# Patient Record
Sex: Female | Born: 1975 | Race: White | Hispanic: No | Marital: Single | Smoking: Never smoker
Health system: Southern US, Community
[De-identification: ages and names within clinical notes are randomized; demographics above are authoritative.]

## PROBLEM LIST (undated history)

## (undated) DIAGNOSIS — G43019 Migraine without aura, intractable, without status migrainosus: Secondary | ICD-10-CM

## (undated) DIAGNOSIS — F329 Major depressive disorder, single episode, unspecified: Secondary | ICD-10-CM

## (undated) DIAGNOSIS — G43909 Migraine, unspecified, not intractable, without status migrainosus: Secondary | ICD-10-CM

## (undated) DIAGNOSIS — E109 Type 1 diabetes mellitus without complications: Secondary | ICD-10-CM

## (undated) DIAGNOSIS — F32A Depression, unspecified: Secondary | ICD-10-CM

## (undated) DIAGNOSIS — N83209 Unspecified ovarian cyst, unspecified side: Secondary | ICD-10-CM

## (undated) DIAGNOSIS — A64 Unspecified sexually transmitted disease: Secondary | ICD-10-CM

## (undated) DIAGNOSIS — F419 Anxiety disorder, unspecified: Secondary | ICD-10-CM

## (undated) DIAGNOSIS — Z789 Other specified health status: Secondary | ICD-10-CM

## (undated) DIAGNOSIS — A419 Sepsis, unspecified organism: Secondary | ICD-10-CM

## (undated) DIAGNOSIS — G44001 Cluster headache syndrome, unspecified, intractable: Secondary | ICD-10-CM

## (undated) DIAGNOSIS — Z0282 Encounter for adoption services: Secondary | ICD-10-CM

## (undated) HISTORY — DX: Unspecified sexually transmitted disease: A64

## (undated) HISTORY — PX: OVARIAN CYST REMOVAL: SHX89

## (undated) HISTORY — DX: Migraine without aura, intractable, without status migrainosus: G43.019

## (undated) HISTORY — PX: BUNIONECTOMY: SHX129

## (undated) HISTORY — PX: EYE SURGERY: SHX253

## (undated) HISTORY — PX: CHOLECYSTECTOMY: SHX55

## (undated) HISTORY — PX: APPENDECTOMY: SHX54

---

## 1999-06-23 ENCOUNTER — Encounter: Admission: RE | Admit: 1999-06-23 | Discharge: 1999-09-21 | Payer: Self-pay | Admitting: Endocrinology

## 2001-08-26 ENCOUNTER — Emergency Department (HOSPITAL_COMMUNITY): Admission: EM | Admit: 2001-08-26 | Discharge: 2001-08-26 | Payer: Self-pay | Admitting: *Deleted

## 2005-09-22 ENCOUNTER — Emergency Department (HOSPITAL_COMMUNITY): Admission: EM | Admit: 2005-09-22 | Discharge: 2005-09-23 | Payer: Self-pay | Admitting: Emergency Medicine

## 2006-10-24 ENCOUNTER — Emergency Department (HOSPITAL_COMMUNITY): Admission: EM | Admit: 2006-10-24 | Discharge: 2006-10-24 | Payer: Self-pay | Admitting: Emergency Medicine

## 2006-10-28 ENCOUNTER — Inpatient Hospital Stay (HOSPITAL_COMMUNITY): Admission: AD | Admit: 2006-10-28 | Discharge: 2006-10-30 | Payer: Self-pay | Admitting: Gastroenterology

## 2011-11-30 DIAGNOSIS — R1031 Right lower quadrant pain: Secondary | ICD-10-CM | POA: Insufficient documentation

## 2011-12-17 ENCOUNTER — Emergency Department (HOSPITAL_COMMUNITY)
Admission: EM | Admit: 2011-12-17 | Discharge: 2011-12-18 | Disposition: A | Discharge status: Home / Self Care | Payer: Self-pay | Attending: Emergency Medicine | Admitting: Emergency Medicine

## 2011-12-17 ENCOUNTER — Emergency Department (HOSPITAL_COMMUNITY): Payer: Self-pay

## 2011-12-17 ENCOUNTER — Encounter (HOSPITAL_COMMUNITY): Payer: Self-pay | Admitting: *Deleted

## 2011-12-17 DIAGNOSIS — R10819 Abdominal tenderness, unspecified site: Secondary | ICD-10-CM | POA: Insufficient documentation

## 2011-12-17 DIAGNOSIS — Z794 Long term (current) use of insulin: Secondary | ICD-10-CM | POA: Insufficient documentation

## 2011-12-17 DIAGNOSIS — E119 Type 2 diabetes mellitus without complications: Secondary | ICD-10-CM | POA: Insufficient documentation

## 2011-12-17 DIAGNOSIS — R109 Unspecified abdominal pain: Secondary | ICD-10-CM | POA: Insufficient documentation

## 2011-12-17 DIAGNOSIS — N949 Unspecified condition associated with female genital organs and menstrual cycle: Secondary | ICD-10-CM | POA: Insufficient documentation

## 2011-12-17 HISTORY — DX: Major depressive disorder, single episode, unspecified: F32.9

## 2011-12-17 HISTORY — DX: Depression, unspecified: F32.A

## 2011-12-17 HISTORY — DX: Unspecified ovarian cyst, unspecified side: N83.209

## 2011-12-17 LAB — URINALYSIS, ROUTINE W REFLEX MICROSCOPIC
Nitrite: NEGATIVE
Specific Gravity, Urine: >1.046 — ABNORMAL HIGH (ref 1.005–1.030)
Urobilinogen, UA: 0.2 mg/dL (ref 0.0–1.0)

## 2011-12-17 LAB — CBC
MCH: 29.2 pg (ref 26.0–34.0)
Platelets: 387 10*3/uL (ref 150–400)
RBC: 4.9 MIL/uL (ref 3.87–5.11)
RDW: 13.1 % (ref 11.5–15.5)
WBC: 6.2 10*3/uL (ref 4.0–10.5)

## 2011-12-17 LAB — COMPREHENSIVE METABOLIC PANEL
ALT: 9 U/L (ref 0–35)
AST: 9 U/L (ref 0–37)
Albumin: 3.8 g/dL (ref 3.5–5.2)
CO2: 21 mEq/L (ref 19–32)
Calcium: 9.6 mg/dL (ref 8.4–10.5)
Chloride: 100 mEq/L (ref 96–112)
GFR calc non Af Amer: >90 mL/min (ref >90)
Sodium: 133 mEq/L — ABNORMAL LOW (ref 135–145)

## 2011-12-17 LAB — URINE MICROSCOPIC-ADD ON

## 2011-12-17 LAB — GLUCOSE, CAPILLARY: Glucose-Capillary: 359 mg/dL — ABNORMAL HIGH (ref 70–99)

## 2011-12-17 MED ORDER — MORPHINE SULFATE 4 MG/ML IJ SOLN
4.0000 mg | Freq: Once | INTRAMUSCULAR | Status: AC
  Administered 2011-12-17: 4 mg via INTRAVENOUS
  Filled 2011-12-17: qty 1

## 2011-12-17 MED ORDER — MORPHINE SULFATE 4 MG/ML IJ SOLN
4.0000 mg | Freq: Once | INTRAMUSCULAR | Status: AC
  Administered 2011-12-18: 4 mg via INTRAVENOUS
  Filled 2011-12-17: qty 1

## 2011-12-17 MED ORDER — SODIUM CHLORIDE 0.9 % IV BOLUS (SEPSIS)
500.0000 mL | Freq: Once | INTRAVENOUS | Status: AC
  Administered 2011-12-17: 500 mL via INTRAVENOUS

## 2011-12-17 MED ORDER — ONDANSETRON HCL 4 MG/2ML IJ SOLN
4.0000 mg | Freq: Once | INTRAMUSCULAR | Status: AC
  Administered 2011-12-17: 4 mg via INTRAVENOUS
  Filled 2011-12-17: qty 2

## 2011-12-17 NOTE — ED Notes (Signed)
Pt reports lower abd pain which started in May.  Reports being seen in [place] for same a week ago and was told that she had R ovarian cyst.  Pt reports pain started to move to the L last night.  Pt also reports nausea and very small amount of pinkish red vaginal bleeding.

## 2011-12-17 NOTE — ED Notes (Signed)
Pt stated she is a diabetic. Checked glucose, 359

## 2011-12-17 NOTE — ED Provider Notes (Signed)
History     CSN: [id number]  Arrival date & time 12/17/11  1621   First MD Initiated Contact with Patient 12/17/11 2049      Chief Complaint  Patient presents with  . Abdominal Pain    (Consider location/radiation/quality/duration/timing/severity/associated sxs/prior treatment) HPI  Patient presents to the ED with complaints of lower abdominal pain for which she has been seen multiple times for at new [facility] in [place]. Her pain has been persistent since May and she has been seen numerous time for this. I received her medical chart from the hospital which shows me she has had Ultrasounds and CT of abd/pelvis. Showing a possible ovarian cyst but no other acute abnormality that would cause patients pain. The chart also shows patient may have possible endometriosis. She denies her pain being different than her norm. She denies having diarrhea, nausea, vomiting. She does not have an appendix. She is afebriles and hemodynamically stable. The patient informs me that she is out of her pain medication therefore, her pain has been worse.  Past Medical History  Diagnosis Date  . Ovarian cyst   . Diabetes mellitus   . Depression     Past Surgical History  Procedure Date  . Appendectomy   . Cholecystectomy     No family history on file.  History  Substance Use Topics  . Smoking status: Never Smoker   . Smokeless tobacco: Not on file  . Alcohol Use: No    OB History    Grav Para Term Preterm Abortions TAB SAB Ect Mult Living                  Review of Systems   HEENT: denies blurry vision or change in hearing PULMONARY: Denies difficulty breathing and SOB CARDIAC: denies chest pain or heart palpitations MUSCULOSKELETAL:  denies being unable to ambulate ABDOMEN AL: denies N,V,D GU: denies loss of bowel or urinary control NEURO: denies numbness and tingling in extremities SKIN: no new rashes PSYCH: patient behavior is normal NECK: Not  complaining of neck pain     Allergies  Sulfa antibiotics; Wellbutrin; Amoxicillin; and Codeine  Home Medications   Current Outpatient Rx  Name Route Sig Dispense Refill  . INSULIN GLARGINE 100 UNIT/ML [place] SOLN Subcutaneous Inject 25 Units into the skin every morning.    . INSULIN LISPRO (HUMAN) 100 UNIT/ML [place] SOLN Subcutaneous Inject into the skin 3 (three) times daily before meals. Sliding scale.    Marland Kitchen SERTRALINE HCL 100 MG PO TABS Oral Take 100 mg by mouth daily.    Marland Kitchen ZOLPIDEM TARTRATE 10 MG PO TABS Oral Take 10 mg by mouth at bedtime as needed.    Marland Kitchen HYDROCODONE-ACETAMINOPHEN 5-500 MG PO TABS Oral Take 1 tablet by mouth every 6 (six) hours as needed for pain. 10 tablet 0  . ONDANSETRON HCL 4 MG PO TABS Oral Take 1 tablet (4 mg total) by mouth every 6 (six) hours. 12 tablet 0    BP 126/84  Pulse 108  Temp 98.7 F (37.1 C) (Oral)  Resp 20  SpO2 99%  LMP 12/03/2011  Physical Exam  Nursing note and vitals reviewed. Constitutional: She appears well-developed and well-nourished. No distress.  HENT:  Head: Normocephalic and atraumatic.  Eyes: Pupils are equal, round, and reactive to light.  Neck: Normal range of motion. Neck supple.  Cardiovascular: Normal rate and regular rhythm.   Pulmonary/Chest: Effort normal. No respiratory distress. She has no wheezes.  Abdominal: Soft. She exhibits  no distension. There is tenderness (bilateral lower quadrants). There is no rebound and no guarding.  Neurological: She is alert.  Skin: Skin is warm and dry.    ED Course  Procedures (including critical care time)  Labs Reviewed  URINALYSIS, ROUTINE W REFLEX MICROSCOPIC - Abnormal; Notable for the following:    APPearance CLOUDY (*)     Specific Gravity, Urine >1.046 (*)     Glucose, UA >1000 (*)     Ketones, ur TRACE (*)     All other components within normal limits  COMPREHENSIVE METABOLIC PANEL - Abnormal; Notable for the following:    Sodium 133 (*)     Glucose, Bld 304 (*)      All other components within normal limits  URINE MICROSCOPIC-ADD ON - Abnormal; Notable for the following:    Squamous Epithelial / LPF MANY (*)     Bacteria, UA FEW (*)     All other components within normal limits  GLUCOSE, CAPILLARY - Abnormal; Notable for the following:    Glucose-Capillary 359 (*)     All other components within normal limits  CBC  POCT PREGNANCY, URINE   US Transvaginal Non-ob  12/17/2011  *RADIOLOGY REPORT*  Clinical Data:  Severe pelvic pain.  Clinical suspicion for ovarian torsion.  LMP 12/05/2011.  TRANSABDOMINAL AND TRANSVAGINAL ULTRASOUND OF PELVIS DOPPLER ULTRASOUND OF OVARIES  Technique:  Both transabdominal and transvaginal ultrasound examinations of the pelvis were performed. Transabdominal technique was performed for global imaging of the pelvis including uterus, ovaries, adnexal regions, and pelvic cul-de-sac.  It was necessary to proceed with endovaginal exam following the transabdominal exam to visualize the endometrium and ovaries.  Color and duplex Doppler ultrasound was utilized to evaluate blood flow to the ovaries.  Comparison:  None.  Findings:  Uterus:  7.6 x 2.9 x 4.3 cm.  No fibroids or other uterine mass identified.  Endometrium:  Double layer endometrial thickness measures 8 mm transvaginally.  No focal lesion visualized.  Right ovary: 2.9 x 1.9 x 2.1 cm.  Normal appearance.  Left ovary: 4.1 x 1.6 x 2.5 cm.  Normal appearance.  Pulsed Doppler evaluation demonstrates normal low-resistance arterial and venous waveforms in both ovaries.  IMPRESSION: Normal exam.  No evidence of pelvic mass or other significant abnormality.  No sonographic evidence for ovarian torsion.  Original Report Authenticated By: Danae Orleans, M.D.   US Pelvis Complete  12/17/2011  *RADIOLOGY REPORT*  Clinical Data:  Severe pelvic pain.  Clinical suspicion for ovarian torsion.  LMP 12/05/2011.  TRANSABDOMINAL AND TRANSVAGINAL ULTRASOUND OF PELVIS DOPPLER ULTRASOUND OF OVARIES   Technique:  Both transabdominal and transvaginal ultrasound examinations of the pelvis were performed. Transabdominal technique was performed for global imaging of the pelvis including uterus, ovaries, adnexal regions, and pelvic cul-de-sac.  It was necessary to proceed with endovaginal exam following the transabdominal exam to visualize the endometrium and ovaries.  Color and duplex Doppler ultrasound was utilized to evaluate blood flow to the ovaries.  Comparison:  None.  Findings:  Uterus:  7.6 x 2.9 x 4.3 cm.  No fibroids or other uterine mass identified.  Endometrium:  Double layer endometrial thickness measures 8 mm transvaginally.  No focal lesion visualized.  Right ovary: 2.9 x 1.9 x 2.1 cm.  Normal appearance.  Left ovary: 4.1 x 1.6 x 2.5 cm.  Normal appearance.  Pulsed Doppler evaluation demonstrates normal low-resistance arterial and venous waveforms in both ovaries.  IMPRESSION: Normal exam.  No evidence of pelvic mass or other  significant abnormality.  No sonographic evidence for ovarian torsion.  Original Report Authenticated By: Danae Orleans, M.D.   [place] Art/ven Flow Abd Pelv Doppler  12/17/2011  *RADIOLOGY REPORT*  Clinical Data:  Severe pelvic pain.  Clinical suspicion for ovarian torsion.  LMP 12/05/2011.  TRANSABDOMINAL AND TRANSVAGINAL ULTRASOUND OF PELVIS DOPPLER ULTRASOUND OF OVARIES  Technique:  Both transabdominal and transvaginal ultrasound examinations of the pelvis were performed. Transabdominal technique was performed for global imaging of the pelvis including uterus, ovaries, adnexal regions, and pelvic cul-de-sac.  It was necessary to proceed with endovaginal exam following the transabdominal exam to visualize the endometrium and ovaries.  Color and duplex Doppler ultrasound was utilized to evaluate blood flow to the ovaries.  Comparison:  None.  Findings:  Uterus:  7.6 x 2.9 x 4.3 cm.  No fibroids or other uterine mass identified.  Endometrium:  Double layer endometrial thickness  measures 8 mm transvaginally.  No focal lesion visualized.  Right ovary: 2.9 x 1.9 x 2.1 cm.  Normal appearance.  Left ovary: 4.1 x 1.6 x 2.5 cm.  Normal appearance.  Pulsed Doppler evaluation demonstrates normal low-resistance arterial and venous waveforms in both ovaries.  IMPRESSION: Normal exam.  No evidence of pelvic mass or other significant abnormality.  No sonographic evidence for ovarian torsion.  Original Report Authenticated By: Danae Orleans, M.D.     1. Abdominal pain       MDM  Patients pain easily managed with 4mg  IV morphine IV. Patients ultasound today shows no cysts, no ovarian torsion. The patients pregnancy test is negative. GC chlamydia and wet prep not done because the hospital records sent to me show that her cultures are negative.  Pt has not had any nausea or vomiting prior to discharge. At re-evaluation of her abdomen she continues to be pain free and only mildly tender. Pt will be given Pain medications and antinausea medications as well as a referral to a gynecologist for her possible endometriosis.  I briefly discussed patient with Dr. Fonnie Jarvis who agrees with my plain.  Pt has been advised of the symptoms that warrant their return to the ED. Patient has voiced understanding and has agreed to follow-up with the PCP or specialist.          Dorthula Matas, PA 12/18/11 [phone]

## 2011-12-18 MED ORDER — ONDANSETRON HCL 4 MG PO TABS: 4.0000 mg | ORAL_TABLET | Freq: Four times a day (QID) | ORAL | Status: DC

## 2011-12-18 MED ORDER — ONDANSETRON HCL 4 MG PO TABS: 4.0000 mg | ORAL_TABLET | Freq: Four times a day (QID) | ORAL | Status: AC

## 2011-12-18 MED ORDER — HYDROCODONE-ACETAMINOPHEN 5-500 MG PO TABS: 1.0000 | ORAL_TABLET | Freq: Four times a day (QID) | ORAL | Status: DC | PRN

## 2011-12-18 NOTE — ED Provider Notes (Signed)
Medical screening examination/treatment/procedure(s) were performed by non-physician practitioner and as supervising physician I was immediately available for consultation/collaboration.   Hurman Horn, MD 12/18/11 [phone]

## 2011-12-29 ENCOUNTER — Emergency Department (HOSPITAL_COMMUNITY): Payer: Self-pay

## 2011-12-29 ENCOUNTER — Encounter (HOSPITAL_COMMUNITY): Payer: Self-pay | Admitting: Emergency Medicine

## 2011-12-29 ENCOUNTER — Emergency Department (HOSPITAL_COMMUNITY)
Admission: EM | Admit: 2011-12-29 | Discharge: 2011-12-29 | Disposition: A | Discharge status: Home / Self Care | Payer: Self-pay | Attending: Emergency Medicine | Admitting: Emergency Medicine

## 2011-12-29 DIAGNOSIS — R110 Nausea: Secondary | ICD-10-CM | POA: Insufficient documentation

## 2011-12-29 DIAGNOSIS — Z794 Long term (current) use of insulin: Secondary | ICD-10-CM | POA: Insufficient documentation

## 2011-12-29 DIAGNOSIS — F3289 Other specified depressive episodes: Secondary | ICD-10-CM | POA: Insufficient documentation

## 2011-12-29 DIAGNOSIS — R739 Hyperglycemia, unspecified: Secondary | ICD-10-CM

## 2011-12-29 DIAGNOSIS — E1169 Type 2 diabetes mellitus with other specified complication: Secondary | ICD-10-CM | POA: Insufficient documentation

## 2011-12-29 DIAGNOSIS — F329 Major depressive disorder, single episode, unspecified: Secondary | ICD-10-CM | POA: Insufficient documentation

## 2011-12-29 DIAGNOSIS — R0602 Shortness of breath: Secondary | ICD-10-CM | POA: Insufficient documentation

## 2011-12-29 LAB — CBC WITH DIFFERENTIAL/PLATELET
Basophils Absolute: 0 10*3/uL (ref 0.0–0.1)
Basophils Relative: 0 % (ref 0–1)
Eosinophils Absolute: 0.1 10*3/uL (ref 0.0–0.7)
MCH: 28.9 pg (ref 26.0–34.0)
MCHC: 35 g/dL (ref 30.0–36.0)
Neutro Abs: 3.5 10*3/uL (ref 1.7–7.7)
Neutrophils Relative %: 52 % (ref 43–77)
RDW: 13.1 % (ref 11.5–15.5)

## 2011-12-29 LAB — GLUCOSE, CAPILLARY: Glucose-Capillary: 221 mg/dL — ABNORMAL HIGH (ref 70–99)

## 2011-12-29 LAB — COMPREHENSIVE METABOLIC PANEL
ALT: 10 U/L (ref 0–35)
AST: 10 U/L (ref 0–37)
Calcium: 9.5 mg/dL (ref 8.4–10.5)
Creatinine, Ser: 0.47 mg/dL — ABNORMAL LOW (ref 0.50–1.10)
GFR calc Af Amer: >90 mL/min (ref >90)
GFR calc non Af Amer: >90 mL/min (ref >90)
Glucose, Bld: 118 mg/dL — ABNORMAL HIGH (ref 70–99)
Sodium: 133 mEq/L — ABNORMAL LOW (ref 135–145)
Total Protein: 7.5 g/dL (ref 6.0–8.3)

## 2011-12-29 LAB — URINALYSIS, ROUTINE W REFLEX MICROSCOPIC
Bilirubin Urine: NEGATIVE
Ketones, ur: 15 mg/dL — AB
Leukocytes, UA: NEGATIVE
Nitrite: NEGATIVE
Protein, ur: NEGATIVE mg/dL
Urobilinogen, UA: 0.2 mg/dL (ref 0.0–1.0)

## 2011-12-29 LAB — BLOOD GAS, ARTERIAL
Acid-base deficit: 5.2 mmol/L — ABNORMAL HIGH (ref 0.0–2.0)
FIO2: 0.28 %
Patient temperature: 98.6
TCO2: 16.7 mmol/L (ref 0–100)
pCO2 arterial: 33 mmHg — ABNORMAL LOW (ref 35.0–45.0)

## 2011-12-29 LAB — URINE MICROSCOPIC-ADD ON

## 2011-12-29 LAB — PREGNANCY, URINE: Preg Test, Ur: NEGATIVE

## 2011-12-29 MED ORDER — IOHEXOL 350 MG/ML SOLN
80.0000 mL | Freq: Once | INTRAVENOUS | Status: AC | PRN
Start: 2011-12-29 — End: 2011-12-29
  Administered 2011-12-29: 80 mL via INTRAVENOUS

## 2011-12-29 MED ORDER — IOHEXOL 300 MG/ML  SOLN: 80.0000 mL | Freq: Once | INTRAMUSCULAR | Status: DC | PRN

## 2011-12-29 MED ORDER — SODIUM CHLORIDE 0.9 % IV BOLUS (SEPSIS): 1000.0000 mL | Freq: Once | INTRAVENOUS | Status: DC

## 2011-12-29 MED ORDER — ONDANSETRON HCL 4 MG/2ML IJ SOLN
4.0000 mg | Freq: Once | INTRAMUSCULAR | Status: AC
  Administered 2011-12-29: 4 mg via INTRAVENOUS
  Filled 2011-12-29: qty 2

## 2011-12-29 MED ORDER — LORAZEPAM 2 MG/ML IJ SOLN
1.0000 mg | Freq: Once | INTRAMUSCULAR | Status: AC
  Administered 2011-12-29: 1 mg via INTRAVENOUS
  Filled 2011-12-29: qty 1

## 2011-12-29 MED ORDER — SODIUM CHLORIDE 0.9 % IV SOLN: Freq: Once | INTRAVENOUS | Status: DC

## 2011-12-29 MED ORDER — SODIUM CHLORIDE 0.9 % IV BOLUS (SEPSIS)
1000.0000 mL | Freq: Once | INTRAVENOUS | Status: AC
  Administered 2011-12-29: 1000 mL via INTRAVENOUS

## 2011-12-29 NOTE — ED Notes (Signed)
O2 removed

## 2011-12-29 NOTE — ED Provider Notes (Cosign Needed Addendum)
History     CSN: [id number]  Arrival date & time 12/29/11  6387   First MD Initiated Contact with Patient 12/29/11 [phone]      Chief Complaint  Patient presents with  . Shortness of Breath  . Nausea  . Hyperglycemia    (Consider location/radiation/quality/duration/timing/severity/associated sxs/prior treatment) Patient is a 36 y.o. female presenting with shortness of breath. The history is provided by the patient.  Shortness of Breath  Associated symptoms include shortness of breath.   patient here with nausea and vomiting and high blood sugar similar to what she's had DKA before in the past. Denies any fever, cough, shortness of breath. Notes compliance with her diabetic medications however she has not been checking her blood sugar because she doesn't have a machine. Denies any diarrhea or abdominal pain or chest pain but does note palpitations. No pleuritic chest pain noted. Nothing makes her symptoms better worse  Past Medical History  Diagnosis Date  . Ovarian cyst   . Diabetes mellitus   . Depression     Past Surgical History  Procedure Date  . Appendectomy   . Cholecystectomy     History reviewed. No pertinent family history.  History  Substance Use Topics  . Smoking status: Never Smoker   . Smokeless tobacco: Not on file  . Alcohol Use: No    OB History    Grav Para Term Preterm Abortions TAB SAB Ect Mult Living                  Review of Systems  Respiratory: Positive for shortness of breath.   All other systems reviewed and are negative.    Allergies  Sulfa antibiotics; Wellbutrin; Amoxicillin; and Codeine  Home Medications   Current Outpatient Rx  Name Route Sig Dispense Refill  . INSULIN GLARGINE 100 UNIT/ML [place] SOLN Subcutaneous Inject 25 Units into the skin every morning.    . INSULIN LISPRO (HUMAN) 100 UNIT/ML [place] SOLN Subcutaneous Inject into the skin 3 (three) times daily before meals. Sliding scale.    Marland Kitchen SERTRALINE HCL 100 MG PO TABS Oral  Take 100 mg by mouth daily.    Marland Kitchen ZOLPIDEM TARTRATE 10 MG PO TABS Oral Take 10 mg by mouth at bedtime as needed.      BP 112/70  Pulse 129  Temp 99 F (37.2 C)  Resp 18  SpO2 100%  LMP 12/03/2011  Physical Exam  Nursing note and vitals reviewed. Constitutional: She is oriented to person, place, and time. She appears well-developed and well-nourished.  Non-toxic appearance. No distress.  HENT:  Head: Normocephalic and atraumatic.  Eyes: Conjunctivae, EOM and lids are normal. Pupils are equal, round, and reactive to light.  Neck: Normal range of motion. Neck supple. No tracheal deviation present. No mass present.  Cardiovascular: Regular rhythm and normal heart sounds.  Tachycardia present.  Exam reveals no gallop.   No murmur heard. Pulmonary/Chest: Effort normal and breath sounds normal. No stridor. No respiratory distress. She has no decreased breath sounds. She has no wheezes. She has no rhonchi. She has no rales.  Abdominal: Soft. Normal appearance and bowel sounds are normal. She exhibits no distension. There is no tenderness. There is no rebound and no CVA tenderness.  Musculoskeletal: Normal range of motion. She exhibits no edema and no tenderness.  Neurological: She is alert and oriented to person, place, and time. She has normal strength. No cranial nerve deficit or sensory deficit. GCS eye subscore is 4. GCS verbal subscore is  5. GCS motor subscore is 6.  Skin: Skin is warm and dry. No abrasion and no rash noted.  Psychiatric: She has a normal mood and affect. Her speech is normal and behavior is normal.    ED Course  Procedures (including critical care time)  Labs Reviewed  GLUCOSE, CAPILLARY - Abnormal; Notable for the following:    Glucose-Capillary 221 (*)     All other components within normal limits  PREGNANCY, URINE  URINALYSIS, ROUTINE W REFLEX MICROSCOPIC  CBC WITH DIFFERENTIAL  LIPASE, BLOOD  COMPREHENSIVE METABOLIC PANEL  BLOOD GAS, VENOUS   No results  found.   No diagnosis found.    MDM  Patient given IV fluids and blood sugar has improved. Patient does appear to be very anxious and was given Ativan. She remained tachycardic or short of breath and had a CT of her chest which was negative for PE. Reexam patient does note that she has a lot of stress going on. She is stable for discharge at this time.     Date: 01/21/2012  Rate: 130  Rhythm: sinus tachycardia  QRS Axis: normal  Intervals: normal  ST/T Wave abnormalities: nonspecific ST changes  Conduction Disutrbances:none  Narrative Interpretation:   Old EKG Reviewed: none available      Toy Baker, MD 12/29/11 1312  Toy Baker, MD 01/21/12 [phone]

## 2011-12-29 NOTE — ED Notes (Signed)
Pt c/o SOB, nausea, and hyperglycemia. Pt states she woke up at 5am with nausea and SOB. Pt states she is coughing because of the nausea. "I feel like I need to vomit but nothing comes up". Pt also states "I feel like my heart is racing". Pt's CBG is 221 on assessment.

## 2011-12-29 NOTE — Discharge Instructions (Signed)
Hyperglycemia Hyperglycemia occurs when the glucose (sugar) in your blood is too high. Hyperglycemia can happen for many reasons, but it most often happens to people who do not know they have diabetes or are not managing their diabetes properly.  CAUSES  Whether you have diabetes or not, there are other causes of hyperglycemia. Hyperglycemia can occur when you have diabetes, but it can also occur in other situations that you might not be as aware of, such as: Diabetes  If you have diabetes and are having problems controlling your blood glucose, hyperglycemia could occur because of some of the following reasons:   Not following your meal plan.   Not taking your diabetes medications or not taking it properly.   Exercising less or doing less activity than you normally do.   Being sick.  Pre-diabetes  This cannot be ignored. Before people develop Type 2 diabetes, they almost always have "pre-diabetes." This is when your blood glucose levels are higher than normal, but not yet high enough to be diagnosed as diabetes. Research has shown that some long-term damage to the body, especially the heart and circulatory system, may already be occurring during pre-diabetes. If you take action to manage your blood glucose when you have pre-diabetes, you may delay or prevent Type 2 diabetes from developing.  Stress  If you have diabetes, you may be "diet" controlled or on oral medications or insulin to control your diabetes. However, you may find that your blood glucose is higher than usual in the hospital whether you have diabetes or not. This is often referred to as "stress hyperglycemia." Stress can elevate your blood glucose. This happens because of hormones put out by the body during times of stress. If stress has been the cause of your high blood glucose, it can be followed regularly by your caregiver. That way he/she can make sure your hyperglycemia does not continue to get worse or progress to diabetes.   Steroids  Steroids are medications that act on the infection fighting system (immune system) to block inflammation or infection. One side effect can be a rise in blood glucose. Most people can produce enough extra insulin to allow for this rise, but for those who cannot, steroids make blood glucose levels go even higher. It is not unusual for steroid treatments to "uncover" diabetes that is developing. It is not always possible to determine if the hyperglycemia will go away after the steroids are stopped. A special blood test called an A1c is sometimes done to determine if your blood glucose was elevated before the steroids were started.  SYMPTOMS  Thirsty.   Frequent urination.   Dry mouth.   Blurred vision.   Tired or fatigue.   Weakness.   Sleepy.   Tingling in feet or leg.  DIAGNOSIS  Diagnosis is made by monitoring blood glucose in one or all of the following ways:  A1c test. This is a chemical found in your blood.   Fingerstick blood glucose monitoring.   Laboratory results.  TREATMENT  First, knowing the cause of the hyperglycemia is important before the hyperglycemia can be treated. Treatment may include, but is not be limited to:  Education.   Change or adjustment in medications.   Change or adjustment in meal plan.   Treatment for an illness, infection, etc.   More frequent blood glucose monitoring.   Change in exercise plan.   Decreasing or stopping steroids.   Lifestyle changes.  HOME CARE INSTRUCTIONS   Test your blood glucose as  directed.   Exercise regularly. Your caregiver will give you instructions about exercise. Pre-diabetes or diabetes which comes on with stress is helped by exercising.   Eat wholesome, balanced meals. Eat often and at regular, fixed times. Your caregiver or nutritionist will give you a meal plan to guide your sugar intake.   Being at an ideal weight is important. If needed, losing as little as 10 to 15 pounds may help  improve blood glucose levels.  SEEK MEDICAL CARE IF:   You have questions about medicine, activity, or diet.   You continue to have symptoms (problems such as increased thirst, urination, or weight gain).  SEEK IMMEDIATE MEDICAL CARE IF:   You are vomiting or have diarrhea.   Your breath smells fruity.   You are breathing faster or slower.   You are very sleepy or incoherent.   You have numbness, tingling, or pain in your feet or hands.   You have chest pain.   Your symptoms get worse even though you have been following your caregiver's orders.   If you have any other questions or concerns.  Document Released: 12/12/2000 Document Revised: 06/07/2011 Document Reviewed: 02/07/2009 [facility] Patient Information 2012 [place], [place].RESOURCE GUIDE  Chronic Pain Problems: Contact [facility]  [phone] Patients need to be referred by their primary care doctor.  Insufficient Money for Medicine: Contact United Way:  call "211" or [facility] (430)312-8244.  No Primary Care Doctor: - Call Health Connect  [phone] - can help you locate a primary care doctor that  accepts your insurance, provides certain services, etc. - Physician Referral [phone]  Agencies that provide inexpensive medical care: - [facility]  [phone] - [facility]  [phone] - [facility]  [phone] [facility]  [phone] - Planned Parenthood  [phone] [facility]  [phone]  Medicaid-accepting [facility] Providers: - [facility]- [address]  [phone], Mon-Fri 9am-7pm, Sat 9am-1pm - [facility]- [address] [place], [address] [place]  [phone] - [facility]- [address], [address] [place]  [phone] [facility]- [address]  [phone] - Renaye Rakers- [address] [place], [address], [phone]  Only accepts  [place] patients after they have their name  applied to their card  Self Pay (no insurance) in [place]: - Sickle Cell Patients: Dr Willey Blade, [facility]  [address] [place], [phone] - [facility]- [address] [place]  [phone]       [facility] [employer]- [address]       -     [facility]- see information above (Speak to [employer] if you do not have insurance)       -  [facility]- [address] [place], [phone]       -  [facility]- [address],  [phone]       -  [facility]- [address] [phone]       -  Dr Julio Sicks-  [address], [place], [phone]       -  [facility]- [address] [phone]       -  [facility]- [address], [phone], also [address]  [address] [phone]       -    [facility]- [address] [place], [phone], 1st & 3rd Saturday   every month, 10am-1pm  1) Find a Doctor and Pay Out of Pocket Although you won't have to find out who is covered by your insurance plan, it is a good idea to ask around and get recommendations. You will then need to call the office and see if the doctor you have chosen will accept you as a new patient and what types of options they offer for patients who are self-pay. Some doctors offer discounts or will set up payment plans for their patients who do not have insurance, but you will need to ask so you aren't surprised when you get to your appointment.  2) Contact Your Local Health Department Not all health departments have doctors that can see patients for sick visits, but many do, so it is worth a call to see if yours does. If you don't know where your local health department is, you can check in your phone book. The CDC also has a tool to help you locate your state's health department, and many state websites also have listings of all of their  local health departments.  3) Find a Walk-in Clinic If your illness is not likely to be very severe or complicated, you may want to try a walk in clinic. These are popping up all over the country in pharmacies, drugstores, and shopping centers. They're usually staffed by nurse practitioners or physician assistants that have been trained to treat common illnesses and complaints. They're usually fairly quick and inexpensive. However, if you have serious medical issues or chronic medical problems, these are probably not your best option  STD Testing - [facility] Department of [facility] [place], STD Clinic, [address] [place], phone [phone] or [phone].  Monday - Friday, call for an appointment. [facility] Department of [employer], STD Clinic, [address], [place], phone [phone] or [phone].  Monday - Friday, call for an appointment.  Abuse/Neglect: [facility] Child Abuse Hotline 703-585-7486 [facility] Child Abuse Hotline (504)841-1068 (After Hours)  Emergency Shelter:  [facility] 857-834-1959  Maternity Homes: - [facility] [facility] (318) 272-8768 - [facility] [phone]  MRSA Hotline #:   (423)582-5908  [facility] Resources  [facility] [place]  United [employer] [facility] Dept. [address]                 [address]         [address] [place] [address]  [place]                                               Cristobal Goldmann Phone:  [phone]                                  Phone:  [phone]                   Phone:  [phone]  [facility], [phone] - [facility] - [phone]       -     [facility] in [place], [address]                                  [phone], [facility] Child Abuse  Hotline (651)373-0191 or 509-267-6232 (After Hours)   Behavioral Health Services  Substance Abuse Resources: - [facility]  (808)730-2414 - [facility] [phone] - [facility] [place] (905)794-7865 Floydene Flock (949)490-9171 - Residential & Outpatient Substance Abuse Program  [phone]  Psychological Services: [facility]  ([phone] [facility]  [phone] - [facility], [phone] [place]. [address], [place], ACCESS LINE: (657)270-2913 or 934-006-9044, [employer]  Dental Assistance  If unable to pay or uninsured, contact:  [facility] or [facility]. to become qualified for the adult dental clinic.  Patients with Medicaid: [facility] [phone] [address], [phone] [address] [phone]  If unable to pay, or uninsured, contact [facility] (581)872-2402) or [facility] Department [phone] in [place], [phone] in [facility]) to become qualified for the adult dental clinic  Other Low-Cost Community Dental Services: - [facility]- [address] [place], [place], [place], [address], [phone], Ext. [phone], 2nd and 4th Thursday of the month at 6:30am.  10 clients each day by appointment, can sometimes see walk-in patients if someone does not show for an appointment. [facility]- [address] Ether Griffins [place], [place], [address], [phone] - [facility]- [address] [place] [address] [place], [place], [address], [phone] - [facility]- [phone] [facility] Health Department- [phone] [facility] Department- [phone]

## 2012-01-25 ENCOUNTER — Encounter (HOSPITAL_COMMUNITY): Payer: Self-pay | Admitting: *Deleted

## 2012-01-25 ENCOUNTER — Emergency Department (HOSPITAL_COMMUNITY)
Admission: EM | Admit: 2012-01-25 | Discharge: 2012-01-25 | Discharge status: Against Medical Advice | Payer: Self-pay | Attending: Emergency Medicine | Admitting: Emergency Medicine

## 2012-01-25 DIAGNOSIS — R1110 Vomiting, unspecified: Secondary | ICD-10-CM | POA: Insufficient documentation

## 2012-01-25 LAB — CBC WITH DIFFERENTIAL/PLATELET
Basophils Absolute: 0.1 10*3/uL (ref 0.0–0.1)
Basophils Relative: 2 % — ABNORMAL HIGH (ref 0–1)
Eosinophils Absolute: 0.2 10*3/uL (ref 0.0–0.7)
HCT: 43.3 % (ref 36.0–46.0)
Hemoglobin: 15 g/dL (ref 12.0–15.0)
Lymphs Abs: 2.6 10*3/uL (ref 0.7–4.0)
MCH: 29.9 pg (ref 26.0–34.0)
MCHC: 34.6 g/dL (ref 30.0–36.0)
MCV: 86.3 fL (ref 78.0–100.0)
Monocytes Absolute: 0.6 10*3/uL (ref 0.1–1.0)
Neutro Abs: 3.8 10*3/uL (ref 1.7–7.7)
RDW: 13.4 % (ref 11.5–15.5)

## 2012-01-25 LAB — COMPREHENSIVE METABOLIC PANEL
Albumin: 3.5 g/dL (ref 3.5–5.2)
Alkaline Phosphatase: 88 U/L (ref 39–117)
BUN: 21 mg/dL (ref 6–23)
Creatinine, Ser: 0.52 mg/dL (ref 0.50–1.10)
GFR calc Af Amer: >90 mL/min (ref >90)
Glucose, Bld: 274 mg/dL — ABNORMAL HIGH (ref 70–99)
Potassium: 3.6 mEq/L (ref 3.5–5.1)
Total Protein: 7 g/dL (ref 6.0–8.3)

## 2012-01-25 LAB — GLUCOSE, CAPILLARY: Glucose-Capillary: 265 mg/dL — ABNORMAL HIGH (ref 70–99)

## 2012-01-25 NOTE — ED Notes (Signed)
Pt c/o vomiting x 1 hour; minimal abd pain

## 2012-02-03 ENCOUNTER — Emergency Department (HOSPITAL_COMMUNITY): Payer: Self-pay

## 2012-02-03 ENCOUNTER — Emergency Department (HOSPITAL_COMMUNITY)
Admission: EM | Admit: 2012-02-03 | Discharge: 2012-02-04 | Disposition: A | Discharge status: Home / Self Care | Payer: Self-pay | Attending: Emergency Medicine | Admitting: Emergency Medicine

## 2012-02-03 ENCOUNTER — Encounter (HOSPITAL_COMMUNITY): Payer: Self-pay | Admitting: Emergency Medicine

## 2012-02-03 DIAGNOSIS — F3289 Other specified depressive episodes: Secondary | ICD-10-CM | POA: Insufficient documentation

## 2012-02-03 DIAGNOSIS — K5289 Other specified noninfective gastroenteritis and colitis: Secondary | ICD-10-CM | POA: Insufficient documentation

## 2012-02-03 DIAGNOSIS — K529 Noninfective gastroenteritis and colitis, unspecified: Secondary | ICD-10-CM

## 2012-02-03 DIAGNOSIS — Z794 Long term (current) use of insulin: Secondary | ICD-10-CM | POA: Insufficient documentation

## 2012-02-03 DIAGNOSIS — F329 Major depressive disorder, single episode, unspecified: Secondary | ICD-10-CM | POA: Insufficient documentation

## 2012-02-03 DIAGNOSIS — E119 Type 2 diabetes mellitus without complications: Secondary | ICD-10-CM | POA: Insufficient documentation

## 2012-02-03 DIAGNOSIS — Z9089 Acquired absence of other organs: Secondary | ICD-10-CM | POA: Insufficient documentation

## 2012-02-03 LAB — GLUCOSE, CAPILLARY: Glucose-Capillary: 228 mg/dL — ABNORMAL HIGH (ref 70–99)

## 2012-02-03 LAB — CBC
HCT: 40.1 % (ref 36.0–46.0)
Hemoglobin: 13.6 g/dL (ref 12.0–15.0)
MCH: 28.7 pg (ref 26.0–34.0)
MCHC: 33.9 g/dL (ref 30.0–36.0)

## 2012-02-03 LAB — POCT I-STAT, CHEM 8
BUN: 15 mg/dL (ref 6–23)
Creatinine, Ser: 0.6 mg/dL (ref 0.50–1.10)
Sodium: 134 mEq/L — ABNORMAL LOW (ref 135–145)
TCO2: 20 mmol/L (ref 0–100)

## 2012-02-03 LAB — BLOOD GAS, VENOUS
Acid-base deficit: 0.8 mmol/L (ref 0.0–2.0)
Patient temperature: 98.6
TCO2: 19.8 mmol/L (ref 0–100)
pCO2, Ven: 35.1 mmHg — ABNORMAL LOW (ref 45.0–50.0)

## 2012-02-03 MED ORDER — ONDANSETRON 8 MG PO TBDP
ORAL_TABLET | ORAL | Status: AC
  Administered 2012-02-03: 22:00:00
  Filled 2012-02-03: qty 1

## 2012-02-03 MED ORDER — ONDANSETRON HCL 4 MG/2ML IJ SOLN
4.0000 mg | Freq: Once | INTRAMUSCULAR | Status: AC
  Administered 2012-02-03: 4 mg via INTRAVENOUS
  Filled 2012-02-03: qty 2

## 2012-02-03 MED ORDER — ONDANSETRON 8 MG PO TBDP
8.0000 mg | ORAL_TABLET | Freq: Once | ORAL | Status: AC
  Administered 2012-02-03: 8 mg via ORAL

## 2012-02-03 MED ORDER — SODIUM CHLORIDE 0.9 % IV BOLUS (SEPSIS)
2000.0000 mL | Freq: Once | INTRAVENOUS | Status: AC
  Administered 2012-02-03: 2000 mL via INTRAVENOUS

## 2012-02-03 NOTE — ED Notes (Signed)
Pt c/o vomiting and diarrhea, CBG at home over 500 x 24 hours

## 2012-02-03 NOTE — ED Notes (Signed)
MD at bedside. 

## 2012-02-03 NOTE — ED Notes (Signed)
Pt c/o N/V x7 since 1100 this morning. Denies new meds and food. Pt reports having menstruation since Aug. 2, 2013. Pt reports right ovarian cyst. Hx of DM. Emesis is liquid since she has only been able to take water and tea. Pt reports diarrhea since 1000 today x 5. Dark brown loose stools.  Denies blood in emesis and stools.

## 2012-02-03 NOTE — ED Notes (Signed)
Patient transported to X-ray 

## 2012-02-03 NOTE — ED Provider Notes (Signed)
History     CSN: [id number]  Arrival date & time 02/03/12  1955   First MD Initiated Contact with Patient 02/03/12 2014      Chief Complaint  Patient presents with  . Emesis    (Consider location/radiation/quality/duration/timing/severity/associated sxs/prior treatment) HPI Complains of vomiting and diarrhea onset yesterday. 5 or 6 episodes of vomiting and 5 or 6 episodes of diarrhea no fever complains of mild right-sided abdominal pain since May 2013 which she reports is constant resulting from a node bearing cyst. No fever no blood parenchyma no hematemesis no treatment prior to coming here nothing makes symptoms better or worse no other associated symptoms Past Medical History  Diagnosis Date  . Ovarian cyst   . Diabetes mellitus   . Depression     Past Surgical History  Procedure Date  . Appendectomy   . Cholecystectomy   . Ovarian cyst removal     No family history on file.  History  Substance Use Topics  . Smoking status: Never Smoker   . Smokeless tobacco: Not on file  . Alcohol Use: No    OB History    Grav Para Term Preterm Abortions TAB SAB Ect Mult Living                  Review of Systems  Constitutional: Negative.   HENT: Negative.   Respiratory: Negative.   Cardiovascular: Negative.   Gastrointestinal: Positive for nausea, vomiting, abdominal pain and diarrhea.  Musculoskeletal: Negative.   Skin: Negative.   Neurological: Negative.   Hematological: Negative.   Psychiatric/Behavioral: Negative.     Allergies  Sulfa antibiotics; Wellbutrin; Amoxicillin; and Codeine  Home Medications   Current Outpatient Rx  Name Route Sig Dispense Refill  . INSULIN GLARGINE 100 UNIT/ML [place] SOLN Subcutaneous Inject 30 Units into the skin daily.     . INSULIN LISPRO (HUMAN) 100 UNIT/ML [place] SOLN Subcutaneous Inject 1-8 Units into the skin 3 (three) times daily before meals. Sliding scale.    Marland Kitchen ZOLPIDEM TARTRATE 5 MG PO TABS Oral Take 5 mg by mouth at bedtime  as needed. For insomnia      BP 141/85  Pulse 138  Temp 99 F (37.2 C) (Oral)  Resp 20  Ht 5\' 9"  (1.753 m)  Wt 160 lb (72.576 kg)  BMI 23.63 kg/m2  SpO2 99%  LMP 01/28/2012  Physical Exam  Nursing note and vitals reviewed. Constitutional: She appears well-developed and well-nourished.  HENT:  Head: Normocephalic and atraumatic.       Mucous membranes dry  Eyes: Conjunctivae are normal. Pupils are equal, round, and reactive to light.  Neck: Neck supple. No tracheal deviation present. No thyromegaly present.  Cardiovascular: Regular rhythm.   No murmur heard.      Tachycardic  Pulmonary/Chest: Effort normal and breath sounds normal.  Abdominal: Soft. Bowel sounds are normal. She exhibits no distension. There is no tenderness.  Musculoskeletal: Normal range of motion. She exhibits no edema and no tenderness.  Neurological: She is alert. Coordination normal.  Skin: Skin is warm and dry. No rash noted.  Psychiatric: She has a normal mood and affect.    ED Course  Procedures (including critical care time)  Labs Reviewed  GLUCOSE, CAPILLARY - Abnormal; Notable for the following:    Glucose-Capillary 296 (*)     All other components within normal limits  CBC  BLOOD GAS, VENOUS  PREGNANCY, URINE   No results found.   No diagnosis found. X-rays reviewed by me  11:40 PM feels much improved after treatment with intravenous fluids and Zofran. She is able to drink water without vomiting. MDM  No evidence of DKA. Plan prescription Zofran, Imodium as needed for diarrhea Encourage oral hydration. Referral resource guide so patient can get new primary care physician Diagnosis #1 gastroenteritis  #2 hyperglycemia #3 dehydration   Is a  Doug Sou, MD 02/03/12 2355

## 2012-02-04 MED ORDER — ONDANSETRON HCL 4 MG PO TABS: 8.0000 mg | ORAL_TABLET | Freq: Four times a day (QID) | ORAL | Status: DC

## 2012-02-11 ENCOUNTER — Encounter (HOSPITAL_COMMUNITY): Payer: Self-pay | Admitting: Emergency Medicine

## 2012-02-11 ENCOUNTER — Emergency Department (HOSPITAL_COMMUNITY)
Admission: EM | Admit: 2012-02-11 | Discharge: 2012-02-11 | Disposition: A | Discharge status: Home / Self Care | Payer: Self-pay | Attending: Emergency Medicine | Admitting: Emergency Medicine

## 2012-02-11 DIAGNOSIS — E871 Hypo-osmolality and hyponatremia: Secondary | ICD-10-CM | POA: Insufficient documentation

## 2012-02-11 DIAGNOSIS — R739 Hyperglycemia, unspecified: Secondary | ICD-10-CM

## 2012-02-11 DIAGNOSIS — R112 Nausea with vomiting, unspecified: Secondary | ICD-10-CM | POA: Insufficient documentation

## 2012-02-11 DIAGNOSIS — E109 Type 1 diabetes mellitus without complications: Secondary | ICD-10-CM | POA: Insufficient documentation

## 2012-02-11 DIAGNOSIS — R197 Diarrhea, unspecified: Secondary | ICD-10-CM

## 2012-02-11 DIAGNOSIS — Z794 Long term (current) use of insulin: Secondary | ICD-10-CM | POA: Insufficient documentation

## 2012-02-11 DIAGNOSIS — E860 Dehydration: Secondary | ICD-10-CM

## 2012-02-11 DIAGNOSIS — E878 Other disorders of electrolyte and fluid balance, not elsewhere classified: Secondary | ICD-10-CM

## 2012-02-11 LAB — COMPREHENSIVE METABOLIC PANEL
ALT: 9 U/L (ref 0–35)
AST: 11 U/L (ref 0–37)
Albumin: 3.4 g/dL — ABNORMAL LOW (ref 3.5–5.2)
Alkaline Phosphatase: 81 U/L (ref 39–117)
Chloride: 91 mEq/L — ABNORMAL LOW (ref 96–112)
Potassium: 4.1 mEq/L (ref 3.5–5.1)
Sodium: 128 mEq/L — ABNORMAL LOW (ref 135–145)
Total Bilirubin: 0.2 mg/dL — ABNORMAL LOW (ref 0.3–1.2)

## 2012-02-11 LAB — URINE MICROSCOPIC-ADD ON

## 2012-02-11 LAB — URINALYSIS, ROUTINE W REFLEX MICROSCOPIC
Glucose, UA: >1000 mg/dL — AB
Hgb urine dipstick: NEGATIVE
Ketones, ur: 40 mg/dL — AB
Protein, ur: NEGATIVE mg/dL
pH: 6 (ref 5.0–8.0)

## 2012-02-11 LAB — CBC
HCT: 39.8 % (ref 36.0–46.0)
Platelets: 347 10*3/uL (ref 150–400)
RDW: 12.9 % (ref 11.5–15.5)
WBC: 9.8 10*3/uL (ref 4.0–10.5)

## 2012-02-11 LAB — GLUCOSE, CAPILLARY
Glucose-Capillary: 521 mg/dL — ABNORMAL HIGH (ref 70–99)
Glucose-Capillary: 280 mg/dL — ABNORMAL HIGH (ref 70–99)

## 2012-02-11 MED ORDER — ONDANSETRON HCL 4 MG/2ML IJ SOLN
4.0000 mg | INTRAMUSCULAR | Status: DC | PRN
  Administered 2012-02-11: 4 mg via INTRAVENOUS
  Filled 2012-02-11: qty 2

## 2012-02-11 MED ORDER — INSULIN REGULAR HUMAN 100 UNIT/ML IJ SOLN: 3.0000 [IU] | Freq: Once | INTRAMUSCULAR | Status: DC

## 2012-02-11 MED ORDER — SODIUM CHLORIDE 0.9 % IV SOLN
1000.0000 mL | Freq: Once | INTRAVENOUS | Status: AC
  Administered 2012-02-11: 1000 mL via INTRAVENOUS

## 2012-02-11 MED ORDER — SODIUM CHLORIDE 0.9 % IV BOLUS (SEPSIS)
1000.0000 mL | Freq: Once | INTRAVENOUS | Status: AC
  Administered 2012-02-11: 1000 mL via INTRAVENOUS

## 2012-02-11 MED ORDER — SODIUM CHLORIDE 0.9 % IV SOLN
1000.0000 mL | INTRAVENOUS | Status: DC
  Administered 2012-02-11: 1000 mL via INTRAVENOUS

## 2012-02-11 MED ORDER — INSULIN ASPART 100 UNIT/ML ~~LOC~~ SOLN
4.0000 [IU] | Freq: Once | SUBCUTANEOUS | Status: AC
  Administered 2012-02-11: 4 [IU] via SUBCUTANEOUS
  Filled 2012-02-11: qty 1

## 2012-02-11 MED ORDER — PROMETHAZINE HCL 25 MG RE SUPP: RECTAL | Status: DC

## 2012-02-11 MED ORDER — INSULIN ASPART 100 UNIT/ML ~~LOC~~ SOLN
3.0000 [IU] | Freq: Once | SUBCUTANEOUS | Status: AC
  Administered 2012-02-11: 3 [IU] via SUBCUTANEOUS
  Filled 2012-02-11: qty 1

## 2012-02-11 NOTE — ED Notes (Signed)
Pt states that for 2 days now she has had critical high cbg today it was 486. N/v not feeling well. Denies any pain, alert x4 small amount of emesis today. Hx diabetic, took lantus pta

## 2012-02-11 NOTE — ED Provider Notes (Signed)
History     CSN: [id number]  Arrival date & time 02/11/12  1350   First MD Initiated Contact with Patient 02/11/12 1459      Chief Complaint  Patient presents with  . Hyperglycemia  . 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 She relates over the past 2 days her blood glucose has been getting high higher although it has been getting progressively higher over the last 2 weeks. She states she is following her diet and taking her medications. She has had nausea and vomiting 2 times a day the past 2 days and she had 2-3 episodes of watery diarrhea yesterday. She states she had abdominal pain yesterday that she relates it to an ovarian cyst problem. She denies cough, rhinorrhea, sneezing, sore throat, chest pain, shortness of breath cough dysuria. She does state she has frequency and thirst.  PCP no local  Past Medical History  Diagnosis Date  . Ovarian cyst   . Diabetes mellitus   . Depression     Past Surgical History  Procedure Date  . Appendectomy   . Cholecystectomy   . Ovarian cyst removal     No family history on file.  History  Substance Use Topics  . Smoking status: Never Smoker   . Smokeless tobacco: Not on file  . Alcohol Use: No  moved to [place]  3 weeks ago from [facility] [employer]  OB History    Grav Para Term Preterm Abortions TAB SAB Ect Mult Living                  Review of Systems  All other systems reviewed and are negative.    Allergies  Sulfa antibiotics; Wellbutrin; Amoxicillin; and Codeine  Home Medications   Current Outpatient Rx  Name Route Sig Dispense Refill  . INSULIN GLARGINE 100 UNIT/ML [place] SOLN Subcutaneous Inject 30 Units into the skin daily.     . INSULIN LISPRO (HUMAN) 100 UNIT/ML [place] SOLN Subcutaneous Inject 0-8 Units into the skin 3 (three) times daily before meals. Sliding  scale.    Marland Kitchen ONDANSETRON HCL 4 MG PO TABS Oral Take 8 mg by mouth every 8 (eight) hours as needed. For nausea.    Marland Kitchen ZOLPIDEM TARTRATE 5 MG PO TABS Oral Take 5 mg by mouth at bedtime as needed. For insomnia      BP 127/81  Pulse 100  Temp 99 F (37.2 C) (Oral)  Resp 24  SpO2 100%  LMP 01/28/2012  Vital signs normal    Physical Exam  Nursing note and vitals reviewed. Constitutional: She is oriented to person, place, and time. She appears well-developed and well-nourished.  Non-toxic appearance. She does not appear ill. No distress.  HENT:  Head: Normocephalic and atraumatic.  Right Ear: External ear normal.  Left Ear: External ear normal.  Nose: Nose normal. No mucosal edema or rhinorrhea.  Mouth/Throat: Mucous membranes are normal. No dental abscesses or uvula swelling.       Dry mucous membranes  Eyes: Conjunctivae and EOM are normal. Pupils are equal, round, and reactive to light.  Neck: Normal range of motion and full passive range of motion without pain. Neck supple.  Cardiovascular: Normal rate, regular rhythm and normal heart sounds.  Exam reveals no gallop and no friction rub.   No murmur heard. Pulmonary/Chest: Effort normal and breath sounds normal. No respiratory distress. She has no wheezes. She  has no rhonchi. She has no rales. She exhibits no tenderness and no crepitus.  Abdominal: Soft. Normal appearance and bowel sounds are normal. She exhibits no distension. There is no tenderness. There is no rebound and no guarding.  Musculoskeletal: Normal range of motion. She exhibits no edema and no tenderness.       Moves all extremities well.   Neurological: She is alert and oriented to person, place, and time. She has normal strength. No cranial nerve deficit.  Skin: Skin is warm, dry and intact. No rash noted. No erythema. No pallor.  Psychiatric: Her speech is normal and behavior is normal. Her mood appears not anxious.       Flat affect    ED Course  Procedures  (including critical care time)   Medications  ondansetron (ZOFRAN) 4 MG tablet (not administered)  0.9 %  sodium chloride infusion (1000 mL Intravenous New Bag/Given 02/11/12 1500)    Followed by  0.9 %  sodium chloride infusion (1000 mL Intravenous New Bag/Given 02/11/12 1549)  ondansetron (ZOFRAN) injection 4 mg (4 mg Intravenous Given 02/11/12 1526)  promethazine (PHENERGAN) 25 MG suppository (not administered)  insulin aspart (novoLOG) injection 4 Units (4 Units Subcutaneous Given 02/11/12 1552)  sodium chloride 0.9 % bolus 1,000 mL (1000 mL Intravenous Given 02/11/12 1605)  insulin aspart (novoLOG) injection 3 Units (3 Units Subcutaneous Given 02/11/12 1744)   Patient given IV fluids and IV insulin. She was given Zofran for her nausea.  Recheck at 1645 patient states she's feeling better. Her CBG improved in 397. She was given a second bolus of regular insulin IV and more fluids.  Recheck at 18 and patient feeling better. Her CBG is now 280. She feels she can go home now and manage her diabetes on her own. Patient requesting work note for the next couple days to get her diabetes under control.  Results for orders placed during the hospital encounter of 02/11/12  GLUCOSE, CAPILLARY      Component Value Range   Glucose-Capillary 521 (*) 70 - 99 mg/dL  CBC      Component Value Range   WBC 9.8  4.0 - 10.5 K/uL   RBC 4.60  3.87 - 5.11 MIL/uL   Hemoglobin 13.3  12.0 - 15.0 g/dL   HCT 16.1  09.6 - 04.5 %   MCV 86.5  78.0 - 100.0 fL   MCH 28.9  26.0 - 34.0 pg   MCHC 33.4  30.0 - 36.0 g/dL   RDW 40.9  81.1 - 91.4 %   Platelets 347  150 - 400 K/uL  COMPREHENSIVE METABOLIC PANEL      Component Value Range   Sodium 128 (*) 135 - 145 mEq/L   Potassium 4.1  3.5 - 5.1 mEq/L   Chloride 91 (*) 96 - 112 mEq/L   CO2 23  19 - 32 mEq/L   Glucose, Bld 521 (*) 70 - 99 mg/dL   BUN 12  6 - 23 mg/dL   Creatinine, Ser 7.82  0.50 - 1.10 mg/dL   Calcium 9.3  8.4 - 95.6 mg/dL   Total Protein 6.8  6.0 -  8.3 g/dL   Albumin 3.4 (*) 3.5 - 5.2 g/dL   AST 11  0 - 37 U/L   ALT 9  0 - 35 U/L   Alkaline Phosphatase 81  39 - 117 U/L   Total Bilirubin 0.2 (*) 0.3 - 1.2 mg/dL   GFR calc non Af Amer >90  >90 mL/min  GFR calc Af Amer >90  >90 mL/min  URINALYSIS, ROUTINE W REFLEX MICROSCOPIC      Component Value Range   Color, Urine YELLOW  YELLOW   APPearance CLEAR  CLEAR   Specific Gravity, Urine 1.034 (*) 1.005 - 1.030   pH 6.0  5.0 - 8.0   Glucose, UA >1000 (*) NEGATIVE mg/dL   Hgb urine dipstick NEGATIVE  NEGATIVE   Bilirubin Urine NEGATIVE  NEGATIVE   Ketones, ur 40 (*) NEGATIVE mg/dL   Protein, ur NEGATIVE  NEGATIVE mg/dL   Urobilinogen, UA 0.2  0.0 - 1.0 mg/dL   Nitrite NEGATIVE  NEGATIVE   Leukocytes, UA NEGATIVE  NEGATIVE  GLUCOSE, CAPILLARY      Component Value Range   Glucose-Capillary 397 (*) 70 - 99 mg/dL  PREGNANCY, URINE      Component Value Range   Preg Test, Ur NEGATIVE  NEGATIVE  URINE MICROSCOPIC-ADD ON      Component Value Range   Squamous Epithelial / LPF FEW (*) RARE   WBC, UA 3-6  <3 WBC/hpf   Bacteria, UA FEW (*) RARE  GLUCOSE, CAPILLARY      Component Value Range   Glucose-Capillary 280 (*) 70 - 99 mg/dL   Laboratory interpretation all normal except initial hyperglycemia, hyponatremia and low chloride with elevated hemoglobin consistent with dehydration   Dg Abd Acute W/chest  02/03/2012  *RADIOLOGY REPORT*  Clinical Data: Abdominal pain, nausea, vomiting  ACUTE ABDOMEN SERIES (ABDOMEN 2 VIEW & CHEST 1 VIEW)  Comparison: 10/24/2006  Findings: Normal heart size and vascularity.  Negative for pneumonia, collapse, consolidation, effusion or pneumothorax. Trachea midline.  No free air evident.  Scattered air and stool throughout the bowel.  No significant dilatation or obstruction.  Postop changes right lower quadrant, suspect prior bowel surgery or appendectomy.  Pelvic calcifications consistent with venous phleboliths.  No acute osseous finding.  IMPRESSION: No  acute findings in the chest or abdomen by plain radiography  Original Report Authenticated By: Judie Petit. Ruel Favors, M.D.     1. Hyperglycemia   2. Hyponatremia   3. Chloride, decreased level   4. Dehydration   5. Vomiting and diarrhea     New Prescriptions   PROMETHAZINE (PHENERGAN) 25 MG SUPPOSITORY    Unwrap and insert 1 PR PRN nausea, vomiting or headache QID prn   Plan discharge   Devoria Albe, MD, FACEP   MDM          Ward Givens, MD 02/11/12 Rickey Primus

## 2012-02-12 LAB — GLUCOSE, CAPILLARY: Glucose-Capillary: 203 mg/dL — ABNORMAL HIGH (ref 70–99)

## 2012-02-13 ENCOUNTER — Emergency Department (HOSPITAL_COMMUNITY)
Admission: EM | Admit: 2012-02-13 | Discharge: 2012-02-13 | Disposition: A | Discharge status: Home / Self Care | Payer: Self-pay | Attending: Emergency Medicine | Admitting: Emergency Medicine

## 2012-02-13 ENCOUNTER — Encounter (HOSPITAL_COMMUNITY): Payer: Self-pay | Admitting: Emergency Medicine

## 2012-02-13 DIAGNOSIS — Z794 Long term (current) use of insulin: Secondary | ICD-10-CM | POA: Insufficient documentation

## 2012-02-13 DIAGNOSIS — N390 Urinary tract infection, site not specified: Secondary | ICD-10-CM | POA: Insufficient documentation

## 2012-02-13 DIAGNOSIS — Z79899 Other long term (current) drug therapy: Secondary | ICD-10-CM | POA: Insufficient documentation

## 2012-02-13 DIAGNOSIS — E119 Type 2 diabetes mellitus without complications: Secondary | ICD-10-CM | POA: Insufficient documentation

## 2012-02-13 LAB — POCT I-STAT, CHEM 8
BUN: 12 mg/dL (ref 6–23)
Calcium, Ion: 1.14 mmol/L (ref 1.12–1.23)
Chloride: 105 mEq/L (ref 96–112)
Creatinine, Ser: 0.8 mg/dL (ref 0.50–1.10)
Glucose, Bld: 355 mg/dL — ABNORMAL HIGH (ref 70–99)
HCT: 40 % (ref 36.0–46.0)
Hemoglobin: 13.6 g/dL (ref 12.0–15.0)
Potassium: 4 mEq/L (ref 3.5–5.1)
Sodium: 136 mEq/L (ref 135–145)
TCO2: 20 mmol/L (ref 0–100)

## 2012-02-13 LAB — URINALYSIS, ROUTINE W REFLEX MICROSCOPIC
Glucose, UA: >1000 mg/dL — AB
Hgb urine dipstick: NEGATIVE
Ketones, ur: >80 mg/dL — AB
Nitrite: POSITIVE — AB
Protein, ur: 30 mg/dL — AB
Specific Gravity, Urine: 1.039 — ABNORMAL HIGH (ref 1.005–1.030)
Urobilinogen, UA: 4 mg/dL — ABNORMAL HIGH (ref 0.0–1.0)
pH: 5 (ref 5.0–8.0)

## 2012-02-13 LAB — URINE MICROSCOPIC-ADD ON

## 2012-02-13 LAB — POCT PREGNANCY, URINE: Preg Test, Ur: NEGATIVE

## 2012-02-13 MED ORDER — ONDANSETRON HCL 4 MG/2ML IJ SOLN
4.0000 mg | Freq: Once | INTRAMUSCULAR | Status: AC
  Administered 2012-02-13: 4 mg via INTRAVENOUS
  Filled 2012-02-13: qty 2

## 2012-02-13 MED ORDER — SODIUM CHLORIDE 0.9 % IV BOLUS (SEPSIS)
1000.0000 mL | Freq: Once | INTRAVENOUS | Status: AC
  Administered 2012-02-13: 1000 mL via INTRAVENOUS

## 2012-02-13 MED ORDER — MORPHINE SULFATE 4 MG/ML IJ SOLN
4.0000 mg | Freq: Once | INTRAMUSCULAR | Status: AC
  Administered 2012-02-13: 4 mg via INTRAVENOUS
  Filled 2012-02-13: qty 1

## 2012-02-13 MED ORDER — CIPROFLOXACIN IN D5W 400 MG/200ML IV SOLN
400.0000 mg | Freq: Once | INTRAVENOUS | Status: AC
  Administered 2012-02-13: 400 mg via INTRAVENOUS
  Filled 2012-02-13: qty 200

## 2012-02-13 MED ORDER — CIPROFLOXACIN HCL 500 MG PO TABS: 500.0000 mg | ORAL_TABLET | Freq: Two times a day (BID) | ORAL | Status: DC

## 2012-02-13 NOTE — ED Notes (Signed)
Patient states history of UTI's/kidney stones-states urinary urgency/frequency with discomfort and decrease flow

## 2012-02-13 NOTE — ED Notes (Addendum)
Pt presenting to ed with c/o dysuria pt states she is having low back with positive nausea no vomiting pt states onset this am. Burning with urination

## 2012-02-14 LAB — URINE CULTURE
Colony Count: NO GROWTH
Culture: NO GROWTH

## 2012-02-15 ENCOUNTER — Emergency Department (HOSPITAL_COMMUNITY): Payer: Self-pay

## 2012-02-15 ENCOUNTER — Encounter (HOSPITAL_COMMUNITY): Payer: Self-pay | Admitting: Emergency Medicine

## 2012-02-15 ENCOUNTER — Emergency Department (HOSPITAL_COMMUNITY)
Admission: EM | Admit: 2012-02-15 | Discharge: 2012-02-15 | Disposition: A | Discharge status: Home / Self Care | Payer: Self-pay | Attending: Emergency Medicine | Admitting: Emergency Medicine

## 2012-02-15 DIAGNOSIS — R110 Nausea: Secondary | ICD-10-CM | POA: Insufficient documentation

## 2012-02-15 DIAGNOSIS — Z79899 Other long term (current) drug therapy: Secondary | ICD-10-CM | POA: Insufficient documentation

## 2012-02-15 DIAGNOSIS — Z794 Long term (current) use of insulin: Secondary | ICD-10-CM | POA: Insufficient documentation

## 2012-02-15 DIAGNOSIS — M549 Dorsalgia, unspecified: Secondary | ICD-10-CM

## 2012-02-15 DIAGNOSIS — M546 Pain in thoracic spine: Secondary | ICD-10-CM | POA: Insufficient documentation

## 2012-02-15 DIAGNOSIS — F3289 Other specified depressive episodes: Secondary | ICD-10-CM | POA: Insufficient documentation

## 2012-02-15 DIAGNOSIS — N390 Urinary tract infection, site not specified: Secondary | ICD-10-CM | POA: Insufficient documentation

## 2012-02-15 DIAGNOSIS — F329 Major depressive disorder, single episode, unspecified: Secondary | ICD-10-CM | POA: Insufficient documentation

## 2012-02-15 DIAGNOSIS — R109 Unspecified abdominal pain: Secondary | ICD-10-CM | POA: Insufficient documentation

## 2012-02-15 DIAGNOSIS — E109 Type 1 diabetes mellitus without complications: Secondary | ICD-10-CM | POA: Insufficient documentation

## 2012-02-15 DIAGNOSIS — R739 Hyperglycemia, unspecified: Secondary | ICD-10-CM

## 2012-02-15 LAB — URINALYSIS, ROUTINE W REFLEX MICROSCOPIC
Glucose, UA: >1000 mg/dL — AB
Ketones, ur: >80 mg/dL — AB
Leukocytes, UA: NEGATIVE
Specific Gravity, Urine: 1.044 — ABNORMAL HIGH (ref 1.005–1.030)
pH: 5.5 (ref 5.0–8.0)

## 2012-02-15 LAB — COMPREHENSIVE METABOLIC PANEL
ALT: 9 U/L (ref 0–35)
Albumin: 3.6 g/dL (ref 3.5–5.2)
Alkaline Phosphatase: 85 U/L (ref 39–117)
BUN: 11 mg/dL (ref 6–23)
Calcium: 9 mg/dL (ref 8.4–10.5)
Potassium: 4.1 mEq/L (ref 3.5–5.1)
Sodium: 133 mEq/L — ABNORMAL LOW (ref 135–145)
Total Protein: 7.1 g/dL (ref 6.0–8.3)

## 2012-02-15 LAB — URINE MICROSCOPIC-ADD ON

## 2012-02-15 LAB — CBC WITH DIFFERENTIAL/PLATELET
Basophils Relative: 1 % (ref 0–1)
Eosinophils Absolute: 0.2 10*3/uL (ref 0.0–0.7)
Eosinophils Relative: 3 % (ref 0–5)
MCH: 28.8 pg (ref 26.0–34.0)
MCHC: 33.5 g/dL (ref 30.0–36.0)
Neutrophils Relative %: 62 % (ref 43–77)
Platelets: 359 10*3/uL (ref 150–400)

## 2012-02-15 LAB — GLUCOSE, CAPILLARY
Glucose-Capillary: 327 mg/dL — ABNORMAL HIGH (ref 70–99)
Glucose-Capillary: 346 mg/dL — ABNORMAL HIGH (ref 70–99)

## 2012-02-15 LAB — WET PREP, GENITAL: Clue Cells Wet Prep HPF POC: NONE SEEN

## 2012-02-15 LAB — POCT PREGNANCY, URINE: Preg Test, Ur: NEGATIVE

## 2012-02-15 MED ORDER — SODIUM CHLORIDE 0.9 % IV BOLUS (SEPSIS)
1000.0000 mL | Freq: Once | INTRAVENOUS | Status: AC
  Administered 2012-02-15: 1000 mL via INTRAVENOUS

## 2012-02-15 MED ORDER — INSULIN REGULAR HUMAN 100 UNIT/ML IJ SOLN: 6.0000 [IU] | Freq: Once | INTRAMUSCULAR | Status: DC

## 2012-02-15 MED ORDER — HYDROCODONE-ACETAMINOPHEN 5-325 MG PO TABS: ORAL_TABLET | ORAL | Status: DC

## 2012-02-15 MED ORDER — HYDROMORPHONE HCL PF 1 MG/ML IJ SOLN
1.0000 mg | Freq: Once | INTRAMUSCULAR | Status: AC
  Administered 2012-02-15: 1 mg via INTRAVENOUS
  Filled 2012-02-15: qty 1

## 2012-02-15 MED ORDER — MORPHINE SULFATE 4 MG/ML IJ SOLN
4.0000 mg | Freq: Once | INTRAMUSCULAR | Status: AC
  Administered 2012-02-15: 4 mg via INTRAVENOUS
  Filled 2012-02-15: qty 1

## 2012-02-15 MED ORDER — ONDANSETRON HCL 4 MG/2ML IJ SOLN
4.0000 mg | Freq: Once | INTRAMUSCULAR | Status: AC
  Administered 2012-02-15: 4 mg via INTRAVENOUS
  Filled 2012-02-15: qty 2

## 2012-02-15 MED ORDER — DIPHENHYDRAMINE HCL 50 MG/ML IJ SOLN
12.5000 mg | Freq: Once | INTRAMUSCULAR | Status: AC
  Administered 2012-02-15: 14:00:00 via INTRAVENOUS
  Filled 2012-02-15: qty 1

## 2012-02-15 MED ORDER — INSULIN ASPART 100 UNIT/ML ~~LOC~~ SOLN
8.0000 [IU] | Freq: Once | SUBCUTANEOUS | Status: AC
  Administered 2012-02-15: 8 [IU] via SUBCUTANEOUS
  Filled 2012-02-15: qty 8

## 2012-02-15 MED ORDER — KETOROLAC TROMETHAMINE 30 MG/ML IJ SOLN
30.0000 mg | Freq: Once | INTRAMUSCULAR | Status: AC
  Administered 2012-02-15: 30 mg via INTRAVENOUS
  Filled 2012-02-15: qty 1

## 2012-02-15 MED ORDER — DIAZEPAM 5 MG PO TABS
5.0000 mg | ORAL_TABLET | Freq: Once | ORAL | Status: DC
  Filled 2012-02-15: qty 1

## 2012-02-15 NOTE — ED Provider Notes (Signed)
History     CSN: [id number]  Arrival date & time 02/15/12  0848   First MD Initiated Contact with Patient 02/15/12 [phone]      No chief complaint on file.   (Consider location/radiation/quality/duration/timing/severity/associated sxs/prior treatment) HPI Patient is a 36 year old female who presents today complaining of bilateral midthoracic back pain. She has history of kidney stones and no prior surgical intervention. Patient was discharged 2 days ago to the emergency department after presenting with similar symptoms and evidence of urinary tract infection. She has been treated with Cipro. Urine culture from that time showed no growth. Patient also had hyperglycemia and previous visit. She notes her glucose was 562 last night and in the 300s upon waking. Patient reports using her insulin. She is a type I diabetic but does not currently have a primary care providers she just moved here. She endorses nausea but denies vomiting, abdominal pain, increased or urinary symptoms, vaginal discharge,chest pain, shortness of breath, cough, or fever.  Patient had nausea medication at home which helped her but she did not have any pain medication. Nothing has made her pain better or worse. There are no other associated or modifying factors. Past Medical History  Diagnosis Date  . Ovarian cyst   . Diabetes mellitus   . Depression     Past Surgical History  Procedure Date  . Appendectomy   . Cholecystectomy   . Ovarian cyst removal     No family history on file.  History  Substance Use Topics  . Smoking status: Never Smoker   . Smokeless tobacco: Not on file  . Alcohol Use: No    OB History    Grav Para Term Preterm Abortions TAB SAB Ect Mult Living                  Review of Systems  Constitutional: Negative.   HENT: Negative.   Eyes: Negative.   Respiratory: Negative.   Cardiovascular: Negative.   Gastrointestinal: Positive for nausea.  Genitourinary: Negative.     Musculoskeletal: Positive for back pain.  Skin: Negative.   Neurological: Negative.   Hematological: Negative.   Psychiatric/Behavioral: Negative.   All other systems reviewed and are negative.    Allergies  Sulfa antibiotics; Wellbutrin; Amoxicillin; and Codeine  Home Medications   Current Outpatient Rx  Name Route Sig Dispense Refill  . CIPROFLOXACIN HCL 500 MG PO TABS Oral Take 500 mg by mouth every 12 (twelve) hours.    . IBUPROFEN 200 MG PO TABS Oral Take 400 mg by mouth every 6 (six) hours as needed. For pain    . INSULIN GLARGINE 100 UNIT/ML [place] SOLN Subcutaneous Inject 30 Units into the skin every morning.     . INSULIN LISPRO (HUMAN) 100 UNIT/ML [place] SOLN Subcutaneous Inject 0-8 Units into the skin 3 (three) times daily before meals. Sliding scale.    . ADULT MULTIVITAMIN W/MINERALS CH Oral Take 1 tablet by mouth daily.    Marland Kitchen ONDANSETRON HCL 4 MG PO TABS Oral Take 8 mg by mouth every 8 (eight) hours as needed. For nausea.    Marland Kitchen ZOLPIDEM TARTRATE 5 MG PO TABS Oral Take 5 mg by mouth at bedtime as needed. For insomnia      BP 127/72  Pulse 105  Temp 98.9 F (37.2 C)  SpO2 100%  LMP 01/28/2012  Physical Exam  Nursing note and vitals reviewed. GEN: Well-developed, well-nourished female in uncomfortable appearing but in no distress HEENT: Atraumatic, normocephalic. Oropharynx clear without erythema EYES:  PERRLA BL, no scleral icterus. NECK: Trachea midline, no meningismus CV: regular rate and rhythm. No murmurs, rubs, or gallops PULM: No respiratory distress.  No crackles, wheezes, or rales. GI: soft, non-tender. No guarding, rebound, or tenderness. + bowel sounds  GU: speculum exam with minimal whitish d/c, no CMT or adnexal tenderness on bimanual exam Neuro: cranial nerves grossly 2-12 intact, no abnormalities of strength or sensation, A and O x 3 MSK: Patient moves all 4 extremities symmetrically, no deformity, edema, or injury noted Skin: No rashes petechiae, purpura,  or jaundice Psych: no abnormality of mood   ED Course  Procedures (including critical care time)  Labs Reviewed  COMPREHENSIVE METABOLIC PANEL - Abnormal; Notable for the following:    Sodium 133 (*)     Glucose, Bld 394 (*)     All other components within normal limits  URINALYSIS, ROUTINE W REFLEX MICROSCOPIC - Abnormal; Notable for the following:    Specific Gravity, Urine 1.044 (*)     Glucose, UA >1000 (*)     Ketones, ur >80 (*)     All other components within normal limits  GLUCOSE, CAPILLARY - Abnormal; Notable for the following:    Glucose-Capillary 327 (*)     All other components within normal limits  URINE MICROSCOPIC-ADD ON - Abnormal; Notable for the following:    Squamous Epithelial / LPF MANY (*)     Bacteria, UA FEW (*)     All other components within normal limits  WET PREP, GENITAL - Abnormal; Notable for the following:    WBC, Wet Prep HPF POC MODERATE (*)     All other components within normal limits  GLUCOSE, CAPILLARY - Abnormal; Notable for the following:    Glucose-Capillary 346 (*)     All other components within normal limits  GLUCOSE, CAPILLARY - Abnormal; Notable for the following:    Glucose-Capillary 164 (*)     All other components within normal limits  CBC WITH DIFFERENTIAL  POCT PREGNANCY, URINE  GC/CHLAMYDIA PROBE AMP, GENITAL   Ct Abdomen Pelvis Wo Contrast  02/15/2012  *RADIOLOGY REPORT*  Clinical Data: Bilateral flank pain, nausea and vomiting, history kidney stones, negative pregnancy test  CT ABDOMEN AND PELVIS WITHOUT CONTRAST  Technique:  Multidetector CT imaging of the abdomen and pelvis was performed following the standard protocol without intravenous contrast.  Comparison: CT abdomen pelvis of 10/28/2006  Findings: The small portion of the lung bases included appears clear.  The liver is unremarkable in the unenhanced state.  The gallbladder appears to have been resected.  The pancreas is normal in size and the pancreatic duct is not  dilated.  The adrenal glands and spleen are unremarkable.  The stomach is moderately distended with food debris with no abnormality noted.  Only a single 3 mm nonobstructing left lower pole renal calculus is seen.  No other renal calculi are noted.  There is no evidence of hydronephrosis.  The ureters are normal in caliber.  Abdominal aorta appears normal.  No adenopathy is seen.  The distal ureters are normal in caliber and no distal ureteral calculus is seen.  Probable ovarian follicles are noted right more numerous than left and no significant free fluid is seen within the pelvis.  The urinary bladder is unremarkable.  No abnormality of the colon is seen.  There are surgical clips apparently due to prior appendectomy within the right pelvis.  The terminal ileum is unremarkable.  The uterus is normal in size.  IMPRESSION:  1.  Single nonobstructing left renal calculus of 3 mm. 2.  No ureteral calculi are seen.  No hydronephrosis is noted.  Original Report Authenticated By: Juline Patch, M.D.     1. Back pain   2. UTI (urinary tract infection)   3. Hyperglycemia       MDM  Patient was evaluated by myself. Urine culture was reviewed from previous visit and showed no growth. Patient continued on Cipro for evidence of UTI on prior urinalysis. Here patient was mildly tachycardic but otherwise hemodynamically stable. Laboratory workup to evaluate for possible infection or renal compromise was performed. Urine pregnancy was negative. Patient has history of kidney stones but no recent scans for same. CT of abdomen and pelvis noncontrast performed.  This showed only a single intrarenal stone.  Patient had pelvic based on yeast in urinalysis.  Patient had ketones in her urine but no anion gap.  She was treated for her hyperglycemia here with improvement to 164.  Patient did have improvement in all of her lads compared to previous visit.  As she is still on Cipro I do feel that her antibiotic is continuing to  work.  The patient's hyperglycemia is explained by her failure to take her insulin this morning.  Urin culture was sent again.  Patient had gone home with no pain meds at previous visit and Rx was given today.  She was discharged in good condition.        Cyndra Numbers, MD 02/16/12 1250

## 2012-02-15 NOTE — ED Notes (Signed)
Pt set up for pelvic exam.

## 2012-02-15 NOTE — ED Notes (Signed)
RN to obtain labs with start of IV 

## 2012-02-15 NOTE — ED Notes (Signed)
Pt here via pov for c/o lower back pain seen on 8/14 for uti stated  Her back pain has increased and that why she had to be seen today

## 2012-02-16 LAB — GC/CHLAMYDIA PROBE AMP, GENITAL
Chlamydia, DNA Probe: NEGATIVE
GC Probe Amp, Genital: NEGATIVE

## 2012-02-20 NOTE — ED Provider Notes (Signed)
History    36yF with back pain. Mid back. Gradual onset about 2d ago. Denies trauma. Pain more or less constant. No fever or chills. Nausea but no vomiting. Dysuria. No hematuria. No unusual vaginal bleeding or discharge. NO numbness, tingling or loss of strength. Denies hx of back surgery. Denies IV drug use. Denies use of blood thinners.  CSN: [id number]  Arrival date & time 02/13/12  1043   First MD Initiated Contact with Patient 02/13/12 1058      Chief Complaint  Patient presents with  . Dysuria    (Consider location/radiation/quality/duration/timing/severity/associated sxs/prior treatment) HPI  Past Medical History  Diagnosis Date  . Ovarian cyst   . Diabetes mellitus   . Depression     Past Surgical History  Procedure Date  . Appendectomy   . Cholecystectomy   . Ovarian cyst removal     No family history on file.  History  Substance Use Topics  . Smoking status: Never Smoker   . Smokeless tobacco: Not on file  . Alcohol Use: No    OB History    Grav Para Term Preterm Abortions TAB SAB Ect Mult Living                  Review of Systems   Review of symptoms negative unless otherwise noted in HPI.   Allergies  Sulfa antibiotics; Wellbutrin; Amoxicillin; and Codeine  Home Medications   Current Outpatient Rx  Name Route Sig Dispense Refill  . INSULIN GLARGINE 100 UNIT/ML [place] SOLN Subcutaneous Inject 30 Units into the skin every morning.     . INSULIN LISPRO (HUMAN) 100 UNIT/ML [place] SOLN Subcutaneous Inject 0-8 Units into the skin 3 (three) times daily before meals. Sliding scale.    Marland Kitchen ONDANSETRON HCL 4 MG PO TABS Oral Take 8 mg by mouth every 8 (eight) hours as needed. For nausea.    Marland Kitchen ZOLPIDEM TARTRATE 5 MG PO TABS Oral Take 5 mg by mouth at bedtime as needed. For insomnia    . CIPROFLOXACIN HCL 500 MG PO TABS Oral Take 500 mg by mouth every 12 (twelve) hours.    Marland Kitchen HYDROCODONE-ACETAMINOPHEN 5-325 MG PO TABS  Take 1-2 tablets by mouth every 6 hours when  necessary pain. 15 tablet 0  . IBUPROFEN 200 MG PO TABS Oral Take 400 mg by mouth every 6 (six) hours as needed. For pain    . ADULT MULTIVITAMIN W/MINERALS CH Oral Take 1 tablet by mouth daily.      BP 121/68  Pulse 101  Temp 98.4 F (36.9 C) (Oral)  Resp 20  SpO2 97%  LMP 01/28/2012  Physical Exam  Nursing note and vitals reviewed. Constitutional: She is oriented to person, place, and time. She appears well-developed and well-nourished. No distress.  HENT:  Head: Normocephalic and atraumatic.  Eyes: Conjunctivae are normal. Pupils are equal, round, and reactive to light. Right eye exhibits no discharge. Left eye exhibits no discharge.  Neck: Normal range of motion. Neck supple.  Cardiovascular: Normal rate, regular rhythm and normal heart sounds.  Exam reveals no gallop and no friction rub.   No murmur heard. Pulmonary/Chest: Effort normal and breath sounds normal. No respiratory distress.  Abdominal: Soft. She exhibits no distension. There is no tenderness.  Musculoskeletal: She exhibits no edema and no tenderness.       Pt points to lower thoracic region as location of pain but not reproducible on exam. NO overlying skin changes.  Neurological: She is alert and oriented to  person, place, and time. No cranial nerve deficit. She exhibits normal muscle tone. Coordination normal.       Gait steady. Sensation intact to light touch.  Skin: Skin is warm and dry.  Psychiatric: She has a normal mood and affect. Her behavior is normal. Thought content normal.    ED Course  Procedures (including critical care time)  Labs Reviewed  URINALYSIS, ROUTINE W REFLEX MICROSCOPIC - Abnormal; Notable for the following:    Color, Urine RED (*)  BIOCHEMICALS MAY BE AFFECTED BY COLOR   APPearance CLOUDY (*)     Specific Gravity, Urine 1.039 (*)     Glucose, UA >1000 (*)     Bilirubin Urine MODERATE (*)     Ketones, ur >80 (*)     Protein, ur 30 (*)     Urobilinogen, UA 4.0 (*)     Nitrite  POSITIVE (*)     Leukocytes, UA MODERATE (*)     All other components within normal limits  POCT I-STAT, CHEM 8 - Abnormal; Notable for the following:    Glucose, Bld 355 (*)     All other components within normal limits  URINE MICROSCOPIC-ADD ON - Abnormal; Notable for the following:    Bacteria, UA MANY (*)     All other components within normal limits  POCT PREGNANCY, URINE  URINE CULTURE  LAB REPORT - SCANNED   No results found.   1. UTI (lower urinary tract infection)       MDM  36yF with back pain and dysuria. UA consistent with UTI. No blood to suggest stone and pain more constant and not so much colicy. Hyperglycemic and ketones noted on UA but not acidotic/no gap. Pt is afebrile and no vomiting in ED. Dose IV abx given. Feel appropriate for DC with continued abx as outpt. Return precautions discussed. Outtp fu otherwise.         Raeford Razor, MD 02/20/12 [phone]

## 2012-05-10 ENCOUNTER — Emergency Department (HOSPITAL_BASED_OUTPATIENT_CLINIC_OR_DEPARTMENT_OTHER)
Admission: EM | Admit: 2012-05-10 | Discharge: 2012-05-10 | Disposition: A | Discharge status: Home / Self Care | Payer: Self-pay | Attending: Emergency Medicine | Admitting: Emergency Medicine

## 2012-05-10 ENCOUNTER — Encounter (HOSPITAL_BASED_OUTPATIENT_CLINIC_OR_DEPARTMENT_OTHER): Payer: Self-pay | Admitting: Emergency Medicine

## 2012-05-10 DIAGNOSIS — Z8742 Personal history of other diseases of the female genital tract: Secondary | ICD-10-CM | POA: Insufficient documentation

## 2012-05-10 DIAGNOSIS — R110 Nausea: Secondary | ICD-10-CM | POA: Insufficient documentation

## 2012-05-10 DIAGNOSIS — Z8659 Personal history of other mental and behavioral disorders: Secondary | ICD-10-CM | POA: Insufficient documentation

## 2012-05-10 DIAGNOSIS — R51 Headache: Secondary | ICD-10-CM

## 2012-05-10 DIAGNOSIS — R6883 Chills (without fever): Secondary | ICD-10-CM | POA: Insufficient documentation

## 2012-05-10 DIAGNOSIS — E119 Type 2 diabetes mellitus without complications: Secondary | ICD-10-CM | POA: Insufficient documentation

## 2012-05-10 DIAGNOSIS — Z794 Long term (current) use of insulin: Secondary | ICD-10-CM | POA: Insufficient documentation

## 2012-05-10 MED ORDER — METOCLOPRAMIDE HCL 5 MG/ML IJ SOLN
10.0000 mg | Freq: Once | INTRAMUSCULAR | Status: AC
  Administered 2012-05-10: 10 mg via INTRAMUSCULAR
  Filled 2012-05-10: qty 2

## 2012-05-10 MED ORDER — BUTALBITAL-APAP-CAFFEINE 50-325-40 MG PO TABS: 1.0000 | ORAL_TABLET | Freq: Four times a day (QID) | ORAL | Status: DC | PRN

## 2012-05-10 MED ORDER — KETOROLAC TROMETHAMINE 60 MG/2ML IM SOLN
60.0000 mg | Freq: Once | INTRAMUSCULAR | Status: AC
  Administered 2012-05-10: 60 mg via INTRAMUSCULAR
  Filled 2012-05-10: qty 2

## 2012-05-10 MED ORDER — SUMATRIPTAN SUCCINATE 6 MG/0.5ML ~~LOC~~ SOLN
6.0000 mg | Freq: Once | SUBCUTANEOUS | Status: AC
  Administered 2012-05-10: 6 mg via SUBCUTANEOUS
  Filled 2012-05-10: qty 0.5

## 2012-05-10 MED ORDER — LIDOCAINE HCL (PF) 1 % IJ SOLN
5.0000 mL | Freq: Once | INTRAMUSCULAR | Status: AC
  Administered 2012-05-10: 5 mL
  Filled 2012-05-10: qty 5

## 2012-05-10 MED ORDER — DIPHENHYDRAMINE HCL 50 MG/ML IJ SOLN
25.0000 mg | Freq: Once | INTRAMUSCULAR | Status: DC
  Filled 2012-05-10: qty 1

## 2012-05-10 NOTE — ED Notes (Signed)
Pt refused any meds that may sedate-states her ride can now not come drive her-EDPA notified-benadryl not given and order d/c'ed

## 2012-05-10 NOTE — ED Notes (Signed)
Pt c/o headache x 3 weeks.  Pt states pain is worsening.

## 2012-05-10 NOTE — ED Provider Notes (Signed)
History     CSN: [id number]  Arrival date & time 05/10/12  1329   First MD Initiated Contact with Patient 05/10/12 1352      Chief Complaint  Patient presents with  . Headache    (Consider location/radiation/quality/duration/timing/severity/associated sxs/prior treatment) The history is provided by the patient and medical records.    [name] is a 36 y.o. female presents to the emergency department c/o headache. The headache began 1 month of clusters.  She has been taking worse at but she is out of that.  Hx of cluster headaches.  She states the pain began gradually, have been intermittent and gradually worsened.  She describes them as sharp pains, located on the R side.  She denies changes in vision.  She has associated nausea, photophobia, phonophobia.  She denies trauma.  She denies neck pain, chest pain, shortness of breath, abdominal pain, vomiting, diarrhea, weakness, numbness or tingling.    Past Medical History  Diagnosis Date  . Ovarian cyst   . Diabetes mellitus   . Depression     Past Surgical History  Procedure Date  . Appendectomy   . Cholecystectomy   . Ovarian cyst removal     No family history on file.  History  Substance Use Topics  . Smoking status: Never Smoker   . Smokeless tobacco: Not on file  . Alcohol Use: No    OB History    Grav Para Term Preterm Abortions TAB SAB Ect Mult Living                  Review of Systems  Constitutional: Positive for chills. Negative for fever, diaphoresis, appetite change, fatigue and unexpected weight change.  HENT: Negative for nosebleeds, congestion, sneezing, mouth sores, neck pain, neck stiffness, postnasal drip and sinus pressure.   Eyes: Negative for visual disturbance.  Respiratory: Negative for cough, chest tightness, shortness of breath and wheezing.   Cardiovascular: Negative for chest pain.  Gastrointestinal: Positive for nausea. Negative for vomiting, abdominal pain, diarrhea and  constipation.  Genitourinary: Negative for dysuria, urgency, frequency and hematuria.  Musculoskeletal: Negative for back pain.  Skin: Negative for rash.  Neurological: Positive for headaches. Negative for syncope and light-headedness.  Psychiatric/Behavioral: Negative for sleep disturbance. The patient is not nervous/anxious.   All other systems reviewed and are negative.    Allergies  Sulfa antibiotics; Wellbutrin; Amoxicillin; and Codeine  Home Medications   Current Outpatient Rx  Name  Route  Sig  Dispense  Refill  . BUTALBITAL-APAP-CAFFEINE 50-325-40 MG PO TABS   Oral   Take 1-2 tablets by mouth every 6 (six) hours as needed for headache.   20 tablet   0   . HYDROCODONE-ACETAMINOPHEN 5-325 MG PO TABS      Take 1-2 tablets by mouth every 6 hours when necessary pain.   15 tablet   0   . IBUPROFEN 200 MG PO TABS   Oral   Take 400 mg by mouth every 6 (six) hours as needed. For pain         . INSULIN GLARGINE 100 UNIT/ML [place] SOLN   Subcutaneous   Inject 30 Units into the skin every morning.          . INSULIN LISPRO (HUMAN) 100 UNIT/ML [place] SOLN   Subcutaneous   Inject 0-8 Units into the skin 3 (three) times daily before meals. Sliding scale.         . ADULT MULTIVITAMIN W/MINERALS CH   Oral  Take 1 tablet by mouth daily.           BP 112/71  Pulse 86  Temp 98.9 F (37.2 C) (Oral)  Resp 16  SpO2 100%  LMP 05/05/2012  Physical Exam  Nursing note and vitals reviewed. Constitutional: She is oriented to person, place, and time. She appears well-developed and well-nourished. No distress.  HENT:  Head: Normocephalic and atraumatic.  Right Ear: Tympanic membrane, external ear and ear canal normal.  Left Ear: Tympanic membrane, external ear and ear canal normal.  Nose: Nose normal. Right sinus exhibits no maxillary sinus tenderness and no frontal sinus tenderness. Left sinus exhibits no maxillary sinus tenderness and no frontal sinus tenderness.    Mouth/Throat: Uvula is midline, oropharynx is clear and moist and mucous membranes are normal. No oropharyngeal exudate.  Eyes: Conjunctivae normal and EOM are normal. Pupils are equal, round, and reactive to light. No scleral icterus.  Neck: Normal range of motion. Neck supple.  Cardiovascular: Normal rate, regular rhythm, normal heart sounds and intact distal pulses.  Exam reveals no gallop and no friction rub.   No murmur heard. Pulmonary/Chest: Effort normal and breath sounds normal. No respiratory distress. She has no wheezes.  Abdominal: Soft. Bowel sounds are normal. She exhibits no mass. There is no tenderness. There is no rebound and no guarding.  Musculoskeletal: Normal range of motion. She exhibits no edema.  Neurological: She is alert and oriented to person, place, and time.       Speech is clear and goal oriented, follows commands Major Cranial nerves without deficit,  Normal strength in upper and lower extremities bilaterally including dorsiflexion and plantar flexion, strong and equal grip strength Sensation normal to light and sharp touch Moves extremities without ataxia, coordination intact Normal finger to nose and rapid alternating movements Normal gait and balance  Skin: Skin is warm and dry. She is not diaphoretic.  Psychiatric: She has a normal mood and affect.    ED Course  Procedures (including critical care time)  Labs Reviewed - No data to display No results found.   1. Headache       MDM  [name] presentswith headache.  His a history of cluster headache.  Treated here in the department with oxygen, sumatriptan, intranasal lidocaine.  Patient with some decrease in her pain level.  Patient states it works best is Geophysicist/field seismologist which we do not have here at [facility].  Pt HA treated and improved while in ED.  Presentation is like pts typical HA and non concerning for [facility], ICH, Meningitis, or temporal arteritis. Pt is afebrile with no focal neuro deficits,  nuchal rigidity, or change in vision. Pt is to follow up with PCP to discuss prophylactic medication.  I have discharged home with prescription for Fioricet and recommended that the patient take 1 tonight. Pt verbalizes understanding and is agreeable with plan to dc.   1. Medications: fioricet  2. Treatment: rest, take medication as prescribed  3. Follow Up: with PCP          Dierdre Forth, PA-C 05/10/12 1800

## 2012-05-11 NOTE — ED Provider Notes (Signed)
Medical screening examination/treatment/procedure(s) were performed by non-physician practitioner and as supervising physician I was immediately available for consultation/collaboration.  Kember Boch, MD 05/11/12 1003 

## 2012-05-17 ENCOUNTER — Encounter (HOSPITAL_COMMUNITY): Payer: Self-pay | Admitting: *Deleted

## 2012-05-17 ENCOUNTER — Emergency Department (HOSPITAL_COMMUNITY)
Admission: EM | Admit: 2012-05-17 | Discharge: 2012-05-18 | Disposition: A | Discharge status: Home / Self Care | Payer: Self-pay | Attending: Emergency Medicine | Admitting: Emergency Medicine

## 2012-05-17 DIAGNOSIS — R110 Nausea: Secondary | ICD-10-CM | POA: Insufficient documentation

## 2012-05-17 DIAGNOSIS — G43909 Migraine, unspecified, not intractable, without status migrainosus: Secondary | ICD-10-CM | POA: Insufficient documentation

## 2012-05-17 DIAGNOSIS — E119 Type 2 diabetes mellitus without complications: Secondary | ICD-10-CM | POA: Insufficient documentation

## 2012-05-17 DIAGNOSIS — R6883 Chills (without fever): Secondary | ICD-10-CM | POA: Insufficient documentation

## 2012-05-17 DIAGNOSIS — Z794 Long term (current) use of insulin: Secondary | ICD-10-CM | POA: Insufficient documentation

## 2012-05-17 DIAGNOSIS — R51 Headache: Secondary | ICD-10-CM | POA: Insufficient documentation

## 2012-05-17 DIAGNOSIS — Z8742 Personal history of other diseases of the female genital tract: Secondary | ICD-10-CM | POA: Insufficient documentation

## 2012-05-17 LAB — BLOOD GAS, VENOUS
Acid-base deficit: 3.9 mmol/L — ABNORMAL HIGH (ref 0.0–2.0)
Drawn by: 311831
FIO2: 0.21 %
O2 Saturation: 52 %
Patient temperature: 98.6
TCO2: 19.5 mmol/L (ref 0–100)
pCO2, Ven: 42.1 mmHg — ABNORMAL LOW (ref 45.0–50.0)

## 2012-05-17 LAB — COMPREHENSIVE METABOLIC PANEL
ALT: 10 U/L (ref 0–35)
AST: 14 U/L (ref 0–37)
Albumin: 3.8 g/dL (ref 3.5–5.2)
Alkaline Phosphatase: 74 U/L (ref 39–117)
Calcium: 9.1 mg/dL (ref 8.4–10.5)
GFR calc Af Amer: >90 mL/min (ref >90)
Potassium: 3.8 mEq/L (ref 3.5–5.1)
Sodium: 138 mEq/L (ref 135–145)
Total Protein: 7.4 g/dL (ref 6.0–8.3)

## 2012-05-17 LAB — URINALYSIS, ROUTINE W REFLEX MICROSCOPIC
Glucose, UA: >1000 mg/dL — AB
Ketones, ur: NEGATIVE mg/dL
Leukocytes, UA: NEGATIVE
Nitrite: NEGATIVE
Protein, ur: NEGATIVE mg/dL
Urobilinogen, UA: 0.2 mg/dL (ref 0.0–1.0)

## 2012-05-17 LAB — CBC WITH DIFFERENTIAL/PLATELET
Basophils Absolute: 0 10*3/uL (ref 0.0–0.1)
Basophils Relative: 0 % (ref 0–1)
Eosinophils Absolute: 0.1 10*3/uL (ref 0.0–0.7)
Eosinophils Relative: 2 % (ref 0–5)
Lymphs Abs: 3.6 10*3/uL (ref 0.7–4.0)
MCH: 29.1 pg (ref 26.0–34.0)
MCHC: 34.2 g/dL (ref 30.0–36.0)
MCV: 85.2 fL (ref 78.0–100.0)
Neutrophils Relative %: 43 % (ref 43–77)
Platelets: 319 10*3/uL (ref 150–400)
RBC: 5.08 MIL/uL (ref 3.87–5.11)
RDW: 13.3 % (ref 11.5–15.5)

## 2012-05-17 LAB — GLUCOSE, CAPILLARY

## 2012-05-17 LAB — URINE MICROSCOPIC-ADD ON

## 2012-05-17 MED ORDER — HYDROMORPHONE HCL PF 1 MG/ML IJ SOLN
1.0000 mg | Freq: Once | INTRAMUSCULAR | Status: AC
  Administered 2012-05-17: 1 mg via INTRAVENOUS
  Filled 2012-05-17: qty 1

## 2012-05-17 MED ORDER — BUTALBITAL-APAP-CAFFEINE 50-325-40 MG PO TABS: 1.0000 | ORAL_TABLET | Freq: Four times a day (QID) | ORAL | Status: DC | PRN

## 2012-05-17 MED ORDER — DIPHENHYDRAMINE HCL 50 MG/ML IJ SOLN
25.0000 mg | Freq: Once | INTRAMUSCULAR | Status: AC
  Administered 2012-05-17: 25 mg via INTRAVENOUS
  Filled 2012-05-17: qty 1

## 2012-05-17 MED ORDER — KETOROLAC TROMETHAMINE 30 MG/ML IJ SOLN
30.0000 mg | Freq: Once | INTRAMUSCULAR | Status: AC
  Administered 2012-05-17: 30 mg via INTRAVENOUS
  Filled 2012-05-17: qty 1

## 2012-05-17 MED ORDER — SODIUM CHLORIDE 0.9 % IV BOLUS (SEPSIS)
1000.0000 mL | Freq: Once | INTRAVENOUS | Status: AC
  Administered 2012-05-17: 1000 mL via INTRAVENOUS

## 2012-05-17 MED ORDER — ONDANSETRON 4 MG PO TBDP: 4.0000 mg | ORAL_TABLET | Freq: Three times a day (TID) | ORAL | Status: DC | PRN

## 2012-05-17 MED ORDER — DEXTROSE 5 % IV SOLN
20.0000 mg | Freq: Once | INTRAVENOUS | Status: AC
  Administered 2012-05-17: 20 mg via INTRAVENOUS
  Filled 2012-05-17: qty 4

## 2012-05-17 MED ORDER — DEXAMETHASONE SODIUM PHOSPHATE 10 MG/ML IJ SOLN
10.0000 mg | Freq: Once | INTRAMUSCULAR | Status: AC
  Administered 2012-05-17: 10 mg via INTRAVENOUS
  Filled 2012-05-17: qty 1

## 2012-05-17 NOTE — ED Provider Notes (Signed)
History     CSN: [id number]  Arrival date & time 05/17/12  1642   First MD Initiated Contact with Patient 05/17/12 2028      Chief Complaint  Patient presents with  . Migraine    HPI  This insulin-dependent diabetic female with history of migraines now presents with ongoing headache.  This headache began yesterday, since onset has become severe.  Headache is diffuse, throbbing, with associated photophobia and there is no associated confusion, disorientation, ataxia, discoordination, visual changes beyond photophobia.  He does complain of nausea with vomiting.  This is characteristically "the same" as innumerable prior headaches, including one several weeks ago that required evaluation at our affiliated facility.  Since attention and sutured in generally well, tolerating Fioricet for pain control.  This headache is not improved with Fioricet.  There are no clear other exacerbating factors.   The patient has been evaluated several times for migraines, including by neurology, with CT scans.   Past Medical History  Diagnosis Date  . Ovarian cyst   . Diabetes mellitus   . Depression     Past Surgical History  Procedure Date  . Appendectomy   . Cholecystectomy   . Ovarian cyst removal     No family history on file.  History  Substance Use Topics  . Smoking status: Never Smoker   . Smokeless tobacco: Not on file  . Alcohol Use: No    OB History    Grav Para Term Preterm Abortions TAB SAB Ect Mult Living                  Review of Systems  Constitutional: Positive for chills. Negative for fever, diaphoresis, appetite change, fatigue and unexpected weight change.  HENT: Negative for nosebleeds, congestion, sneezing, mouth sores, neck pain, neck stiffness, postnasal drip and sinus pressure.   Eyes: Negative for visual disturbance.  Respiratory: Negative for cough, chest tightness, shortness of breath and wheezing.   Cardiovascular: Negative for chest pain.    Gastrointestinal: Positive for nausea. Negative for vomiting, abdominal pain, diarrhea and constipation.  Genitourinary: Negative for dysuria, urgency, frequency and hematuria.  Musculoskeletal: Negative for back pain.  Skin: Negative for rash.  Neurological: Positive for headaches. Negative for syncope and light-headedness.  Psychiatric/Behavioral: Negative for sleep disturbance. The patient is not nervous/anxious.   All other systems reviewed and are negative.    Allergies  Sulfa antibiotics; Wellbutrin; Amoxicillin; and Codeine  Home Medications   Current Outpatient Rx  Name  Route  Sig  Dispense  Refill  . BUTALBITAL-APAP-CAFFEINE 50-325-40 MG PO TABS   Oral   Take 1-2 tablets by mouth every 6 (six) hours as needed for headache.   20 tablet   0   . IBUPROFEN 200 MG PO TABS   Oral   Take 400 mg by mouth every 6 (six) hours as needed. For pain         . INSULIN ASPART 100 UNIT/ML [place] SOLN   Subcutaneous   Inject 10-20 Units into the skin 3 (three) times daily. PT USES SLIDING SCALE.         Marland Kitchen INSULIN GLARGINE 100 UNIT/ML [place] SOLN   Subcutaneous   Inject 30 Units into the skin every morning.          . ADULT MULTIVITAMIN W/MINERALS CH   Oral   Take 1 tablet by mouth daily.         Marland Kitchen PROMETHAZINE HCL 25 MG PO TABS   Oral   Take  25 mg by mouth every 6 (six) hours as needed. NAUSEA         . HYDROCODONE-ACETAMINOPHEN 5-325 MG PO TABS      Take 1-2 tablets by mouth every 6 hours when necessary pain.   15 tablet   0     BP 111/65  Pulse 101  Temp 98.5 F (36.9 C) (Oral)  Resp 18  SpO2 100%  LMP 05/05/2012  Physical Exam  Nursing note and vitals reviewed. Constitutional: She is oriented to person, place, and time. She appears well-developed and well-nourished. No distress.  HENT:  Head: Normocephalic and atraumatic.  Right Ear: Tympanic membrane, external ear and ear canal normal.  Left Ear: Tympanic membrane, external ear and ear canal normal.   Nose: Nose normal. Right sinus exhibits no maxillary sinus tenderness and no frontal sinus tenderness. Left sinus exhibits no maxillary sinus tenderness and no frontal sinus tenderness.  Mouth/Throat: Uvula is midline, oropharynx is clear and moist and mucous membranes are normal. No oropharyngeal exudate.  Eyes: Conjunctivae normal and EOM are normal. Pupils are equal, round, and reactive to light. No scleral icterus.  Neck: Normal range of motion. Neck supple.  Cardiovascular: Normal rate, regular rhythm, normal heart sounds and intact distal pulses.  Exam reveals no gallop and no friction rub.   No murmur heard. Pulmonary/Chest: Effort normal and breath sounds normal. No respiratory distress. She has no wheezes.  Abdominal: Soft. Bowel sounds are normal. She exhibits no mass. There is no tenderness. There is no rebound and no guarding.  Musculoskeletal: Normal range of motion. She exhibits no edema.  Neurological: She is alert and oriented to person, place, and time.       Speech is clear and goal oriented, follows commands Major Cranial nerves without deficit,  Normal strength in upper and lower extremities bilaterally including dorsiflexion and plantar flexion, strong and equal grip strength Sensation normal to light and sharp touch Moves extremities without ataxia, coordination intact Normal finger to nose and rapid alternating movements Normal gait and balance  Skin: Skin is warm and dry. She is not diaphoretic.  Psychiatric: She has a normal mood and affect.    ED Course  Procedures (including critical care time)  Labs Reviewed  CBC WITH DIFFERENTIAL - Abnormal; Notable for the following:    Lymphocytes Relative 49 (*)     All other components within normal limits  COMPREHENSIVE METABOLIC PANEL - Abnormal; Notable for the following:    Glucose, Bld 160 (*)     Total Bilirubin 0.2 (*)     All other components within normal limits  URINALYSIS, ROUTINE W REFLEX MICROSCOPIC -  Abnormal; Notable for the following:    Glucose, UA >1000 (*)     All other components within normal limits  BLOOD GAS, VENOUS - Abnormal; Notable for the following:    pH, Ven 7.327 (*)     pCO2, Ven 42.1 (*)     Acid-base deficit 3.9 (*)     All other components within normal limits  URINE MICROSCOPIC-ADD ON - Abnormal; Notable for the following:    Squamous Epithelial / LPF FEW (*)     Bacteria, UA FEW (*)     All other components within normal limits  GLUCOSE, CAPILLARY - Abnormal; Notable for the following:    Glucose-Capillary 156 (*)     All other components within normal limits  POCT PREGNANCY, URINE  URINE CULTURE   No results found.   No diagnosis found.  Update: HA  improving  Update: HA improving    MDM  This female with history of migraine headaches presents with characteristically similar headaches.  On exam she has no neurologic deficits, is afebrile.  However, she is uncomfortable.  After several rounds of medication, she improved.  Absent fever, distress, leukocytosis, rash, there is low suspicion for common current infection or hemorrhage.  The patient will be discharged in stable condition to follow up with her primary care physician for additional consideration of new medication.     Gerhard Munch, MD 05/17/12 [phone]

## 2012-05-17 NOTE — ED Notes (Signed)
Oxygen therapy at 100% initiated as requested. Pt states that has not worked in the past nor has the medications.

## 2012-05-17 NOTE — ED Notes (Signed)
Has history of Cluster Migraines, this one started 3 days ago, nausea and vomiting with headache

## 2012-05-19 LAB — URINE CULTURE: Colony Count: 25000

## 2012-05-23 ENCOUNTER — Emergency Department (HOSPITAL_BASED_OUTPATIENT_CLINIC_OR_DEPARTMENT_OTHER)
Admission: EM | Admit: 2012-05-23 | Discharge: 2012-05-23 | Disposition: A | Discharge status: Home / Self Care | Payer: Self-pay | Attending: Emergency Medicine | Admitting: Emergency Medicine

## 2012-05-23 ENCOUNTER — Encounter (HOSPITAL_BASED_OUTPATIENT_CLINIC_OR_DEPARTMENT_OTHER): Payer: Self-pay | Admitting: *Deleted

## 2012-05-23 DIAGNOSIS — Z8742 Personal history of other diseases of the female genital tract: Secondary | ICD-10-CM | POA: Insufficient documentation

## 2012-05-23 DIAGNOSIS — Z8659 Personal history of other mental and behavioral disorders: Secondary | ICD-10-CM | POA: Insufficient documentation

## 2012-05-23 DIAGNOSIS — G43909 Migraine, unspecified, not intractable, without status migrainosus: Secondary | ICD-10-CM | POA: Insufficient documentation

## 2012-05-23 DIAGNOSIS — E119 Type 2 diabetes mellitus without complications: Secondary | ICD-10-CM | POA: Insufficient documentation

## 2012-05-23 DIAGNOSIS — R110 Nausea: Secondary | ICD-10-CM | POA: Insufficient documentation

## 2012-05-23 DIAGNOSIS — Z79899 Other long term (current) drug therapy: Secondary | ICD-10-CM | POA: Insufficient documentation

## 2012-05-23 DIAGNOSIS — Z794 Long term (current) use of insulin: Secondary | ICD-10-CM | POA: Insufficient documentation

## 2012-05-23 HISTORY — DX: Cluster headache syndrome, unspecified, intractable: G44.001

## 2012-05-23 MED ORDER — HYDROMORPHONE HCL PF 1 MG/ML IJ SOLN
1.0000 mg | Freq: Once | INTRAMUSCULAR | Status: AC
  Administered 2012-05-23: 1 mg via INTRAVENOUS

## 2012-05-23 MED ORDER — PROMETHAZINE HCL 25 MG/ML IJ SOLN
25.0000 mg | Freq: Once | INTRAMUSCULAR | Status: AC
  Administered 2012-05-23: 25 mg via INTRAVENOUS
  Filled 2012-05-23: qty 1

## 2012-05-23 MED ORDER — DIPHENHYDRAMINE HCL 25 MG PO CAPS
50.0000 mg | ORAL_CAPSULE | Freq: Once | ORAL | Status: AC
  Administered 2012-05-23: 50 mg via ORAL
  Filled 2012-05-23: qty 2

## 2012-05-23 MED ORDER — HYDROMORPHONE HCL PF 1 MG/ML IJ SOLN: 1.0000 mg | Freq: Once | INTRAMUSCULAR | Status: DC

## 2012-05-23 MED ORDER — BUTALBITAL-APAP-CAFFEINE 50-325-40 MG PO TABS: 1.0000 | ORAL_TABLET | Freq: Four times a day (QID) | ORAL | Status: DC | PRN

## 2012-05-23 MED ORDER — HYDROMORPHONE HCL PF 2 MG/ML IJ SOLN
2.0000 mg | Freq: Once | INTRAMUSCULAR | Status: AC
  Administered 2012-05-23: 2 mg via INTRAMUSCULAR
  Filled 2012-05-23: qty 1

## 2012-05-23 MED ORDER — HYDROMORPHONE HCL PF 1 MG/ML IJ SOLN
INTRAMUSCULAR | Status: AC
  Administered 2012-05-23: 1 mg via INTRAVENOUS
  Filled 2012-05-23: qty 1

## 2012-05-23 NOTE — ED Provider Notes (Signed)
History     CSN: [id number]  Arrival date & time 05/23/12  1818   First MD Initiated Contact with Patient 05/23/12 1906      Chief Complaint  Patient presents with  . Migraine    (Consider location/radiation/quality/duration/timing/severity/associated sxs/prior treatment) Patient is a 36 y.o. female presenting with migraines. The history is provided by the patient. No language interpreter was used.  Migraine This is a new problem. The current episode started today. The problem occurs constantly. The problem has been gradually worsening. Associated symptoms include nausea. Pertinent negatives include no neck pain, numbness, rash, sore throat, vertigo, visual change, vomiting or weakness. Treatments tried: fiorcet. The treatment provided no relief.  Pt   Past Medical History  Diagnosis Date  . Ovarian cyst   . Diabetes mellitus   . Depression   . Cluster headache syndrome, intractable     Past Surgical History  Procedure Date  . Appendectomy   . Cholecystectomy   . Ovarian cyst removal     No family history on file.  History  Substance Use Topics  . Smoking status: Never Smoker   . Smokeless tobacco: Not on file  . Alcohol Use: No    OB History    Grav Para Term Preterm Abortions TAB SAB Ect Mult Living                  Review of Systems  HENT: Negative for sore throat and neck pain.   Gastrointestinal: Positive for nausea. Negative for vomiting.  Skin: Negative for rash.  Neurological: Negative for vertigo, weakness and numbness.  All other systems reviewed and are negative.    Allergies  Sulfa antibiotics; Wellbutrin; Amoxicillin; and Codeine  Home Medications   Current Outpatient Rx  Name  Route  Sig  Dispense  Refill  . BUTALBITAL-APAP-CAFFEINE 50-325-40 MG PO TABS   Oral   Take 1-2 tablets by mouth every 6 (six) hours as needed for headache.   20 tablet   0   . HYDROCODONE-ACETAMINOPHEN 5-325 MG PO TABS      Take 1-2 tablets by mouth every  6 hours when necessary pain.   15 tablet   0   . IBUPROFEN 200 MG PO TABS   Oral   Take 400 mg by mouth every 6 (six) hours as needed. For pain         . INSULIN ASPART 100 UNIT/ML [place] SOLN   Subcutaneous   Inject 10-20 Units into the skin 3 (three) times daily. PT USES SLIDING SCALE.         Marland Kitchen INSULIN GLARGINE 100 UNIT/ML [place] SOLN   Subcutaneous   Inject 30 Units into the skin every morning.          . ADULT MULTIVITAMIN W/MINERALS CH   Oral   Take 1 tablet by mouth daily.         Marland Kitchen ONDANSETRON 4 MG PO TBDP   Oral   Take 1 tablet (4 mg total) by mouth every 8 (eight) hours as needed for nausea.   20 tablet   0   . PROMETHAZINE HCL 25 MG PO TABS   Oral   Take 25 mg by mouth every 6 (six) hours as needed. NAUSEA           BP 138/97  Pulse 111  Temp 98.4 F (36.9 C) (Oral)  Resp 24  Ht 5\' 1"  (1.549 m)  Wt 160 lb (72.576 kg)  BMI 30.23 kg/m2  SpO2 100%  LMP 05/05/2012  Physical Exam  Constitutional: She is oriented to person, place, and time. She appears well-developed and well-nourished.  HENT:  Head: Normocephalic and atraumatic.  Right Ear: External ear normal.  Left Ear: External ear normal.  Nose: Nose normal.  Mouth/Throat: Oropharynx is clear and moist.  Eyes: Conjunctivae normal and EOM are normal. Pupils are equal, round, and reactive to light.  Neck: Normal range of motion. Neck supple.  Cardiovascular: Normal rate, regular rhythm and normal heart sounds.   Pulmonary/Chest: Effort normal and breath sounds normal.  Abdominal: Soft.  Musculoskeletal: Normal range of motion.  Neurological: She is alert and oriented to person, place, and time. She has normal reflexes.  Skin: Skin is warm.  Psychiatric: She has a normal mood and affect.    ED Course  Procedures (including critical care time)  Labs Reviewed - No data to display No results found.   No diagnosis found.    MDM    Pt reports the last time she was seen here phenergan and  dilaudid were the only medication that worked for her.   Pt given Im injection.   Pt complained of itching.  Pt advised to follow up with her doctor for recheck next week.      Lonia Skinner [place], [place] 05/23/12 2054

## 2012-05-23 NOTE — ED Notes (Signed)
Patient states she has a history of cluster headaches and will have a continuous headache that can last for up to 6 months at a time.  Uses Fioricet at home.  Was seen by neuro md in  and given many tests with no luck in treating the migraines.  States this headache has lasted for one and one half months.

## 2012-05-23 NOTE — ED Notes (Signed)
Pt present with c/o ? Allergic reaction to antibiotics (amoxicillin)  Pt was seen by PCP for right swollen area to neck and was sent home with prescription antibiotics.  Pt reports chills and fever.  Pt aaox3.  Pt reports pain to right side of neck.  Pt reports "difficulty breathing"  NADN.

## 2012-05-23 NOTE — ED Notes (Signed)
Pt c/o itching, PA made aware.

## 2012-05-26 NOTE — ED Provider Notes (Signed)
Medical screening examination/treatment/procedure(s) were performed by non-physician practitioner and as supervising physician I was immediately available for consultation/collaboration.  Raeford Razor, MD 05/26/12 [phone]

## 2012-06-26 ENCOUNTER — Emergency Department (HOSPITAL_COMMUNITY)
Admission: EM | Admit: 2012-06-26 | Discharge: 2012-06-26 | Disposition: A | Discharge status: Home / Self Care | Payer: Self-pay | Attending: Emergency Medicine | Admitting: Emergency Medicine

## 2012-06-26 ENCOUNTER — Encounter (HOSPITAL_COMMUNITY): Payer: Self-pay | Admitting: *Deleted

## 2012-06-26 ENCOUNTER — Emergency Department (HOSPITAL_COMMUNITY): Payer: Self-pay

## 2012-06-26 DIAGNOSIS — Z8742 Personal history of other diseases of the female genital tract: Secondary | ICD-10-CM | POA: Insufficient documentation

## 2012-06-26 DIAGNOSIS — Z79899 Other long term (current) drug therapy: Secondary | ICD-10-CM | POA: Insufficient documentation

## 2012-06-26 DIAGNOSIS — Z8659 Personal history of other mental and behavioral disorders: Secondary | ICD-10-CM | POA: Insufficient documentation

## 2012-06-26 DIAGNOSIS — G44009 Cluster headache syndrome, unspecified, not intractable: Secondary | ICD-10-CM | POA: Insufficient documentation

## 2012-06-26 DIAGNOSIS — Z792 Long term (current) use of antibiotics: Secondary | ICD-10-CM | POA: Insufficient documentation

## 2012-06-26 DIAGNOSIS — Z794 Long term (current) use of insulin: Secondary | ICD-10-CM | POA: Insufficient documentation

## 2012-06-26 DIAGNOSIS — J069 Acute upper respiratory infection, unspecified: Secondary | ICD-10-CM | POA: Insufficient documentation

## 2012-06-26 DIAGNOSIS — M109 Gout, unspecified: Secondary | ICD-10-CM | POA: Insufficient documentation

## 2012-06-26 DIAGNOSIS — E109 Type 1 diabetes mellitus without complications: Secondary | ICD-10-CM | POA: Insufficient documentation

## 2012-06-26 LAB — CBC WITH DIFFERENTIAL/PLATELET
Basophils Absolute: 0 10*3/uL (ref 0.0–0.1)
Eosinophils Relative: 3 % (ref 0–5)
HCT: 39.8 % (ref 36.0–46.0)
Lymphocytes Relative: 41 % (ref 12–46)
Lymphs Abs: 3.5 10*3/uL (ref 0.7–4.0)
MCV: 86.1 fL (ref 78.0–100.0)
Monocytes Absolute: 0.4 10*3/uL (ref 0.1–1.0)
Neutro Abs: 4.3 10*3/uL (ref 1.7–7.7)
RBC: 4.62 MIL/uL (ref 3.87–5.11)
RDW: 13 % (ref 11.5–15.5)
WBC: 8.4 10*3/uL (ref 4.0–10.5)

## 2012-06-26 LAB — BASIC METABOLIC PANEL
CO2: 22 mEq/L (ref 19–32)
Chloride: 99 mEq/L (ref 96–112)
Creatinine, Ser: 0.69 mg/dL (ref 0.50–1.10)
Glucose, Bld: 342 mg/dL — ABNORMAL HIGH (ref 70–99)
Sodium: 134 mEq/L — ABNORMAL LOW (ref 135–145)

## 2012-06-26 LAB — GLUCOSE, CAPILLARY

## 2012-06-26 LAB — POCT I-STAT, CHEM 8
BUN: 14 mg/dL (ref 6–23)
Creatinine, Ser: 0.7 mg/dL (ref 0.50–1.10)
Glucose, Bld: 323 mg/dL — ABNORMAL HIGH (ref 70–99)
Potassium: 3.7 mEq/L (ref 3.5–5.1)
Sodium: 137 mEq/L (ref 135–145)

## 2012-06-26 MED ORDER — NAPROXEN 500 MG PO TABS: 500.0000 mg | ORAL_TABLET | Freq: Two times a day (BID) | ORAL | Status: DC

## 2012-06-26 MED ORDER — OXYCODONE-ACETAMINOPHEN 5-325 MG PO TABS: 1.0000 | ORAL_TABLET | ORAL | Status: DC | PRN

## 2012-06-26 MED ORDER — KETOROLAC TROMETHAMINE 30 MG/ML IJ SOLN
30.0000 mg | Freq: Once | INTRAMUSCULAR | Status: AC
  Administered 2012-06-26: 30 mg via INTRAVENOUS
  Filled 2012-06-26: qty 1

## 2012-06-26 MED ORDER — SODIUM CHLORIDE 0.9 % IV BOLUS (SEPSIS)
1000.0000 mL | Freq: Once | INTRAVENOUS | Status: AC
  Administered 2012-06-26: 1000 mL via INTRAVENOUS

## 2012-06-26 NOTE — ED Notes (Signed)
Pt c/o pain x 2 days between right great toe and second toe running up leg; states more severe tonight; no obvious redness or injury; cbg 439 in triage

## 2012-06-26 NOTE — ED Notes (Signed)
Patient is alert and oriented x3.  She was given DC instructions and follow up visit instructions.  Patient gave verbal understanding. She was DC ambulatory under her own power to home.  V/S stable.  He was not showing any signs of distress on DC 

## 2012-06-26 NOTE — ED Provider Notes (Signed)
Medical screening examination/treatment/procedure(s) were performed by non-physician practitioner and as supervising physician I was immediately available for consultation/collaboration. Devoria Albe, MD, Armando Gang   Ward Givens, MD 06/26/12 2300

## 2012-06-26 NOTE — ED Provider Notes (Signed)
History     CSN: [id number]  Arrival date & time 06/26/12  0225   First MD Initiated Contact with Patient 06/26/12 0258      Chief Complaint  Patient presents with  . Toe Pain    HPI  History provided by the patient. Patient is a 36 year old female with type 1 diabetes who presents with complaints of increasing pain of her right foot and toes. Symptoms have been increasing for the last 2 days. Patient denies any known injury or trauma to the foot. Pain is located between the first and second toes. She has not noticed any swelling or skin changes. Pain radiates up the foot and to the mid calf area. Patient also reports having recent URI symptoms. She has been taking a Z-Pak for this and reports some improvements. She denies any recent fever, chills or sweats. Symptoms today have been associated with some increased blood sugar. Patient states this was due to miscalculation of her carbohydrates at dinner. She has taken an additional 9 units of insulin prior to arrival. Denies any nausea vomiting or abdominal pain symptoms.    Past Medical History  Diagnosis Date  . Ovarian cyst   . Diabetes mellitus   . Depression   . Cluster headache syndrome, intractable     Past Surgical History  Procedure Date  . Appendectomy   . Cholecystectomy   . Ovarian cyst removal     No family history on file.  History  Substance Use Topics  . Smoking status: Never Smoker   . Smokeless tobacco: Not on file  . Alcohol Use: No    OB History    Grav Para Term Preterm Abortions TAB SAB Ect Mult Living                  Review of Systems  Constitutional: Negative for fever and chills.  Respiratory: Negative for shortness of breath.   Cardiovascular: Negative for chest pain.  Musculoskeletal:       Toe and foot pain  Neurological: Negative for weakness and numbness.  All other systems reviewed and are negative.    Allergies  Sulfa antibiotics; Wellbutrin; Amoxicillin; and Codeine  Home  Medications   Current Outpatient Rx  Name  Route  Sig  Dispense  Refill  . AZITHROMYCIN 250 MG PO TABS   Oral   Take 250 mg by mouth daily.         Marland Kitchen BUTALBITAL-APAP-CAFFEINE 50-325-40 MG PO TABS   Oral   Take 1-2 tablets by mouth every 6 (six) hours as needed for headache.   20 tablet   0   . IBUPROFEN 200 MG PO TABS   Oral   Take 400 mg by mouth every 6 (six) hours as needed. For pain         . INSULIN ASPART 100 UNIT/ML [place] SOLN   Subcutaneous   Inject 10-20 Units into the skin 3 (three) times daily. PT USES SLIDING SCALE.         Marland Kitchen INSULIN GLARGINE 100 UNIT/ML [place] SOLN   Subcutaneous   Inject 30 Units into the skin every morning.          . ADULT MULTIVITAMIN W/MINERALS CH   Oral   Take 1 tablet by mouth daily.         Marland Kitchen ONDANSETRON 4 MG PO TBDP   Oral   Take 1 tablet (4 mg total) by mouth every 8 (eight) hours as needed for nausea.   20 tablet  0     BP 151/75  Pulse 111  Temp 98.7 F (37.1 C)  Resp 20  SpO2 99%  LMP 05/27/2012  Physical Exam  Nursing note and vitals reviewed. Constitutional: She is oriented to person, place, and time. She appears well-developed and well-nourished. No distress.  HENT:  Head: Normocephalic.  Cardiovascular: Regular rhythm.  Tachycardia present.   Pulmonary/Chest: Effort normal and breath sounds normal. No respiratory distress. She has no wheezes. She has no rales.  Abdominal: Soft.  Musculoskeletal:       Patient reports mild tenderness to the web spacing between the right first and second toes. There are no signs of skin trauma or injury. The skin is normal color, temperature and moist. No dry or peeling skin. No pain over the joints of the toes. Normal dorsal pedal pulses. No swelling of the ankles or lower leg.  Neurological: She is alert and oriented to person, place, and time.  Skin: Skin is warm and dry. No rash noted.  Psychiatric: She has a normal mood and affect. Her behavior is normal.    ED Course   Procedures   Results for orders placed during the hospital encounter of 06/26/12  GLUCOSE, CAPILLARY      Component Value Range   Glucose-Capillary 439 (*) 70 - 99 mg/dL   Comment 1 Documented in Chart    CBC WITH DIFFERENTIAL      Component Value Range   WBC 8.4  4.0 - 10.5 K/uL   RBC 4.62  3.87 - 5.11 MIL/uL   Hemoglobin 13.7  12.0 - 15.0 g/dL   HCT 46.9  62.9 - 52.8 %   MCV 86.1  78.0 - 100.0 fL   MCH 29.7  26.0 - 34.0 pg   MCHC 34.4  30.0 - 36.0 g/dL   RDW 41.3  24.4 - 01.0 %   Platelets 373  150 - 400 K/uL   Neutrophils Relative 51  43 - 77 %   Neutro Abs 4.3  1.7 - 7.7 K/uL   Lymphocytes Relative 41  12 - 46 %   Lymphs Abs 3.5  0.7 - 4.0 K/uL   Monocytes Relative 5  3 - 12 %   Monocytes Absolute 0.4  0.1 - 1.0 K/uL   Eosinophils Relative 3  0 - 5 %   Eosinophils Absolute 0.2  0.0 - 0.7 K/uL   Basophils Relative 0  0 - 1 %   Basophils Absolute 0.0  0.0 - 0.1 K/uL  BASIC METABOLIC PANEL      Component Value Range   Sodium 134 (*) 135 - 145 mEq/L   Potassium 3.7  3.5 - 5.1 mEq/L   Chloride 99  96 - 112 mEq/L   CO2 22  19 - 32 mEq/L   Glucose, Bld 342 (*) 70 - 99 mg/dL   BUN 13  6 - 23 mg/dL   Creatinine, Ser 2.72  0.50 - 1.10 mg/dL   Calcium 9.4  8.4 - 53.6 mg/dL   GFR calc non Af Amer >90  >90 mL/min   GFR calc Af Amer >90  >90 mL/min  POCT I-STAT, CHEM 8      Component Value Range   Sodium 137  135 - 145 mEq/L   Potassium 3.7  3.5 - 5.1 mEq/L   Chloride 108  96 - 112 mEq/L   BUN 14  6 - 23 mg/dL   Creatinine, Ser 6.44  0.50 - 1.10 mg/dL   Glucose, Bld 034 (*) 70 -  99 mg/dL   Calcium, Ion 4.54  0.98 - 1.23 mmol/L   TCO2 22  0 - 100 mmol/L   Hemoglobin 14.3  12.0 - 15.0 g/dL   HCT 11.9  14.7 - 82.9 %  GLUCOSE, CAPILLARY      Component Value Range   Glucose-Capillary 197 (*) 70 - 99 mg/dL   Comment 1 Notify RN     Comment 2 Documented in Chart          Dg Foot Complete Right  06/26/2012  *RADIOLOGY REPORT*  Clinical Data: Sudden onset of right foot  pain, between the first and second toes.  RIGHT FOOT COMPLETE - 3+ VIEW  Comparison: None.  Findings: There is no evidence of fracture or dislocation.  The joint spaces are preserved.  There is no evidence of talar subluxation; the subtalar joint is unremarkable in appearance.  The patient is status post osteotomy for prior hallux valgus.  No significant soft tissue abnormalities are seen.  No radiopaque foreign bodies identified.  IMPRESSION:  1.  No evidence of fracture or dislocation; changes of prior osteotomy for hallux valgus. 2.  No radiopaque foreign bodies seen.   Original Report Authenticated By: Tonia Ghent, M.D.      1. Gout       MDM  Patient seen and evaluated. Patient appears in mild discomfort but no acute distress. Patient does have elevated blood sugar. Will check basic labs to rule out DKA. No significant findings on exam of foot and toes.  No prior history of gout. No significant signs of swelling or erythema over the joints.  Sugar has continued to improve. No CT findings on x-ray. At this time have presumptive diagnosis of possible gout versus other arthritic type process. Patient has had bunion surgery to the foot and past injuries which made pedis posterior to form of arthritis. No other signs concerning for other etiology to explain to pain.   Angus Seller, [place] 06/26/12 2132

## 2012-07-13 ENCOUNTER — Emergency Department (HOSPITAL_BASED_OUTPATIENT_CLINIC_OR_DEPARTMENT_OTHER)
Admission: EM | Admit: 2012-07-13 | Discharge: 2012-07-13 | Disposition: A | Discharge status: Home / Self Care | Payer: BC Managed Care – PPO | Attending: Emergency Medicine | Admitting: Emergency Medicine

## 2012-07-13 ENCOUNTER — Encounter (HOSPITAL_BASED_OUTPATIENT_CLINIC_OR_DEPARTMENT_OTHER): Payer: Self-pay | Admitting: Emergency Medicine

## 2012-07-13 DIAGNOSIS — E119 Type 2 diabetes mellitus without complications: Secondary | ICD-10-CM | POA: Insufficient documentation

## 2012-07-13 DIAGNOSIS — Z8659 Personal history of other mental and behavioral disorders: Secondary | ICD-10-CM | POA: Insufficient documentation

## 2012-07-13 DIAGNOSIS — Z794 Long term (current) use of insulin: Secondary | ICD-10-CM | POA: Insufficient documentation

## 2012-07-13 DIAGNOSIS — N390 Urinary tract infection, site not specified: Secondary | ICD-10-CM | POA: Insufficient documentation

## 2012-07-13 DIAGNOSIS — Z791 Long term (current) use of non-steroidal anti-inflammatories (NSAID): Secondary | ICD-10-CM | POA: Insufficient documentation

## 2012-07-13 DIAGNOSIS — Z3202 Encounter for pregnancy test, result negative: Secondary | ICD-10-CM | POA: Insufficient documentation

## 2012-07-13 DIAGNOSIS — G44009 Cluster headache syndrome, unspecified, not intractable: Secondary | ICD-10-CM | POA: Insufficient documentation

## 2012-07-13 DIAGNOSIS — Z79899 Other long term (current) drug therapy: Secondary | ICD-10-CM | POA: Insufficient documentation

## 2012-07-13 DIAGNOSIS — Z8742 Personal history of other diseases of the female genital tract: Secondary | ICD-10-CM | POA: Insufficient documentation

## 2012-07-13 LAB — URINALYSIS, ROUTINE W REFLEX MICROSCOPIC
Glucose, UA: NEGATIVE mg/dL
Protein, ur: 100 mg/dL — AB
Specific Gravity, Urine: 1.008 (ref 1.005–1.030)
Urobilinogen, UA: 0.2 mg/dL (ref 0.0–1.0)

## 2012-07-13 LAB — URINE MICROSCOPIC-ADD ON

## 2012-07-13 MED ORDER — CIPROFLOXACIN HCL 500 MG PO TABS
500.0000 mg | ORAL_TABLET | Freq: Once | ORAL | Status: AC
  Administered 2012-07-13: 500 mg via ORAL
  Filled 2012-07-13: qty 1

## 2012-07-13 MED ORDER — OXYCODONE-ACETAMINOPHEN 5-325 MG PO TABS: 1.0000 | ORAL_TABLET | Freq: Four times a day (QID) | ORAL | Status: DC | PRN

## 2012-07-13 MED ORDER — CIPROFLOXACIN HCL 500 MG PO TABS: 500.0000 mg | ORAL_TABLET | Freq: Two times a day (BID) | ORAL | Status: DC

## 2012-07-13 NOTE — ED Provider Notes (Signed)
History     CSN: [id number]  Arrival date & time 07/13/12  1232   First MD Initiated Contact with Patient 07/13/12 1338      Chief Complaint  Patient presents with  . Dysuria    (Consider location/radiation/quality/duration/timing/severity/associated sxs/prior treatment) HPI Pt presents with c/o dysuria.  She states over the past 2 days she has had burning with urination.  Also some frequency and urgency.  She denies fever/chills, no back pain.  No vomiting or diarrhea.  Denies abdominal pain.  States her last UTI was several months ago.  There are no other associated systemic symptoms, there are no other alleviating or modifying factors.   Past Medical History  Diagnosis Date  . Ovarian cyst   . Diabetes mellitus   . Depression   . Cluster headache syndrome, intractable     Past Surgical History  Procedure Date  . Appendectomy   . Cholecystectomy   . Ovarian cyst removal     No family history on file.  History  Substance Use Topics  . Smoking status: Never Smoker   . Smokeless tobacco: Not on file  . Alcohol Use: No    OB History    Grav Para Term Preterm Abortions TAB SAB Ect Mult Living                  Review of Systems ROS reviewed and all otherwise negative except for mentioned in HPI  Allergies  Sulfa antibiotics; Wellbutrin; Amoxicillin; and Codeine  Home Medications   Current Outpatient Rx  Name  Route  Sig  Dispense  Refill  . ETONOGESTREL-ETHINYL ESTRADIOL 0.12-0.015 MG/24HR [place] RING   Vaginal   Place 1 each vaginally every 28 (twenty-eight) days. Insert vaginally and leave in place for 3 consecutive weeks, then remove for 1 week.         Marland Kitchen BUTALBITAL-APAP-CAFFEINE 50-325-40 MG PO TABS   Oral   Take 1-2 tablets by mouth every 6 (six) hours as needed for headache.   20 tablet   0   . CIPROFLOXACIN HCL 500 MG PO TABS   Oral   Take 1 tablet (500 mg total) by mouth every 12 (twelve) hours.   10 tablet   0   . IBUPROFEN 200 MG PO TABS   Oral   Take 400 mg by mouth every 6 (six) hours as needed. For pain         . INSULIN ASPART 100 UNIT/ML [place] SOLN   Subcutaneous   Inject 10-20 Units into the skin 3 (three) times daily. PT USES SLIDING SCALE.         Marland Kitchen INSULIN GLARGINE 100 UNIT/ML [place] SOLN   Subcutaneous   Inject 30 Units into the skin every morning.          . ADULT MULTIVITAMIN W/MINERALS CH   Oral   Take 1 tablet by mouth daily.         . OXYCODONE-ACETAMINOPHEN 5-325 MG PO TABS   Oral   Take 1-2 tablets by mouth every 6 (six) hours as needed for pain.   15 tablet   0     BP 135/79  Pulse 92  Temp 98.3 F (36.8 C) (Oral)  Resp 18  Ht 5\' 1"  (1.549 m)  Wt 160 lb (72.576 kg)  BMI 30.23 kg/m2  SpO2 99%  LMP 05/16/2012 Vitals reviewed Physical Exam Physical Examination: General appearance - alert, well appearing, and in no distress Mental status - alert, oriented to person, place, and  time Eyes - no scleral icterus, no conjunctival injection Mouth - mucous membranes moist, pharynx normal without lesions Chest - clear to auscultation, no wheezes, rales or rhonchi, symmetric air entry Heart - normal rate, regular rhythm, normal S1, S2, no murmurs, rubs, clicks or gallops Abdomen - soft, nontender, nondistended, no masses or organomegaly Extremities - peripheral pulses normal, no pedal edema, no clubbing or cyanosis Skin - normal coloration and turgor, no rashes  ED Course  Procedures (including critical care time)  Labs Reviewed  URINALYSIS, ROUTINE W REFLEX MICROSCOPIC - Abnormal; Notable for the following:    Color, Urine AMBER (*)  BIOCHEMICALS MAY BE AFFECTED BY COLOR   APPearance CLOUDY (*)     Hgb urine dipstick LARGE (*)     Protein, ur 100 (*)     Nitrite POSITIVE (*)     Leukocytes, UA MODERATE (*)     All other components within normal limits  URINE MICROSCOPIC-ADD ON - Abnormal; Notable for the following:    Bacteria, UA FEW (*)     All other components within normal limits    PREGNANCY, URINE  URINE CULTURE   No results found.   1. Urinary tract infection       MDM  Pt presenting with c/o dysuria, urinary frequency.  No fever/chills, no abdominal pain.  Some low back pain.  Pt has u/a c/w urinary tract infection.  Pt started on ciprofloxacin.  Will also discharge with rx for pain medication.  Discharged with strict return precautions.  Pt agreeable with plan.        Ethelda Chick, MD 07/14/12 1242

## 2012-07-13 NOTE — ED Notes (Signed)
Pt having UTI symptoms x 2 days.  No known fever.  No vaginal discharge.

## 2012-07-15 ENCOUNTER — Emergency Department (HOSPITAL_BASED_OUTPATIENT_CLINIC_OR_DEPARTMENT_OTHER)
Admission: EM | Admit: 2012-07-15 | Discharge: 2012-07-15 | Disposition: A | Discharge status: Home / Self Care | Payer: BC Managed Care – PPO | Attending: Emergency Medicine | Admitting: Emergency Medicine

## 2012-07-15 ENCOUNTER — Encounter (HOSPITAL_BASED_OUTPATIENT_CLINIC_OR_DEPARTMENT_OTHER): Payer: Self-pay

## 2012-07-15 DIAGNOSIS — E119 Type 2 diabetes mellitus without complications: Secondary | ICD-10-CM | POA: Insufficient documentation

## 2012-07-15 DIAGNOSIS — Z79899 Other long term (current) drug therapy: Secondary | ICD-10-CM | POA: Insufficient documentation

## 2012-07-15 DIAGNOSIS — R300 Dysuria: Secondary | ICD-10-CM | POA: Insufficient documentation

## 2012-07-15 DIAGNOSIS — Z3202 Encounter for pregnancy test, result negative: Secondary | ICD-10-CM | POA: Insufficient documentation

## 2012-07-15 DIAGNOSIS — Z794 Long term (current) use of insulin: Secondary | ICD-10-CM | POA: Insufficient documentation

## 2012-07-15 DIAGNOSIS — B009 Herpesviral infection, unspecified: Secondary | ICD-10-CM

## 2012-07-15 DIAGNOSIS — Z8679 Personal history of other diseases of the circulatory system: Secondary | ICD-10-CM | POA: Insufficient documentation

## 2012-07-15 DIAGNOSIS — Z8659 Personal history of other mental and behavioral disorders: Secondary | ICD-10-CM | POA: Insufficient documentation

## 2012-07-15 DIAGNOSIS — Z8742 Personal history of other diseases of the female genital tract: Secondary | ICD-10-CM | POA: Insufficient documentation

## 2012-07-15 LAB — URINE CULTURE: Colony Count: 60000

## 2012-07-15 LAB — URINALYSIS, ROUTINE W REFLEX MICROSCOPIC
Bilirubin Urine: NEGATIVE
Ketones, ur: NEGATIVE mg/dL
Nitrite: NEGATIVE
Urobilinogen, UA: 0.2 mg/dL (ref 0.0–1.0)

## 2012-07-15 LAB — PREGNANCY, URINE: Preg Test, Ur: NEGATIVE

## 2012-07-15 MED ORDER — ACYCLOVIR 800 MG PO TABS: 800.0000 mg | ORAL_TABLET | Freq: Two times a day (BID) | ORAL | Status: DC

## 2012-07-15 MED ORDER — ONDANSETRON 4 MG PO TBDP
4.0000 mg | ORAL_TABLET | Freq: Once | ORAL | Status: AC
  Administered 2012-07-15: 4 mg via ORAL
  Filled 2012-07-15: qty 1

## 2012-07-15 NOTE — ED Notes (Signed)
Pt reports she was seen Sunday and diagnosed with a UTI, is taking PO antibiotics but not improving.  Pt c/o back pain, nausea, vomiting and a herpes "outbreak".

## 2012-07-15 NOTE — ED Provider Notes (Signed)
History     CSN: [id number]  Arrival date & time 07/15/12  1728   First MD Initiated Contact with Patient 07/15/12 1750      Chief Complaint  Patient presents with  . Back Pain  . Emesis  . Urinary Frequency  . Herpes Zoster    (Consider location/radiation/quality/duration/timing/severity/associated sxs/prior treatment) HPI Comments: Pt states that she was treated for uti when she was seen 2 days ago and she feels like the symptoms aren't getting any better;pt states that she is also having vomiting:no fever:pt states that she is also having a herpes outbreak for the first time in over 1 year  Patient is a 37 y.o. female presenting with frequency. The history is provided by the patient. No language interpreter was used.  Urinary Frequency This is a recurrent problem. The current episode started in the past 7 days. The problem occurs constantly. The problem has been unchanged. Pertinent negatives include no fever. Nothing aggravates the symptoms.    Past Medical History  Diagnosis Date  . Ovarian cyst   . Diabetes mellitus   . Depression   . Cluster headache syndrome, intractable     Past Surgical History  Procedure Date  . Appendectomy   . Cholecystectomy   . Ovarian cyst removal     No family history on file.  History  Substance Use Topics  . Smoking status: Never Smoker   . Smokeless tobacco: Not on file  . Alcohol Use: No    OB History    Grav Para Term Preterm Abortions TAB SAB Ect Mult Living                  Review of Systems  Constitutional: Negative for fever.  Respiratory: Negative.   Cardiovascular: Negative.   Genitourinary: Positive for frequency.    Allergies  Sulfa antibiotics; Wellbutrin; Amoxicillin; and Codeine  Home Medications   Current Outpatient Rx  Name  Route  Sig  Dispense  Refill  . ACYCLOVIR 800 MG PO TABS   Oral   Take 1 tablet (800 mg total) by mouth 2 (two) times daily.   10 tablet   0   . BUTALBITAL-APAP-CAFFEINE  50-325-40 MG PO TABS   Oral   Take 1-2 tablets by mouth every 6 (six) hours as needed for headache.   20 tablet   0   . CIPROFLOXACIN HCL 500 MG PO TABS   Oral   Take 1 tablet (500 mg total) by mouth every 12 (twelve) hours.   10 tablet   0   . ETONOGESTREL-ETHINYL ESTRADIOL 0.12-0.015 MG/24HR [place] RING   Vaginal   Place 1 each vaginally every 28 (twenty-eight) days. Insert vaginally and leave in place for 3 consecutive weeks, then remove for 1 week.         . IBUPROFEN 200 MG PO TABS   Oral   Take 400 mg by mouth every 6 (six) hours as needed. For pain         . INSULIN ASPART 100 UNIT/ML [place] SOLN   Subcutaneous   Inject 10-20 Units into the skin 3 (three) times daily. PT USES SLIDING SCALE.         Marland Kitchen INSULIN GLARGINE 100 UNIT/ML [place] SOLN   Subcutaneous   Inject 30 Units into the skin every morning.          . ADULT MULTIVITAMIN W/MINERALS CH   Oral   Take 1 tablet by mouth daily.         Marland Kitchen  OXYCODONE-ACETAMINOPHEN 5-325 MG PO TABS   Oral   Take 1-2 tablets by mouth every 6 (six) hours as needed for pain.   15 tablet   0     BP 161/86  Pulse 98  Temp 99.3 F (37.4 C) (Oral)  Resp 18  Ht 5\' 1"  (1.549 m)  Wt 160 lb (72.576 kg)  BMI 30.23 kg/m2  SpO2 98%  LMP 07/05/2012  Physical Exam  Nursing note and vitals reviewed. Constitutional: She is oriented to person, place, and time. She appears well-developed and well-nourished.  Cardiovascular: Normal rate and regular rhythm.   Pulmonary/Chest: Effort normal and breath sounds normal.  Abdominal: Bowel sounds are normal. There is no tenderness.  Genitourinary:       Pt has ulcerated area to labia  Neurological: She is alert and oriented to person, place, and time.  Skin: Skin is warm and dry.    ED Course  Procedures (including critical care time)  Labs Reviewed  URINALYSIS, ROUTINE W REFLEX MICROSCOPIC - Abnormal; Notable for the following:    Specific Gravity, Urine 1.035 (*)     Glucose, UA >1000  (*)     All other components within normal limits  PREGNANCY, URINE  URINE MICROSCOPIC-ADD ON   No results found.   1. Herpes   2. Dysuria       MDM  Pt no longer has uti:pt denying vaginal discharge:discussed with pt that we would treat for recurrent herpes outbreak:pts states that she has zofran at home        Teressa Lower, NP 07/15/12 1847

## 2012-07-16 NOTE — ED Notes (Signed)
+  Urine. Patient treated with Cipro. Sensitive to same. Per protocol MD. °

## 2012-07-16 NOTE — ED Provider Notes (Signed)
Medical screening examination/treatment/procedure(s) were performed by non-physician practitioner and as supervising physician I was immediately available for consultation/collaboration.   Carleene Cooper III, MD 07/16/12 [phone]

## 2012-07-20 ENCOUNTER — Emergency Department (HOSPITAL_COMMUNITY)
Admission: EM | Admit: 2012-07-20 | Discharge: 2012-07-21 | Disposition: A | Discharge status: Home / Self Care | Payer: BC Managed Care – PPO | Attending: Emergency Medicine | Admitting: Emergency Medicine

## 2012-07-20 ENCOUNTER — Emergency Department (HOSPITAL_COMMUNITY): Payer: BC Managed Care – PPO

## 2012-07-20 DIAGNOSIS — R300 Dysuria: Secondary | ICD-10-CM | POA: Insufficient documentation

## 2012-07-20 DIAGNOSIS — Z8742 Personal history of other diseases of the female genital tract: Secondary | ICD-10-CM | POA: Insufficient documentation

## 2012-07-20 DIAGNOSIS — R51 Headache: Secondary | ICD-10-CM | POA: Insufficient documentation

## 2012-07-20 DIAGNOSIS — Z8659 Personal history of other mental and behavioral disorders: Secondary | ICD-10-CM | POA: Insufficient documentation

## 2012-07-20 DIAGNOSIS — R739 Hyperglycemia, unspecified: Secondary | ICD-10-CM

## 2012-07-20 DIAGNOSIS — R112 Nausea with vomiting, unspecified: Secondary | ICD-10-CM | POA: Insufficient documentation

## 2012-07-20 DIAGNOSIS — M549 Dorsalgia, unspecified: Secondary | ICD-10-CM | POA: Insufficient documentation

## 2012-07-20 DIAGNOSIS — Z79899 Other long term (current) drug therapy: Secondary | ICD-10-CM | POA: Insufficient documentation

## 2012-07-20 DIAGNOSIS — R109 Unspecified abdominal pain: Secondary | ICD-10-CM | POA: Insufficient documentation

## 2012-07-20 DIAGNOSIS — N83209 Unspecified ovarian cyst, unspecified side: Secondary | ICD-10-CM

## 2012-07-20 DIAGNOSIS — Z794 Long term (current) use of insulin: Secondary | ICD-10-CM | POA: Insufficient documentation

## 2012-07-20 DIAGNOSIS — N390 Urinary tract infection, site not specified: Secondary | ICD-10-CM | POA: Insufficient documentation

## 2012-07-20 DIAGNOSIS — E1169 Type 2 diabetes mellitus with other specified complication: Secondary | ICD-10-CM | POA: Insufficient documentation

## 2012-07-20 LAB — COMPREHENSIVE METABOLIC PANEL
AST: 10 U/L (ref 0–37)
Albumin: 3.9 g/dL (ref 3.5–5.2)
Alkaline Phosphatase: 56 U/L (ref 39–117)
BUN: 11 mg/dL (ref 6–23)
Chloride: 95 mEq/L — ABNORMAL LOW (ref 96–112)
Potassium: 3.9 mEq/L (ref 3.5–5.1)
Total Bilirubin: 0.4 mg/dL (ref 0.3–1.2)
Total Protein: 7.5 g/dL (ref 6.0–8.3)

## 2012-07-20 LAB — GLUCOSE, CAPILLARY: Glucose-Capillary: 222 mg/dL — ABNORMAL HIGH (ref 70–99)

## 2012-07-20 LAB — URINALYSIS, ROUTINE W REFLEX MICROSCOPIC
Glucose, UA: >1000 mg/dL — AB
Hgb urine dipstick: NEGATIVE
Leukocytes, UA: NEGATIVE
Protein, ur: NEGATIVE mg/dL
pH: 6 (ref 5.0–8.0)

## 2012-07-20 LAB — CBC WITH DIFFERENTIAL/PLATELET
Basophils Absolute: 0 10*3/uL (ref 0.0–0.1)
Eosinophils Relative: 1 % (ref 0–5)
HCT: 39.3 % (ref 36.0–46.0)
Hemoglobin: 13.5 g/dL (ref 12.0–15.0)
Lymphocytes Relative: 26 % (ref 12–46)
Lymphs Abs: 2.5 10*3/uL (ref 0.7–4.0)
MCV: 86.6 fL (ref 78.0–100.0)
Monocytes Absolute: 0.3 10*3/uL (ref 0.1–1.0)
Monocytes Relative: 3 % (ref 3–12)
RBC: 4.54 MIL/uL (ref 3.87–5.11)
WBC: 9.7 10*3/uL (ref 4.0–10.5)

## 2012-07-20 LAB — PREGNANCY, URINE: Preg Test, Ur: NEGATIVE

## 2012-07-20 MED ORDER — INSULIN ASPART 100 UNIT/ML ~~LOC~~ SOLN
10.0000 [IU] | Freq: Once | SUBCUTANEOUS | Status: AC
  Administered 2012-07-20: 10 [IU] via INTRAVENOUS
  Filled 2012-07-20: qty 10

## 2012-07-20 MED ORDER — ONDANSETRON HCL 4 MG/2ML IJ SOLN
4.0000 mg | Freq: Once | INTRAMUSCULAR | Status: AC
  Administered 2012-07-20: 4 mg via INTRAVENOUS
  Filled 2012-07-20: qty 2

## 2012-07-20 MED ORDER — KETOROLAC TROMETHAMINE 30 MG/ML IJ SOLN
30.0000 mg | Freq: Once | INTRAMUSCULAR | Status: AC
  Administered 2012-07-20: 30 mg via INTRAVENOUS
  Filled 2012-07-20: qty 1

## 2012-07-20 MED ORDER — SODIUM CHLORIDE 0.9 % IV BOLUS (SEPSIS)
1000.0000 mL | Freq: Once | INTRAVENOUS | Status: AC
  Administered 2012-07-20: 1000 mL via INTRAVENOUS

## 2012-07-20 MED ORDER — MORPHINE SULFATE 4 MG/ML IJ SOLN
4.0000 mg | Freq: Once | INTRAMUSCULAR | Status: AC
  Administered 2012-07-20: 4 mg via INTRAVENOUS
  Filled 2012-07-20: qty 1

## 2012-07-20 MED ORDER — HYDROCODONE-ACETAMINOPHEN 5-325 MG PO TABS: 2.0000 | ORAL_TABLET | ORAL | Status: DC | PRN

## 2012-07-20 MED ORDER — MORPHINE SULFATE 4 MG/ML IJ SOLN
4.0000 mg | Freq: Once | INTRAMUSCULAR | Status: AC
  Administered 2012-07-21: 4 mg via INTRAVENOUS
  Filled 2012-07-20: qty 1

## 2012-07-20 NOTE — ED Notes (Signed)
Pt states that she has been having a UTI for 2 wks for which she has taken cipro but she is still having N/V and back pain. Also states that her blood sugars have been running in the 400-500s.

## 2012-07-20 NOTE — ED Provider Notes (Signed)
History     CSN: [id number]  Arrival date & time 07/20/12  1847   First MD Initiated Contact with Patient 07/20/12 1946      Chief Complaint  Patient presents with  . Hyperglycemia  . Urinary Tract Infection    (Consider location/radiation/quality/duration/timing/severity/associated sxs/prior treatment) HPI Comments: Patient comes to the ER for evaluation of left flank and back pain. Patient reports that she was diagnosed with a urinary tract infection by her primary doctor 2 weeks ago. She finished a course of Cipro 3 days ago but continues to have symptoms. She reports that the pain in the left lower flank and back area has progressively worsened. It is now severe. She also has pain in the bladder area when she urinates. She does have diabetes and her sugars have been running 400-500.  Patient is a 37 y.o. female presenting with urinary tract infection.  Urinary Tract Infection    Past Medical History  Diagnosis Date  . Ovarian cyst   . Diabetes mellitus   . Depression   . Cluster headache syndrome, intractable     Past Surgical History  Procedure Date  . Appendectomy   . Cholecystectomy   . Ovarian cyst removal     No family history on file.  History  Substance Use Topics  . Smoking status: Never Smoker   . Smokeless tobacco: Not on file  . Alcohol Use: No    OB History    Grav Para Term Preterm Abortions TAB SAB Ect Mult Living                  Review of Systems  Constitutional: Negative for fever.  Gastrointestinal: Positive for nausea and vomiting.  Genitourinary: Positive for dysuria and flank pain.  Musculoskeletal: Positive for back pain.  All other systems reviewed and are negative.    Allergies  Sulfa antibiotics; Wellbutrin; Amoxicillin; and Codeine  Home Medications   Current Outpatient Rx  Name  Route  Sig  Dispense  Refill  . ACYCLOVIR 800 MG PO TABS   Oral   Take 800 mg by mouth 2 (two) times daily as needed. For outbreak.        Marland Kitchen BUTALBITAL-APAP-CAFFEINE 50-325-40 MG PO TABS   Oral   Take 1-2 tablets by mouth every 6 (six) hours as needed for headache.   20 tablet   0   . ETONOGESTREL-ETHINYL ESTRADIOL 0.12-0.015 MG/24HR [place] RING   Vaginal   Place 1 each vaginally every 28 (twenty-eight) days.         . IBUPROFEN 200 MG PO TABS   Oral   Take 400 mg by mouth every 8 (eight) hours as needed. For pain         . INSULIN ASPART 100 UNIT/ML [place] SOLN   Subcutaneous   Inject 10-20 Units into the skin 3 (three) times daily. PT USES SLIDING SCALE.         Marland Kitchen INSULIN GLARGINE 100 UNIT/ML [place] SOLN   Subcutaneous   Inject 32 Units into the skin every morning.          . ADULT MULTIVITAMIN W/MINERALS CH   Oral   Take 1 tablet by mouth daily.         Marland Kitchen CIPROFLOXACIN HCL 500 MG PO TABS   Oral   Take 1 tablet (500 mg total) by mouth every 12 (twelve) hours.   10 tablet   0     BP 145/79  Pulse 107  Temp 98.5 F (36.9  C) (Oral)  SpO2 100%  LMP 07/05/2012  Physical Exam  Constitutional: She is oriented to person, place, and time. She appears well-developed and well-nourished. She appears distressed.  HENT:  Head: Normocephalic and atraumatic.  Right Ear: Hearing normal.  Nose: Nose normal.  Mouth/Throat: Oropharynx is clear and moist and mucous membranes are normal.  Eyes: Conjunctivae normal and EOM are normal. Pupils are equal, round, and reactive to light.  Neck: Normal range of motion. Neck supple.  Cardiovascular: Normal rate, regular rhythm, S1 normal and S2 normal.  Exam reveals no gallop and no friction rub.   No murmur heard. Pulmonary/Chest: Effort normal and breath sounds normal. No respiratory distress. She exhibits no tenderness.  Abdominal: Soft. Normal appearance and bowel sounds are normal. There is no hepatosplenomegaly. There is no tenderness. There is no rebound, no guarding, no tenderness at McBurney's point and negative Murphy's sign. No hernia.  Musculoskeletal: Normal  range of motion.       Right shoulder: She exhibits tenderness.       Arms: Neurological: She is alert and oriented to person, place, and time. She has normal strength. No cranial nerve deficit or sensory deficit. Coordination normal. GCS eye subscore is 4. GCS verbal subscore is 5. GCS motor subscore is 6.  Skin: Skin is warm, dry and intact. No rash noted. No cyanosis.  Psychiatric: She has a normal mood and affect. Her speech is normal and behavior is normal. Thought content normal.    ED Course  Procedures (including critical care time)  Labs Reviewed  COMPREHENSIVE METABOLIC PANEL - Abnormal; Notable for the following:    Sodium 132 (*)     Chloride 95 (*)     Glucose, Bld 437 (*)     All other components within normal limits  URINALYSIS, ROUTINE W REFLEX MICROSCOPIC - Abnormal; Notable for the following:    Specific Gravity, Urine 1.033 (*)     Glucose, UA >1000 (*)     Ketones, ur TRACE (*)     All other components within normal limits  GLUCOSE, CAPILLARY - Abnormal; Notable for the following:    Glucose-Capillary 222 (*)     All other components within normal limits  CBC WITH DIFFERENTIAL  PREGNANCY, URINE  URINE MICROSCOPIC-ADD ON   Ct Abdomen Pelvis Wo Contrast  07/20/2012  *RADIOLOGY REPORT*  Clinical Data: Left-sided pain.  Urinary tract infection.  CT ABDOMEN AND PELVIS WITHOUT CONTRAST  Technique:  Multidetector CT imaging of the abdomen and pelvis was performed following the standard protocol without intravenous contrast.  Comparison: 02/15/2012  Findings: Lung bases are clear.  No evidence for free air. Gallbladder appears to be absent.  No gross abnormality to the liver, spleen, pancreas or adrenal glands.  No evidence for hydronephrosis.  There may be a punctate, 3 mm stone in the left kidney lower pole.  There is a round 3.5 x 3 x 1 cm low density structure near the left ovary.  Findings are suggestive for an ovarian cyst.  Evidence for a contraceptive ring at the  cervix.  No gross abnormality to the urinary bladder.  No significant free fluid or lymphadenopathy.  Surgical clips in the right lower quadrant of the abdomen.  No acute bony abnormality. 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  IMPRESSION: Round 3.5 cm low density structure along the left side of the pelvis.  Findings most likely represent an ovarian cyst.  Possible punctate left kidney stone without hydronephrosis.   Original Report  Authenticated By: Richarda Overlie, M.D.      Diagnosis: 1. Ovarian cyst 2. Hyperglycemia    MDM  Patient presents to the ER stating that she is currently being treated for urinary tract infection but her pain is worsened. She is concerned that she might have a kidney stone. Patient was in atrial distress at arrival but improved with IV analgesia. Blood sugar was elevated and has been treated with IV fluids and IV insulin. Last fingerstick was 222. CAT scan did not show any evidence of ureterolithiasis. Urinalysis was also clear, no sign of continued infection. CAT scan does show a 3.5 cm ovarian cyst which is likely the cause of the patient's symptoms. No evidence of hemorrhage. Patient will be discharged with analgesia, followup with OB/GYN.        Gilda Crease, MD 07/20/12 [phone]

## 2012-07-20 NOTE — ED Notes (Signed)
Pt waiting for a ride home 

## 2012-07-20 NOTE — ED Notes (Signed)
Attempt IV x2 pt outwardly crying and moaning with each stick. Pt does not tolerate well. Pt states "they never can get an IV on me." IV team paged.

## 2012-07-20 NOTE — ED Notes (Signed)
CBG 439 

## 2012-07-21 ENCOUNTER — Other Ambulatory Visit: Payer: Self-pay | Admitting: Family Medicine

## 2012-07-21 DIAGNOSIS — R109 Unspecified abdominal pain: Secondary | ICD-10-CM

## 2012-07-21 LAB — GLUCOSE, CAPILLARY: Glucose-Capillary: 439 mg/dL — ABNORMAL HIGH (ref 70–99)

## 2012-07-21 NOTE — ED Notes (Signed)
Pt has a friend coming to give her a ride home.

## 2012-08-03 ENCOUNTER — Inpatient Hospital Stay (HOSPITAL_COMMUNITY)
Admission: AD | Admit: 2012-08-03 | Discharge: 2012-08-03 | Disposition: A | Discharge status: Home / Self Care | Payer: BC Managed Care – PPO | Source: Ambulatory Visit | Attending: Obstetrics & Gynecology | Admitting: Obstetrics & Gynecology

## 2012-08-03 ENCOUNTER — Inpatient Hospital Stay (HOSPITAL_COMMUNITY): Payer: BC Managed Care – PPO

## 2012-08-03 DIAGNOSIS — E119 Type 2 diabetes mellitus without complications: Secondary | ICD-10-CM | POA: Insufficient documentation

## 2012-08-03 DIAGNOSIS — N83209 Unspecified ovarian cyst, unspecified side: Secondary | ICD-10-CM | POA: Insufficient documentation

## 2012-08-03 DIAGNOSIS — R1032 Left lower quadrant pain: Secondary | ICD-10-CM | POA: Insufficient documentation

## 2012-08-03 LAB — URINALYSIS, ROUTINE W REFLEX MICROSCOPIC
Glucose, UA: >1000 mg/dL — AB
Protein, ur: NEGATIVE mg/dL
Specific Gravity, Urine: 1.02 (ref 1.005–1.030)
pH: 5.5 (ref 5.0–8.0)

## 2012-08-03 LAB — URINE MICROSCOPIC-ADD ON

## 2012-08-03 LAB — WET PREP, GENITAL
Clue Cells Wet Prep HPF POC: NONE SEEN
Trich, Wet Prep: NONE SEEN
Yeast Wet Prep HPF POC: NONE SEEN

## 2012-08-03 LAB — POCT PREGNANCY, URINE: Preg Test, Ur: NEGATIVE

## 2012-08-03 MED ORDER — KETOROLAC TROMETHAMINE 30 MG/ML IJ SOLN
30.0000 mg | Freq: Once | INTRAMUSCULAR | Status: AC
  Administered 2012-08-03: 30 mg via INTRAMUSCULAR
  Filled 2012-08-03: qty 1

## 2012-08-03 NOTE — MAU Note (Signed)
Pt is here with left upper back pain, and left lower abdominal pain. She was seen last month at [facility] long and told her she had an ovarian cyst and a kidney stone. [facility] told her to come to [facility] hospital if the pain became worse

## 2012-08-03 NOTE — MAU Provider Note (Signed)
History     CSN: [id number]  Arrival date & time 08/03/12  1443   None     Chief Complaint  Patient presents with  . Back Pain  . Abdominal Pain    (Consider location/radiation/quality/duration/timing/severity/associated sxs/prior treatment) HPI [name] is a 37 y.o. who presents with increased L back and LLQ pain. She had [facility] ED visit early January, CT dx 3 mm punctate stone in L low kidney pole and 3.5 cm round low density in LLQ c/w ovarian cyst. She has hx ovarian cystectomy and had a cyst last summer that they were going to do surgery on , but didn't. She was hospitalized x 2 wks on pain med in [place].  She denies hematuria, UTI S&S, change in discharge,odor or itching. Past Medical History  Diagnosis Date  . Ovarian cyst   . Diabetes mellitus   . Depression   . Cluster headache syndrome, intractable     Past Surgical History  Procedure Date  . Appendectomy   . Cholecystectomy   . Ovarian cyst removal     No family history on file.  History  Substance Use Topics  . Smoking status: Never Smoker   . Smokeless tobacco: Not on file  . Alcohol Use: No    OB History    Grav Para Term Preterm Abortions TAB SAB Ect Mult Living                  Review of Systems  Constitutional: Negative for fever and chills.  Gastrointestinal: Negative for nausea, vomiting, diarrhea and constipation.  Genitourinary: Positive for urgency and flank pain. Negative for dysuria, frequency, hematuria, vaginal bleeding, vaginal discharge and difficulty urinating.  Musculoskeletal: Positive for back pain.    Allergies  Sulfa antibiotics; Wellbutrin; Amoxicillin; and Codeine  Home Medications  No current outpatient prescriptions on file.  BP 123/77  Pulse 84  Temp 98.7 F (37.1 C) (Oral)  Resp 18  LMP 07/05/2012  Physical Exam  Constitutional: She is oriented to person, place, and time. She appears well-developed and well-nourished.  Abdominal: Soft. Normal  appearance. She exhibits no mass. There is tenderness in the left lower quadrant. There is CVA tenderness. There is no rebound and no guarding.  Genitourinary:       Pelvic exam: Ext gen- nl anatomy, skin intact  Vagina- scant white discharge, Nuvaring in place, removed per pt request Cx- closed  Uterus- non tender, nl size Adn- tenderness L,   Musculoskeletal: Normal range of motion.  Neurological: She is alert and oriented to person, place, and time.  Skin: Skin is warm and dry.  Psychiatric: She has a normal mood and affect. Her behavior is normal.    ED Course  Procedures (including critical care time)  Labs Reviewed  URINALYSIS, ROUTINE W REFLEX MICROSCOPIC - Abnormal; Notable for the following:    Glucose, UA >1000 (*)     Hgb urine dipstick TRACE (*)     All other components within normal limits  URINE MICROSCOPIC-ADD ON - Abnormal; Notable for the following:    Squamous Epithelial / LPF FEW (*)     All other components within normal limits  POCT PREGNANCY, URINE   No results found. Results for orders placed during the hospital encounter of 08/03/12 (from the past 24 hour(s))  URINALYSIS, ROUTINE W REFLEX MICROSCOPIC     Status: Abnormal   Collection Time   08/03/12  3:00 PM      Component Value Range   Color, Urine  YELLOW  YELLOW   APPearance CLEAR  CLEAR   Specific Gravity, Urine 1.020  1.005 - 1.030   pH 5.5  5.0 - 8.0   Glucose, UA >1000 (*) NEGATIVE mg/dL   Hgb urine dipstick TRACE (*) NEGATIVE   Bilirubin Urine NEGATIVE  NEGATIVE   Ketones, ur NEGATIVE  NEGATIVE mg/dL   Protein, ur NEGATIVE  NEGATIVE mg/dL   Urobilinogen, UA 0.2  0.0 - 1.0 mg/dL   Nitrite NEGATIVE  NEGATIVE   Leukocytes, UA NEGATIVE  NEGATIVE  URINE MICROSCOPIC-ADD ON     Status: Abnormal   Collection Time   08/03/12  3:00 PM      Component Value Range   Squamous Epithelial / LPF FEW (*) RARE   WBC, UA 0-2  <3 WBC/hpf   RBC / HPF 0-2  <3 RBC/hpf  POCT PREGNANCY, URINE     Status: Normal    Collection Time   08/03/12  3:18 PM      Component Value Range   Preg Test, Ur NEGATIVE  NEGATIVE  WET PREP, GENITAL     Status: Abnormal   Collection Time   08/03/12  5:23 PM      Component Value Range   Yeast Wet Prep HPF POC NONE SEEN  NONE SEEN   Trich, Wet Prep NONE SEEN  NONE SEEN   Clue Cells Wet Prep HPF POC NONE SEEN  NONE SEEN   WBC, Wet Prep HPF POC MODERATE (*) NONE SEEN  GLUCOSE, CAPILLARY     Status: Abnormal   Collection Time   08/03/12  5:58 PM      Component Value Range   Glucose-Capillary 64 (*) 70 - 99 mg/dL   Comment 1 Documented in Chart     US Transvaginal Non-ob  08/03/2012  *RADIOLOGY REPORT*  Clinical Data: Left lower quadrant abdominal pain, possible ovarian cyst seen on recent abdominal CT  TRANSABDOMINAL AND TRANSVAGINAL ULTRASOUND OF PELVIS Technique:  Both transabdominal and transvaginal ultrasound examinations of the pelvis were performed. Transabdominal technique was performed for global imaging of the pelvis including uterus, ovaries, adnexal regions, and pelvic cul-de-sac.  It was necessary to proceed with endovaginal exam following the transabdominal exam to visualize the bilateral ovaries.  Comparison:  CT abdomen pelvis - 07/20/2012; pelvic ultrasound - 12/17/2011  Findings:  Uterus: Normal appearance of the anteverted uterus measuring approximately 7.8 x 2.9 x 3.8 cm.  Endometrium: Normal in appearance and thickness, measuring approximately 6.9 mm in diameter (image 22).  Right ovary:  Normal in size measuring 2.7 x 1.3 x 1.8 cm.  No discrete right-sided ovarian or adnexal mass.  Left ovary: Normal in size measuring 4.9 x 3.3 x 2.8 cm.  Note is made of an approximately 3.1 x 2.5 x 2.8 cm left-sided anechoic cyst, similar to recent abdominal CT.  Other findings: No free fluid.  IMPRESSION:  Grossly unchanged appearance of approximately 3.1 cm left-sided ovarian cyst.   This is almost certainly benign, and no specific imaging follow up is recommended according to the  Society of Radiologists in Ultrasound 2010 Consensus  Conference Statement (D Lenis Noon et al. Management of Asymptomatic Ovarian and Other Adnexal Cysts Imaged at [place]:  Society of Radiologists in Ultrasound Consensus Conference Statement 2010.  Radiology 256 (Sept 2010): 943-954.).   Original Report Authenticated By: Tacey Ruiz, MD    US Pelvis Complete  08/03/2012  *RADIOLOGY REPORT*  Clinical Data: Left lower quadrant abdominal pain, possible ovarian cyst seen on recent abdominal CT  TRANSABDOMINAL  AND TRANSVAGINAL ULTRASOUND OF PELVIS Technique:  Both transabdominal and transvaginal ultrasound examinations of the pelvis were performed. Transabdominal technique was performed for global imaging of the pelvis including uterus, ovaries, adnexal regions, and pelvic cul-de-sac.  It was necessary to proceed with endovaginal exam following the transabdominal exam to visualize the bilateral ovaries.  Comparison:  CT abdomen pelvis - 07/20/2012; pelvic ultrasound - 12/17/2011  Findings:  Uterus: Normal appearance of the anteverted uterus measuring approximately 7.8 x 2.9 x 3.8 cm.  Endometrium: Normal in appearance and thickness, measuring approximately 6.9 mm in diameter (image 22).  Right ovary:  Normal in size measuring 2.7 x 1.3 x 1.8 cm.  No discrete right-sided ovarian or adnexal mass.  Left ovary: Normal in size measuring 4.9 x 3.3 x 2.8 cm.  Note is made of an approximately 3.1 x 2.5 x 2.8 cm left-sided anechoic cyst, similar to recent abdominal CT.  Other findings: No free fluid.  IMPRESSION:  Grossly unchanged appearance of approximately 3.1 cm left-sided ovarian cyst.   This is almost certainly benign, and no specific imaging follow up is recommended according to the Society of Radiologists in Ultrasound 2010 Consensus  Conference Statement (D Lenis Noon et al. Management of Asymptomatic Ovarian and Other Adnexal Cysts Imaged at [place]:  Society of Radiologists in Ultrasound Consensus Conference Statement 2010.   Radiology 256 (Sept 2010): 943-954.).   Original Report Authenticated By: Tacey Ruiz, MD      No diagnosis found. ASSESSMENT: Ovarian cyst resolving No hematuria Pain better  PLAN: F/u with PCP @ [facility] who manages her diabetes, has urology referral this week Declined pain med  MDM

## 2012-08-03 NOTE — MAU Note (Signed)
"  I have had problems with kidneys in the past, but the ovarian cyst has been over the past couple of weeks.  I was given Percocet in the ER, but been taking Ibuprofen since being home."

## 2012-08-04 ENCOUNTER — Encounter: Payer: BC Managed Care – PPO | Admitting: Obstetrics & Gynecology

## 2012-08-04 LAB — GC/CHLAMYDIA PROBE AMP
CT Probe RNA: NEGATIVE
GC Probe RNA: NEGATIVE

## 2012-08-06 NOTE — MAU Provider Note (Signed)
Attestation of Attending Supervision of Advanced Practitioner (CNM/NP): Evaluation and management procedures were performed by the Advanced Practitioner under my supervision and collaboration. I have reviewed the Advanced Practitioner's note and chart, and I agree with the management and plan.  Kelyse Pask H. 9:39 PM

## 2012-08-14 ENCOUNTER — Encounter: Payer: BC Managed Care – PPO | Admitting: Obstetrics & Gynecology

## 2012-09-14 ENCOUNTER — Emergency Department (HOSPITAL_COMMUNITY)
Admission: EM | Admit: 2012-09-14 | Discharge: 2012-09-14 | Disposition: A | Discharge status: Home / Self Care | Payer: BC Managed Care – PPO | Attending: Emergency Medicine | Admitting: Emergency Medicine

## 2012-09-14 DIAGNOSIS — Z3202 Encounter for pregnancy test, result negative: Secondary | ICD-10-CM | POA: Insufficient documentation

## 2012-09-14 DIAGNOSIS — Z8742 Personal history of other diseases of the female genital tract: Secondary | ICD-10-CM | POA: Insufficient documentation

## 2012-09-14 DIAGNOSIS — F3289 Other specified depressive episodes: Secondary | ICD-10-CM | POA: Insufficient documentation

## 2012-09-14 DIAGNOSIS — E1069 Type 1 diabetes mellitus with other specified complication: Secondary | ICD-10-CM | POA: Insufficient documentation

## 2012-09-14 DIAGNOSIS — R112 Nausea with vomiting, unspecified: Secondary | ICD-10-CM | POA: Insufficient documentation

## 2012-09-14 DIAGNOSIS — R197 Diarrhea, unspecified: Secondary | ICD-10-CM | POA: Insufficient documentation

## 2012-09-14 DIAGNOSIS — Z794 Long term (current) use of insulin: Secondary | ICD-10-CM | POA: Insufficient documentation

## 2012-09-14 DIAGNOSIS — Z8679 Personal history of other diseases of the circulatory system: Secondary | ICD-10-CM | POA: Insufficient documentation

## 2012-09-14 DIAGNOSIS — R739 Hyperglycemia, unspecified: Secondary | ICD-10-CM

## 2012-09-14 DIAGNOSIS — F329 Major depressive disorder, single episode, unspecified: Secondary | ICD-10-CM | POA: Insufficient documentation

## 2012-09-14 DIAGNOSIS — Z79899 Other long term (current) drug therapy: Secondary | ICD-10-CM | POA: Insufficient documentation

## 2012-09-14 LAB — URINALYSIS, ROUTINE W REFLEX MICROSCOPIC
Bilirubin Urine: NEGATIVE
Nitrite: NEGATIVE
Specific Gravity, Urine: 1.033 — ABNORMAL HIGH (ref 1.005–1.030)
Urobilinogen, UA: 0.2 mg/dL (ref 0.0–1.0)

## 2012-09-14 LAB — COMPREHENSIVE METABOLIC PANEL
BUN: 8 mg/dL (ref 6–23)
CO2: 22 mEq/L (ref 19–32)
Calcium: 9.2 mg/dL (ref 8.4–10.5)
Creatinine, Ser: 0.67 mg/dL (ref 0.50–1.10)
GFR calc Af Amer: >90 mL/min (ref >90)
GFR calc non Af Amer: >90 mL/min (ref >90)
Glucose, Bld: 616 mg/dL (ref 70–99)

## 2012-09-14 LAB — BLOOD GAS, VENOUS
FIO2: 0.21 %
Patient temperature: 98.6
TCO2: 20.5 mmol/L (ref 0–100)
pH, Ven: 7.379 — ABNORMAL HIGH (ref 7.250–7.300)

## 2012-09-14 LAB — CBC WITH DIFFERENTIAL/PLATELET
Eosinophils Relative: 2 % (ref 0–5)
HCT: 40.2 % (ref 36.0–46.0)
Lymphocytes Relative: 42 % (ref 12–46)
Lymphs Abs: 1.7 10*3/uL (ref 0.7–4.0)
MCH: 28 pg (ref 26.0–34.0)
MCV: 85.4 fL (ref 78.0–100.0)
Monocytes Absolute: 0.4 10*3/uL (ref 0.1–1.0)
RBC: 4.71 MIL/uL (ref 3.87–5.11)
RDW: 12.6 % (ref 11.5–15.5)
WBC: 4 10*3/uL (ref 4.0–10.5)

## 2012-09-14 LAB — POCT PREGNANCY, URINE: Preg Test, Ur: NEGATIVE

## 2012-09-14 MED ORDER — ONDANSETRON HCL 4 MG/2ML IJ SOLN
4.0000 mg | Freq: Once | INTRAMUSCULAR | Status: AC
  Administered 2012-09-14: 4 mg via INTRAVENOUS
  Filled 2012-09-14: qty 2

## 2012-09-14 MED ORDER — MORPHINE SULFATE 4 MG/ML IJ SOLN
6.0000 mg | Freq: Once | INTRAMUSCULAR | Status: AC
  Administered 2012-09-14: 6 mg via INTRAVENOUS
  Filled 2012-09-14 (×2): qty 1

## 2012-09-14 MED ORDER — SODIUM CHLORIDE 0.9 % IV SOLN: 1000.0000 mL | INTRAVENOUS | Status: DC

## 2012-09-14 MED ORDER — INSULIN ASPART 100 UNIT/ML ~~LOC~~ SOLN
8.0000 [IU] | Freq: Once | SUBCUTANEOUS | Status: AC
  Administered 2012-09-14: 8 [IU] via SUBCUTANEOUS
  Filled 2012-09-14: qty 8

## 2012-09-14 MED ORDER — SODIUM CHLORIDE 0.9 % IV SOLN
1000.0000 mL | Freq: Once | INTRAVENOUS | Status: AC
  Administered 2012-09-14: 1000 mL via INTRAVENOUS

## 2012-09-14 MED ORDER — ONDANSETRON 8 MG PO TBDP: 8.0000 mg | ORAL_TABLET | Freq: Three times a day (TID) | ORAL | Status: DC | PRN

## 2012-09-14 MED ORDER — INSULIN REGULAR HUMAN 100 UNIT/ML IJ SOLN: 8.0000 [IU] | Freq: Once | INTRAMUSCULAR | Status: DC

## 2012-09-14 NOTE — ED Provider Notes (Signed)
History     CSN: [id number]  Arrival date & time 09/14/12  0132   First MD Initiated Contact with Patient 09/14/12 0225      Chief Complaint  Patient presents with  . Hyperglycemia    The history is provided by the patient.   patient reports nausea vomiting and diarrhea over the past several days.  She's also noted elevated blood sugars in the 4 and 500s.  She is a type I diabetic on Lantus at night as well as sliding scale NovoLog during the day.  She denies noncompliance with her medications.  She denies dietary indiscretions.  She denies significant abdominal pain at this time.  No chest pain or shortness of breath.  Her symptoms are mild to moderate in severity.  Nothing worsens or improves her symptoms.  Her endocrinologist is at [facility].  She has not spoken with her endocrinologist regarding her elevated blood sugars this week.  Past Medical History  Diagnosis Date  . Ovarian cyst   . Diabetes mellitus   . Depression   . Cluster headache syndrome, intractable     Past Surgical History  Procedure Laterality Date  . Appendectomy    . Cholecystectomy    . Ovarian cyst removal      No family history on file.  History  Substance Use Topics  . Smoking status: Never Smoker   . Smokeless tobacco: Not on file  . Alcohol Use: No    OB History   Grav Para Term Preterm Abortions TAB SAB Ect Mult Living                  Review of Systems  All other systems reviewed and are negative.    Allergies  Sulfa antibiotics; Wellbutrin; Amoxicillin; and Codeine  Home Medications   Current Outpatient Rx  Name  Route  Sig  Dispense  Refill  . ARIPiprazole (ABILIFY) 10 MG tablet   Oral   Take 10 mg by mouth daily.         . clonazePAM (KLONOPIN) 0.5 MG tablet   Oral   Take 0.5 mg by mouth 3 (three) times daily as needed for anxiety. For anxiety         . etonogestrel-ethinyl estradiol (NUVARING) 0.12-0.015 MG/24HR vaginal ring   Vaginal   Place 1 each  vaginally every 28 (twenty-eight) days.         Marland Kitchen ibuprofen (ADVIL,MOTRIN) 200 MG tablet   Oral   Take 400 mg by mouth every 8 (eight) hours as needed. For pain         . insulin aspart (NOVOLOG) 100 UNIT/ML injection   Subcutaneous   Inject 10-20 Units into the skin 3 (three) times daily. PT USES SLIDING SCALE.         . insulin glargine (LANTUS) 100 UNIT/ML injection   Subcutaneous   Inject 36 Units into the skin every morning.          . Multiple Vitamin (MULTIVITAMIN WITH MINERALS) TABS   Oral   Take 1 tablet by mouth daily.         Marland Kitchen zolpidem (AMBIEN) 10 MG tablet   Oral   Take 10 mg by mouth at bedtime as needed for sleep. For sleep         . ondansetron (ZOFRAN ODT) 8 MG disintegrating tablet   Oral   Take 1 tablet (8 mg total) by mouth every 8 (eight) hours as needed for nausea.   10 tablet  0     BP 127/71  Pulse 99  Temp(Src) 99.1 F (37.3 C)  Resp 21  SpO2 96%  Physical Exam  Nursing note and vitals reviewed. Constitutional: She is oriented to person, place, and time. She appears well-developed and well-nourished. No distress.  HENT:  Head: Normocephalic and atraumatic.  Eyes: EOM are normal.  Neck: Normal range of motion.  Cardiovascular: Normal rate, regular rhythm and normal heart sounds.   Pulmonary/Chest: Effort normal and breath sounds normal.  Abdominal: Soft. She exhibits no distension. There is no tenderness.  Musculoskeletal: Normal range of motion.  Neurological: She is alert and oriented to person, place, and time.  Skin: Skin is warm and dry.  Psychiatric: She has a normal mood and affect. Judgment normal.    ED Course  Procedures (including critical care time)  Labs Reviewed  GLUCOSE, CAPILLARY - Abnormal; Notable for the following:    Glucose-Capillary >600 (*)    All other components within normal limits  COMPREHENSIVE METABOLIC PANEL - Abnormal; Notable for the following:    Sodium 129 (*)    Chloride 94 (*)     Glucose, Bld 616 (*)    Albumin 3.4 (*)    Total Bilirubin 0.2 (*)    All other components within normal limits  URINALYSIS, ROUTINE W REFLEX MICROSCOPIC - Abnormal; Notable for the following:    APPearance CLOUDY (*)    Specific Gravity, Urine 1.033 (*)    Glucose, UA >1000 (*)    Hgb urine dipstick TRACE (*)    All other components within normal limits  BLOOD GAS, VENOUS - Abnormal; Notable for the following:    pH, Ven 7.379 (*)    pCO2, Ven 39.5 (*)    All other components within normal limits  GLUCOSE, CAPILLARY - Abnormal; Notable for the following:    Glucose-Capillary 458 (*)    All other components within normal limits  GLUCOSE, CAPILLARY - Abnormal; Notable for the following:    Glucose-Capillary 417 (*)    All other components within normal limits  CBC WITH DIFFERENTIAL  URINE MICROSCOPIC-ADD ON  POCT PREGNANCY, URINE   No results found.   1. Hyperglycemia   2. Nausea vomiting and diarrhea       MDM  5:59 AM The patient is feeling better at this time.  No signs of diabetic ketoacidosis.  Anion gap is 13.  PH is 7.38.  Blood sugar is decreasing.  The patient has been hydrated in the emergency department.        Lyanne Co, MD 09/14/12 [phone]

## 2012-09-14 NOTE — ED Notes (Signed)
Pt states she has been having high blood sugars x 1 week. She has Type I DM . C/ofew episodes of  nausea and vomiting and abdominal discomfort.

## 2012-11-10 ENCOUNTER — Encounter (HOSPITAL_COMMUNITY): Payer: Self-pay | Admitting: Emergency Medicine

## 2012-11-10 ENCOUNTER — Emergency Department (HOSPITAL_COMMUNITY)
Admission: EM | Admit: 2012-11-10 | Discharge: 2012-11-10 | Disposition: A | Discharge status: Home / Self Care | Payer: Self-pay | Attending: Emergency Medicine | Admitting: Emergency Medicine

## 2012-11-10 DIAGNOSIS — Z794 Long term (current) use of insulin: Secondary | ICD-10-CM | POA: Insufficient documentation

## 2012-11-10 DIAGNOSIS — F3289 Other specified depressive episodes: Secondary | ICD-10-CM | POA: Insufficient documentation

## 2012-11-10 DIAGNOSIS — Z8742 Personal history of other diseases of the female genital tract: Secondary | ICD-10-CM | POA: Insufficient documentation

## 2012-11-10 DIAGNOSIS — E119 Type 2 diabetes mellitus without complications: Secondary | ICD-10-CM | POA: Insufficient documentation

## 2012-11-10 DIAGNOSIS — R51 Headache: Secondary | ICD-10-CM | POA: Insufficient documentation

## 2012-11-10 DIAGNOSIS — Z8669 Personal history of other diseases of the nervous system and sense organs: Secondary | ICD-10-CM | POA: Insufficient documentation

## 2012-11-10 DIAGNOSIS — F329 Major depressive disorder, single episode, unspecified: Secondary | ICD-10-CM | POA: Insufficient documentation

## 2012-11-10 MED ORDER — BUTALBITAL-APAP-CAFFEINE 50-325-40 MG PO TABS: 1.0000 | ORAL_TABLET | Freq: Four times a day (QID) | ORAL | Status: DC | PRN

## 2012-11-10 NOTE — ED Notes (Signed)
Pt escorted to discharge window. Verbalized understanding discharge instructions. In no acute distress.   

## 2012-11-10 NOTE — ED Notes (Signed)
MD at bedside. 

## 2012-11-10 NOTE — ED Notes (Addendum)
Pt c/o migraine and R shoulder pain.  Pain score 8/10 and 6/10, respectively.  Sts vomiting x 2 this morning.  Sts she takes multiple migraine meds and ran out of Fioricet.

## 2012-11-10 NOTE — ED Provider Notes (Signed)
History     CSN: [id number]  Arrival date & time 11/10/12  5621   First MD Initiated Contact with Patient 11/10/12 0945      Chief Complaint  Patient presents with  . Migraine    (Consider location/radiation/quality/duration/timing/severity/associated sxs/prior treatment) HPI Comments: [name] is a 37 y.o. Female who states, that she's had a headache for 5 days, and improves when she takes Fioricet, but now she is out. The headache feels like "cluster migraine". She states that she was diagnosed with cluster headache by a neurologist in [place]. In the past. She has used "a below the medicine", but now, all she needs is Fioricet. She denies recent illnesses, including fever, chills, sinus congestion, nasal drainage, sore throat, cough, weakness, and dizziness. There are no other known modifying factors. She states that in the past, she has tried oxygen therapy without relief of her cluster headache.  Patient is a 37 y.o. female presenting with migraines. The history is provided by the patient.  Migraine    Past Medical History  Diagnosis Date  . Ovarian cyst   . Diabetes mellitus   . Depression   . Cluster headache syndrome, intractable     Past Surgical History  Procedure Laterality Date  . Appendectomy    . Cholecystectomy    . Ovarian cyst removal      History reviewed. No pertinent family history.  History  Substance Use Topics  . Smoking status: Never Smoker   . Smokeless tobacco: Not on file  . Alcohol Use: No    OB History   Grav Para Term Preterm Abortions TAB SAB Ect Mult Living                  Review of Systems  All other systems reviewed and are negative.    Allergies  Sulfa antibiotics; Wellbutrin; Amoxicillin; and Codeine  Home Medications   Current Outpatient Rx  Name  Route  Sig  Dispense  Refill  . clonazePAM (KLONOPIN) 0.5 MG tablet   Oral   Take 0.5 mg by mouth 3 (three) times daily as needed for anxiety. For anxiety          . etonogestrel-ethinyl estradiol (NUVARING) 0.12-0.015 MG/24HR vaginal ring   Vaginal   Place 1 each vaginally every 28 (twenty-eight) days.         Marland Kitchen ibuprofen (ADVIL,MOTRIN) 200 MG tablet   Oral   Take 400 mg by mouth every 8 (eight) hours as needed. For pain         . insulin aspart (NOVOLOG) 100 UNIT/ML injection   Subcutaneous   Inject 10-20 Units into the skin 3 (three) times daily. PT USES SLIDING SCALE.         . insulin glargine (LANTUS) 100 UNIT/ML injection   Subcutaneous   Inject 40 Units into the skin every morning.          . Multiple Vitamin (MULTIVITAMIN WITH MINERALS) TABS   Oral   Take 1 tablet by mouth daily.         . ondansetron (ZOFRAN ODT) 8 MG disintegrating tablet   Oral   Take 1 tablet (8 mg total) by mouth every 8 (eight) hours as needed for nausea.   10 tablet   0   . zolpidem (AMBIEN) 10 MG tablet   Oral   Take 10 mg by mouth at bedtime as needed for sleep. For sleep         . butalbital-acetaminophen-caffeine (FIORICET) 50-325-40 MG  per tablet   Oral   Take 1-2 tablets by mouth every 6 (six) hours as needed for headache.   20 tablet   0     BP 146/95  Pulse 99  Temp(Src) 99.1 F (37.3 C) (Oral)  Resp 16  SpO2 100%  Physical Exam  Nursing note and vitals reviewed. Constitutional: She is oriented to person, place, and time. She appears well-developed and well-nourished.  She is wearing sunglasses, in a dark room  HENT:  Head: Normocephalic and atraumatic.  Eyes: Conjunctivae and EOM are normal. Pupils are equal, round, and reactive to light.  Neck: Normal range of motion and phonation normal. Neck supple.  Cardiovascular: Normal rate, regular rhythm and intact distal pulses.   Pulmonary/Chest: Effort normal and breath sounds normal. She exhibits no tenderness.  Abdominal: Soft. She exhibits no distension. There is no tenderness. There is no guarding.  Musculoskeletal: Normal range of motion.  Neurological: She is  alert and oriented to person, place, and time. She has normal strength. She exhibits normal muscle tone.  Romberg is negative. No truncal ataxia.  Skin: Skin is warm and dry.  Psychiatric: Her behavior is normal. Judgment and thought content normal.  Appears depressed    ED Course  Procedures (including critical care time)      1. Headache       MDM  Recurrent headache, nonspecific. Doubt migraine. No clear evidence for cluster headache. Doubt metabolic instability, serious bacterial infection or impending vascular collapse; the patient is stable for discharge.  Nursing Notes Reviewed/ Care Coordinated, and agree without changes. Applicable Imaging Reviewed.  Interpretation of Laboratory Data incorporated into ED treatment   Plan: Home Medications- Fioricet; Home Treatments- rest; Recommended follow up- PCP of choice, when necessary          Flint Melter, MD 11/10/12 1017

## 2012-11-10 NOTE — ED Notes (Signed)
Pt states h/o cluster headaches, started 5 days ago, states took all meds she had and not helping

## 2012-11-22 ENCOUNTER — Encounter (HOSPITAL_COMMUNITY): Payer: Self-pay

## 2012-11-22 ENCOUNTER — Emergency Department (HOSPITAL_COMMUNITY)
Admission: EM | Admit: 2012-11-22 | Discharge: 2012-11-22 | Disposition: A | Discharge status: Home / Self Care | Payer: Self-pay | Attending: Emergency Medicine | Admitting: Emergency Medicine

## 2012-11-22 DIAGNOSIS — E119 Type 2 diabetes mellitus without complications: Secondary | ICD-10-CM | POA: Insufficient documentation

## 2012-11-22 DIAGNOSIS — Z8742 Personal history of other diseases of the female genital tract: Secondary | ICD-10-CM | POA: Insufficient documentation

## 2012-11-22 DIAGNOSIS — Z794 Long term (current) use of insulin: Secondary | ICD-10-CM | POA: Insufficient documentation

## 2012-11-22 DIAGNOSIS — F3289 Other specified depressive episodes: Secondary | ICD-10-CM | POA: Insufficient documentation

## 2012-11-22 DIAGNOSIS — G44029 Chronic cluster headache, not intractable: Secondary | ICD-10-CM | POA: Insufficient documentation

## 2012-11-22 DIAGNOSIS — Z79899 Other long term (current) drug therapy: Secondary | ICD-10-CM | POA: Insufficient documentation

## 2012-11-22 DIAGNOSIS — R51 Headache: Secondary | ICD-10-CM

## 2012-11-22 DIAGNOSIS — F329 Major depressive disorder, single episode, unspecified: Secondary | ICD-10-CM | POA: Insufficient documentation

## 2012-11-22 LAB — GLUCOSE, CAPILLARY: Glucose-Capillary: 102 mg/dL — ABNORMAL HIGH (ref 70–99)

## 2012-11-22 MED ORDER — KETOROLAC TROMETHAMINE 30 MG/ML IJ SOLN
30.0000 mg | Freq: Once | INTRAMUSCULAR | Status: AC
  Administered 2012-11-22: 30 mg via INTRAVENOUS
  Filled 2012-11-22: qty 1

## 2012-11-22 MED ORDER — METOCLOPRAMIDE HCL 5 MG/ML IJ SOLN
10.0000 mg | Freq: Once | INTRAMUSCULAR | Status: AC
  Administered 2012-11-22: 10 mg via INTRAVENOUS
  Filled 2012-11-22: qty 2

## 2012-11-22 MED ORDER — DIPHENHYDRAMINE HCL 50 MG/ML IJ SOLN
25.0000 mg | Freq: Once | INTRAMUSCULAR | Status: AC
  Administered 2012-11-22: 25 mg via INTRAVENOUS
  Filled 2012-11-22: qty 1

## 2012-11-22 MED ORDER — SODIUM CHLORIDE 0.9 % IV BOLUS (SEPSIS)
1000.0000 mL | Freq: Once | INTRAVENOUS | Status: AC
  Administered 2012-11-22: 1000 mL via INTRAVENOUS

## 2012-11-22 MED ORDER — LORAZEPAM 2 MG/ML IJ SOLN
1.0000 mg | Freq: Once | INTRAMUSCULAR | Status: AC
  Administered 2012-11-22: 1 mg via INTRAVENOUS
  Filled 2012-11-22: qty 1

## 2012-11-22 MED ORDER — MORPHINE SULFATE 4 MG/ML IJ SOLN
8.0000 mg | Freq: Once | INTRAMUSCULAR | Status: AC
  Administered 2012-11-22: 4 mg via INTRAVENOUS
  Filled 2012-11-22: qty 2

## 2012-11-22 NOTE — ED Notes (Addendum)
Patient checked her own blood sugar with her monitor and stated that it was 60. 2 orange juices and 2 cran grape juices given with a ham sandwich.Only 4mg  Morphine given Arthor Captain PA aware.

## 2012-11-22 NOTE — ED Notes (Signed)
CBG registered 102 on ED glucometer

## 2012-11-22 NOTE — ED Provider Notes (Signed)
History     CSN: [id number]  Arrival date & time 11/22/12  1351   First MD Initiated Contact with Patient 11/22/12 1445      Chief Complaint  Patient presents with  . Headache    (Consider location/radiation/quality/duration/timing/severity/associated sxs/prior treatment) HPI  [name] is a 37 year old female presents to the emergency department with chief complaint of headache.  Patient has a well-documented history of chronic intractable cluster headaches.  The patient also has a history of insulin-dependent diabetes mellitus.  She states that she has had a headache for the past 3 weeks.  It has been worsening since Saturday.  Unrelieved by her home medication of Fioricet.  She describes the headache as global and throbbing with sharp pain centrally.  Has associated nausea, photophobia and phonophobia but denies any vomiting, symptoms of TIA or stroke.  Her headache is progressive.  No thunderclap onset.  Patient states she has been seen by neurology in [place] where she grew up.  She states that she has tried "every medication for migraine headaches."  Without relief of her chronic symptoms.  She does not have a neurologist here.  Past Medical History  Diagnosis Date  . Ovarian cyst   . Diabetes mellitus   . Depression   . Cluster headache syndrome, intractable     Past Surgical History  Procedure Laterality Date  . Appendectomy    . Cholecystectomy    . Ovarian cyst removal      History reviewed. No pertinent family history.  History  Substance Use Topics  . Smoking status: Never Smoker   . Smokeless tobacco: Not on file  . Alcohol Use: No    OB History   Grav Para Term Preterm Abortions TAB SAB Ect Mult Living                  Review of Systems Ten systems reviewed and are negative for acute change, except as noted in the HPI.   Allergies  Sulfa antibiotics; Wellbutrin; Amoxicillin; and Codeine  Home Medications   Current Outpatient Rx  Name  Route   Sig  Dispense  Refill  . butalbital-acetaminophen-caffeine (FIORICET) 50-325-40 MG per tablet   Oral   Take 1-2 tablets by mouth every 6 (six) hours as needed for headache.   20 tablet   0   . clonazePAM (KLONOPIN) 0.5 MG tablet   Oral   Take 0.5 mg by mouth 3 (three) times daily as needed for anxiety. For anxiety         . etonogestrel-ethinyl estradiol (NUVARING) 0.12-0.015 MG/24HR vaginal ring   Vaginal   Place 1 each vaginally every 28 (twenty-eight) days.         . insulin aspart (NOVOLOG) 100 UNIT/ML injection   Subcutaneous   Inject 10-20 Units into the skin 3 (three) times daily. PT USES SLIDING SCALE.         . insulin glargine (LANTUS) 100 UNIT/ML injection   Subcutaneous   Inject 40 Units into the skin every morning.          . lurasidone (LATUDA) 40 MG TABS   Oral   Take 40 mg by mouth daily. With food         . Multiple Vitamin (MULTIVITAMIN WITH MINERALS) TABS   Oral   Take 1 tablet by mouth daily.         Marland Kitchen zolpidem (AMBIEN) 10 MG tablet   Oral   Take 10 mg by mouth at bedtime as needed for  sleep. For sleep           BP 124/77  Pulse 109  Temp(Src) 99.2 F (37.3 C) (Oral)  Resp 20  SpO2 98%  LMP 11/17/2012  Physical Exam Physical Exam  Nursing note and vitals reviewed. Constitutional: The patient is lying on the bed with a sheet over her head.   HENT:  Head: Normocephalic and atraumatic.  Eyes: Conjunctivae normal and EOM are normal. Pupils are equal, round, and reactive to light. No scleral icterus.  Neck: Normal range of motion.  Cardiovascular: Normal rate, regular rhythm and normal heart sounds.  Exam reveals no gallop and no friction rub.   No murmur heard. Pulmonary/Chest: Effort normal and breath sounds normal. No respiratory distress.  Abdominal: Soft. Bowel sounds are normal. She exhibits no distension and no mass. There is no tenderness. There is no guarding.  Neurological: She is alert and oriented to person, place, and  time.  Speech is clear and goal oriented, follows commands Major Cranial nerves without deficit, no facial droop Normal strength in upper and lower extremities bilaterally including dorsiflexion and plantar flexion, strong and equal grip strength Sensation normal to light and sharp touch Moves extremities without ataxia, coordination intact Normal finger to nose and rapid alternating movements Neg romberg, no pronator drift Skin: Skin is warm and dry. She is not diaphoretic.    ED Course  Procedures (including critical care time)  Labs Reviewed - No data to display No results found.   1. Headache       MDM  3:19 PM Filed Vitals:   11/22/12 1418  BP: 124/77  Pulse: 109  Temp: 99.2 F (37.3 C)  Resp: 20   Patient given migraine cocktail.  CBG pending.  Previous notes state that patient has Phenergan and Tylenol that are the only medications that seemed to work for her headache.  I am resistant to give these medications and will try the standard treatment first.   5:21 PM Patient continues to c/o pain. Will give her morphine.  Recheck shortly.  5:47 PM Patient treated with morphine. She is a asking for foods to eat. Pt HA treated and improved while in ED.  Presentation is like pts typical HA and non concerning for [facility], ICH, Meningitis, or temporal arteritis. Pt is afebrile with no focal neuro deficits, nuchal rigidity, or change in vision. Pt is to follow up with PCP to discuss prophylactic medication. Pt verbalizes understanding and is agreeable with plan to dc.    Arthor Captain, PA-C 11/22/12 1832

## 2012-11-22 NOTE — ED Notes (Signed)
She c/o typical headache pain as one of her "cluster headaches".  She has "pain all over my head; with a sharp pain at right pate area.  She is oriented x 4 with clear speech.  She c/o nausea/denies vomiting.

## 2012-11-22 NOTE — ED Notes (Signed)
Patient is resting comfortably. 

## 2012-11-22 NOTE — ED Notes (Signed)
Patient stated that the Morphine took her pain away

## 2012-11-22 NOTE — ED Notes (Signed)
Patient has had headaches for the past few weeks. It has worsened over the past week. Usually takes fioricet but has not worked this time.

## 2012-11-23 NOTE — ED Provider Notes (Signed)
Medical screening examination/treatment/procedure(s) were performed by non-physician practitioner and as supervising physician I was immediately available for consultation/collaboration.    Vida Roller, MD 11/23/12 [phone]

## 2012-11-26 ENCOUNTER — Emergency Department: Payer: Self-pay | Admitting: Emergency Medicine

## 2012-11-26 LAB — CBC WITH DIFFERENTIAL/PLATELET
Basophil #: 0.1 10*3/uL (ref 0.0–0.1)
Basophil %: 1.2 %
Eosinophil #: 0.1 10*3/uL (ref 0.0–0.7)
Eosinophil %: 1.1 %
HCT: 42.8 % (ref 35.0–47.0)
HGB: 14.6 g/dL (ref 12.0–16.0)
Lymphocyte #: 2.3 10*3/uL (ref 1.0–3.6)
MCH: 29.6 pg (ref 26.0–34.0)
MCHC: 34.1 g/dL (ref 32.0–36.0)
Monocyte #: 0.4 x10 3/mm (ref 0.2–0.9)
Monocyte %: 4.9 %
Neutrophil #: 6.2 10*3/uL (ref 1.4–6.5)
Platelet: 377 10*3/uL (ref 150–440)
RBC: 4.92 10*6/uL (ref 3.80–5.20)
WBC: 9.1 10*3/uL (ref 3.6–11.0)

## 2012-11-26 LAB — URINALYSIS, COMPLETE
Bacteria: NONE SEEN
Bilirubin,UR: NEGATIVE
Blood: NEGATIVE
Glucose,UR: NEGATIVE mg/dL (ref 0–75)
RBC,UR: <1 /HPF (ref 0–5)
Squamous Epithelial: <1
WBC UR: <1 /HPF (ref 0–5)

## 2012-11-26 LAB — COMPREHENSIVE METABOLIC PANEL
Alkaline Phosphatase: 84 U/L (ref 50–136)
Anion Gap: 7 (ref 7–16)
BUN: 9 mg/dL (ref 7–18)
Calcium, Total: 9.7 mg/dL (ref 8.5–10.1)
Co2: 26 mmol/L (ref 21–32)
Osmolality: 279 (ref 275–301)
Potassium: 3.9 mmol/L (ref 3.5–5.1)
SGOT(AST): 11 U/L — ABNORMAL LOW (ref 15–37)
Sodium: 140 mmol/L (ref 136–145)
Total Protein: 7.2 g/dL (ref 6.4–8.2)

## 2012-11-27 ENCOUNTER — Emergency Department (HOSPITAL_COMMUNITY)
Admission: EM | Admit: 2012-11-27 | Discharge: 2012-11-27 | Disposition: A | Discharge status: Home / Self Care | Payer: BC Managed Care – PPO | Attending: Emergency Medicine | Admitting: Emergency Medicine

## 2012-11-27 ENCOUNTER — Emergency Department (HOSPITAL_COMMUNITY): Payer: BC Managed Care – PPO

## 2012-11-27 DIAGNOSIS — Z8669 Personal history of other diseases of the nervous system and sense organs: Secondary | ICD-10-CM | POA: Insufficient documentation

## 2012-11-27 DIAGNOSIS — R51 Headache: Secondary | ICD-10-CM | POA: Insufficient documentation

## 2012-11-27 DIAGNOSIS — H53149 Visual discomfort, unspecified: Secondary | ICD-10-CM | POA: Insufficient documentation

## 2012-11-27 DIAGNOSIS — H538 Other visual disturbances: Secondary | ICD-10-CM | POA: Insufficient documentation

## 2012-11-27 DIAGNOSIS — R5381 Other malaise: Secondary | ICD-10-CM | POA: Insufficient documentation

## 2012-11-27 DIAGNOSIS — Z8742 Personal history of other diseases of the female genital tract: Secondary | ICD-10-CM | POA: Insufficient documentation

## 2012-11-27 DIAGNOSIS — F3289 Other specified depressive episodes: Secondary | ICD-10-CM | POA: Insufficient documentation

## 2012-11-27 DIAGNOSIS — E119 Type 2 diabetes mellitus without complications: Secondary | ICD-10-CM | POA: Insufficient documentation

## 2012-11-27 DIAGNOSIS — Z794 Long term (current) use of insulin: Secondary | ICD-10-CM | POA: Insufficient documentation

## 2012-11-27 DIAGNOSIS — Z79899 Other long term (current) drug therapy: Secondary | ICD-10-CM | POA: Insufficient documentation

## 2012-11-27 DIAGNOSIS — F329 Major depressive disorder, single episode, unspecified: Secondary | ICD-10-CM | POA: Insufficient documentation

## 2012-11-27 DIAGNOSIS — H53143 Visual discomfort, bilateral: Secondary | ICD-10-CM

## 2012-11-27 DIAGNOSIS — R110 Nausea: Secondary | ICD-10-CM | POA: Insufficient documentation

## 2012-11-27 DIAGNOSIS — R5383 Other fatigue: Secondary | ICD-10-CM | POA: Insufficient documentation

## 2012-11-27 MED ORDER — HYDROMORPHONE HCL PF 1 MG/ML IJ SOLN
1.0000 mg | Freq: Once | INTRAMUSCULAR | Status: DC
  Filled 2012-11-27: qty 1

## 2012-11-27 MED ORDER — DIPHENHYDRAMINE HCL 50 MG/ML IJ SOLN
25.0000 mg | Freq: Once | INTRAMUSCULAR | Status: AC
  Administered 2012-11-27: 25 mg via INTRAVENOUS
  Filled 2012-11-27: qty 1

## 2012-11-27 MED ORDER — METOCLOPRAMIDE HCL 5 MG/ML IJ SOLN
10.0000 mg | Freq: Once | INTRAMUSCULAR | Status: AC
Start: 2012-11-27 — End: 2012-11-27
  Administered 2012-11-27: 10 mg via INTRAVENOUS

## 2012-11-27 MED ORDER — HYDROMORPHONE HCL PF 1 MG/ML IJ SOLN
1.0000 mg | Freq: Once | INTRAMUSCULAR | Status: AC
  Administered 2012-11-27: 1 mg via INTRAVENOUS

## 2012-11-27 MED ORDER — BUTALBITAL-APAP-CAFFEINE 50-325-40 MG PO TABS: 1.0000 | ORAL_TABLET | Freq: Four times a day (QID) | ORAL | Status: DC | PRN

## 2012-11-27 MED ORDER — SODIUM CHLORIDE 0.9 % IV BOLUS (SEPSIS)
1000.0000 mL | Freq: Once | INTRAVENOUS | Status: AC
  Administered 2012-11-27: 1000 mL via INTRAVENOUS

## 2012-11-27 MED ORDER — HYDROMORPHONE HCL PF 1 MG/ML IJ SOLN: 1.0000 mg | Freq: Once | INTRAMUSCULAR | Status: DC

## 2012-11-27 MED ORDER — ONDANSETRON 4 MG PO TBDP: 4.0000 mg | ORAL_TABLET | Freq: Three times a day (TID) | ORAL | Status: DC | PRN

## 2012-11-27 MED ORDER — HYDROMORPHONE HCL PF 1 MG/ML IJ SOLN
1.0000 mg | Freq: Once | INTRAMUSCULAR | Status: AC
  Administered 2012-11-27: 1 mg via INTRAVENOUS
  Filled 2012-11-27: qty 1

## 2012-11-27 MED ORDER — METOCLOPRAMIDE HCL 5 MG/ML IJ SOLN
10.0000 mg | Freq: Once | INTRAMUSCULAR | Status: DC
  Filled 2012-11-27: qty 2

## 2012-11-27 MED ORDER — METOCLOPRAMIDE HCL 5 MG/ML IJ SOLN: 10.0000 mg | Freq: Once | INTRAMUSCULAR | Status: DC

## 2012-11-27 NOTE — ED Notes (Signed)
Lab at bedside

## 2012-11-27 NOTE — ED Notes (Signed)
Patient transported to X-ray 

## 2012-11-27 NOTE — ED Notes (Signed)
Patient reports that yesterday afternoon she "felt like something had popped" in her head, followed by headache and dry heaving.

## 2012-11-27 NOTE — ED Notes (Addendum)
Phlebotomy at bedside.

## 2012-11-27 NOTE — ED Notes (Addendum)
Unsuccessful IV attempt x 2.  Asked NS to call IV team.

## 2012-11-27 NOTE — ED Notes (Signed)
Pt escorted to discharge window. Verbalized understanding discharge instructions. In no acute distress.  Vitals reviewed and WDL.  Pt given orange juice and graham crackers, due to "sugar dropping."

## 2012-11-27 NOTE — ED Provider Notes (Signed)
Medical screening examination/treatment/procedure(s) were performed by non-physician practitioner and as supervising physician I was immediately available for consultation/collaboration.    Braylon Lemmons R Dmarion Perfect, MD 11/27/12 1612 

## 2012-11-27 NOTE — ED Provider Notes (Signed)
History     CSN: [id number]  Arrival date & time 11/27/12  0745   First MD Initiated Contact with Patient 11/27/12 [phone]      No chief complaint on file.   (Consider location/radiation/quality/duration/timing/severity/associated sxs/prior treatment) HPI Comments: Pt with PMH significant for DM, depression, and cluster headache syndrome presents to the ED for headache.  States she was driving home from work yesterday when she felt a "pop" in her head with a sudden onset of severe, sharp, generalized headache associated with photophobia, phonophobia, mildly blurred vision, and nausea.  Denies any head trauma or LOC.  States she was unable to drive any further and had to pull over.  Ever since, pt states she feels weak and uncoordinated- states she feels off balance when she tries to walk.  Long hx of cluster headaches- has taken Fioricet which usually helps her sx but did not this time.  Pt was referred to neurology at last visit on 5/24- she attempted to schedule an appt but was told the referral had to come from her PCP.  Denies any chest pain, SOB, abdominal pain, vomiting, or diarrhea.  No fevers, sweats, chills, or neck pain/stiffness.  Pt not on anti-coagulants.  The history is provided by the patient.    Past Medical History  Diagnosis Date  . Ovarian cyst   . Diabetes mellitus   . Depression   . Cluster headache syndrome, intractable     Past Surgical History  Procedure Laterality Date  . Appendectomy    . Cholecystectomy    . Ovarian cyst removal      No family history on file.  History  Substance Use Topics  . Smoking status: Never Smoker   . Smokeless tobacco: Not on file  . Alcohol Use: No    OB History   Grav Para Term Preterm Abortions TAB SAB Ect Mult Living                  Review of Systems  Eyes: Positive for photophobia and visual disturbance.  Gastrointestinal: Positive for nausea.  Neurological: Positive for headaches.  All other systems reviewed  and are negative.    Allergies  Sulfa antibiotics; Wellbutrin; Amoxicillin; and Codeine  Home Medications   Current Outpatient Rx  Name  Route  Sig  Dispense  Refill  . butalbital-acetaminophen-caffeine (FIORICET) 50-325-40 MG per tablet   Oral   Take 1-2 tablets by mouth every 6 (six) hours as needed for headache.   20 tablet   0   . clonazePAM (KLONOPIN) 0.5 MG tablet   Oral   Take 0.5 mg by mouth 3 (three) times daily as needed for anxiety. For anxiety         . etonogestrel-ethinyl estradiol (NUVARING) 0.12-0.015 MG/24HR vaginal ring   Vaginal   Place 1 each vaginally every 28 (twenty-eight) days.         . insulin aspart (NOVOLOG) 100 UNIT/ML injection   Subcutaneous   Inject 10-20 Units into the skin 3 (three) times daily. PT USES SLIDING SCALE.         . insulin glargine (LANTUS) 100 UNIT/ML injection   Subcutaneous   Inject 40 Units into the skin every morning.          . lurasidone (LATUDA) 40 MG TABS   Oral   Take 40 mg by mouth daily. With food         . Multiple Vitamin (MULTIVITAMIN WITH MINERALS) TABS   Oral   Take 1  tablet by mouth daily.         Marland Kitchen zolpidem (AMBIEN) 10 MG tablet   Oral   Take 10 mg by mouth at bedtime as needed for sleep. For sleep           LMP 11/17/2012  Physical Exam  Nursing note and vitals reviewed. Constitutional: She is oriented to person, place, and time. She appears well-developed and well-nourished.  HENT:  Head: Normocephalic and atraumatic.  Mouth/Throat: Oropharynx is clear and moist.  Eyes: Conjunctivae and EOM are normal.  Neck: Normal range of motion. Neck supple. No rigidity.  No meningeal signs  Cardiovascular: Normal rate, regular rhythm and normal heart sounds.   Pulmonary/Chest: Effort normal and breath sounds normal.  Musculoskeletal: Normal range of motion.  Neurological: She is alert and oriented to person, place, and time. She has normal strength. She displays no tremor. No cranial  nerve deficit or sensory deficit. She displays no seizure activity. Coordination and gait normal.  CN grossly intact, moves all extremities appropriately, equal grip strength bilaterally, lifts each leg off bed for 5 seconds each  Skin: Skin is warm and dry.  Psychiatric: She has a normal mood and affect.    ED Course  Procedures (including critical care time)  Labs Reviewed  POCT I-STAT, CHEM 8 - Abnormal; Notable for the following:    Potassium 7.7 (*)    Glucose, Bld 105 (*)    Calcium, Ion 0.98 (*)    Hemoglobin 16.0 (*)    HCT 47.0 (*)    All other components within normal limits  POTASSIUM   Ct Head Wo Contrast  11/27/2012   *RADIOLOGY REPORT*  Clinical Data:  History of headache and photophobia.  CT HEAD WITHOUT CONTRAST  Technique: Contiguous axial images were obtained from the base of the skull through the vertex without contrast  Comparison:  None  Findings:  There is no evidence of brain mass, brain hemorrhage, or acute infarction.  The ventricular system is normal size and shape.  There is no evidence of shift of midline structures, parenchymal lesion, or subdural or epidural hematoma.  The calvarium is intact.  Mastoids are well aerated.  No air fluid level is seen in the paranasal sinuses.  There is minimal maxillary mucosal thickening.  There is a small retention cyst or polyp in the lateral aspect of right maxillary sinus.  IMPRESSION: There is no evidence of brain mass, brain hemorrhage, or acute infarction.  No acute or active process is seen.  No skull lesion is evident.  No air fluid level is seen in the paranasal sinuses.  There is minimal maxillary mucosal thickening.  There is a small retention cyst or polyp in the lateral aspect of right maxillary sinus.   Original Report Authenticated By: Onalee Hua Call     1. Headache   2. Photophobia of both eyes   3. Nausea       MDM   37 y.o. F presenting to the ED for generalized headache assoc with photophobia, phonophobia,  blurred vision and nausea.  Hx of cluster headaches but states this is different from usual headache.  Sudden onset without hx of head trauma or LOC- concern for [facility].    CT head negative for acute findings.  No fever or nuchal rigidity- low suspicion for meningitis.  I-STAT revealing elevated potassium at 7.7, repeat potassium sent which was WNL at 3.5.  Pain relieved with IVF, reglan, dilaudid, and benadryl- pt states she feels better and would like  to go home to rest.  Rx zofran and fioricet.  Follow up with PCP for neuro referral.  Discussed plan with pt, she agreed.  Return precautions advised.      Garlon Hatchet, PA-C 11/27/12 1600

## 2012-12-02 ENCOUNTER — Encounter (HOSPITAL_COMMUNITY): Payer: Self-pay | Admitting: Emergency Medicine

## 2012-12-02 ENCOUNTER — Emergency Department (HOSPITAL_COMMUNITY)
Admission: EM | Admit: 2012-12-02 | Discharge: 2012-12-02 | Disposition: A | Discharge status: Home / Self Care | Payer: BC Managed Care – PPO | Attending: Emergency Medicine | Admitting: Emergency Medicine

## 2012-12-02 DIAGNOSIS — F329 Major depressive disorder, single episode, unspecified: Secondary | ICD-10-CM | POA: Insufficient documentation

## 2012-12-02 DIAGNOSIS — Z79899 Other long term (current) drug therapy: Secondary | ICD-10-CM | POA: Insufficient documentation

## 2012-12-02 DIAGNOSIS — H53149 Visual discomfort, unspecified: Secondary | ICD-10-CM | POA: Insufficient documentation

## 2012-12-02 DIAGNOSIS — R51 Headache: Secondary | ICD-10-CM | POA: Insufficient documentation

## 2012-12-02 DIAGNOSIS — Z8679 Personal history of other diseases of the circulatory system: Secondary | ICD-10-CM | POA: Insufficient documentation

## 2012-12-02 DIAGNOSIS — Z8742 Personal history of other diseases of the female genital tract: Secondary | ICD-10-CM | POA: Insufficient documentation

## 2012-12-02 DIAGNOSIS — Z794 Long term (current) use of insulin: Secondary | ICD-10-CM | POA: Insufficient documentation

## 2012-12-02 DIAGNOSIS — G44009 Cluster headache syndrome, unspecified, not intractable: Secondary | ICD-10-CM | POA: Insufficient documentation

## 2012-12-02 DIAGNOSIS — IMO0002 Reserved for concepts with insufficient information to code with codable children: Secondary | ICD-10-CM | POA: Insufficient documentation

## 2012-12-02 DIAGNOSIS — E119 Type 2 diabetes mellitus without complications: Secondary | ICD-10-CM | POA: Insufficient documentation

## 2012-12-02 DIAGNOSIS — F3289 Other specified depressive episodes: Secondary | ICD-10-CM | POA: Insufficient documentation

## 2012-12-02 DIAGNOSIS — R110 Nausea: Secondary | ICD-10-CM | POA: Insufficient documentation

## 2012-12-02 LAB — POCT I-STAT, CHEM 8
Calcium, Ion: 0.98 mmol/L — ABNORMAL LOW (ref 1.12–1.23)
Glucose, Bld: 105 mg/dL — ABNORMAL HIGH (ref 70–99)
HCT: 47 % — ABNORMAL HIGH (ref 36.0–46.0)
Hemoglobin: 16 g/dL — ABNORMAL HIGH (ref 12.0–15.0)

## 2012-12-02 MED ORDER — ONDANSETRON HCL 4 MG/2ML IJ SOLN
4.0000 mg | Freq: Once | INTRAMUSCULAR | Status: AC
  Administered 2012-12-02: 4 mg via INTRAVENOUS
  Filled 2012-12-02: qty 2

## 2012-12-02 MED ORDER — SODIUM CHLORIDE 0.9 % IV BOLUS (SEPSIS)
1000.0000 mL | Freq: Once | INTRAVENOUS | Status: AC
  Administered 2012-12-02: 1000 mL via INTRAVENOUS

## 2012-12-02 MED ORDER — METOCLOPRAMIDE HCL 5 MG/ML IJ SOLN
10.0000 mg | Freq: Once | INTRAMUSCULAR | Status: AC
  Administered 2012-12-02: 10 mg via INTRAVENOUS
  Filled 2012-12-02: qty 2

## 2012-12-02 MED ORDER — DIPHENHYDRAMINE HCL 50 MG/ML IJ SOLN
25.0000 mg | Freq: Once | INTRAMUSCULAR | Status: AC
  Administered 2012-12-02: 25 mg via INTRAVENOUS
  Filled 2012-12-02: qty 1

## 2012-12-02 MED ORDER — HYDROMORPHONE HCL PF 1 MG/ML IJ SOLN
1.0000 mg | Freq: Once | INTRAMUSCULAR | Status: AC
  Administered 2012-12-02: 1 mg via INTRAVENOUS
  Filled 2012-12-02: qty 1

## 2012-12-02 MED ORDER — KETOROLAC TROMETHAMINE 30 MG/ML IJ SOLN
30.0000 mg | Freq: Once | INTRAMUSCULAR | Status: AC
  Administered 2012-12-02: 30 mg via INTRAVENOUS
  Filled 2012-12-02: qty 1

## 2012-12-02 NOTE — ED Notes (Signed)
Per EMS-pt c/o of headache x2 days. Sensitivity to light, nausea no vomiting. Pain 10/10

## 2012-12-02 NOTE — ED Notes (Signed)
[id number]<HK> Expected date:<BR> Expected time:<BR> Means of arrival:<BR> Comments:<BR> ems- cbg [id number]

## 2012-12-02 NOTE — ED Provider Notes (Signed)
History     CSN: [id number]  Arrival date & time 12/02/12  4696   First MD Initiated Contact with Patient 12/02/12 1826      Chief Complaint  Patient presents with  . Headache    (Consider location/radiation/quality/duration/timing/severity/associated sxs/prior treatment) HPI.... Sharp migraine headache for the past 2 days with associated photophobia, nausea.  No neuro deficits, stiff neck.  Patient has chronic migraine headaches. She takes Fioricet for same. CT scan of head 11/27/2012 was normal.  Severity is moderate.  Past Medical History  Diagnosis Date  . Ovarian cyst   . Diabetes mellitus   . Depression   . Cluster headache syndrome, intractable     Past Surgical History  Procedure Laterality Date  . Appendectomy    . Cholecystectomy    . Ovarian cyst removal      No family history on file.  History  Substance Use Topics  . Smoking status: Never Smoker   . Smokeless tobacco: Not on file  . Alcohol Use: No    OB History   Grav Para Term Preterm Abortions TAB SAB Ect Mult Living                  Review of Systems  All other systems reviewed and are negative.    Allergies  Sulfa antibiotics; Wellbutrin; Amoxicillin; and Codeine  Home Medications   Current Outpatient Rx  Name  Route  Sig  Dispense  Refill  . butalbital-acetaminophen-caffeine (FIORICET WITH CODEINE) 50-325-40-30 MG per capsule   Oral   Take 1 capsule by mouth every 4 (four) hours as needed for headache.         . clonazePAM (KLONOPIN) 0.5 MG tablet   Oral   Take 0.5 mg by mouth 3 (three) times daily as needed for anxiety. For anxiety         . etonogestrel-ethinyl estradiol (NUVARING) 0.12-0.015 MG/24HR vaginal ring   Vaginal   Place 1 each vaginally every 28 (twenty-eight) days.         . insulin aspart (NOVOLOG) 100 UNIT/ML injection   Subcutaneous   Inject 10-20 Units into the skin 3 (three) times daily. PT USES SLIDING SCALE.         . insulin glargine (LANTUS)  100 UNIT/ML injection   Subcutaneous   Inject 40 Units into the skin every morning.          . lurasidone (LATUDA) 40 MG TABS   Oral   Take 40 mg by mouth daily. With food         . Multiple Vitamin (MULTIVITAMIN WITH MINERALS) TABS   Oral   Take 1 tablet by mouth daily.         . predniSONE (DELTASONE) 10 MG tablet   Oral   Take 10 mg by mouth daily.         . promethazine (PHENERGAN) 25 MG tablet   Oral   Take 25 mg by mouth every 6 (six) hours as needed for nausea.         Marland Kitchen zolpidem (AMBIEN) 10 MG tablet   Oral   Take 10 mg by mouth at bedtime as needed for sleep. For sleep           BP 137/82  Pulse 92  Temp(Src) 98.7 F (37.1 C) (Oral)  Resp 20  SpO2 100%  LMP 11/22/2012  Physical Exam  Nursing note and vitals reviewed. Constitutional: She is oriented to person, place, and time. She appears well-developed and well-nourished.  HENT:  Head: Normocephalic and atraumatic.  Eyes: Conjunctivae and EOM are normal. Pupils are equal, round, and reactive to light.  Neck: Normal range of motion. Neck supple.  No stiff neck  Cardiovascular: Normal rate, regular rhythm and normal heart sounds.   Pulmonary/Chest: Effort normal and breath sounds normal.  Abdominal: Soft. Bowel sounds are normal.  Musculoskeletal: Normal range of motion.  Neurological: She is alert and oriented to person, place, and time.  Slight photophobia  Skin: Skin is warm and dry.  Psychiatric: She has a normal mood and affect.    ED Course  Procedures (including critical care time)  Labs Reviewed - No data to display No results found.   1. Headache       MDM  Patient feels better after IV fluids and pain management.  This appears to be her normal migraine.    Patient has primary care followup        Donnetta Hutching, MD 12/02/12 2336

## 2012-12-05 ENCOUNTER — Ambulatory Visit: Payer: Self-pay | Admitting: *Deleted

## 2012-12-05 ENCOUNTER — Encounter (HOSPITAL_COMMUNITY): Payer: Self-pay | Admitting: Cardiology

## 2012-12-05 ENCOUNTER — Inpatient Hospital Stay (HOSPITAL_COMMUNITY)
Admission: EM | Admit: 2012-12-05 | Discharge: 2012-12-08 | DRG: 025 | Disposition: A | Discharge status: Home / Self Care | Payer: BC Managed Care – PPO | Attending: Internal Medicine | Admitting: Internal Medicine

## 2012-12-05 ENCOUNTER — Ambulatory Visit (INDEPENDENT_AMBULATORY_CARE_PROVIDER_SITE_OTHER): Payer: BC Managed Care – PPO | Admitting: Neurology

## 2012-12-05 ENCOUNTER — Encounter: Payer: Self-pay | Admitting: Neurology

## 2012-12-05 ENCOUNTER — Emergency Department (HOSPITAL_COMMUNITY): Payer: BC Managed Care – PPO

## 2012-12-05 VITALS — BP 125/80 | HR 100 | Temp 99.0°F | Ht 61.5 in | Wt 169.0 lb

## 2012-12-05 DIAGNOSIS — G43019 Migraine without aura, intractable, without status migrainosus: Secondary | ICD-10-CM | POA: Diagnosis present

## 2012-12-05 DIAGNOSIS — F319 Bipolar disorder, unspecified: Secondary | ICD-10-CM | POA: Diagnosis present

## 2012-12-05 DIAGNOSIS — G43909 Migraine, unspecified, not intractable, without status migrainosus: Secondary | ICD-10-CM

## 2012-12-05 DIAGNOSIS — E119 Type 2 diabetes mellitus without complications: Secondary | ICD-10-CM

## 2012-12-05 DIAGNOSIS — G43919 Migraine, unspecified, intractable, without status migrainosus: Principal | ICD-10-CM | POA: Diagnosis present

## 2012-12-05 DIAGNOSIS — R110 Nausea: Secondary | ICD-10-CM | POA: Diagnosis present

## 2012-12-05 DIAGNOSIS — Z833 Family history of diabetes mellitus: Secondary | ICD-10-CM

## 2012-12-05 DIAGNOSIS — F411 Generalized anxiety disorder: Secondary | ICD-10-CM | POA: Diagnosis present

## 2012-12-05 DIAGNOSIS — E109 Type 1 diabetes mellitus without complications: Secondary | ICD-10-CM | POA: Diagnosis present

## 2012-12-05 DIAGNOSIS — Z794 Long term (current) use of insulin: Secondary | ICD-10-CM

## 2012-12-05 HISTORY — DX: Migraine without aura, intractable, without status migrainosus: G43.019

## 2012-12-05 HISTORY — DX: Type 1 diabetes mellitus without complications: E10.9

## 2012-12-05 LAB — CBC WITH DIFFERENTIAL/PLATELET
Basophils Absolute: 0 10*3/uL (ref 0.0–0.1)
Basophils Relative: 0 % (ref 0–1)
Eosinophils Absolute: 0.2 10*3/uL (ref 0.0–0.7)
Eosinophils Relative: 2 % (ref 0–5)
HCT: 43.9 % (ref 36.0–46.0)
Hemoglobin: 15.2 g/dL — ABNORMAL HIGH (ref 12.0–15.0)
MCH: 29.9 pg (ref 26.0–34.0)
MCHC: 34.6 g/dL (ref 30.0–36.0)
MCV: 86.4 fL (ref 78.0–100.0)
Monocytes Absolute: 0.4 10*3/uL (ref 0.1–1.0)
Monocytes Relative: 4 % (ref 3–12)
RDW: 12.7 % (ref 11.5–15.5)

## 2012-12-05 LAB — URINALYSIS, ROUTINE W REFLEX MICROSCOPIC
Bilirubin Urine: NEGATIVE
Hgb urine dipstick: NEGATIVE
Ketones, ur: 15 mg/dL — AB
Nitrite: NEGATIVE
Specific Gravity, Urine: 1.031 — ABNORMAL HIGH (ref 1.005–1.030)
Urobilinogen, UA: 0.2 mg/dL (ref 0.0–1.0)
pH: 6 (ref 5.0–8.0)

## 2012-12-05 LAB — BASIC METABOLIC PANEL
BUN: 13 mg/dL (ref 6–23)
Calcium: 9.4 mg/dL (ref 8.4–10.5)
Creatinine, Ser: 0.61 mg/dL (ref 0.50–1.10)
GFR calc Af Amer: >90 mL/min (ref >90)

## 2012-12-05 LAB — URINE MICROSCOPIC-ADD ON

## 2012-12-05 LAB — PREGNANCY, URINE: Preg Test, Ur: NEGATIVE

## 2012-12-05 MED ORDER — PREDNISONE 10 MG PO TABS
10.0000 mg | ORAL_TABLET | Freq: Every day | ORAL | Status: DC
  Administered 2012-12-06 – 2012-12-08 (×3): 10 mg via ORAL
  Filled 2012-12-05 (×5): qty 1

## 2012-12-05 MED ORDER — ACETAMINOPHEN 650 MG RE SUPP: 650.0000 mg | Freq: Four times a day (QID) | RECTAL | Status: DC | PRN

## 2012-12-05 MED ORDER — SENNOSIDES-DOCUSATE SODIUM 8.6-50 MG PO TABS
1.0000 | ORAL_TABLET | Freq: Every evening | ORAL | Status: DC | PRN
  Administered 2012-12-05 – 2012-12-06 (×2): 1 via ORAL
  Filled 2012-12-05 (×2): qty 1

## 2012-12-05 MED ORDER — ZOLPIDEM TARTRATE 5 MG PO TABS: 10.0000 mg | ORAL_TABLET | Freq: Every evening | ORAL | Status: DC | PRN

## 2012-12-05 MED ORDER — PROMETHAZINE HCL 25 MG PO TABS: 25.0000 mg | ORAL_TABLET | Freq: Four times a day (QID) | ORAL | Status: DC | PRN

## 2012-12-05 MED ORDER — ENOXAPARIN SODIUM 40 MG/0.4ML ~~LOC~~ SOLN
40.0000 mg | Freq: Every day | SUBCUTANEOUS | Status: DC
  Administered 2012-12-05 – 2012-12-07 (×3): 40 mg via SUBCUTANEOUS
  Filled 2012-12-05 (×4): qty 0.4

## 2012-12-05 MED ORDER — BUTALBITAL-APAP-CAFFEINE 50-325-40 MG PO TABS
1.0000 | ORAL_TABLET | ORAL | Status: DC | PRN
  Administered 2012-12-05: 1 via ORAL
  Filled 2012-12-05: qty 1

## 2012-12-05 MED ORDER — SODIUM CHLORIDE 0.9 % IV SOLN: 250.0000 mL | INTRAVENOUS | Status: DC | PRN

## 2012-12-05 MED ORDER — DIHYDROERGOTAMINE MESYLATE 1 MG/ML IJ SOLN
0.5000 mg | Freq: Three times a day (TID) | INTRAMUSCULAR | Status: DC
  Administered 2012-12-06 – 2012-12-08 (×8): 0.5 mg via INTRAVENOUS
  Filled 2012-12-05 (×12): qty 0.5

## 2012-12-05 MED ORDER — SODIUM CHLORIDE 0.9 % IV SOLN: Freq: Once | INTRAVENOUS | Status: DC

## 2012-12-05 MED ORDER — GADOBENATE DIMEGLUMINE 529 MG/ML IV SOLN
15.0000 mL | Freq: Once | INTRAVENOUS | Status: AC
  Administered 2012-12-05: 15 mL via INTRAVENOUS

## 2012-12-05 MED ORDER — SODIUM CHLORIDE 0.9 % IJ SOLN
3.0000 mL | Freq: Two times a day (BID) | INTRAMUSCULAR | Status: DC
  Administered 2012-12-05: 3 mL via INTRAVENOUS

## 2012-12-05 MED ORDER — INSULIN GLARGINE 100 UNIT/ML ~~LOC~~ SOLN
40.0000 [IU] | Freq: Every morning | SUBCUTANEOUS | Status: DC
  Administered 2012-12-06 – 2012-12-08 (×3): 40 [IU] via SUBCUTANEOUS
  Filled 2012-12-05 (×3): qty 0.4

## 2012-12-05 MED ORDER — ACETAMINOPHEN 325 MG PO TABS: 650.0000 mg | ORAL_TABLET | Freq: Four times a day (QID) | ORAL | Status: DC | PRN

## 2012-12-05 MED ORDER — ONDANSETRON HCL 4 MG/2ML IJ SOLN: 4.0000 mg | Freq: Three times a day (TID) | INTRAMUSCULAR | Status: DC | PRN

## 2012-12-05 MED ORDER — SODIUM CHLORIDE 0.9 % IV SOLN
INTRAVENOUS | Status: AC
  Administered 2012-12-05: 23:00:00 via INTRAVENOUS

## 2012-12-05 MED ORDER — METOCLOPRAMIDE HCL 5 MG/ML IJ SOLN
10.0000 mg | Freq: Three times a day (TID) | INTRAMUSCULAR | Status: DC
  Administered 2012-12-06 – 2012-12-08 (×8): 10 mg via INTRAVENOUS
  Filled 2012-12-05 (×12): qty 2

## 2012-12-05 MED ORDER — ADULT MULTIVITAMIN W/MINERALS CH
1.0000 | ORAL_TABLET | Freq: Every day | ORAL | Status: DC
  Administered 2012-12-06 – 2012-12-08 (×3): 1 via ORAL
  Filled 2012-12-05 (×3): qty 1

## 2012-12-05 MED ORDER — KETOROLAC TROMETHAMINE 60 MG/2ML IM SOLN
60.0000 mg | Freq: Once | INTRAMUSCULAR | Status: DC
  Filled 2012-12-05: qty 2

## 2012-12-05 MED ORDER — VALPROATE SODIUM 500 MG/5ML IV SOLN
1500.0000 mg | INTRAVENOUS | Status: DC
  Administered 2012-12-05: 1500 mg via INTRAVENOUS

## 2012-12-05 MED ORDER — KETOROLAC TROMETHAMINE 30 MG/ML IJ SOLN
INTRAMUSCULAR | Status: AC
  Filled 2012-12-05: qty 1

## 2012-12-05 MED ORDER — ONDANSETRON HCL 4 MG PO TABS
4.0000 mg | ORAL_TABLET | Freq: Four times a day (QID) | ORAL | Status: DC | PRN
  Administered 2012-12-08: 4 mg via ORAL
  Filled 2012-12-05: qty 1

## 2012-12-05 MED ORDER — BUTALBITAL-APAP-CAFF-COD 50-325-40-30 MG PO CAPS: 1.0000 | ORAL_CAPSULE | ORAL | Status: DC | PRN

## 2012-12-05 MED ORDER — ONDANSETRON HCL 4 MG/2ML IJ SOLN
4.0000 mg | Freq: Four times a day (QID) | INTRAMUSCULAR | Status: DC | PRN
  Administered 2012-12-06 – 2012-12-08 (×5): 4 mg via INTRAVENOUS
  Filled 2012-12-05 (×5): qty 2

## 2012-12-05 MED ORDER — HYDROMORPHONE HCL PF 1 MG/ML IJ SOLN
1.0000 mg | Freq: Once | INTRAMUSCULAR | Status: AC
  Administered 2012-12-05: 1 mg via INTRAVENOUS
  Filled 2012-12-05: qty 1

## 2012-12-05 MED ORDER — LURASIDONE HCL 40 MG PO TABS
40.0000 mg | ORAL_TABLET | Freq: Every day | ORAL | Status: DC
  Administered 2012-12-06 – 2012-12-08 (×3): 40 mg via ORAL
  Filled 2012-12-05 (×5): qty 1

## 2012-12-05 MED ORDER — KETOROLAC TROMETHAMINE 30 MG/ML IJ SOLN
30.0000 mg | Freq: Once | INTRAMUSCULAR | Status: AC
  Administered 2012-12-05: 30 mg via INTRAVENOUS

## 2012-12-05 MED ORDER — SODIUM CHLORIDE 0.9 % IV BOLUS (SEPSIS)
1000.0000 mL | Freq: Once | INTRAVENOUS | Status: AC
  Administered 2012-12-05: 1000 mL via INTRAVENOUS

## 2012-12-05 MED ORDER — DIPHENHYDRAMINE HCL 50 MG/ML IJ SOLN
25.0000 mg | Freq: Once | INTRAMUSCULAR | Status: AC
  Administered 2012-12-05: 25 mg via INTRAVENOUS
  Filled 2012-12-05: qty 1

## 2012-12-05 MED ORDER — CLONAZEPAM 0.5 MG PO TABS
0.5000 mg | ORAL_TABLET | Freq: Three times a day (TID) | ORAL | Status: DC | PRN
  Administered 2012-12-05 – 2012-12-07 (×2): 0.5 mg via ORAL
  Filled 2012-12-05 (×2): qty 1

## 2012-12-05 MED ORDER — INSULIN ASPART 100 UNIT/ML ~~LOC~~ SOLN
0.0000 [IU] | Freq: Three times a day (TID) | SUBCUTANEOUS | Status: DC
  Administered 2012-12-06: 5 [IU] via SUBCUTANEOUS
  Administered 2012-12-06: 3 [IU] via SUBCUTANEOUS
  Administered 2012-12-06: 2 [IU] via SUBCUTANEOUS
  Administered 2012-12-07: 5 [IU] via SUBCUTANEOUS
  Administered 2012-12-07: 3 [IU] via SUBCUTANEOUS

## 2012-12-05 MED ORDER — ZOLPIDEM TARTRATE 5 MG PO TABS
5.0000 mg | ORAL_TABLET | Freq: Every evening | ORAL | Status: DC | PRN
  Administered 2012-12-05 – 2012-12-07 (×3): 5 mg via ORAL
  Filled 2012-12-05 (×2): qty 1
  Filled 2012-12-05: qty 2

## 2012-12-05 MED ORDER — METOCLOPRAMIDE HCL 5 MG/ML IJ SOLN
10.0000 mg | Freq: Once | INTRAMUSCULAR | Status: AC
  Administered 2012-12-05: 10 mg via INTRAVENOUS
  Filled 2012-12-05: qty 2

## 2012-12-05 MED ORDER — SODIUM CHLORIDE 0.9 % IJ SOLN: 3.0000 mL | INTRAMUSCULAR | Status: DC | PRN

## 2012-12-05 MED ORDER — PROPRANOLOL HCL 10 MG PO TABS: ORAL_TABLET | ORAL | Status: DC

## 2012-12-05 MED ORDER — LURASIDONE HCL 40 MG PO TABS: 40.0000 mg | ORAL_TABLET | Freq: Every day | ORAL | Status: DC

## 2012-12-05 MED ORDER — INSULIN ASPART 100 UNIT/ML ~~LOC~~ SOLN
4.0000 [IU] | Freq: Three times a day (TID) | SUBCUTANEOUS | Status: DC
  Administered 2012-12-06 – 2012-12-08 (×7): 4 [IU] via SUBCUTANEOUS

## 2012-12-05 NOTE — ED Provider Notes (Signed)
History     CSN: [id number]  Arrival date & time 12/05/12  1359   First MD Initiated Contact with Patient 12/05/12 1420      Chief Complaint  Patient presents with  . Migraine    (Consider location/radiation/quality/duration/timing/severity/associated sxs/prior treatment) HPI Pt is a 37yo female with hx of migraines sent over by neurologist Dr. Anne Hahn for intractable migraine.  Pt reports typical migraine for past 3 weeks, current episode started Wednesday 6/4.  Pain is located on right, parietal region and top of head, aching and stabbing, 10/10 associated with blurred vision, nausea, and vomiting.  Worsened by light.  Pt was given Depacon in Dr. Clarisa Kindred office w/o relief.  Pt was sent for MRI and admittance to hospital for tx with D.H.E 45.  Pt reports having been tx with same 35yrs ago, admitted in [place] for 7 days.    Past Medical History  Diagnosis Date  . Ovarian cyst   . Diabetes mellitus   . Depression   . Cluster headache syndrome, intractable   . Migraine without aura, with intractable migraine, so stated, without mention of status migrainosus 12/05/2012    Past Surgical History  Procedure Laterality Date  . Appendectomy    . Cholecystectomy    . Ovarian cyst removal    . Bunionectomy Bilateral     Family History  Problem Relation Age of Onset  . Adopted: Yes    History  Substance Use Topics  . Smoking status: Never Smoker   . Smokeless tobacco: Never Used  . Alcohol Use: Yes     Comment: monthly    OB History   Grav Para Term Preterm Abortions TAB SAB Ect Mult Living                  Review of Systems  Constitutional: Positive for chills and appetite change ( decreased). Negative for fever.  HENT: Positive for neck pain.   Eyes: Positive for photophobia.  Respiratory: Negative for shortness of breath.   Cardiovascular: Negative for chest pain.  Gastrointestinal: Positive for nausea and vomiting. Negative for diarrhea and constipation.   Neurological: Positive for headaches. Negative for seizures, syncope, light-headedness and numbness.  All other systems reviewed and are negative.    Allergies  Sulfa antibiotics; Wellbutrin; Amoxicillin; and Codeine  Home Medications   Current Outpatient Rx  Name  Route  Sig  Dispense  Refill  . butalbital-acetaminophen-caffeine (FIORICET WITH CODEINE) 50-325-40-30 MG per capsule   Oral   Take 1 capsule by mouth every 4 (four) hours as needed for headache.         . clonazePAM (KLONOPIN) 0.5 MG tablet   Oral   Take 0.5 mg by mouth 3 (three) times daily as needed for anxiety. For anxiety         . insulin aspart (NOVOLOG) 100 UNIT/ML injection   Subcutaneous   Inject 10-20 Units into the skin 3 (three) times daily. PT USES SLIDING SCALE.         . insulin glargine (LANTUS) 100 UNIT/ML injection   Subcutaneous   Inject 40 Units into the skin every morning.          . lurasidone (LATUDA) 40 MG TABS   Oral   Take 40 mg by mouth daily. With food         . Multiple Vitamin (MULTIVITAMIN WITH MINERALS) TABS   Oral   Take 1 tablet by mouth daily.         . predniSONE (DELTASONE)  10 MG tablet   Oral   Take 10 mg by mouth daily.         . promethazine (PHENERGAN) 25 MG tablet   Oral   Take 25 mg by mouth every 6 (six) hours as needed for nausea.         . propranolol (INDERAL) 10 MG tablet      One tablet twice a day for one week, then take two tablets twice a day for one week, then take 3 tablets twice a day   180 tablet   2   . zolpidem (AMBIEN) 10 MG tablet   Oral   Take 10 mg by mouth at bedtime as needed for sleep. For sleep         . etonogestrel-ethinyl estradiol (NUVARING) 0.12-0.015 MG/24HR vaginal ring   Vaginal   Place 1 each vaginally every 28 (twenty-eight) days.           BP 138/77  Pulse 95  Temp(Src) 97.8 F (36.6 C) (Oral)  Resp 18  SpO2 97%  LMP 11/22/2012  Physical Exam  Nursing note and vitals  reviewed. Constitutional: She is oriented to person, place, and time. She appears well-developed and well-nourished. No distress.  Pt sitting in exam room, lights off, sunglasses on.   Eyes: Conjunctivae and EOM are normal. Pupils are equal, round, and reactive to light. Right eye exhibits no discharge. Left eye exhibits no discharge. No scleral icterus.  Neck: Normal range of motion. Neck supple.  No nuchal rigidity or meningeal signs   Cardiovascular: Normal rate, regular rhythm and normal heart sounds.   Pulmonary/Chest: Effort normal and breath sounds normal. No respiratory distress. She has no wheezes. She has no rales. She exhibits no tenderness.  Musculoskeletal: Normal range of motion.  Neurological: She is alert and oriented to person, place, and time. She has normal strength. No cranial nerve deficit or sensory deficit. She displays a negative Romberg sign. Coordination and gait normal. GCS eye subscore is 4. GCS verbal subscore is 5. GCS motor subscore is 6.  Skin: Skin is warm and dry. She is not diaphoretic.  Psychiatric: She has a normal mood and affect. Her behavior is normal.    ED Course  Procedures (including critical care time)  Labs Reviewed  CBC WITH DIFFERENTIAL  BASIC METABOLIC PANEL  PREGNANCY, URINE  URINALYSIS, ROUTINE W REFLEX MICROSCOPIC   No results found.   1. Migraine without aura, with intractable migraine, so stated, without mention of status migrainosus       MDM  Will start pt on "migraine cocktail": reglan, toradol, benadryl, dilaudid, and fluids.  Sending pt for MR of head and brain.  Will obtain basic labs for pt admit.     Signed pt out to Dr. Rhunette Croft who will consult hospitalist for pt placement, likely obs unit for pain control.           Junius Finner, PA-C 12/05/12 1749

## 2012-12-05 NOTE — Patient Instructions (Signed)
Pt to ER for additional treatment.

## 2012-12-05 NOTE — H&P (Signed)
[facility]          History and Physical    PCP:   Beverley Fiedler, MD   Chief Complaint:  Migraine  HPI: 37 y/o woman with PMH significant for severe migraines and IDDM, went to see her neurologist today, Dr. Anne Hahn for an acute migraine. These have been chronic for her. She was given depocon IV, however, this failed to resolve her HA. Dr. Anne Hahn sent her to the hospital for a DHE 45 protocol. ED has spoken with neurology, who will write orders for this protocol. We have been asked to admit her.  Allergies:   Allergies  Allergen Reactions  . Sulfa Antibiotics Nausea And Vomiting  . Wellbutrin (Bupropion) Other (See Comments)    "Makes me suicidal."  . Amoxicillin Rash  . Codeine Rash      Past Medical History  Diagnosis Date  . Ovarian cyst   . Diabetes mellitus   . Depression   . Cluster headache syndrome, intractable   . Migraine without aura, with intractable migraine, so stated, without mention of status migrainosus 12/05/2012    Past Surgical History  Procedure Laterality Date  . Appendectomy    . Cholecystectomy    . Ovarian cyst removal    . Bunionectomy Bilateral     Prior to Admission medications   Medication Sig Start Date End Date Taking? Authorizing Provider  butalbital-acetaminophen-caffeine (FIORICET WITH CODEINE) 50-325-40-30 MG per capsule Take 1 capsule by mouth every 4 (four) hours as needed for headache.   Yes Historical Provider, MD  clonazePAM (KLONOPIN) 0.5 MG tablet Take 0.5 mg by mouth 3 (three) times daily as needed for anxiety. For anxiety   Yes Historical Provider, MD  insulin aspart (NOVOLOG) 100 UNIT/ML injection Inject 10-20 Units into the skin 3 (three) times daily. PT USES SLIDING SCALE.   Yes Historical Provider, MD  insulin glargine (LANTUS) 100 UNIT/ML injection Inject 40 Units into the skin every morning.    Yes Historical Provider, MD  lurasidone (LATUDA) 40 MG TABS Take 40 mg by mouth daily. With food   Yes  Historical Provider, MD  Multiple Vitamin (MULTIVITAMIN WITH MINERALS) TABS Take 1 tablet by mouth daily.   Yes Historical Provider, MD  predniSONE (DELTASONE) 10 MG tablet Take 10 mg by mouth daily.   Yes Historical Provider, MD  promethazine (PHENERGAN) 25 MG tablet Take 25 mg by mouth every 6 (six) hours as needed for nausea.   Yes Historical Provider, MD  propranolol (INDERAL) 10 MG tablet One tablet twice a day for one week, then take two tablets twice a day for one week, then take 3 tablets twice a day 12/05/12  Yes York Spaniel, MD  zolpidem (AMBIEN) 10 MG tablet Take 10 mg by mouth at bedtime as needed for sleep. For sleep   Yes Historical Provider, MD  etonogestrel-ethinyl estradiol (NUVARING) 0.12-0.015 MG/24HR vaginal ring Place 1 each vaginally every 28 (twenty-eight) days.    Historical Provider, MD    Social History:  reports that she has never smoked. She has never used smokeless tobacco. She reports that  drinks alcohol. She reports that she does not use illicit drugs.  Family History  Problem Relation Age of Onset  . Adopted: Yes    Review of Systems:  Constitutional: Denies fever, chills, diaphoresis, appetite change and fatigue.  HEENT: Denies redness, hearing loss, ear pain, congestion, sore throat, rhinorrhea, sneezing, mouth sores, trouble swallowing, neck pain, neck stiffness and tinnitus.   Respiratory: Denies SOB, DOE, cough,  chest tightness,  and wheezing.   Cardiovascular: Denies chest pain, palpitations and leg swelling.  Gastrointestinal: Denies nausea, vomiting, abdominal pain, diarrhea, constipation, blood in stool and abdominal distention.  Genitourinary: Denies dysuria, urgency, frequency, hematuria, flank pain and difficulty urinating.  Endocrine: Denies: hot or cold intolerance, sweats, changes in hair or nails, polyuria, polydipsia. Musculoskeletal: Denies myalgias, back pain, joint swelling, arthralgias and gait problem.  Skin: Denies pallor, rash and  wound.  Neurological: Denies dizziness, seizures, syncope, weakness, light-headedness, numbness and  Hematological: Denies adenopathy. Easy bruising, personal or family bleeding history  Psychiatric/Behavioral: Denies suicidal ideation, mood changes, confusion, nervousness, sleep disturbance and agitation   Physical Exam: Blood pressure 138/77, pulse 95, temperature 97.8 F (36.6 C), temperature source Oral, resp. rate 18, last menstrual period 11/22/2012, SpO2 97.00%. Gen:AA Ox3, eyes closed HEENT: [place]/AT/PERRl/EOMI/ Neck: supple, no JVD, no LAD, no bruits, no goiter. No Nuchal rigidity. CV: RRR, no M/R/G Lungs: CTA B Abd: S/NT/ND/+BS/no masses or organomegaly. Ext: no C/C/E, +pedal pulses  Labs on Admission:  Results for orders placed during the hospital encounter of 12/05/12 (from the past 48 hour(s))  CBC WITH DIFFERENTIAL     Status: Abnormal   Collection Time    12/05/12  5:34 PM      Result Value Range   WBC 9.9  4.0 - 10.5 K/uL   RBC 5.08  3.87 - 5.11 MIL/uL   Hemoglobin 15.2 (*) 12.0 - 15.0 g/dL   HCT 16.1  09.6 - 04.5 %   MCV 86.4  78.0 - 100.0 fL   MCH 29.9  26.0 - 34.0 pg   MCHC 34.6  30.0 - 36.0 g/dL   RDW 40.9  81.1 - 91.4 %   Platelets 325  150 - 400 K/uL   Neutrophils Relative % 61  43 - 77 %   Neutro Abs 6.1  1.7 - 7.7 K/uL   Lymphocytes Relative 33  12 - 46 %   Lymphs Abs 3.3  0.7 - 4.0 K/uL   Monocytes Relative 4  3 - 12 %   Monocytes Absolute 0.4  0.1 - 1.0 K/uL   Eosinophils Relative 2  0 - 5 %   Eosinophils Absolute 0.2  0.0 - 0.7 K/uL   Basophils Relative 0  0 - 1 %   Basophils Absolute 0.0  0.0 - 0.1 K/uL  BASIC METABOLIC PANEL     Status: Abnormal   Collection Time    12/05/12  5:34 PM      Result Value Range   Sodium 134 (*) 135 - 145 mEq/L   Potassium 4.0  3.5 - 5.1 mEq/L   Chloride 100  96 - 112 mEq/L   CO2 20  19 - 32 mEq/L   Glucose, Bld 271 (*) 70 - 99 mg/dL   BUN 13  6 - 23 mg/dL   Creatinine, Ser 7.82  0.50 - 1.10 mg/dL   Calcium  9.4  8.4 - 95.6 mg/dL   GFR calc non Af Amer >90  >90 mL/min   GFR calc Af Amer >90  >90 mL/min   Comment:            The eGFR has been calculated     using the CKD EPI equation.     This calculation has not been     validated in all clinical     situations.     eGFR's persistently     <90 mL/min signify     possible Chronic Kidney Disease.  Radiological Exams on Admission: [name]  12/05/2012   *RADIOLOGY REPORT*  Clinical Data:  37 year old female with intractable migraine.  MRI HEAD WITHOUT AND WITH CONTRAST  Technique: Multiplanar, multiecho pulse sequences of the brain and surrounding structures were obtained according to standard protocol without and with intravenous contrast.  Contrast: 15mL MULTIHANCE GADOBENATE DIMEGLUMINE 529 MG/ML IV SOLN  Comparison: Head CT without contrast 11/27/2012.  Findings:  Normal cerebral volume. No restricted diffusion to suggest acute infarction.  No midline shift, mass effect, evidence of mass lesion, ventriculomegaly, extra-axial collection or acute intracranial hemorrhage.  Cervicomedullary junction and pituitary are within normal limits.  Major intracranial vascular flow voids are preserved. Wallace Cullens and white matter signal is within normal limits throughout the brain.  No abnormal enhancement identified. Negative visualized cervical spine.  Normal bone marrow signal. Visualized orbit soft tissues are within normal limits.  Visualized paranasal sinuses and mastoids are clear.  Negative scalp soft tissues.  IMPRESSION: 1. Normal MRI appearance of the brain. 2.  MRA findings are below.  MRA HEAD WITHOUT CONTRAST  Technique: Angiographic images of the Circle of Willis were obtained using MRA technique without  intravenous contrast.  Findings:  Antegrade flow in the posterior circulation with fairly codominant distal vertebral arteries.  Normal left PICA origin. Dominant right AICA.  Normal vertebrobasilar junction and basilar artery.  Normal  SCA and PCA origins.  Normal posterior communicating arteries, the left is larger.  Normal bilateral PCA branches.  Antegrade flow in both ICA siphons.  No ICA stenosis.  Ophthalmic and posterior communicating artery origins are within normal limits.  Normal carotid termini.  Normal MCA and MCA origins.  Anterior communicating artery and visualized ACA branches are within normal limits.  Visualized bilateral MCA branches are within normal limits.  IMPRESSION: Negative intracranial MRA.   Original Report Authenticated By: Erskine Speed, M.D.   [name]  12/05/2012   *RADIOLOGY REPORT*  Clinical Data:  37 year old female with intractable migraine.  MRI HEAD WITHOUT AND WITH CONTRAST  Technique: Multiplanar, multiecho pulse sequences of the brain and surrounding structures were obtained according to standard protocol without and with intravenous contrast.  Contrast: 15mL MULTIHANCE GADOBENATE DIMEGLUMINE 529 MG/ML IV SOLN  Comparison: Head CT without contrast 11/27/2012.  Findings:  Normal cerebral volume. No restricted diffusion to suggest acute infarction.  No midline shift, mass effect, evidence of mass lesion, ventriculomegaly, extra-axial collection or acute intracranial hemorrhage.  Cervicomedullary junction and pituitary are within normal limits.  Major intracranial vascular flow voids are preserved. Wallace Cullens and white matter signal is within normal limits throughout the brain.  No abnormal enhancement identified. Negative visualized cervical spine.  Normal bone marrow signal. Visualized orbit soft tissues are within normal limits.  Visualized paranasal sinuses and mastoids are clear.  Negative scalp soft tissues.  IMPRESSION: 1. Normal MRI appearance of the brain. 2.  MRA findings are below.  MRA HEAD WITHOUT CONTRAST  Technique: Angiographic images of the Circle of Willis were obtained using MRA technique without  intravenous contrast.  Findings:  Antegrade flow in the posterior circulation with  fairly codominant distal vertebral arteries.  Normal left PICA origin. Dominant right AICA.  Normal vertebrobasilar junction and basilar artery.  Normal SCA and PCA origins.  Normal posterior communicating arteries, the left is larger.  Normal bilateral PCA branches.  Antegrade flow in both ICA siphons.  No ICA stenosis.  Ophthalmic and posterior communicating artery origins are within normal limits.  Normal carotid termini.  Normal MCA and MCA origins.  Anterior communicating artery and visualized ACA branches are within normal limits.  Visualized bilateral MCA branches are within normal limits.  IMPRESSION: Negative intracranial MRA.   Original Report Authenticated By: Erskine Speed, M.D.    Assessment/Plan Active Problems:   Migraine without aura, with intractable migraine, so stated, without mention of status migrainosus   IDDM (insulin dependent diabetes mellitus)  Migraine Headache -This is chronic in nature for her. -This seems like a pretty severe episode. -Dr. Anne Hahn' note from today reviewed. -Neurology to consult and write orders for the DHE 45 protocol. -PRN antiemetics.  IDDM -Check A1c. -Continue home dose of lantus and place on SSI while in the hospital.  DVT Prophylaxis -Lovenox.   Time Spent on Admission: 55 minutes  HERNANDEZ ACOSTA,ESTELA [facility] Pager: [phone] 12/05/2012, 7:12 PM

## 2012-12-05 NOTE — Progress Notes (Signed)
Reason for visit: Intractable migraine  [name] is a 37 y.o. female  History of present illness:  [name] is a 37 year old right-handed white female with a history of diabetes. The patient indicates that she has had headaches off and on since 2012. The patient indicates that the headaches are in the right parietal area, and are associated with a sharp pain. The patient indicates that the headaches may persist for several months when they come on, occurring daily in nature. The patient indicates that the headaches then go away, and they will stay away for about 4 or 5 months, only to recur again. The patient has been on an impressive list of different medications, all of which have been relatively ineffective. The patient has tried Imitrex, gabapentin, Topamax, Flexeril, Seroquel, D.H.E. 45, Phenergan, verapamil, Toradol, naproxen, and prednisone. The patient indicates that the D.H.E. 45 may have helped transiently for 2 weeks. The patient has also been on lithium without benefit. The patient indicates that her headaches are always on the right side of the head, and may be associated with some neck stiffness at times. The patient is taking Fioricet with codeine for the headache, which does help some. The patient denies any nausea or vomiting, but she does have some blurring of vision. The patient feels somewhat dizzy, and her balance is off. The patient may have some tingling into the right shoulder and arm. The patient believes that she has had a CT scan of the brain before, but she is not sure that she has ever had a MRI of the head. The patient was followed through [facility] prior to moving to [place].  Past Medical History  Diagnosis Date  . Ovarian cyst   . Diabetes mellitus   . Depression   . Cluster headache syndrome, intractable   . Migraine without aura, with intractable migraine, so stated, without mention of status migrainosus 12/05/2012    Past Surgical History   Procedure Laterality Date  . Appendectomy    . Cholecystectomy    . Ovarian cyst removal    . Bunionectomy Bilateral     Family History  Problem Relation Age of Onset  . Adopted: Yes  . Family history unknown: Yes    Social history:  reports that she has never smoked. She has never used smokeless tobacco. She reports that  drinks alcohol. She reports that she does not use illicit drugs.  Medications:  Current Outpatient Prescriptions on File Prior to Visit  Medication Sig Dispense Refill  . butalbital-acetaminophen-caffeine (FIORICET WITH CODEINE) 50-325-40-30 MG per capsule Take 1 capsule by mouth every 4 (four) hours as needed for headache.      . clonazePAM (KLONOPIN) 0.5 MG tablet Take 0.5 mg by mouth 3 (three) times daily as needed for anxiety. For anxiety      . etonogestrel-ethinyl estradiol (NUVARING) 0.12-0.015 MG/24HR vaginal ring Place 1 each vaginally every 28 (twenty-eight) days.      . insulin aspart (NOVOLOG) 100 UNIT/ML injection Inject 10-20 Units into the skin 3 (three) times daily. PT USES SLIDING SCALE.      . insulin glargine (LANTUS) 100 UNIT/ML injection Inject 40 Units into the skin every morning.       . lurasidone (LATUDA) 40 MG TABS Take 40 mg by mouth daily. With food      . Multiple Vitamin (MULTIVITAMIN WITH MINERALS) TABS Take 1 tablet by mouth daily.      . predniSONE (DELTASONE) 10 MG tablet Take 10 mg by  mouth daily.      . promethazine (PHENERGAN) 25 MG tablet Take 25 mg by mouth every 6 (six) hours as needed for nausea.      Marland Kitchen zolpidem (AMBIEN) 10 MG tablet Take 10 mg by mouth at bedtime as needed for sleep. For sleep       No current facility-administered medications on file prior to visit.    Allergies:  Allergies  Allergen Reactions  . Sulfa Antibiotics Nausea And Vomiting  . Wellbutrin (Bupropion) Other (See Comments)    "Makes me suicidal."  . Amoxicillin Rash  . Codeine Rash    ROS:  Out of a complete 14 system review of  symptoms, the patient complains only of the following symptoms, and all other reviewed systems are negative.  Chills, weight gain, fatigue Blurred vision, eye pain Feeling cold Confusion, headache, weakness, depression  Blood pressure 125/80, pulse 100, temperature 99 F (37.2 C), temperature source Oral, height 5' 1.5" (1.562 m), weight 169 lb (76.658 kg), last menstrual period 11/22/2012.  Physical Exam  General: The patient is alert and cooperative at the time of the examination. The patient is minimally to moderately obese.  Head: Pupils are equal, round, and reactive to light. Discs are flat bilaterally.  Neck: The neck is supple, no carotid bruits are noted.  Respiratory: The respiratory examination is clear.  Cardiovascular: The cardiovascular examination reveals a regular rate and rhythm, no obvious murmurs or rubs are noted.  Skin: Extremities are without significant edema.  Neurologic Exam  Mental status:  Cranial nerves: Facial symmetry is present. There is good sensation of the face to pinprick and soft touch bilaterally. The strength of the facial muscles and the muscles to head turning and shoulder shrug are normal bilaterally. Speech is well enunciated, no aphasia or dysarthria is noted. Extraocular movements are full. Visual fields are full.  Motor: The motor testing reveals 5 over 5 strength of all 4 extremities. Good symmetric motor tone is noted throughout.  Sensory: Sensory testing is intact to pinprick, soft touch, vibration sensation, and position sense on all 4 extremities. No evidence of extinction is noted.  Coordination: Cerebellar testing reveals good finger-nose-finger and heel-to-shin bilaterally.  Gait and station: Gait is normal. Tandem gait is normal. Romberg is negative. No drift is seen.  Reflexes: Deep tendon reflexes are symmetric, but are depressed bilaterally. Toes are downgoing bilaterally.   Assessment/Plan:  One. Intractable  migraine  2. Diabetes  The patient been on a multitude of different medications without significant benefit. Treatment of her headaches is likely to be quite challenging. The patient will be given an injection of Depacon today, and she will be started on propranolol for the headache. The patient will have MRI evaluation of the brain. The patient may require hospitalization for D.H.E. 45 within the next week or so. The patient is on a prednisone Dosepak, without benefit.  Addendum: The use of IV Depacon offered no benefit. The patient desires to go to the hospital for D.H.E. 45 protocol.  Marlan Palau MD 12/05/2012 12:55 PM  [facility] [address] [place], [place] [address]  Phone [phone] Fax [phone]

## 2012-12-05 NOTE — Progress Notes (Signed)
Pt here seeing Dr. Anne Hahn.  Order for Depacon 1000mg  IV , may give additional 500mg  IV if needed.   Pt taken to treatment room.  Made comfortable in recliner, pillow under arm.  IV # 1 attempt to L AC, 2nd attempt to R [facility] with 23g butterfly needle inserted with good blood return, taped securely.   Infusion started at 1139.    Pt placed in reclining position, lights dimmed.  Instructed to keep R arm straight, arm on pillow for comfort.  Infusing well, Level 10 headache, pt crying on and off.  Sunglasses on.  1147 no change with level.  1154, level still 10.  No change.  Another 500mg  IV started.  I left room and when returned, IV rate sluggish.  No s/s infiltration or redness.   Reposition arm for flow, pt c/o burning.  I flushed with NS and did flush with no c/o, reattached tubing for infusion and did not flow.  Removed IV and placed bandage.  Dr. Anne Hahn made aware.  Pt had 1100mg   Depacon.  VSS  114/75, p- 84 at 1233.  Pt to ER for DHE 45 administration per Dr. Anne Hahn.  Dr. Anne Hahn spoke to ER RN French Ana.   Pt informed.  Her care will be thru hospitalist.

## 2012-12-05 NOTE — ED Notes (Signed)
Pt reports that she was sent by Dr. Anne Hahn from further evaluation of migraine headache that she has had for about 3 weeks. Reports that she received IV medication at the office without relief. States that her MD told her that she would need to come here and possibly be admitted. Reports some blurred vision and sensitivity to light.

## 2012-12-05 NOTE — ED Notes (Signed)
Pt. Unable to give urine at this time. Told pt when she was able to go to the restroom to let us know.

## 2012-12-05 NOTE — Consult Note (Signed)
Reason for Consult: Status migrainosus Referring Physician: Ardyth Harps  CC: Headache  HPI: [name] is an 37 y.o. female who reports having problems with migraines since 2012 after an eye surgery.  She has previously been seen in [place] until today when she had her first appointment with [facility].  Patient was referred here for further evaluation.  Patient reports that her headaches occur in clusters.  Her most current cluster has been present since May.  She was reasonably functioning with these headaches until Wednesday of last week when she was driving and felt a "pop" in her head.  Since that time her headaches have been worse.  She reports that her usual cluster headache is present that is a throbbing on top of her head in the front.  Since Wednesday she has had the addition of a pain in the back of her head on the right that is "as if a hot spike was being driven into her head".  Her headache has waxed and waned.  She was seen ar [facility] last evening and given analgesics with temporary improvement in her headaches.  Today it returned to a 10/10.  She was sent to the ED by her outpatient neurologist today and received Depacon that has helped but she reports that her headache is returning.   On an outpatient basis the patient has been taking Fioricet and Prednisone.  Past Medical History  Diagnosis Date  . Ovarian cyst   . Diabetes mellitus   . Depression   . Cluster headache syndrome, intractable   . Migraine without aura, with intractable migraine, so stated, without mention of status migrainosus 12/05/2012    Past Surgical History  Procedure Laterality Date  . Appendectomy    . Cholecystectomy    . Ovarian cyst removal    . Bunionectomy Bilateral     Family History  Problem Relation Age of Onset  . Adopted: Yes  Biologic father with DM  Social History:  reports that she has never smoked. She has never used smokeless tobacco. She reports that  drinks alcohol. She reports that she does  not use illicit drugs.  Allergies  Allergen Reactions  . Sulfa Antibiotics Nausea And Vomiting  . Wellbutrin (Bupropion) Other (See Comments)    "Makes me suicidal."  . Amoxicillin Rash  . Codeine Rash    Medications: I have reviewed the patient's current medications. Prior to Admission:  Current outpatient prescriptions: butalbital-acetaminophen-caffeine (FIORICET WITH CODEINE) 50-325-40-30 MG per capsule, Take 1 capsule by mouth every 4 (four) hours as needed for headache., Disp: , Rfl: ;   clonazePAM (KLONOPIN) 0.5 MG tablet, Take 0.5 mg by mouth 3 (three) times daily as needed for anxiety. For anxiety, Disp: , Rfl:  insulin aspart (NOVOLOG) 100 UNIT/ML injection, Inject 10-20 Units into the skin 3 (three) times daily. PT USES SLIDING SCALE., Disp: , Rfl: ;   insulin glargine (LANTUS) 100 UNIT/ML injection, Inject 40 Units into the skin every morning. , Disp: , Rfl: ;   lurasidone (LATUDA) 40 MG TABS, Take 40 mg by mouth daily. With food, Disp: , Rfl: ;  Multiple Vitamin (MULTIVITAMIN WITH MINERALS) TABS, Take 1 tablet by mouth daily., Disp: , Rfl:  predniSONE (DELTASONE) 10 MG tablet, Take 10 mg by mouth daily., Disp: , Rfl: ;   promethazine (PHENERGAN) 25 MG tablet, Take 25 mg by mouth every 6 (six) hours as needed for nausea., Disp: , Rfl: ;   propranolol (INDERAL) 10 MG tablet, One tablet twice a day for  one week, then take two tablets twice a day for one week, then take 3 tablets twice a day, Disp: 180 tablet, Rfl: 2 zolpidem (AMBIEN) 10 MG tablet, Take 10 mg by mouth at bedtime as needed for sleep. For sleep, Disp: , Rfl: ;   etonogestrel-ethinyl estradiol (NUVARING) 0.12-0.015 MG/24HR vaginal ring, Place 1 each vaginally every 28 (twenty-eight) days., Disp: , Rfl:   ROS: History obtained from the patient  General ROS: negative for - chills, fatigue, fever, night sweats, weight gain or weight loss Psychological ROS: negative for - behavioral disorder, hallucinations, memory  difficulties, mood swings or suicidal ideation Ophthalmic ROS: negative for - blurry vision, double vision, eye pain or loss of vision ENT ROS: negative for - epistaxis, nasal discharge, oral lesions, sore throat, tinnitus or vertigo Allergy and Immunology ROS: negative for - hives or itchy/watery eyes Hematological and Lymphatic ROS: negative for - bleeding problems, bruising or swollen lymph nodes Endocrine ROS: negative for - galactorrhea, hair pattern changes, polydipsia/polyuria or temperature intolerance Respiratory ROS: negative for - cough, hemoptysis, shortness of breath or wheezing Cardiovascular ROS: negative for - chest pain, dyspnea on exertion, edema or irregular heartbeat Gastrointestinal ROS: negative for - abdominal pain, diarrhea, hematemesis, nausea/vomiting or stool incontinence Genito-Urinary ROS: negative for - dysuria, hematuria, incontinence or urinary frequency/urgency Musculoskeletal ROS: negative for - joint swelling or muscular weakness Neurological ROS: as noted in HPI, episodic right upper extremity numbness Dermatological ROS: negative for rash and skin lesion changes  Physical Examination: Blood pressure 138/77, pulse 95, temperature 97.8 F (36.6 C), temperature source Oral, resp. rate 18, last menstrual period 11/22/2012, SpO2 97.00%.  Neurologic Examination Mental Status: Alert, oriented, thought content appropriate.  Speech fluent without evidence of aphasia.  Able to follow 3 step commands without difficulty. Cranial Nerves: II: Discs flat bilaterally; Visual fields grossly normal, pupils equal, round, reactive to light and accommodation III,IV, VI: ptosis not present, extra-ocular motions intact bilaterally V,VII: smile symmetric, facial light touch sensation normal bilaterally VIII: hearing normal bilaterally IX,X: gag reflex present XI: bilateral shoulder shrug XII: midline tongue extension Motor: Right : Upper extremity   5/5    Left:     Upper  extremity   5/5  Lower extremity   5/5     Lower extremity   5/5 Tone and bulk:normal tone throughout; no atrophy noted Sensory: Pinprick and light touch intact throughout, bilaterally Deep Tendon Reflexes: 2+ in the upper extremities, trace at the knees and absent at the ankles.   Plantars: Right: mute   Left: mute Cerebellar: normal finger-to-nose and normal heel-to-shin test Gait: not tested due to pain CV: pulses palpable throughout   Laboratory Studies:   Basic Metabolic Panel:  Recent Labs Lab 12/05/12 1734  NA 134*  K 4.0  CL 100  CO2 20  GLUCOSE 271*  BUN 13  CREATININE 0.61  CALCIUM 9.4    Liver Function Tests: No results found for this basename: AST, ALT, ALKPHOS, BILITOT, PROT, ALBUMIN,  in the last 168 hours No results found for this basename: LIPASE, AMYLASE,  in the last 168 hours No results found for this basename: AMMONIA,  in the last 168 hours  CBC:  Recent Labs Lab 12/05/12 1734  WBC 9.9  NEUTROABS 6.1  HGB 15.2*  HCT 43.9  MCV 86.4  PLT 325    Cardiac Enzymes: No results found for this basename: CKTOTAL, CKMB, CKMBINDEX, TROPONINI,  in the last 168 hours  BNP: No components found with this basename: POCBNP,  CBG: No results found for this basename: GLUCAP,  in the last 168 hours  Microbiology:   Coagulation Studies: No results found for this basename: LABPROT, INR,  in the last 72 hours  Urinalysis: No results found for this basename: COLORURINE, APPERANCEUR, LABSPEC, PHURINE, GLUCOSEU, HGBUR, BILIRUBINUR, KETONESUR, PROTEINUR, UROBILINOGEN, NITRITE, LEUKOCYTESUR,  in the last 168 hours  Lipid Panel:  No results found for this basename: chol, trig, hdl, cholhdl, vldl, ldlcalc    HgbA1C:  No results found for this basename: HGBA1C    Urine Drug Screen:   No results found for this basename: labopia, cocainscrnur, labbenz, amphetmu, thcu, labbarb    Alcohol Level: No results found for this basename: ETH,  in the last 168  hours   Imaging: [name]  12/05/2012   *RADIOLOGY REPORT*  Clinical Data:  37 year old female with intractable migraine.  MRI HEAD WITHOUT AND WITH CONTRAST  Technique: Multiplanar, multiecho pulse sequences of the brain and surrounding structures were obtained according to standard protocol without and with intravenous contrast.  Contrast: 15mL MULTIHANCE GADOBENATE DIMEGLUMINE 529 MG/ML IV SOLN  Comparison: Head CT without contrast 11/27/2012.  Findings:  Normal cerebral volume. No restricted diffusion to suggest acute infarction.  No midline shift, mass effect, evidence of mass lesion, ventriculomegaly, extra-axial collection or acute intracranial hemorrhage.  Cervicomedullary junction and pituitary are within normal limits.  Major intracranial vascular flow voids are preserved. Wallace Cullens and white matter signal is within normal limits throughout the brain.  No abnormal enhancement identified. Negative visualized cervical spine.  Normal bone marrow signal. Visualized orbit soft tissues are within normal limits.  Visualized paranasal sinuses and mastoids are clear.  Negative scalp soft tissues.  IMPRESSION: 1. Normal MRI appearance of the brain. 2.  MRA findings are below.  MRA HEAD WITHOUT CONTRAST  Technique: Angiographic images of the Circle of Willis were obtained using MRA technique without  intravenous contrast.  Findings:  Antegrade flow in the posterior circulation with fairly codominant distal vertebral arteries.  Normal left PICA origin. Dominant right AICA.  Normal vertebrobasilar junction and basilar artery.  Normal SCA and PCA origins.  Normal posterior communicating arteries, the left is larger.  Normal bilateral PCA branches.  Antegrade flow in both ICA siphons.  No ICA stenosis.  Ophthalmic and posterior communicating artery origins are within normal limits.  Normal carotid termini.  Normal MCA and MCA origins.  Anterior communicating artery and visualized ACA branches are  within normal limits.  Visualized bilateral MCA branches are within normal limits.  IMPRESSION: Negative intracranial MRA.   Original Report Authenticated By: Erskine Speed, M.D.   [name]  12/05/2012   *RADIOLOGY REPORT*  Clinical Data:  37 year old female with intractable migraine.  MRI HEAD WITHOUT AND WITH CONTRAST  Technique: Multiplanar, multiecho pulse sequences of the brain and surrounding structures were obtained according to standard protocol without and with intravenous contrast.  Contrast: 15mL MULTIHANCE GADOBENATE DIMEGLUMINE 529 MG/ML IV SOLN  Comparison: Head CT without contrast 11/27/2012.  Findings:  Normal cerebral volume. No restricted diffusion to suggest acute infarction.  No midline shift, mass effect, evidence of mass lesion, ventriculomegaly, extra-axial collection or acute intracranial hemorrhage.  Cervicomedullary junction and pituitary are within normal limits.  Major intracranial vascular flow voids are preserved. Wallace Cullens and white matter signal is within normal limits throughout the brain.  No abnormal enhancement identified. Negative visualized cervical spine.  Normal bone marrow signal. Visualized orbit soft tissues are within normal limits.  Visualized  paranasal sinuses and mastoids are clear.  Negative scalp soft tissues.  IMPRESSION: 1. Normal MRI appearance of the brain. 2.  MRA findings are below.  MRA HEAD WITHOUT CONTRAST  Technique: Angiographic images of the Circle of Willis were obtained using MRA technique without  intravenous contrast.  Findings:  Antegrade flow in the posterior circulation with fairly codominant distal vertebral arteries.  Normal left PICA origin. Dominant right AICA.  Normal vertebrobasilar junction and basilar artery.  Normal SCA and PCA origins.  Normal posterior communicating arteries, the left is larger.  Normal bilateral PCA branches.  Antegrade flow in both ICA siphons.  No ICA stenosis.  Ophthalmic and posterior communicating artery  origins are within normal limits.  Normal carotid termini.  Normal MCA and MCA origins.  Anterior communicating artery and visualized ACA branches are within normal limits.  Visualized bilateral MCA branches are within normal limits.  IMPRESSION: Negative intracranial MRA.   Original Report Authenticated By: Erskine Speed, M.D.     Assessment/Plan: 37 year old female with a long history of migraines with worsening for the past 10 days.  Patient reports hearing a "pop" in her head and having intermittent right upper extremity numbness since that time.  MRI and MRA of the head were performed in work up and have been reviewed.  Both are normal and show no significant abnormalities.  Has been on Fioricet and Prednisone on an outpatient basis.  Has a long list of medications that she has been tried on in the past, most of which have not been successful at aborting her headaches (including Imitrex, Lithium, Verapamil, Topamax, Toradol).  She reports that she has had some favorable response to DHE in the past.  Neurological examination at this time is unremarkable.    Recommendations:   1.  IV DHE 0.5mg  q 8hrs with 10mg  Reglan as a pretreatment.  First dose to start now.  Patient should tolerate DHE since she has had this treatment in the past without incident. 2.  2LO2 3.  Would continue Prednisone at current dose to help with prophylaxis but would not bolus at this time due to DM 4.  Would discontinue Fioricet since it frequent use may have led to current intractable nature of headache.    Thana Farr, MD [facility] [phone] 12/05/2012, 7:26 PM

## 2012-12-05 NOTE — ED Notes (Signed)
Pt sent here by neurologist. sts he gave her IV meds in office and unable to control pain. sts sent here for possible 3 days admission for injections. Per MD note is in EPIC and if any questions to call his office.

## 2012-12-05 NOTE — ED Notes (Signed)
Attempted report x1. 

## 2012-12-06 DIAGNOSIS — R110 Nausea: Secondary | ICD-10-CM

## 2012-12-06 DIAGNOSIS — F411 Generalized anxiety disorder: Secondary | ICD-10-CM

## 2012-12-06 LAB — GLUCOSE, CAPILLARY: Glucose-Capillary: 133 mg/dL — ABNORMAL HIGH (ref 70–99)

## 2012-12-06 LAB — HEMOGLOBIN A1C
Hgb A1c MFr Bld: 10.2 % — ABNORMAL HIGH (ref <5.7)
Mean Plasma Glucose: 246 mg/dL — ABNORMAL HIGH (ref <117)

## 2012-12-06 MED ORDER — DIPHENHYDRAMINE HCL 50 MG/ML IJ SOLN
25.0000 mg | Freq: Once | INTRAMUSCULAR | Status: AC
  Administered 2012-12-06: 25 mg via INTRAVENOUS
  Filled 2012-12-06: qty 1

## 2012-12-06 MED ORDER — PANTOPRAZOLE SODIUM 40 MG PO TBEC
40.0000 mg | DELAYED_RELEASE_TABLET | Freq: Every day | ORAL | Status: DC
  Administered 2012-12-06 – 2012-12-07 (×2): 40 mg via ORAL
  Filled 2012-12-06 (×2): qty 1

## 2012-12-06 NOTE — Care Management Note (Signed)
Cm spoke with patient at bedside concerning MD consult for medication assistance. Per pt recently changed medical coverage, prior to the intiation of present [employer] payor the copay for her insulin was 150.00. Pt states unsure what current co pay is. Cm informed pt that a benefits check is in process for current copay but CM unable to provide this information to patient until Monday. Cm informed pt to call payor by contact number provided on insurance card to inquire about cost of insulin if discharged prior to Monday. Pt states having insulin and Dm supplies to last for a couple of weeks post discharge. Pt is ineligible for [facility] program and assistance on [employer] website related to having private insurance.   Roxy Manns Hiroshi Krummel,RN,BSN [phone]

## 2012-12-06 NOTE — Progress Notes (Signed)
NEURO HOSPITALIST PROGRESS NOTE   SUBJECTIVE:                                                                                                                        Reports slight improvement after initiating DHE protocol. Feels nauseated. No other neurological complains.   OBJECTIVE:                                                                                                                           Vital signs in last 24 hours: Temp:  [97.8 F (36.6 C)-99 F (37.2 C)] 99 F (37.2 C) (06/07 4098) Pulse Rate:  [72-96] 72 (06/07 0632) Resp:  [16-22] 16 (06/07 1191) BP: (104-138)/(63-77) 117/63 mmHg (06/07 0632) SpO2:  [97 %-99 %] 97 % (06/07 4782) Weight:  [71.578 kg (157 lb 12.8 oz)] 71.578 kg (157 lb 12.8 oz) (06/06 2045)  Intake/Output from previous day: 06/06 0701 - 06/07 0700 In: 860 [I.V.:860] Out: 500 [Urine:500] Intake/Output this shift:   Nutritional status: General  Past Medical History  Diagnosis Date  . Ovarian cyst   . Depression   . Cluster headache syndrome, intractable   . Migraine without aura, with intractable migraine, so stated, without mention of status migrainosus 12/05/2012  . Type I diabetes mellitus       Neurologic Exam:  Mental Status:  Alert, oriented, thought content appropriate. Speech fluent without evidence of aphasia. Able to follow 3 step commands without difficulty.  Cranial Nerves:  II: Discs flat bilaterally; Visual fields grossly normal, pupils equal, round, reactive to light and accommodation  III,IV, VI: ptosis not present, extra-ocular motions intact bilaterally  V,VII: smile symmetric, facial light touch sensation normal bilaterally  VIII: hearing normal bilaterally  IX,X: gag reflex present  XI: bilateral shoulder shrug  XII: midline tongue extension  Motor:  Right : Upper extremity 5/5 Left: Upper extremity 5/5  Lower extremity 5/5 Lower extremity 5/5  Tone and bulk:normal tone  throughout; no atrophy noted  Sensory: Pinprick and light touch intact throughout, bilaterally  Deep Tendon Reflexes: 2+ in the upper extremities, trace at the knees and absent at the ankles.  Plantars:  Right: mute Left: mute  Cerebellar:  normal finger-to-nose and normal heel-to-shin test  Gait:  not tested due to pain  CV: pulses palpable throughout     Lab Results: No results found for this basename: cbc, bmp, coags, chol, tri, ldl, hga1c   Lipid Panel No results found for this basename: CHOL, TRIG, HDL, CHOLHDL, VLDL, LDLCALC,  in the last 72 hours  Studies/Results: [name]  12/05/2012   *RADIOLOGY REPORT*  Clinical Data:  37 year old female with intractable migraine.  MRI HEAD WITHOUT AND WITH CONTRAST  Technique: Multiplanar, multiecho pulse sequences of the brain and surrounding structures were obtained according to standard protocol without and with intravenous contrast.  Contrast: 15mL MULTIHANCE GADOBENATE DIMEGLUMINE 529 MG/ML IV SOLN  Comparison: Head CT without contrast 11/27/2012.  Findings:  Normal cerebral volume. No restricted diffusion to suggest acute infarction.  No midline shift, mass effect, evidence of mass lesion, ventriculomegaly, extra-axial collection or acute intracranial hemorrhage.  Cervicomedullary junction and pituitary are within normal limits.  Major intracranial vascular flow voids are preserved. Wallace Cullens and white matter signal is within normal limits throughout the brain.  No abnormal enhancement identified. Negative visualized cervical spine.  Normal bone marrow signal. Visualized orbit soft tissues are within normal limits.  Visualized paranasal sinuses and mastoids are clear.  Negative scalp soft tissues.  IMPRESSION: 1. Normal MRI appearance of the brain. 2.  MRA findings are below.  MRA HEAD WITHOUT CONTRAST  Technique: Angiographic images of the Circle of Willis were obtained using MRA technique without  intravenous contrast.  Findings:   Antegrade flow in the posterior circulation with fairly codominant distal vertebral arteries.  Normal left PICA origin. Dominant right AICA.  Normal vertebrobasilar junction and basilar artery.  Normal SCA and PCA origins.  Normal posterior communicating arteries, the left is larger.  Normal bilateral PCA branches.  Antegrade flow in both ICA siphons.  No ICA stenosis.  Ophthalmic and posterior communicating artery origins are within normal limits.  Normal carotid termini.  Normal MCA and MCA origins.  Anterior communicating artery and visualized ACA branches are within normal limits.  Visualized bilateral MCA branches are within normal limits.  IMPRESSION: Negative intracranial MRA.   Original Report Authenticated By: Erskine Speed, M.D.   [name]  12/05/2012   *RADIOLOGY REPORT*  Clinical Data:  37 year old female with intractable migraine.  MRI HEAD WITHOUT AND WITH CONTRAST  Technique: Multiplanar, multiecho pulse sequences of the brain and surrounding structures were obtained according to standard protocol without and with intravenous contrast.  Contrast: 15mL MULTIHANCE GADOBENATE DIMEGLUMINE 529 MG/ML IV SOLN  Comparison: Head CT without contrast 11/27/2012.  Findings:  Normal cerebral volume. No restricted diffusion to suggest acute infarction.  No midline shift, mass effect, evidence of mass lesion, ventriculomegaly, extra-axial collection or acute intracranial hemorrhage.  Cervicomedullary junction and pituitary are within normal limits.  Major intracranial vascular flow voids are preserved. Wallace Cullens and white matter signal is within normal limits throughout the brain.  No abnormal enhancement identified. Negative visualized cervical spine.  Normal bone marrow signal. Visualized orbit soft tissues are within normal limits.  Visualized paranasal sinuses and mastoids are clear.  Negative scalp soft tissues.  IMPRESSION: 1. Normal MRI appearance of the brain. 2.  MRA findings are below.  MRA HEAD  WITHOUT CONTRAST  Technique: Angiographic images of the Circle of Willis were obtained using MRA technique without  intravenous contrast.  Findings:  Antegrade flow in the posterior circulation with fairly codominant distal vertebral arteries.  Normal left PICA origin. Dominant right AICA.  Normal vertebrobasilar junction  and basilar artery.  Normal SCA and PCA origins.  Normal posterior communicating arteries, the left is larger.  Normal bilateral PCA branches.  Antegrade flow in both ICA siphons.  No ICA stenosis.  Ophthalmic and posterior communicating artery origins are within normal limits.  Normal carotid termini.  Normal MCA and MCA origins.  Anterior communicating artery and visualized ACA branches are within normal limits.  Visualized bilateral MCA branches are within normal limits.  IMPRESSION: Negative intracranial MRA.   Original Report Authenticated By: Erskine Speed, M.D.    MEDICATIONS                                                                                                                       I have reviewed the patient's current medications.  ASSESSMENT/PLAN:                                                                                                             37 years old with refractory status migrainous, admitted last night for DHE protocol. Recommend: 1) continue DHE 2) Reglan protocol could hep nausea and HA. 3) will follow up  Wyatt Portela, MD [facility] [phone]  12/06/2012, 12:57 PM

## 2012-12-06 NOTE — Progress Notes (Signed)
[facility] PROGRESS NOTE  [name] JXB:[id number] DOB: [birth year] DOA: 12/05/2012 PCP: Gretel Acre, MD  Assessment/Plan: 1-Refractory migraine: -continue DHE protocol -follow neurology rec's -will discontinue fioricet  2-Nausea: will use reglan and benadryl  3-Type Diabetes: continue lantus and SSI; A1C pending; but CBG's in the 160-180 range  4-Anxiety: Flat affect on exam; raise concerns for bipolar component. Continue PRN xanax.  GI prophylaxis: protonix  [id number]  Code Status: Full Family Communication: no family at bedside Disposition Plan: home when medically stable.   Consultants:  Neurology  Procedures:  See below for x-ray reports  Antibiotics:  none  HPI/Subjective: Afebrile, complaining  Of HA, nausea and difficulty sleeping. Overall feels better after DHE protocol was started.  Objective: Filed Vitals:   12/05/12 1407 12/05/12 1926 12/05/12 2045 12/06/12 0632  BP: 138/77 132/69 104/64 117/63  Pulse: 95 96 84 72  Temp: 97.8 F (36.6 C)  98.2 F (36.8 C) 99 F (37.2 C)  TempSrc: Oral  Oral Oral  Resp: 18 22 16 16   Height:   5\' 1"  (1.549 m)   Weight:   71.578 kg (157 lb 12.8 oz)   SpO2: 97% 99% 97% 97%    Intake/Output Summary (Last 24 hours) at 12/06/12 1305 Last data filed at 12/06/12 0500  Gross per 24 hour  Intake    860 ml  Output    500 ml  Net    360 ml   Filed Weights   12/05/12 2045  Weight: 71.578 kg (157 lb 12.8 oz)    Exam:   General:  Flat affect, complaining of HA and difficulty sleeping (overall slightly better)  Cardiovascular: S1 and S2, no rubs, no gallops  Respiratory: CTA bilaterally  Abdomen: soft, NT, ND, positive BS  Musculoskeletal: FROM, no edema, no erythema   Data Reviewed: Basic Metabolic Panel:  Recent Labs Lab 12/05/12 1734  NA 134*  K 4.0  CL 100  CO2 20  GLUCOSE 271*  BUN 13  CREATININE 0.61  CALCIUM 9.4   CBC:  Recent Labs Lab 12/05/12 1734  WBC 9.9   NEUTROABS 6.1  HGB 15.2*  HCT 43.9  MCV 86.4  PLT 325   CBG:  Recent Labs Lab 12/05/12 2225 12/06/12 0814  GLUCAP 182* 161*     Studies: [name]  12/05/2012   *RADIOLOGY REPORT*  Clinical Data:  37 year old female with intractable migraine.  MRI HEAD WITHOUT AND WITH CONTRAST  Technique: Multiplanar, multiecho pulse sequences of the brain and surrounding structures were obtained according to standard protocol without and with intravenous contrast.  Contrast: 15mL MULTIHANCE GADOBENATE DIMEGLUMINE 529 MG/ML IV SOLN  Comparison: Head CT without contrast 11/27/2012.  Findings:  Normal cerebral volume. No restricted diffusion to suggest acute infarction.  No midline shift, mass effect, evidence of mass lesion, ventriculomegaly, extra-axial collection or acute intracranial hemorrhage.  Cervicomedullary junction and pituitary are within normal limits.  Major intracranial vascular flow voids are preserved. Wallace Cullens and white matter signal is within normal limits throughout the brain.  No abnormal enhancement identified. Negative visualized cervical spine.  Normal bone marrow signal. Visualized orbit soft tissues are within normal limits.  Visualized paranasal sinuses and mastoids are clear.  Negative scalp soft tissues.  IMPRESSION: 1. Normal MRI appearance of the brain. 2.  MRA findings are below.  MRA HEAD WITHOUT CONTRAST  Technique: Angiographic images of the Circle of Willis were obtained using MRA technique without  intravenous contrast.  Findings:  Antegrade flow in the posterior  circulation with fairly codominant distal vertebral arteries.  Normal left PICA origin. Dominant right AICA.  Normal vertebrobasilar junction and basilar artery.  Normal SCA and PCA origins.  Normal posterior communicating arteries, the left is larger.  Normal bilateral PCA branches.  Antegrade flow in both ICA siphons.  No ICA stenosis.  Ophthalmic and posterior communicating artery origins are within  normal limits.  Normal carotid termini.  Normal MCA and MCA origins.  Anterior communicating artery and visualized ACA branches are within normal limits.  Visualized bilateral MCA branches are within normal limits.  IMPRESSION: Negative intracranial MRA.   Original Report Authenticated By: Erskine Speed, M.D.   [name]  12/05/2012   *RADIOLOGY REPORT*  Clinical Data:  37 year old female with intractable migraine.  MRI HEAD WITHOUT AND WITH CONTRAST  Technique: Multiplanar, multiecho pulse sequences of the brain and surrounding structures were obtained according to standard protocol without and with intravenous contrast.  Contrast: 15mL MULTIHANCE GADOBENATE DIMEGLUMINE 529 MG/ML IV SOLN  Comparison: Head CT without contrast 11/27/2012.  Findings:  Normal cerebral volume. No restricted diffusion to suggest acute infarction.  No midline shift, mass effect, evidence of mass lesion, ventriculomegaly, extra-axial collection or acute intracranial hemorrhage.  Cervicomedullary junction and pituitary are within normal limits.  Major intracranial vascular flow voids are preserved. Wallace Cullens and white matter signal is within normal limits throughout the brain.  No abnormal enhancement identified. Negative visualized cervical spine.  Normal bone marrow signal. Visualized orbit soft tissues are within normal limits.  Visualized paranasal sinuses and mastoids are clear.  Negative scalp soft tissues.  IMPRESSION: 1. Normal MRI appearance of the brain. 2.  MRA findings are below.  MRA HEAD WITHOUT CONTRAST  Technique: Angiographic images of the Circle of Willis were obtained using MRA technique without  intravenous contrast.  Findings:  Antegrade flow in the posterior circulation with fairly codominant distal vertebral arteries.  Normal left PICA origin. Dominant right AICA.  Normal vertebrobasilar junction and basilar artery.  Normal SCA and PCA origins.  Normal posterior communicating arteries, the left is larger.   Normal bilateral PCA branches.  Antegrade flow in both ICA siphons.  No ICA stenosis.  Ophthalmic and posterior communicating artery origins are within normal limits.  Normal carotid termini.  Normal MCA and MCA origins.  Anterior communicating artery and visualized ACA branches are within normal limits.  Visualized bilateral MCA branches are within normal limits.  IMPRESSION: Negative intracranial MRA.   Original Report Authenticated By: Erskine Speed, M.D.    Scheduled Meds: . dihydroergotamine  0.5 mg Intravenous Q8H  . diphenhydrAMINE  25 mg Intravenous Once  . enoxaparin (LOVENOX) injection  40 mg Subcutaneous QHS  . insulin aspart  0-15 Units Subcutaneous TID WC  . insulin aspart  4 Units Subcutaneous TID WC  . insulin glargine  40 Units Subcutaneous q morning - 10a  . lurasidone  40 mg Oral Q breakfast  . metoCLOPramide (REGLAN) injection  10 mg Intravenous Q8H  . multivitamin with minerals  1 tablet Oral Daily  . predniSONE  10 mg Oral Q breakfast  . sodium chloride  3 mL Intravenous Q12H   Continuous Infusions:   Active Problems:   Migraine without aura, with intractable migraine, so stated, without mention of status migrainosus   IDDM (insulin dependent diabetes mellitus)    Time spent: >30 minutes    Cleaster Shiffer  [facility] Pager [phone]. If 7PM-7AM, please contact night-coverage at [email or web address], password [facility] [place] 12/06/2012, 1:05 PM  LOS:  1 day

## 2012-12-07 DIAGNOSIS — F319 Bipolar disorder, unspecified: Secondary | ICD-10-CM

## 2012-12-07 LAB — GLUCOSE, CAPILLARY
Glucose-Capillary: 298 mg/dL — ABNORMAL HIGH (ref 70–99)
Glucose-Capillary: 172 mg/dL — ABNORMAL HIGH (ref 70–99)

## 2012-12-07 MED ORDER — SUMATRIPTAN SUCCINATE 100 MG PO TABS: 100.0000 mg | ORAL_TABLET | ORAL | Status: DC | PRN

## 2012-12-07 MED ORDER — PREDNISONE 10 MG PO TABS: 20.0000 mg | ORAL_TABLET | Freq: Every day | ORAL | Status: DC

## 2012-12-07 MED ORDER — PANTOPRAZOLE SODIUM 40 MG PO TBEC: 40.0000 mg | DELAYED_RELEASE_TABLET | Freq: Every day | ORAL | Status: DC

## 2012-12-07 MED ORDER — DIPHENHYDRAMINE HCL 50 MG/ML IJ SOLN
25.0000 mg | Freq: Three times a day (TID) | INTRAMUSCULAR | Status: DC | PRN
  Administered 2012-12-07 – 2012-12-08 (×5): 25 mg via INTRAVENOUS
  Filled 2012-12-07 (×6): qty 1

## 2012-12-07 NOTE — Discharge Summary (Addendum)
Physician Discharge Summary  [name] ZOX:[id number] DOB: 1975-12-31 DOA: 12/05/2012  PCP: Gretel Acre, MD  Admit date: 12/05/2012 Discharge date: 12/08/2012  Time spent: >30 minutes  Recommendations for Outpatient Follow-up:  1. Reassess diabetes and CBG's especially with higher dose steroids and adjust insulin accordingly (A1C 10.2)  Discharge Diagnoses:  Active Problems:   Migraine without aura, with intractable migraine, so stated, without mention of status migrainosus   IDDM (insulin dependent diabetes mellitus) Bipolar disorder Nausea  Discharge Condition: stable and improved.  Diet recommendation: low carbohydrates diet  Filed Weights   12/05/12 2045  Weight: 71.578 kg (157 lb 12.8 oz)    History of present illness:  37 y/o woman with PMH significant for severe migraines and IDDM, went to see her neurologist today, Dr. Anne Hahn for an acute migraine. These have been chronic for her. She was given depocon IV, however, this failed to resolve her HA. Dr. Anne Hahn sent her to the hospital for a DHE 45 protocol. ED has spoken with neurology, who will write orders for this protocol. We have been asked to admit her.   Hospital Course:  1-Refractory migraine:  -excellent response to DHE protocol  -patient received 5 doses with significant improvement in her headache -following neurology rec's will discharge on imitrex PRN and prednisone 20mg ; she will follow with Dr. Anne Hahn for further medication adjustments for migraine prevention and abortive therapy. -will discontinue fioricet   2-Nausea: will continue PRN phenergan and will also provide prescription for zofran ODT  3-Type Diabetes: continue lantus and SSI; A1C  10.2; will follow with PCP for further medication adjustments.  4-Bipolar disorder: continue latuda and PRN xanax.  5-GI prophylaxis: protonix  *Rest of medical problems remains stable and plan is to continue current medication regimen.   Procedures: See  below for x-ray reports  Consultations:  Neurology  Discharge Exam: Filed Vitals:   12/06/12 2144 12/07/12 0658 12/07/12 1050 12/07/12 1440  BP: 139/80 99/59 119/71 106/55  Pulse: 87 82 91 93  Temp: 98 F (36.7 C) 98.5 F (36.9 C) 97.3 F (36.3 C) 98.7 F (37.1 C)  TempSrc: Oral Oral Oral Oral  Resp: 18 18 18 16   Height:      Weight:      SpO2: 98% 98% 100% 97%    General: Flat affect, complaining of HA and difficulty sleeping (overall slightly better) Cardiovascular: S1 and S2, no rubs, no gallops  Respiratory: CTA bilaterally Abdomen: soft, NT, ND, positive BS  Musculoskeletal: FROM, no edema, no erythema    Discharge Instructions  Discharge Orders   Future Orders Complete By Expires     Discharge instructions  As directed     Comments:      Take medication as prescribed Arrange follow up with neurologist (Dr. Anne Hahn in 5-7 days) Keep yourself well hydrated        Medication List    STOP taking these medications       butalbital-acetaminophen-caffeine 50-325-40-30 MG per capsule  Commonly known as:  FIORICET WITH CODEINE      TAKE these medications       clonazePAM 0.5 MG tablet  Commonly known as:  KLONOPIN  Take 0.5 mg by mouth 3 (three) times daily as needed for anxiety. For anxiety     etonogestrel-ethinyl estradiol 0.12-0.015 MG/24HR vaginal ring  Commonly known as:  NUVARING  Place 1 each vaginally every 28 (twenty-eight) days.     insulin aspart 100 UNIT/ML injection  Commonly known as:  novoLOG  Inject  10-20 Units into the skin 3 (three) times daily. PT USES SLIDING SCALE.     insulin glargine 100 UNIT/ML injection  Commonly known as:  LANTUS  Inject 40 Units into the skin every morning.     lurasidone 40 MG Tabs  Commonly known as:  LATUDA  Take 40 mg by mouth daily. With food     multivitamin with minerals Tabs  Take 1 tablet by mouth daily.     ondansetron 4 MG disintegrating tablet  Commonly known as:  ZOFRAN ODT  Take 1 tablet  (4 mg total) by mouth every 8 (eight) hours as needed for nausea.     pantoprazole 40 MG tablet  Commonly known as:  PROTONIX  Take 1 tablet (40 mg total) by mouth daily at 12 noon.     predniSONE 10 MG tablet  Commonly known as:  DELTASONE  Take 2 tablets (20 mg total) by mouth daily.     promethazine 25 MG tablet  Commonly known as:  PHENERGAN  Take 25 mg by mouth every 6 (six) hours as needed for nausea.     propranolol 10 MG tablet  Commonly known as:  INDERAL  One tablet twice a day for one week, then take two tablets twice a day for one week, then take 3 tablets twice a day     SUMAtriptan 100 MG tablet  Commonly known as:  IMITREX  Take 1 tablet (100 mg total) by mouth every 2 (two) hours as needed for migraine.     zolpidem 10 MG tablet  Commonly known as:  AMBIEN  Take 10 mg by mouth at bedtime as needed for sleep. For sleep       Allergies  Allergen Reactions  . Sulfa Antibiotics Nausea And Vomiting  . Wellbutrin (Bupropion) Other (See Comments)    "Makes me suicidal."  . Amoxicillin Rash  . Codeine Rash       Follow-up Information   Follow up with Lesly Dukes, MD In 5 days.   Contact information:   [address] [place] [address] [phone]        The results of significant diagnostics from this hospitalization (including imaging, microbiology, ancillary and laboratory) are listed below for reference.    Significant Diagnostic Studies: Ct Head Wo Contrast  11/27/2012   *RADIOLOGY REPORT*  Clinical Data:  History of headache and photophobia.  CT HEAD WITHOUT CONTRAST  Technique: Contiguous axial images were obtained from the base of the skull through the vertex without contrast  Comparison:  None  Findings:  There is no evidence of brain mass, brain hemorrhage, or acute infarction.  The ventricular system is normal size and shape.  There is no evidence of shift of midline structures, parenchymal lesion, or subdural or epidural  hematoma.  The calvarium is intact.  Mastoids are well aerated.  No air fluid level is seen in the paranasal sinuses.  There is minimal maxillary mucosal thickening.  There is a small retention cyst or polyp in the lateral aspect of right maxillary sinus.  IMPRESSION: There is no evidence of brain mass, brain hemorrhage, or acute infarction.  No acute or active process is seen.  No skull lesion is evident.  No air fluid level is seen in the paranasal sinuses.  There is minimal maxillary mucosal thickening.  There is a small retention cyst or polyp in the lateral aspect of right maxillary sinus.   Original Report Authenticated By: Onalee Hua Call   Mr Angiogram Head  Wo Contrast  12/05/2012   *RADIOLOGY REPORT*  Clinical Data:  37 year old female with intractable migraine.  MRI HEAD WITHOUT AND WITH CONTRAST  Technique: Multiplanar, multiecho pulse sequences of the brain and surrounding structures were obtained according to standard protocol without and with intravenous contrast.  Contrast: 15mL MULTIHANCE GADOBENATE DIMEGLUMINE 529 MG/ML IV SOLN  Comparison: Head CT without contrast 11/27/2012.  Findings:  Normal cerebral volume. No restricted diffusion to suggest acute infarction.  No midline shift, mass effect, evidence of mass lesion, ventriculomegaly, extra-axial collection or acute intracranial hemorrhage.  Cervicomedullary junction and pituitary are within normal limits.  Major intracranial vascular flow voids are preserved. Wallace Cullens and white matter signal is within normal limits throughout the brain.  No abnormal enhancement identified. Negative visualized cervical spine.  Normal bone marrow signal. Visualized orbit soft tissues are within normal limits.  Visualized paranasal sinuses and mastoids are clear.  Negative scalp soft tissues.  IMPRESSION: 1. Normal MRI appearance of the brain. 2.  MRA findings are below.  MRA HEAD WITHOUT CONTRAST  Technique: Angiographic images of the Circle of Willis were obtained using  MRA technique without  intravenous contrast.  Findings:  Antegrade flow in the posterior circulation with fairly codominant distal vertebral arteries.  Normal left PICA origin. Dominant right AICA.  Normal vertebrobasilar junction and basilar artery.  Normal SCA and PCA origins.  Normal posterior communicating arteries, the left is larger.  Normal bilateral PCA branches.  Antegrade flow in both ICA siphons.  No ICA stenosis.  Ophthalmic and posterior communicating artery origins are within normal limits.  Normal carotid termini.  Normal MCA and MCA origins.  Anterior communicating artery and visualized ACA branches are within normal limits.  Visualized bilateral MCA branches are within normal limits.  IMPRESSION: Negative intracranial MRA.   Original Report Authenticated By: Erskine Speed, M.D.   [name]  12/05/2012   *RADIOLOGY REPORT*  Clinical Data:  37 year old female with intractable migraine.  MRI HEAD WITHOUT AND WITH CONTRAST  Technique: Multiplanar, multiecho pulse sequences of the brain and surrounding structures were obtained according to standard protocol without and with intravenous contrast.  Contrast: 15mL MULTIHANCE GADOBENATE DIMEGLUMINE 529 MG/ML IV SOLN  Comparison: Head CT without contrast 11/27/2012.  Findings:  Normal cerebral volume. No restricted diffusion to suggest acute infarction.  No midline shift, mass effect, evidence of mass lesion, ventriculomegaly, extra-axial collection or acute intracranial hemorrhage.  Cervicomedullary junction and pituitary are within normal limits.  Major intracranial vascular flow voids are preserved. Wallace Cullens and white matter signal is within normal limits throughout the brain.  No abnormal enhancement identified. Negative visualized cervical spine.  Normal bone marrow signal. Visualized orbit soft tissues are within normal limits.  Visualized paranasal sinuses and mastoids are clear.  Negative scalp soft tissues.  IMPRESSION: 1. Normal MRI  appearance of the brain. 2.  MRA findings are below.  MRA HEAD WITHOUT CONTRAST  Technique: Angiographic images of the Circle of Willis were obtained using MRA technique without  intravenous contrast.  Findings:  Antegrade flow in the posterior circulation with fairly codominant distal vertebral arteries.  Normal left PICA origin. Dominant right AICA.  Normal vertebrobasilar junction and basilar artery.  Normal SCA and PCA origins.  Normal posterior communicating arteries, the left is larger.  Normal bilateral PCA branches.  Antegrade flow in both ICA siphons.  No ICA stenosis.  Ophthalmic and posterior communicating artery origins are within normal limits.  Normal carotid termini.  Normal MCA and MCA origins.  Anterior communicating artery and visualized ACA branches are within normal limits.  Visualized bilateral MCA branches are within normal limits.  IMPRESSION: Negative intracranial MRA.   Original Report Authenticated By: Erskine Speed, M.D.   Labs: Basic Metabolic Panel:  Recent Labs Lab 12/05/12 1734  NA 134*  K 4.0  CL 100  CO2 20  GLUCOSE 271*  BUN 13  CREATININE 0.61  CALCIUM 9.4   CBC:  Recent Labs Lab 12/05/12 1734  WBC 9.9  NEUTROABS 6.1  HGB 15.2*  HCT 43.9  MCV 86.4  PLT 325   CBG:  Recent Labs Lab 12/05/12 2225 12/06/12 0814 12/06/12 1222 12/06/12 1723 12/07/12 0235  GLUCAP 182* 161* 133* 223* 298*    Signed:  Hettie Roselli  [facility] 12/07/2012, 5:11 PM

## 2012-12-08 DIAGNOSIS — Z794 Long term (current) use of insulin: Secondary | ICD-10-CM

## 2012-12-08 DIAGNOSIS — E119 Type 2 diabetes mellitus without complications: Secondary | ICD-10-CM

## 2012-12-08 DIAGNOSIS — G43019 Migraine without aura, intractable, without status migrainosus: Secondary | ICD-10-CM

## 2012-12-08 LAB — GLUCOSE, CAPILLARY
Glucose-Capillary: 107 mg/dL — ABNORMAL HIGH (ref 70–99)
Glucose-Capillary: 77 mg/dL (ref 70–99)

## 2012-12-08 MED ORDER — ONDANSETRON 4 MG PO TBDP: 4.0000 mg | ORAL_TABLET | Freq: Three times a day (TID) | ORAL | Status: DC | PRN

## 2012-12-08 NOTE — Progress Notes (Signed)
Patient seen and examined. Discharge plans and prescriptions remains appropriate. Will follow with neurology at discharge.  [name] [phone]

## 2012-12-09 ENCOUNTER — Telehealth: Payer: Self-pay | Admitting: *Deleted

## 2012-12-10 NOTE — ED Provider Notes (Signed)
Medical screening examination/treatment/procedure(s) were conducted as a shared visit with non-physician practitioner(s) and myself.  I personally evaluated the patient during the encounter  Derwood Kaplan, MD 12/10/12 1614

## 2012-12-31 ENCOUNTER — Ambulatory Visit: Payer: BC Managed Care – PPO | Admitting: Neurology

## 2013-01-01 ENCOUNTER — Ambulatory Visit (INDEPENDENT_AMBULATORY_CARE_PROVIDER_SITE_OTHER): Payer: BC Managed Care – PPO | Admitting: Neurology

## 2013-01-01 ENCOUNTER — Encounter: Payer: Self-pay | Admitting: Neurology

## 2013-01-01 VITALS — BP 123/86 | HR 110 | Wt 165.0 lb

## 2013-01-01 DIAGNOSIS — G43019 Migraine without aura, intractable, without status migrainosus: Secondary | ICD-10-CM

## 2013-01-01 MED ORDER — PROPRANOLOL HCL 40 MG PO TABS: 40.0000 mg | ORAL_TABLET | Freq: Two times a day (BID) | ORAL | Status: DC

## 2013-01-01 MED ORDER — ONDANSETRON HCL 4 MG PO TABS: 4.0000 mg | ORAL_TABLET | Freq: Three times a day (TID) | ORAL | Status: DC | PRN

## 2013-01-01 NOTE — Progress Notes (Signed)
Reason for visit: Headache  [name] is an 37 y.o. female  History of present illness:  [name] is a 37 year old right-handed white female with a history of intractable migraine headache. The patient has recently been hospitalized for a DHE 45 protocol, and she feels that this was helpful. Her headaches currently are still frequent, but less severe. The patient indicates that she has had 2 days without headache this week. The patient is on propranolol taking 30 mg twice daily, and she is tolerating the medication well. The patient believes that the Imitrex is helpful. The patient still finds that bright light activates her headache. The patient returns for an evaluation. In the hospital, MRI evaluation of the brain and MRA of the head were unremarkable.  Past Medical History  Diagnosis Date  . Ovarian cyst   . Depression   . Cluster headache syndrome, intractable   . Migraine without aura, with intractable migraine, so stated, without mention of status migrainosus 12/05/2012  . Type I diabetes mellitus     Past Surgical History  Procedure Laterality Date  . Appendectomy    . Cholecystectomy    . Ovarian cyst removal    . Bunionectomy Bilateral     Family History  Problem Relation Age of Onset  . Adopted: Yes    Social history:  reports that she has never smoked. She has never used smokeless tobacco. She reports that  drinks alcohol. She reports that she does not use illicit drugs.  Allergies:  Allergies  Allergen Reactions  . Sulfa Antibiotics Nausea And Vomiting  . Wellbutrin (Bupropion) Other (See Comments)    "Makes me suicidal."  . Amoxicillin Rash  . Codeine Rash    Medications:  Current Outpatient Prescriptions on File Prior to Visit  Medication Sig Dispense Refill  . clonazePAM (KLONOPIN) 0.5 MG tablet Take 0.5 mg by mouth 3 (three) times daily as needed for anxiety. For anxiety      . etonogestrel-ethinyl estradiol (NUVARING) 0.12-0.015 MG/24HR  vaginal ring Place 1 each vaginally every 28 (twenty-eight) days.      . insulin aspart (NOVOLOG) 100 UNIT/ML injection Inject 10-20 Units into the skin 3 (three) times daily. PT USES SLIDING SCALE.      . insulin glargine (LANTUS) 100 UNIT/ML injection Inject 40 Units into the skin every morning.       . lurasidone (LATUDA) 40 MG TABS Take 40 mg by mouth daily. With food      . Multiple Vitamin (MULTIVITAMIN WITH MINERALS) TABS Take 1 tablet by mouth daily.      . promethazine (PHENERGAN) 25 MG tablet Take 25 mg by mouth every 6 (six) hours as needed for nausea.      . SUMAtriptan (IMITREX) 100 MG tablet Take 1 tablet (100 mg total) by mouth every 2 (two) hours as needed for migraine.  20 tablet  0  . zolpidem (AMBIEN) 10 MG tablet Take 10 mg by mouth at bedtime as needed for sleep. For sleep       Current Facility-Administered Medications on File Prior to Visit  Medication Dose Route Frequency Provider Last Rate Last Dose  . valproate (DEPACON) 1,500 mg in sodium chloride 0.9 % 100 mL IVPB  1,500 mg Intravenous Continuous York Spaniel, MD   1,500 mg at 12/05/12 1139    ROS:  Out of a complete 14 system review of symptoms, the patient complains only of the following symptoms, and all other reviewed systems are negative.  Fevers, chills,  weight gain, fatigue Blurred vision, eye pain Confusion, headache Depression, anxiety Insomnia  Blood pressure 123/86, pulse 110, weight 165 lb (74.844 kg).  Physical Exam  General: The patient is alert and cooperative at the time of the examination. The patient is moderately obese.  Skin: No significant peripheral edema is noted.   Neurologic Exam  Cranial nerves: Facial symmetry is present. Speech is normal, no aphasia or dysarthria is noted. Extraocular movements are full. Visual fields are full.  Motor: The patient has good strength in all 4 extremities.  Coordination: The patient has good finger-nose-finger and heel-to-shin  bilaterally.  Gait and station: The patient has a normal gait. Tandem gait is normal. Romberg is negative. No drift is seen.  Reflexes: Deep tendon reflexes are symmetric.   Assessment/Plan:  1. Migraine headache  2. Diabetes  The patient has poorly controlled diabetes with a hemoglobin A1c of greater than 10. The patient is doing slightly better with her migraine, and the propranolol will be increased to 40 mg twice daily. The patient will watch out for increasing depression. The patient will be set up for Botox injections. The patient followup in 3 or 4 months.  Marlan Palau MD 01/01/2013 8:52 PM  [facility] [address] [place], [place] [address]  Phone [phone] Fax [phone]

## 2013-01-03 ENCOUNTER — Encounter (HOSPITAL_COMMUNITY): Payer: Self-pay | Admitting: Emergency Medicine

## 2013-01-03 ENCOUNTER — Emergency Department (HOSPITAL_COMMUNITY)
Admission: EM | Admit: 2013-01-03 | Discharge: 2013-01-05 | Disposition: A | Discharge status: Other Acute Inpatient Hospital | Payer: BC Managed Care – PPO | Attending: Emergency Medicine | Admitting: Emergency Medicine

## 2013-01-03 DIAGNOSIS — Z8669 Personal history of other diseases of the nervous system and sense organs: Secondary | ICD-10-CM | POA: Insufficient documentation

## 2013-01-03 DIAGNOSIS — Z794 Long term (current) use of insulin: Secondary | ICD-10-CM | POA: Insufficient documentation

## 2013-01-03 DIAGNOSIS — Z7982 Long term (current) use of aspirin: Secondary | ICD-10-CM | POA: Insufficient documentation

## 2013-01-03 DIAGNOSIS — E119 Type 2 diabetes mellitus without complications: Secondary | ICD-10-CM

## 2013-01-03 DIAGNOSIS — Z8742 Personal history of other diseases of the female genital tract: Secondary | ICD-10-CM | POA: Insufficient documentation

## 2013-01-03 DIAGNOSIS — R45851 Suicidal ideations: Secondary | ICD-10-CM

## 2013-01-03 DIAGNOSIS — Z79899 Other long term (current) drug therapy: Secondary | ICD-10-CM | POA: Insufficient documentation

## 2013-01-03 DIAGNOSIS — E1069 Type 1 diabetes mellitus with other specified complication: Secondary | ICD-10-CM | POA: Insufficient documentation

## 2013-01-03 DIAGNOSIS — Z3202 Encounter for pregnancy test, result negative: Secondary | ICD-10-CM | POA: Insufficient documentation

## 2013-01-03 DIAGNOSIS — G43019 Migraine without aura, intractable, without status migrainosus: Secondary | ICD-10-CM | POA: Insufficient documentation

## 2013-01-03 DIAGNOSIS — R739 Hyperglycemia, unspecified: Secondary | ICD-10-CM

## 2013-01-03 LAB — RAPID URINE DRUG SCREEN, HOSP PERFORMED
Barbiturates: NOT DETECTED
Benzodiazepines: NOT DETECTED
Cocaine: NOT DETECTED
Tetrahydrocannabinol: NOT DETECTED

## 2013-01-03 LAB — CBC WITH DIFFERENTIAL/PLATELET
Basophils Absolute: 0 10*3/uL (ref 0.0–0.1)
Basophils Relative: 0 % (ref 0–1)
HCT: 41 % (ref 36.0–46.0)
MCHC: 34.4 g/dL (ref 30.0–36.0)
Monocytes Absolute: 0.3 10*3/uL (ref 0.1–1.0)
Neutro Abs: 4.4 10*3/uL (ref 1.7–7.7)
Platelets: 230 10*3/uL (ref 150–400)
RDW: 12.8 % (ref 11.5–15.5)

## 2013-01-03 LAB — COMPREHENSIVE METABOLIC PANEL
AST: 12 U/L (ref 0–37)
Albumin: 3.3 g/dL — ABNORMAL LOW (ref 3.5–5.2)
Calcium: 9.4 mg/dL (ref 8.4–10.5)
Chloride: 98 mEq/L (ref 96–112)
Creatinine, Ser: 0.58 mg/dL (ref 0.50–1.10)
Total Bilirubin: 0.2 mg/dL — ABNORMAL LOW (ref 0.3–1.2)

## 2013-01-03 LAB — GLUCOSE, CAPILLARY: Glucose-Capillary: 175 mg/dL — ABNORMAL HIGH (ref 70–99)

## 2013-01-03 MED ORDER — CLONAZEPAM 0.5 MG PO TABS: 0.5000 mg | ORAL_TABLET | Freq: Three times a day (TID) | ORAL | Status: DC | PRN

## 2013-01-03 MED ORDER — PROPRANOLOL HCL 40 MG PO TABS
40.0000 mg | ORAL_TABLET | Freq: Two times a day (BID) | ORAL | Status: DC
  Administered 2013-01-03 – 2013-01-05 (×4): 40 mg via ORAL
  Filled 2013-01-03 (×5): qty 1

## 2013-01-03 MED ORDER — ZOLPIDEM TARTRATE 10 MG PO TABS: 10.0000 mg | ORAL_TABLET | Freq: Every evening | ORAL | Status: DC | PRN

## 2013-01-03 MED ORDER — ADULT MULTIVITAMIN W/MINERALS CH
1.0000 | ORAL_TABLET | Freq: Every day | ORAL | Status: DC
  Administered 2013-01-04 – 2013-01-05 (×2): 1 via ORAL
  Filled 2013-01-03 (×2): qty 1

## 2013-01-03 MED ORDER — INSULIN GLARGINE 100 UNIT/ML ~~LOC~~ SOLN
40.0000 [IU] | Freq: Every morning | SUBCUTANEOUS | Status: DC
  Administered 2013-01-04 – 2013-01-05 (×2): 40 [IU] via SUBCUTANEOUS
  Filled 2013-01-03 (×2): qty 0.4

## 2013-01-03 MED ORDER — INSULIN ASPART 100 UNIT/ML ~~LOC~~ SOLN
10.0000 [IU] | Freq: Once | SUBCUTANEOUS | Status: AC
  Administered 2013-01-03: 10 [IU] via SUBCUTANEOUS
  Filled 2013-01-03: qty 1

## 2013-01-03 MED ORDER — INSULIN ASPART 100 UNIT/ML ~~LOC~~ SOLN
0.0000 [IU] | Freq: Every day | SUBCUTANEOUS | Status: DC
  Administered 2013-01-04: 3 [IU] via SUBCUTANEOUS
  Filled 2013-01-03: qty 1

## 2013-01-03 MED ORDER — ZOLPIDEM TARTRATE 5 MG PO TABS
5.0000 mg | ORAL_TABLET | Freq: Every evening | ORAL | Status: DC | PRN
  Administered 2013-01-03 – 2013-01-04 (×2): 5 mg via ORAL
  Filled 2013-01-03 (×2): qty 1

## 2013-01-03 MED ORDER — INSULIN ASPART 100 UNIT/ML ~~LOC~~ SOLN
0.0000 [IU] | Freq: Three times a day (TID) | SUBCUTANEOUS | Status: DC
  Administered 2013-01-03: 19:00:00 via SUBCUTANEOUS
  Administered 2013-01-04: 8 [IU] via SUBCUTANEOUS
  Administered 2013-01-04: 5 [IU] via SUBCUTANEOUS
  Filled 2013-01-03 (×4): qty 1

## 2013-01-03 MED ORDER — LURASIDONE HCL 40 MG PO TABS
40.0000 mg | ORAL_TABLET | Freq: Every day | ORAL | Status: DC
  Administered 2013-01-03 – 2013-01-04 (×2): 40 mg via ORAL
  Filled 2013-01-03 (×3): qty 1

## 2013-01-03 NOTE — ED Notes (Signed)
Pt wanded by security. One belonging bag and one big black luggage bag at nurses station in triage.

## 2013-01-03 NOTE — ED Notes (Signed)
CBG registered 357 on ED Gulcometer

## 2013-01-03 NOTE — ED Notes (Signed)
Sleep medication effective; no s/s of distress noted.

## 2013-01-03 NOTE — ED Provider Notes (Signed)
10:47 PM ACT has seen patient, she suggests telepsych as there is no psych coverage here overnight tonight.  This has been ordered.   Ethelda Chick, MD 01/03/13 [phone]

## 2013-01-03 NOTE — ED Notes (Signed)
Pt belongings place in locker number 37. One pt belonging bag and one black luggage purse.

## 2013-01-03 NOTE — ED Provider Notes (Signed)
History    CSN: [id number] Arrival date & time 01/03/13  1250  First MD Initiated Contact with Patient 01/03/13 1343     Chief Complaint  Patient presents with  . Suicidal   (Consider location/radiation/quality/duration/timing/severity/associated sxs/prior Treatment) HPI Depressed for years now suicidal several days without a plan denies homicidal ideation or hallucinations, chronic baseline left lower quadrant abdominal pain unchanged for years. No treatment prior to arrival. Past Medical History  Diagnosis Date  . Ovarian cyst   . Depression   . Cluster headache syndrome, intractable   . Migraine without aura, with intractable migraine, so stated, without mention of status migrainosus 12/05/2012  . Type I diabetes mellitus    Past Surgical History  Procedure Laterality Date  . Appendectomy    . Cholecystectomy    . Ovarian cyst removal    . Bunionectomy Bilateral    Family History  Problem Relation Age of Onset  . Adopted: Yes   History  Substance Use Topics  . Smoking status: Never Smoker   . Smokeless tobacco: Never Used  . Alcohol Use: Yes     Comment: 12/05/2012 "glass of wine maybe once/month"   OB History   Grav Para Term Preterm Abortions TAB SAB Ect Mult Living                 Review of Systems 10 Systems reviewed and are negative for acute change except as noted in the HPI. Allergies  Sulfa antibiotics; Wellbutrin; Amoxicillin; and Codeine  Home Medications   Current Outpatient Rx  Name  Route  Sig  Dispense  Refill  . clonazePAM (KLONOPIN) 0.5 MG tablet   Oral   Take 0.5 mg by mouth 3 (three) times daily as needed for anxiety. For anxiety         . insulin aspart (NOVOLOG) 100 UNIT/ML injection   Subcutaneous   Inject 10-20 Units into the skin 3 (three) times daily. PT USES SLIDING SCALE.         . insulin glargine (LANTUS) 100 UNIT/ML injection   Subcutaneous   Inject 40 Units into the skin every morning.          . lurasidone (LATUDA)  40 MG TABS   Oral   Take 40 mg by mouth daily. With food         . Multiple Vitamin (MULTIVITAMIN WITH MINERALS) TABS   Oral   Take 1 tablet by mouth daily.         . ondansetron (ZOFRAN) 4 MG tablet   Oral   Take 1 tablet (4 mg total) by mouth every 8 (eight) hours as needed for nausea.   30 tablet   3   . promethazine (PHENERGAN) 25 MG tablet   Oral   Take 25 mg by mouth every 6 (six) hours as needed for nausea.         . propranolol (INDERAL) 40 MG tablet   Oral   Take 1 tablet (40 mg total) by mouth 2 (two) times daily.   60 tablet   5   . SUMAtriptan (IMITREX) 100 MG tablet   Oral   Take 1 tablet (100 mg total) by mouth every 2 (two) hours as needed for migraine.   20 tablet   0   . zolpidem (AMBIEN) 10 MG tablet   Oral   Take 10 mg by mouth at bedtime as needed for sleep. For sleep          BP 101/67  Pulse  90  Temp(Src) 98.6 F (37 C) (Oral)  Resp 20  SpO2 98%  LMP 12/14/2012 Physical Exam  Nursing note and vitals reviewed. Constitutional:  Awake, alert, nontoxic appearance.  HENT:  Head: Atraumatic.  Eyes: Right eye exhibits no discharge. Left eye exhibits no discharge.  Neck: Neck supple.  Cardiovascular: Normal rate and regular rhythm.   No murmur heard. Pulmonary/Chest: Effort normal and breath sounds normal. No respiratory distress. She has no wheezes. She has no rales. She exhibits no tenderness.  Abdominal: Soft. Bowel sounds are normal. She exhibits no distension and no mass. There is tenderness. There is no rebound and no guarding.  Minimally tender left lower quadrant patient states is baseline for her  Musculoskeletal: She exhibits no tenderness.  Baseline ROM, no obvious new focal weakness.  Neurological:  Mental status and motor strength appears baseline for patient and situation.  Skin: No rash noted.  Psychiatric:  Positive SI without HI/hallucinations    ED Course  Procedures (including critical care time) Patient  understand and agree with initial ED impression and plan with expectations set for ED visit including ACT and/or Psych team evaluation. Labs Reviewed  COMPREHENSIVE METABOLIC PANEL - Abnormal; Notable for the following:    Sodium 131 (*)    Glucose, Bld 396 (*)    Albumin 3.3 (*)    Total Bilirubin 0.2 (*)    All other components within normal limits  GLUCOSE, CAPILLARY - Abnormal; Notable for the following:    Glucose-Capillary 357 (*)    All other components within normal limits  GLUCOSE, CAPILLARY - Abnormal; Notable for the following:    Glucose-Capillary 175 (*)    All other components within normal limits  GLUCOSE, CAPILLARY - Abnormal; Notable for the following:    Glucose-Capillary 116 (*)    All other components within normal limits  CBC WITH DIFFERENTIAL  URINE RAPID DRUG SCREEN (HOSP PERFORMED)  ETHANOL  POCT PREGNANCY, URINE   No results found. 1. Suicidal ideation   2. Hyperglycemia without ketosis   3. Diabetes mellitus     MDM  Dispo pending.  Hurman Horn, MD 01/03/13 2216

## 2013-01-03 NOTE — ED Notes (Signed)
Pt currently asleep; no s/s of distress noted. Respirations regular and unlabored. 

## 2013-01-03 NOTE — ED Notes (Signed)
She states she has a problem with depression, and a persistent feeling that she would be better off if she could "just go away".  She denies any suicide plan, but states she simply wants this persistent thought pattern to resolve.

## 2013-01-04 LAB — GLUCOSE, CAPILLARY
Glucose-Capillary: 181 mg/dL — ABNORMAL HIGH (ref 70–99)
Glucose-Capillary: 206 mg/dL — ABNORMAL HIGH (ref 70–99)
Glucose-Capillary: 252 mg/dL — ABNORMAL HIGH (ref 70–99)
Glucose-Capillary: 264 mg/dL — ABNORMAL HIGH (ref 70–99)

## 2013-01-04 MED ORDER — ZOLPIDEM TARTRATE 10 MG PO TABS: 10.0000 mg | ORAL_TABLET | Freq: Every evening | ORAL | Status: DC | PRN

## 2013-01-04 MED ORDER — ZOLPIDEM TARTRATE 5 MG PO TABS
5.0000 mg | ORAL_TABLET | Freq: Once | ORAL | Status: AC
  Administered 2013-01-04: 5 mg via ORAL
  Filled 2013-01-04: qty 1

## 2013-01-04 NOTE — ED Notes (Addendum)
Visual acuity: Uncorrected vision: left eye covered line 4=20/50; right eye covered line 3=20/70.sbw,rn Per dr Felix Pacini request.

## 2013-01-04 NOTE — ED Provider Notes (Signed)
Patient seen and evaluated for increasing depression. Patient does not have active suicidal ideation or plan. Psych evaluation recommends discharge with community health followup.  Gilda Crease, MD 01/04/13 [phone]

## 2013-01-04 NOTE — ED Notes (Signed)
Advised md visual acuity in charted.

## 2013-01-04 NOTE — ED Notes (Signed)
Dr Jacobowitz  into see 
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
## 2013-01-04 NOTE — Consult Note (Signed)
Reason for Consult: Recurrent major depression Referring Physician:  Dr Claybon Jabs is an 37 y.o. female.  HPI: Patient is a 37 years old caucasian female who came in for suicidal thoughts with no plans and increased depressive mood.  Patient could not contract for safety this morning but states she does not have any plans.  She reports her Psychiatrist placed her on  Latuda for 3 months but she still  Feels very depressed and just found out Jordan is not for depression.  Patient reports her stressors are her inability to secure a better paying jobs, financial difficulties,and her inability to secure her own apartment  and sharing one with a friend.  Patient states she decided to come to the hospital because staying home may push her to hurt herself.  She reports similar episodes of intense suicidal thoughts when she had to go to [facility] for few days and another hospital in [place].  Patient exhibits flat affect with poor eye contact.  She reports feeling hopeless and helpless and  anhedonia staying home with out enjoying any kind of activity she used to enjoy.  She reports poor sleep and appetite and stated" just want to end it all"  She denies HI/AVH.  We will admit her to our inpatient unit for safety and stability and medication management.  Past Medical History  Diagnosis Date  . Ovarian cyst   . Depression   . Cluster headache syndrome, intractable   . Migraine without aura, with intractable migraine, so stated, without mention of status migrainosus 12/05/2012  . Type I diabetes mellitus     Past Surgical History  Procedure Laterality Date  . Appendectomy    . Cholecystectomy    . Ovarian cyst removal    . Bunionectomy Bilateral     Family History  Problem Relation Age of Onset  . Adopted: Yes    Social History:  reports that she has never smoked. She has never used smokeless tobacco. She reports that  drinks alcohol. She reports that she does not use illicit  drugs.  Allergies:  Allergies  Allergen Reactions  . Sulfa Antibiotics Nausea And Vomiting  . Wellbutrin (Bupropion) Other (See Comments)    "Makes me suicidal."  . Amoxicillin Rash  . Codeine Rash    Medications: I have reviewed the patient's current medications.  Results for orders placed during the hospital encounter of 01/03/13 (from the past 48 hour(s))  GLUCOSE, CAPILLARY     Status: Abnormal   Collection Time    01/03/13  1:20 PM      Result Value Range   Glucose-Capillary 357 (*) 70 - 99 mg/dL  URINE RAPID DRUG SCREEN (HOSP PERFORMED)     Status: None   Collection Time    01/03/13  1:23 PM      Result Value Range   Opiates NONE DETECTED  NONE DETECTED   Cocaine NONE DETECTED  NONE DETECTED   Benzodiazepines NONE DETECTED  NONE DETECTED   Amphetamines NONE DETECTED  NONE DETECTED   Tetrahydrocannabinol NONE DETECTED  NONE DETECTED   Barbiturates NONE DETECTED  NONE DETECTED   Comment:            DRUG SCREEN FOR MEDICAL PURPOSES     ONLY.  IF CONFIRMATION IS NEEDED     FOR ANY PURPOSE, NOTIFY LAB     WITHIN 5 DAYS.                LOWEST DETECTABLE LIMITS  FOR URINE DRUG SCREEN     Drug Class       Cutoff (ng/mL)     Amphetamine      1000     Barbiturate      200     Benzodiazepine   200     Tricyclics       300     Opiates          300     Cocaine          300     THC              50  POCT PREGNANCY, URINE     Status: None   Collection Time    01/03/13  1:34 PM      Result Value Range   Preg Test, Ur NEGATIVE  NEGATIVE   Comment:            THE SENSITIVITY OF THIS     METHODOLOGY IS >24 mIU/mL  CBC WITH DIFFERENTIAL     Status: None   Collection Time    01/03/13  3:05 PM      Result Value Range   WBC 6.8  4.0 - 10.5 K/uL   RBC 4.75  3.87 - 5.11 MIL/uL   Hemoglobin 14.1  12.0 - 15.0 g/dL   HCT 11.9  14.7 - 82.9 %   MCV 86.3  78.0 - 100.0 fL   MCH 29.7  26.0 - 34.0 pg   MCHC 34.4  30.0 - 36.0 g/dL   RDW 56.2  13.0 - 86.5 %   Platelets 230  150  - 400 K/uL   Neutrophils Relative % 64  43 - 77 %   Neutro Abs 4.4  1.7 - 7.7 K/uL   Lymphocytes Relative 29  12 - 46 %   Lymphs Abs 1.9  0.7 - 4.0 K/uL   Monocytes Relative 5  3 - 12 %   Monocytes Absolute 0.3  0.1 - 1.0 K/uL   Eosinophils Relative 2  0 - 5 %   Eosinophils Absolute 0.1  0.0 - 0.7 K/uL   Basophils Relative 0  0 - 1 %   Basophils Absolute 0.0  0.0 - 0.1 K/uL  COMPREHENSIVE METABOLIC PANEL     Status: Abnormal   Collection Time    01/03/13  3:05 PM      Result Value Range   Sodium 131 (*) 135 - 145 mEq/L   Potassium 4.1  3.5 - 5.1 mEq/L   Chloride 98  96 - 112 mEq/L   CO2 21  19 - 32 mEq/L   Glucose, Bld 396 (*) 70 - 99 mg/dL   BUN 10  6 - 23 mg/dL   Creatinine, Ser 7.84  0.50 - 1.10 mg/dL   Calcium 9.4  8.4 - 69.6 mg/dL   Total Protein 7.1  6.0 - 8.3 g/dL   Albumin 3.3 (*) 3.5 - 5.2 g/dL   AST 12  0 - 37 U/L   ALT 10  0 - 35 U/L   Alkaline Phosphatase 85  39 - 117 U/L   Total Bilirubin 0.2 (*) 0.3 - 1.2 mg/dL   GFR calc non Af Amer >90  >90 mL/min   GFR calc Af Amer >90  >90 mL/min   Comment:            The eGFR has been calculated     using the CKD EPI equation.     This calculation has  not been     validated in all clinical     situations.     eGFR's persistently     <90 mL/min signify     possible Chronic Kidney Disease.  ETHANOL     Status: None   Collection Time    01/03/13  3:05 PM      Result Value Range   Alcohol, Ethyl (B) <11  0 - 11 mg/dL   Comment:            LOWEST DETECTABLE LIMIT FOR     SERUM ALCOHOL IS 11 mg/dL     FOR MEDICAL PURPOSES ONLY  GLUCOSE, CAPILLARY     Status: Abnormal   Collection Time    01/03/13  6:14 PM      Result Value Range   Glucose-Capillary 175 (*) 70 - 99 mg/dL   Comment 1 Notify RN    GLUCOSE, CAPILLARY     Status: Abnormal   Collection Time    01/03/13  9:02 PM      Result Value Range   Glucose-Capillary 116 (*) 70 - 99 mg/dL  GLUCOSE, CAPILLARY     Status: Abnormal   Collection Time    01/04/13   6:17 AM      Result Value Range   Glucose-Capillary 181 (*) 70 - 99 mg/dL  GLUCOSE, CAPILLARY     Status: Abnormal   Collection Time    01/04/13  8:24 AM      Result Value Range   Glucose-Capillary 206 (*) 70 - 99 mg/dL    No results found.  Review of Systems  Constitutional: Negative.   HENT: Negative.   Eyes: Negative.   Respiratory: Negative.   Cardiovascular: Negative.   Gastrointestinal: Negative.   Genitourinary: Negative.   Musculoskeletal: Negative.   Skin: Negative.   Neurological: Negative for loss of consciousness.  Endo/Heme/Allergies: Negative.   Psychiatric/Behavioral: Positive for depression (Hx of depressuion.  Rates 8/10) and suicidal ideas (Have been feeling suicidal for abot 5 days now.  Unable to contract for safety.). Negative for hallucinations, memory loss and substance abuse. The patient is nervous/anxious (Rates anxiety 6/10.  Had a panic attack yesterday) and has insomnia (Poor sleep but Remus Loffler helps to fall asleep but wakes up middle of the night.).    Blood pressure 102/68, pulse 83, temperature 98.4 F (36.9 C), temperature source Oral, resp. rate 18, last menstrual period 12/14/2012, SpO2 98.00%. Physical Exam  Constitutional: She is oriented to person, place, and time. She appears well-developed and well-nourished. She appears distressed.  HENT:  Head: Normocephalic and atraumatic.  Eyes: Conjunctivae and EOM are normal. Right eye exhibits discharge. Left eye exhibits no discharge. No scleral icterus.  Neck: Normal range of motion. Neck supple. No JVD present. No tracheal deviation present. No thyromegaly present.  Cardiovascular: Normal rate and regular rhythm.   Respiratory: Effort normal and breath sounds normal. No respiratory distress. She has no wheezes. She has no rales. She exhibits no tenderness.  GI: Soft. Bowel sounds are normal.  Musculoskeletal: Normal range of motion. She exhibits no edema and no tenderness.  Lymphadenopathy:    She  has no cervical adenopathy.  Neurological: She is alert and oriented to person, place, and time. She has normal reflexes.  Skin: Skin is warm and dry. She is not diaphoretic.    Assessment/Plan:  Patient will be admitted to our inpatient unit for safety and stabilization. Antidepressant will be initiated for treatment of her depression.   She will be  encouraged to join group sessions while  In the unit.   [name], C   PMHNP-BC 01/04/2013, 9:36 AM

## 2013-01-04 NOTE — BHH Counselor (Addendum)
Dr. Rennis Chris is scheduling an opthalmology appointment for Pt and wants to be certain that Pt will be able to be transported from [facility] to attend appointment. Contacted Rosey Bath, administrative on-call at [facility], who said transportation can be arrange but the attending psychiatrist will need to approve that Pt is safe to leave the building to attend appointment. Communicated this information to Dr. Rennis Chris.  Harlin Rain Patsy Baltimore, LPC, [facility] Assessment Counselor

## 2013-01-04 NOTE — ED Provider Notes (Signed)
Patient states vision in left eye has worsened progressively since April 2014. She has had a bit of detachment in the right eye.. Patient has a scheduled appointment with retinal specialist for July 14. I spoke with Dr. Luciana Axe, ophthalmologist. He will see the patient in his office tomorrow at 1 PM for EYE evaluation. If she remains in inpatient here or at behavioral health hospital I been assured by behavioral health supervisor  AND BY FORD OF act TEAMthe patient did be transported to Dr. Ephriam Knuckles office.  Doug Sou, MD 01/04/13 1700

## 2013-01-04 NOTE — ED Notes (Signed)
RN made extender and psychiatrist that the pt stated she is having a "hemorrhage" in her left eye. Psych extender and md staff stated they would make the [facility] emergency md aware of this pt's problem.

## 2013-01-05 ENCOUNTER — Encounter (HOSPITAL_COMMUNITY): Payer: Self-pay | Admitting: Rehabilitation

## 2013-01-05 ENCOUNTER — Inpatient Hospital Stay (HOSPITAL_COMMUNITY)
Admission: AD | Admit: 2013-01-05 | Discharge: 2013-01-09 | DRG: 430 | Disposition: A | Discharge status: Home / Self Care | Payer: BC Managed Care – PPO | Source: Intra-hospital | Attending: Psychiatry | Admitting: Psychiatry

## 2013-01-05 DIAGNOSIS — G43019 Migraine without aura, intractable, without status migrainosus: Secondary | ICD-10-CM

## 2013-01-05 DIAGNOSIS — N83209 Unspecified ovarian cyst, unspecified side: Secondary | ICD-10-CM | POA: Diagnosis present

## 2013-01-05 DIAGNOSIS — F332 Major depressive disorder, recurrent severe without psychotic features: Principal | ICD-10-CM | POA: Diagnosis present

## 2013-01-05 DIAGNOSIS — Z598 Other problems related to housing and economic circumstances: Secondary | ICD-10-CM

## 2013-01-05 DIAGNOSIS — Z5987 Material hardship due to limited financial resources, not elsewhere classified: Secondary | ICD-10-CM

## 2013-01-05 DIAGNOSIS — F411 Generalized anxiety disorder: Secondary | ICD-10-CM

## 2013-01-05 DIAGNOSIS — F329 Major depressive disorder, single episode, unspecified: Secondary | ICD-10-CM

## 2013-01-05 DIAGNOSIS — R45851 Suicidal ideations: Secondary | ICD-10-CM

## 2013-01-05 DIAGNOSIS — G4489 Other headache syndrome: Secondary | ICD-10-CM | POA: Diagnosis present

## 2013-01-05 DIAGNOSIS — Z79899 Other long term (current) drug therapy: Secondary | ICD-10-CM

## 2013-01-05 DIAGNOSIS — H3589 Other specified retinal disorders: Secondary | ICD-10-CM | POA: Diagnosis present

## 2013-01-05 DIAGNOSIS — Z794 Long term (current) use of insulin: Secondary | ICD-10-CM

## 2013-01-05 DIAGNOSIS — E109 Type 1 diabetes mellitus without complications: Secondary | ICD-10-CM | POA: Diagnosis present

## 2013-01-05 LAB — GLUCOSE, CAPILLARY
Glucose-Capillary: 105 mg/dL — ABNORMAL HIGH (ref 70–99)
Glucose-Capillary: 232 mg/dL — ABNORMAL HIGH (ref 70–99)
Glucose-Capillary: 135 mg/dL — ABNORMAL HIGH (ref 70–99)

## 2013-01-05 MED ORDER — INSULIN ASPART 100 UNIT/ML ~~LOC~~ SOLN
1.0000 [IU] | Freq: Every day | SUBCUTANEOUS | Status: DC
  Administered 2013-01-05: 4 [IU] via SUBCUTANEOUS
  Administered 2013-01-06: 3 [IU] via SUBCUTANEOUS
  Administered 2013-01-06: 1 [IU] via SUBCUTANEOUS
  Administered 2013-01-06 – 2013-01-07 (×4): 2 [IU] via SUBCUTANEOUS
  Administered 2013-01-07: 1 [IU] via SUBCUTANEOUS
  Administered 2013-01-07: 18:00:00 via SUBCUTANEOUS
  Administered 2013-01-08 (×2): 1 [IU] via SUBCUTANEOUS
  Administered 2013-01-08 – 2013-01-09 (×3): 2 [IU] via SUBCUTANEOUS
  Administered 2013-01-09: 6 [IU] via SUBCUTANEOUS

## 2013-01-05 MED ORDER — PROPRANOLOL HCL 40 MG PO TABS
40.0000 mg | ORAL_TABLET | Freq: Two times a day (BID) | ORAL | Status: DC
  Administered 2013-01-05 – 2013-01-09 (×8): 40 mg via ORAL
  Filled 2013-01-05 (×7): qty 1
  Filled 2013-01-05: qty 4
  Filled 2013-01-05 (×4): qty 1
  Filled 2013-01-05: qty 4
  Filled 2013-01-05: qty 1

## 2013-01-05 MED ORDER — INSULIN ASPART 100 UNIT/ML ~~LOC~~ SOLN
1.0000 [IU] | Freq: Every day | SUBCUTANEOUS | Status: DC
  Administered 2013-01-05: 3 [IU] via SUBCUTANEOUS
  Filled 2013-01-05: qty 3

## 2013-01-05 MED ORDER — LURASIDONE HCL 40 MG PO TABS
40.0000 mg | ORAL_TABLET | Freq: Every day | ORAL | Status: DC
  Administered 2013-01-05 – 2013-01-08 (×4): 40 mg via ORAL
  Filled 2013-01-05 (×6): qty 1
  Filled 2013-01-05: qty 3

## 2013-01-05 MED ORDER — INSULIN ASPART 100 UNIT/ML ~~LOC~~ SOLN: 1.0000 [IU] | Freq: Three times a day (TID) | SUBCUTANEOUS | Status: DC

## 2013-01-05 MED ORDER — INSULIN GLARGINE 100 UNIT/ML ~~LOC~~ SOLN
40.0000 [IU] | Freq: Every morning | SUBCUTANEOUS | Status: DC
  Administered 2013-01-06 – 2013-01-09 (×4): 40 [IU] via SUBCUTANEOUS

## 2013-01-05 MED ORDER — CLONAZEPAM 0.5 MG PO TABS: 0.5000 mg | ORAL_TABLET | Freq: Three times a day (TID) | ORAL | Status: DC | PRN

## 2013-01-05 MED ORDER — ZOLPIDEM TARTRATE 5 MG PO TABS
5.0000 mg | ORAL_TABLET | Freq: Every evening | ORAL | Status: DC | PRN
  Administered 2013-01-05: 5 mg via ORAL
  Filled 2013-01-05: qty 1

## 2013-01-05 MED ORDER — ZOLPIDEM TARTRATE 5 MG PO TABS
5.0000 mg | ORAL_TABLET | Freq: Once | ORAL | Status: AC
  Administered 2013-01-06: 5 mg via ORAL
  Filled 2013-01-05: qty 1

## 2013-01-05 MED ORDER — ACETAMINOPHEN 325 MG PO TABS
650.0000 mg | ORAL_TABLET | Freq: Four times a day (QID) | ORAL | Status: DC | PRN
  Administered 2013-01-06 – 2013-01-08 (×3): 650 mg via ORAL

## 2013-01-05 MED ORDER — ADULT MULTIVITAMIN W/MINERALS CH
1.0000 | ORAL_TABLET | Freq: Every day | ORAL | Status: DC
  Administered 2013-01-06 – 2013-01-09 (×4): 1 via ORAL
  Filled 2013-01-05 (×6): qty 1

## 2013-01-05 MED ORDER — INSULIN ASPART 100 UNIT/ML ~~LOC~~ SOLN
1.0000 [IU] | Freq: Three times a day (TID) | SUBCUTANEOUS | Status: DC
  Administered 2013-01-05: 4 [IU] via SUBCUTANEOUS
  Administered 2013-01-06: 2.5 [IU] via SUBCUTANEOUS
  Administered 2013-01-06: 3 [IU] via SUBCUTANEOUS
  Administered 2013-01-07 – 2013-01-08 (×3): 4 [IU] via SUBCUTANEOUS
  Administered 2013-01-08: 2 [IU] via SUBCUTANEOUS
  Administered 2013-01-09 (×2): 1 [IU] via SUBCUTANEOUS

## 2013-01-05 MED ORDER — ALUM & MAG HYDROXIDE-SIMETH 200-200-20 MG/5ML PO SUSP: 30.0000 mL | ORAL | Status: DC | PRN

## 2013-01-05 MED ORDER — MAGNESIUM HYDROXIDE 400 MG/5ML PO SUSP: 30.0000 mL | Freq: Every day | ORAL | Status: DC | PRN

## 2013-01-05 MED ORDER — ZOLPIDEM TARTRATE 5 MG PO TABS: 5.0000 mg | ORAL_TABLET | Freq: Every day | ORAL | Status: DC

## 2013-01-05 NOTE — BHH Counselor (Signed)
Patient accepted to [facility] by Julieanne Cotton, NP to Dr. Elsie Saas. The room assignment is 506-2. Pt will not go to [facility] first. She will instead go to her ophthalmologist appt. first which is scheduled at 1pm today.  Pt will leave her ophthalmologist appointment and go straight to [facility] for her inpatient admission.

## 2013-01-05 NOTE — Progress Notes (Signed)
Miscellaneous Pharmacy Consult: Insulin Regimen  Asked by RN to interview patient regarding her insulin regimen.  Patient is Type 1 diabetic, and reports her most recent A1c was 10.2%.  She is followed by an endocrinologist at [facility] (Dr. Andrey Campanile).  She states she wants to stay on her exact home regimen since she has worked very hard to control her blood sugars.  Her current regimen is as follows:  1. Lantus 40 units sq q24h - takes at exactly 8am daily 2. Novolog meal coverage: 1 unit for every 12 grams carbohydrates consumed (tries to avoid eating >60 grams carbs at one sitting) 3. Novolog sliding scale anywhere from once to 6 times per day depending on activity level. Subtract 100 from measured blood sugar then divide by 35 to determine total amount of insulin to be given. This is in addition to the meal coverage regimen.  Pharmacy has updated the admission med rec and adjusted inpatient orders.  Loralee Pacas, PharmD, BCPS Pager: [phone] 01/05/2013 10:47 AM

## 2013-01-05 NOTE — BHH Counselor (Signed)
Patient going to an eye appointment prior to her admission to [facility]. Support paperwork will need to be signed once patient arrives to [facility]. Writer faxed over to [facility] the documents for patient to sign.

## 2013-01-05 NOTE — Progress Notes (Signed)
[facility] MD Progress Note  01/05/2013 11:51 AM [name]  MRN:  [id number] Subjective:  Patient is still c/o passive suicide and has a bed in out inpatient unit.  Patient is being admitted and will be moved after her ophthalmologist appointment this afternoon.  Patient states she is still stressed out with her uncontrolled blood sugar and that she need a new antidepressant.  We will admit for safety and stability and medication management. Diagnosis:   Axis I: Anxiety Disorder NOS and Depressive Disorder NOS Axis II: Deferred Axis III:  Past Medical History  Diagnosis Date  . Ovarian cyst   . Depression   . Cluster headache syndrome, intractable   . Migraine without aura, with intractable migraine, so stated, without mention of status migrainosus 12/05/2012  . Type I diabetes mellitus    Axis IV: housing problems, other psychosocial or environmental problems and problems related to social environment Axis V: 51-60 moderate symptoms  ADL's:  Intact  Sleep: Good  Appetite:  Good  Suicidal Ideation:  Plan:  none Intent:  vague, passive Means:  none Homicidal Ideation:  Plan:  none Intent:  none Means:  none AEB (as evidenced by):  Psychiatric Specialty Exam: Review of Systems  Constitutional: Negative.   HENT: Negative.   Eyes:       Patient is c/o of possible left eye bleed secondary to high blood sugar.  Has an Ophthalmologist appointment today.  Respiratory: Negative.   Cardiovascular: Negative.   Gastrointestinal: Negative.   Genitourinary: Negative.   Musculoskeletal: Negative.   Skin: Negative.   Neurological: Negative.   Endo/Heme/Allergies: Negative.   Psychiatric/Behavioral: Positive for depression (Still depressed, admitted for in patient treatment.) and suicidal ideas (Passive suicide no plans). Negative for hallucinations, memory loss and substance abuse. The patient is nervous/anxious (Anxious 8/10). The patient does not have insomnia.     Blood pressure  116/76, pulse 76, temperature 98.8 F (37.1 C), temperature source Oral, resp. rate 16, last menstrual period 12/14/2012, SpO2 98.00%.There is no weight on file to calculate BMI.  General Appearance: Casual  Eye Contact::  Fair  Speech:  Clear and Coherent  Volume:  Normal  Mood:  Angry, Anxious, Depressed and Hopeless  Affect:  Blunt, Depressed and Flat  Thought Process:  Coherent and Intact  Orientation:  Full (Time, Place, and Person)  Thought Content:  WDL  Suicidal Thoughts:  Yes.  without intent/plan  Homicidal Thoughts:  No  Memory:  Immediate;   Good Recent;   Good Remote;   Good  Judgement:  Intact  Insight:  Good  Psychomotor Activity:  Normal  Concentration:  Good  Recall:  Good  Akathisia:  NA  Handed:  Right  AIMS (if indicated):     Assets:  Desire for Improvement  Sleep:      Current Medications: Current Facility-Administered Medications  Medication Dose Route Frequency Provider Last Rate Last Dose  . clonazePAM (KLONOPIN) tablet 0.5 mg  0.5 mg Oral TID PRN Hurman Horn, MD      . insulin aspart (novoLOG) injection 1-10 Units  1-10 Units Subcutaneous TID WC Hurman Horn, MD      . insulin aspart (novoLOG) injection 1-20 Units  1-20 Units Subcutaneous 6 X Daily Hurman Horn, MD      . insulin glargine (LANTUS) injection 40 Units  40 Units Subcutaneous q morning - 10a Hurman Horn, MD   40 Units at 01/05/13 0950  . lurasidone (LATUDA) tablet 40 mg  40 mg  Oral Q supper Hurman Horn, MD   40 mg at 01/04/13 1809  . multivitamin with minerals tablet 1 tablet  1 tablet Oral Daily Hurman Horn, MD   1 tablet at 01/05/13 [phone]  . propranolol (INDERAL) tablet 40 mg  40 mg Oral BID Hurman Horn, MD   40 mg at 01/05/13 9604  . valproate (DEPACON) 1,500 mg in sodium chloride 0.9 % 100 mL IVPB  1,500 mg Intravenous Continuous York Spaniel, MD   1,500 mg at 12/05/12 1139  . zolpidem (AMBIEN) tablet 10 mg  10 mg Oral QHS PRN Richardean Canal, MD       Current Outpatient  Prescriptions  Medication Sig Dispense Refill  . clonazePAM (KLONOPIN) 0.5 MG tablet Take 0.5 mg by mouth 3 (three) times daily as needed for anxiety. For anxiety      . insulin aspart (NOVOLOG) 100 UNIT/ML injection Inject 1-10 Units into the skin 3 (three) times daily with meals. Uses sliding scale: 1 unit of Novolog for every 12 grams carbohydrate intake.      . insulin aspart (NOVOLOG) 100 UNIT/ML injection Inject 1-20 Units into the skin 6 (six) times daily. In addition to mealtime coverage. Pt subtracts 100 from measured blood sugar and divides by 35 to determine amount of insulin.      Marland Kitchen insulin glargine (LANTUS) 100 UNIT/ML injection Inject 40 Units into the skin daily before breakfast. Takes at 8am      . lurasidone (LATUDA) 40 MG TABS Take 40 mg by mouth daily. With food      . Multiple Vitamin (MULTIVITAMIN WITH MINERALS) TABS Take 1 tablet by mouth daily.      . ondansetron (ZOFRAN) 4 MG tablet Take 1 tablet (4 mg total) by mouth every 8 (eight) hours as needed for nausea.  30 tablet  3  . promethazine (PHENERGAN) 25 MG tablet Take 25 mg by mouth every 6 (six) hours as needed for nausea.      . propranolol (INDERAL) 40 MG tablet Take 1 tablet (40 mg total) by mouth 2 (two) times daily.  60 tablet  5  . SUMAtriptan (IMITREX) 100 MG tablet Take 1 tablet (100 mg total) by mouth every 2 (two) hours as needed for migraine.  20 tablet  0  . zolpidem (AMBIEN) 10 MG tablet Take 10 mg by mouth at bedtime as needed for sleep. For sleep        Lab Results:  Results for orders placed during the hospital encounter of 01/03/13 (from the past 48 hour(s))  GLUCOSE, CAPILLARY     Status: Abnormal   Collection Time    01/03/13  1:20 PM      Result Value Range   Glucose-Capillary 357 (*) 70 - 99 mg/dL  URINE RAPID DRUG SCREEN (HOSP PERFORMED)     Status: None   Collection Time    01/03/13  1:23 PM      Result Value Range   Opiates NONE DETECTED  NONE DETECTED   Cocaine NONE DETECTED  NONE  DETECTED   Benzodiazepines NONE DETECTED  NONE DETECTED   Amphetamines NONE DETECTED  NONE DETECTED   Tetrahydrocannabinol NONE DETECTED  NONE DETECTED   Barbiturates NONE DETECTED  NONE DETECTED   Comment:            DRUG SCREEN FOR MEDICAL PURPOSES     ONLY.  IF CONFIRMATION IS NEEDED     FOR ANY PURPOSE, NOTIFY LAB     WITHIN  5 DAYS.                LOWEST DETECTABLE LIMITS     FOR URINE DRUG SCREEN     Drug Class       Cutoff (ng/mL)     Amphetamine      1000     Barbiturate      200     Benzodiazepine   200     Tricyclics       300     Opiates          300     Cocaine          300     THC              50  POCT PREGNANCY, URINE     Status: None   Collection Time    01/03/13  1:34 PM      Result Value Range   Preg Test, Ur NEGATIVE  NEGATIVE   Comment:            THE SENSITIVITY OF THIS     METHODOLOGY IS >24 mIU/mL  CBC WITH DIFFERENTIAL     Status: None   Collection Time    01/03/13  3:05 PM      Result Value Range   WBC 6.8  4.0 - 10.5 K/uL   RBC 4.75  3.87 - 5.11 MIL/uL   Hemoglobin 14.1  12.0 - 15.0 g/dL   HCT 75.6  43.3 - 29.5 %   MCV 86.3  78.0 - 100.0 fL   MCH 29.7  26.0 - 34.0 pg   MCHC 34.4  30.0 - 36.0 g/dL   RDW 18.8  41.6 - 60.6 %   Platelets 230  150 - 400 K/uL   Neutrophils Relative % 64  43 - 77 %   Neutro Abs 4.4  1.7 - 7.7 K/uL   Lymphocytes Relative 29  12 - 46 %   Lymphs Abs 1.9  0.7 - 4.0 K/uL   Monocytes Relative 5  3 - 12 %   Monocytes Absolute 0.3  0.1 - 1.0 K/uL   Eosinophils Relative 2  0 - 5 %   Eosinophils Absolute 0.1  0.0 - 0.7 K/uL   Basophils Relative 0  0 - 1 %   Basophils Absolute 0.0  0.0 - 0.1 K/uL  COMPREHENSIVE METABOLIC PANEL     Status: Abnormal   Collection Time    01/03/13  3:05 PM      Result Value Range   Sodium 131 (*) 135 - 145 mEq/L   Potassium 4.1  3.5 - 5.1 mEq/L   Chloride 98  96 - 112 mEq/L   CO2 21  19 - 32 mEq/L   Glucose, Bld 396 (*) 70 - 99 mg/dL   BUN 10  6 - 23 mg/dL   Creatinine, Ser 3.01  0.50 -  1.10 mg/dL   Calcium 9.4  8.4 - 60.1 mg/dL   Total Protein 7.1  6.0 - 8.3 g/dL   Albumin 3.3 (*) 3.5 - 5.2 g/dL   AST 12  0 - 37 U/L   ALT 10  0 - 35 U/L   Alkaline Phosphatase 85  39 - 117 U/L   Total Bilirubin 0.2 (*) 0.3 - 1.2 mg/dL   GFR calc non Af Amer >90  >90 mL/min   GFR calc Af Amer >90  >90 mL/min   Comment:  The eGFR has been calculated     using the CKD EPI equation.     This calculation has not been     validated in all clinical     situations.     eGFR's persistently     <90 mL/min signify     possible Chronic Kidney Disease.  ETHANOL     Status: None   Collection Time    01/03/13  3:05 PM      Result Value Range   Alcohol, Ethyl (B) <11  0 - 11 mg/dL   Comment:            LOWEST DETECTABLE LIMIT FOR     SERUM ALCOHOL IS 11 mg/dL     FOR MEDICAL PURPOSES ONLY  GLUCOSE, CAPILLARY     Status: Abnormal   Collection Time    01/03/13  6:14 PM      Result Value Range   Glucose-Capillary 175 (*) 70 - 99 mg/dL   Comment 1 Notify RN    GLUCOSE, CAPILLARY     Status: Abnormal   Collection Time    01/03/13  9:02 PM      Result Value Range   Glucose-Capillary 116 (*) 70 - 99 mg/dL  GLUCOSE, CAPILLARY     Status: Abnormal   Collection Time    01/04/13  6:17 AM      Result Value Range   Glucose-Capillary 181 (*) 70 - 99 mg/dL  GLUCOSE, CAPILLARY     Status: Abnormal   Collection Time    01/04/13  8:24 AM      Result Value Range   Glucose-Capillary 206 (*) 70 - 99 mg/dL  GLUCOSE, CAPILLARY     Status: Abnormal   Collection Time    01/04/13  9:56 AM      Result Value Range   Glucose-Capillary 307 (*) 70 - 99 mg/dL  GLUCOSE, CAPILLARY     Status: Abnormal   Collection Time    01/04/13 12:55 PM      Result Value Range   Glucose-Capillary 252 (*) 70 - 99 mg/dL  GLUCOSE, CAPILLARY     Status: Abnormal   Collection Time    01/04/13  5:51 PM      Result Value Range   Glucose-Capillary 112 (*) 70 - 99 mg/dL  GLUCOSE, CAPILLARY     Status: Abnormal    Collection Time    01/04/13  9:11 PM      Result Value Range   Glucose-Capillary 308 (*) 70 - 99 mg/dL   Comment 1 Notify RN    GLUCOSE, CAPILLARY     Status: Abnormal   Collection Time    01/04/13  9:29 PM      Result Value Range   Glucose-Capillary 264 (*) 70 - 99 mg/dL  GLUCOSE, CAPILLARY     Status: Abnormal   Collection Time    01/05/13  8:12 AM      Result Value Range   Glucose-Capillary 105 (*) 70 - 99 mg/dL    Physical Findings: AIMS:  , ,  ,  ,    CIWA:    COWS:     Treatment Plan Summary: Daily contact with patient to assess and evaluate symptoms and progress in treatment Medication management  Plan:  Admit to our inpatient unit medication management, safety and stabilization.  Medical Decision Making Problem Points:  Established problem, stable/improving (1) Data Points:  Review and summation of old records (2)  I certify that inpatient services furnished  can reasonably be expected to improve the patient's condition.   Dahlia Byes, C   PMHNP-BC 01/05/2013, 11:51 AM I personally seen the patient agreed with the findings and involved in the treatment plan.

## 2013-01-06 ENCOUNTER — Encounter (HOSPITAL_COMMUNITY): Payer: Self-pay | Admitting: Psychiatry

## 2013-01-06 DIAGNOSIS — F332 Major depressive disorder, recurrent severe without psychotic features: Secondary | ICD-10-CM | POA: Diagnosis present

## 2013-01-06 DIAGNOSIS — F411 Generalized anxiety disorder: Secondary | ICD-10-CM | POA: Diagnosis present

## 2013-01-06 LAB — GLUCOSE, CAPILLARY
Glucose-Capillary: 140 mg/dL — ABNORMAL HIGH (ref 70–99)
Glucose-Capillary: 208 mg/dL — ABNORMAL HIGH (ref 70–99)
Glucose-Capillary: 116 mg/dL — ABNORMAL HIGH (ref 70–99)

## 2013-01-06 MED ORDER — ZOLPIDEM TARTRATE 10 MG PO TABS
10.0000 mg | ORAL_TABLET | Freq: Every day | ORAL | Status: DC
  Administered 2013-01-06 – 2013-01-08 (×3): 10 mg via ORAL
  Filled 2013-01-06 (×3): qty 1

## 2013-01-06 MED ORDER — DULOXETINE HCL 20 MG PO CPEP
20.0000 mg | ORAL_CAPSULE | Freq: Every day | ORAL | Status: DC
  Administered 2013-01-06 – 2013-01-09 (×4): 20 mg via ORAL
  Filled 2013-01-06 (×3): qty 1
  Filled 2013-01-06: qty 3
  Filled 2013-01-06 (×2): qty 1

## 2013-01-06 MED ORDER — INSULIN ASPART 100 UNIT/ML ~~LOC~~ SOLN
1.0000 [IU] | Freq: Three times a day (TID) | SUBCUTANEOUS | Status: DC | PRN
  Administered 2013-01-06 – 2013-01-07 (×2): 1 [IU] via SUBCUTANEOUS

## 2013-01-06 NOTE — BHH Group Notes (Signed)
[facility] LCSW Aftercare Discharge Planning Group Note   01/06/2013 8:45 AM  Participation Quality:  Alert and Appropriate   Mood/Affect:  Appropriate, Flat and Depressed  Depression Rating:  6  Anxiety Rating:  4  Thoughts of Suicide:  Pt denies SI/HI  Will you contract for safety?   Yes  Current AVH:  Pt denies  Plan for Discharge/Comments:  Pt attended discharge planning group and actively participated in group.  CSW provided pt with today's workbook.  Pt states that she came to the hospital due to stress, depression, anxiety and SI.  Pt states that she lives in [place] with a friend, but this is a stressful environment and temporary.  Pt states that she goes to [facility] for medication management and therapy, but refuses to go back for medication management and will need a referral for a new psychiatrist.  CSW confirmed pt's therapy appointment with [facility].  No further needs voiced by pt at this time.    Transportation Means: Pt reports access to transportation  Supports: No supports mentioned at this time  [name], LCSWA 01/06/2013 10:29 AM

## 2013-01-06 NOTE — Progress Notes (Signed)
Recreation Therapy Notes  Date: 07.08.2014 Time: 2:45pm Location: [address] Dayroom      Group Topic/Focus: Musician (AAA/T)  Participation Level: Did not attend  [employer], LRT/CTRS  Maddox Hlavaty L 01/06/2013 3:52 PM

## 2013-01-06 NOTE — BHH Group Notes (Signed)
BHH LCSW Group Therapy  01/06/2013  1:15 PM   Type of Therapy:  Group Therapy  Participation Level:  Active  Participation Quality:  Appropriate and Attentive  Affect:  Appropriate, Flat and Depressed  Cognitive:  Alert and Appropriate  Insight:  Developing/Improving and Engaged  Engagement in Therapy:  Developing/Improving and Engaged  Modes of Intervention:  Clarification, Confrontation, Discussion, Education, Exploration, Limit-setting, Orientation, Problem-solving, Rapport Building, Dance movement psychotherapist, Socialization and Support  Summary of Progress/Problems: The topic for group therapy was feelings about diagnosis.  Pt actively participated in group discussion on their past and current diagnosis and how they feel towards this.  Pt also identified how society and family members judge them, based on their diagnosis as well as stereotypes and stigmas.   Pt shared that her diagnosis is depression, but has no thoughts on it.  Pt than shared that for her it is just something else to deal with.  Pt appeared to be actively listening to group discussion but minimally participated.    Esau Fridman Horton, LCSWA 01/06/2013 2:00 PM

## 2013-01-06 NOTE — Progress Notes (Signed)
0239 CBG of 66 Pt reported that her blood sugar may be low. She reported the symptom of shakiness.  Cbg of 66 was obtained. Per pt she treats herself with a light snack and some juice at home for blood sugars within this range. Pt selected a pack of goldfish and some grape juice. On call provider was contacted with the suggestion of the snack administration to be carried out for pt's cbg. Instructed writer to recheck blood sugar within a hour or prior to pt falling asleep.

## 2013-01-06 NOTE — BHH Group Notes (Signed)
Adult Psychoeducational Group Note  Date:  01/06/2013 Time:  10:38 PM  Group Topic/Focus:  Wrap-Up Group:   The focus of this group is to help patients review their daily goal of treatment and discuss progress on daily workbooks.  Participation Level:  Active  Participation Quality:  Appropriate  Affect:  Appropriate  Cognitive:  Appropriate  Insight: Appropriate  Engagement in Group:  Engaged  Modes of Intervention:  Discussion  Additional Comments:  Shaana stated that she had an okay day. She was started on new medicines and she had a goal to get through the day.  Caroll Rancher A 01/06/2013, 10:38 PM

## 2013-01-06 NOTE — Progress Notes (Addendum)
D:  Patient's self inventory sheet, patient has poor sleep, good appetite, low energy level, good attention span.  Rated depression and hopelessness #7.  Denied withdrawals.  Denied SI.  Has experienced bleeding in left eye, surgery scheduled next week.  Zero pain goal, worst pain #2.  After discharge, plans to find a new job.  No questions for staff.  No problems taking meds after discharge. A:  Medications administered per MD orders.  Emotional support and encouragement given patient. R:  Patient denied SI and HI.   Denied A/V hallucinations.  Will continue to monitor patient for safety with 15 minute checks.  Safety maintained.  MD appointment yesterday.  Plans to have left eye surgery next week.   NP planned to talk to diabetes coordinator about this patient.  0800  This morning patient's CBG was 140.  Stated she did not eat any carbs at breakfast.  140 equals 1 unit novolog which patient gave to herself.  Also lantus 40 units given to patient scheduled per MD orders. 1000  CBG was 134.  Formula is less 100, equals 34 divided by 35, which is 0.971.  Nurse talked to pharmacist and also patient, no insulin given per patient's instructions.  1245  Patient's CBG was 116.  No units of novolog given before lunch.  After lunch patient stated she ate 30 carbs.  Novolog 2.5 units given patient for carb intake. 1530  Patient's CBG was 208, patient stated she was going to eat snack.  3 unit novolog given for 208 and 1 unit novolog given for snack.  Total of 4 units novolog given to patient for afternoon snack total.  Discussed with two NP's and pharmacist.   1830  CBG was 165, minus 100 = 65 divided by 35 equals 1.85 rounded off to 2 units novolog.  Pt ate 35 carbs divided by 12 = 3 units novolog.  Total of 2 and 3 = 5 units novolog which was given to patient per MD orders.

## 2013-01-06 NOTE — BHH Counselor (Signed)
Adult Comprehensive Assessment  Patient ID: [name], female   DOB: [birth year], 37 y.o.   MRN: [id number]  Information Source: Information source: Patient  Current Stressors:  Educational / Learning stressors: N/A Employment / Job issues: Employed but poor income Family Relationships: N/A Surveyor, quantity / Lack of resources (include bankruptcy): finances are tight Housing / Lack of housing: temporarily living with a friend, stressful environment Physical health (include injuries & life threatening diseases): eye is bleeding, need surgery on it Social relationships: N/A Substance abuse: N/A Bereavement / Loss: N/A  Living/Environment/Situation:  Living Arrangements: Non-relatives/Friends Living conditions (as described by patient or guardian): Pt temporarily living with friend in [place].  Pt states that it is a stressful environment due to crowded home. How long has patient lived in current situation?: 3 months What is atmosphere in current home: Temporary  Family History:  Marital status: Single Does patient have children?: No  Childhood History:  By whom was/is the patient raised?: Both parents Additional childhood history information: Pt states that she had a normal childhood. Description of patient's relationship with caregiver when they were a child: Pt states that she got along okay with parents growing up. Patient's description of current relationship with people who raised him/her: Pt states that her mom is deceased and decent relationship with father.   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 Currently a student?: No Name of school: N/A Learning disability?: No  Employment/Work Situation:   Employment situation: Employed Where is patient currently employed?: [employer] How long has patient been employed?: 4 months Patient's job has been impacted by current illness: Yes Describe how patient's job has been impacted: job is stressful because income is commission base only What is the longest time patient has a held a job?: 3 years Where was the patient employed at that time?: Teacher Has patient ever been in the [employer]?: No Has patient ever served in combat?: No  Financial Resources:   Financial resources: Income from [employer] insurance Does patient have a representative payee or guardian?: No  Alcohol/Substance Abuse:   What has been your use of drugs/alcohol within the last 12 months?: Pt denies alcohol and drug use If attempted suicide, did drugs/alcohol play a role in this?: No Alcohol/Substance Abuse Treatment Hx: Denies past history If yes, describe treatment: N/A Has alcohol/substance abuse ever caused legal problems?: No  Social Support System:   Patient's Community Support System: Good Describe Community Support System: Pt states that her friend and dad are supportive Type of faith/religion: None reported How does patient's faith help to cope with current illness?: N/A  Leisure/Recreation:   Leisure and Hobbies: watching movies and listening to music  Strengths/Needs:   What things does the patient do well?: Pt can't name anything right.   In what areas does patient struggle / problems for patient: Depression, anxiety and SI  Discharge Plan:   Does patient have access to transportation?: Yes Will patient be returning to same living situation after discharge?: Yes Currently receiving community mental health services: Yes (From Whom)  ([facility] for therapy, need  med mgt referral) If no, would patient like referral for services when discharged?: Yes (What county?) Medical sales representative) Does patient have financial barriers related to discharge medications?: No  Summary/Recommendations:     Patient is a 37 year old Caucasian Female with a diagnosis of Major Depressive Disorder.  Patient lives in [place] with a friend temporarily.  Pt states that she has been stressed out about finances, staying with her friend (stressful environment) and eye pain (upcoming eye surgery).  Patient will benefit from crisis stabilization, medication evaluation, group therapy and psycho education in addition to case management for discharge planning.    Horton, Salome Arnt. 01/06/2013

## 2013-01-06 NOTE — BHH Suicide Risk Assessment (Signed)
[facility] Adult Inpatient Family/Significant Other Suicide Prevention Education  Suicide Prevention Education:   Patient Refusal for Family/Significant Other Suicide Prevention Education: The patient has refused to provide written consent for family/significant other to be provided Family/Significant Other Suicide Prevention Education during admission and/or prior to discharge.  Physician notified.  CSW provided suicide prevention information with patient.    The suicide prevention education provided includes the following:  Suicide risk factors  Suicide prevention and interventions  National Suicide Hotline telephone number  [facility] assessment telephone number  [facility] Emergency Assistance 911  [facility] and/or Residential Mobile Crisis Unit telephone number   [name], [place] 01/06/2013 9:17 AM

## 2013-01-06 NOTE — Progress Notes (Addendum)
D: Pt is flat in affect and depressed in mood. She relates some of her depression to her recent news about her left eye. Pt is up for surgery on July 14 th for reported hemorrhaging activity within her eye cavity.  Pt did not attend group this evening. She reported the desire to rest and receive her bedtime meds at 2100. Pt's CBG was 135 tonight and required 1 unit of insulin. She reports an intake of 7 carbs for her snack. She refused any insulin to be administered. Pt was not present within the milieu this evening. She only reported for med pass. She is currently denying any SI/HI.  A: Writer administered prn medications to pt for insomnia. Continued support and availability as needed was extended to this pt. Staff continue to monitor pt with q38min checks.  R: No adverse drug reactions noted. Pt receptive to treatment. Pt remains safe at this time.

## 2013-01-06 NOTE — Progress Notes (Signed)
D: Patient in the dayroom watching television on approach.  Patient appears flat and depressed.  Patient states she is worried about her upcoming surgery to her left eye.  Patient states she is anxious about her surgery.  Patient states she felt her blood sugar was low.  Patient blood sugar was 64 at 2006 tonight.  Patient given juice and peanut butter.  Patient blood sugar came up to 116 tonight.  Patient stated she also ate a bedtime snack and no bedtime insulin was given.  Patient denies SI/HI and denies AVH. A: Staff to monitor Q 15 mins for safety.  Encouragement and support offered.  Scheduled medications administered per orders. R: Patient remains safe on the unit.  Patient attended group tonight.  Patient cooperative and taking administered medications.  Patient visible on the unit tonight.

## 2013-01-06 NOTE — [facility] Suicide Risk Assessment (Signed)
Suicide Risk Assessment  Admission Assessment     Nursing information obtained from:    Demographic factors:    Current Mental Status:    Loss Factors:    Historical Factors:    Risk Reduction Factors:     CLINICAL FACTORS:   Depression:   Hopelessness Insomnia Severe  COGNITIVE FEATURES THAT CONTRIBUTE TO RISK:  Closed-mindedness Polarized thinking Thought constriction (tunnel vision)    SUICIDE RISK:   Moderate:  Frequent suicidal ideation with limited intensity, and duration, some specificity in terms of plans, no associated intent, good self-control, limited dysphoria/symptomatology, some risk factors present, and identifiable protective factors, including available and accessible social support.  PLAN OF CARE: Supportive approach/coping skills                               Continue Lutuda (R/O Bipolar Depression)                                Trial with Cymbalta 20 mg daily  I certify that inpatient services furnished can reasonably be expected to improve the patient's condition.  [name] A 01/06/2013, 12:46 PM
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
## 2013-01-06 NOTE — Progress Notes (Signed)
Adult Psychoeducational Group Note  Date:  01/06/2013 Time:  1:22 PM  Group Topic/Focus:  Recovery Goals:   The focus of this group is to identify appropriate goals for recovery and establish a plan to achieve them.  Participation Level:  None  Participation Quality:  Inattentive  Affect:  Flat and Irritable  Cognitive:  Appropriate  Insight: None  Engagement in Group:  None  Modes of Intervention:  Discussion  Additional Comments:  Pt refused to interact while attending group. Pt appeared tired with eyes closed.   Sharyn Lull 01/06/2013, 1:22 PM

## 2013-01-07 DIAGNOSIS — F332 Major depressive disorder, recurrent severe without psychotic features: Principal | ICD-10-CM

## 2013-01-07 LAB — GLUCOSE, CAPILLARY
Glucose-Capillary: 154 mg/dL — ABNORMAL HIGH (ref 70–99)
Glucose-Capillary: 120 mg/dL — ABNORMAL HIGH (ref 70–99)
Glucose-Capillary: 177 mg/dL — ABNORMAL HIGH (ref 70–99)

## 2013-01-07 NOTE — Tx Team (Signed)
Interdisciplinary Treatment Plan Update   Date Reviewed:  01/07/2013  Time Reviewed:  9:40 AM  Progress in Treatment:   Attending groups: Yes Participating in groups: Yes Taking medication as prescribed: Yes  Tolerating medication: Yes Family/Significant other contact made: Yes, contact made with family  Patient understands diagnosis: Yes  Discussing patient identified problems/goals with staff: Yes Medical problems stabilized or resolved: Yes Denies suicidal/homicidal ideation: Yes Patient has not harmed self or others: Yes  For review of initial/current patient goals, please see plan of care.  Estimated Length of Stay:  2-3 days  Reasons for Continued Hospitalization:  Anxiety Depression Medication stabilization   New Problems/Goals identified:    Discharge Plan or Barriers:   Home with outpatient follow up  Additional Comments: N/A  Attendees:  Patient:  01/07/2013 9:40 AM   Signature: Mervyn Gay, MD 01/07/2013 9:40 AM  Signature: 01/07/2013 9:40 AM  Signature: Harold Barban, RN 01/07/2013 9:40 AM  Signature:Beverly Terrilee Croak, RN 01/07/2013 9:40 AM  Signature:  Neill Loft RN 01/07/2013 9:40 AM  Signature:  Juline Patch, LCSW 01/07/2013 9:40 AM  Signature:  Reyes Ivan, LCSW 01/07/2013 9:40 AM  Signature:  Sharin Grave Coordinator 01/07/2013 9:40 AM  Signature: Fransisca Kaufmann, [facility] 01/07/2013 9:40 AM  Signature:    Signature:    Signature:      Scribe for Treatment Team:   Juline Patch,  01/07/2013 9:40 AM

## 2013-01-07 NOTE — Progress Notes (Addendum)
0900  Patient's CBG this morning 186, minus 100 = 86 divided by 35 = 2.457.  Patient stated she did not eat any carbs for breakfast.  Novolog 2 units given to patient this morning.  Also lantus 40 units given to patient per MD order.   0945  Patient's CBG was 154 minus 100 = 54 divided by 35 = 1.5428 = 2 units novolog.  Patient ate 14 carbs divided by 12 = 1 unit novolog.  Total of 3 units novolog given patient per pt/MD orders.     D:  Patient's self inventory sheet, patient has poor sleep, good appetite, low energy level, improving attention span.  Rated depression #5, hopelessness #7, anxiety #4.  Denied withdrawals.  Denied SI.  Believes her left eye is bleeding, annoying mostly, "feels like blob".  Zero pain goal, worst pain #2.  After discharge, plans to relax more.  Slept wrong, right shoulder uncomfortable, declined pain medication. Does have discharge plans to live with friend.  No problems taking meds after discharge. A:  Medications administered per MD orders.  Emotional support and encouragement given patient. R:  Denied SI and HI.  Denied A/V hallucinations.  Will continue to monitor patient for safety with 15 minute checks.  Safety maintained.

## 2013-01-07 NOTE — Progress Notes (Signed)
Recreation Therapy Notes  Date: 07.09.2014 Time: 3:00pm Location: [address] Dayroom  Group Topic/Focus: Problem Solving, Communication, Team Work  Participation Level:  Active  Participation Quality:  Appropriate  Affect:  Euthymic  Cognitive:  Appropriate  Additional Comments: Activity: Launching Pad; Explanation: Patients were given the following supplies: 5 rubber bands, 10 paper clips, 1 paper cup, 3 cardboard tubes, 3 index cards and a length of masking tape. Using the supplies provided patients were asked build a contraption to launch a ping pong ball across the room. Patients worked in teams of 5.   Patient participated in group activity. Patient offered suggestions to team members for building contraption. Patient worked well with peers. Patient listened to wrap up discussion, but did not contribute.   Marykay Lex Chae Shuster, LRT/CTRS  Edison Wollschlager L 01/07/2013 4:09 PM

## 2013-01-07 NOTE — Progress Notes (Signed)
Adult Psychoeducational Group Note  Date:  01/07/2013 Time:  11:06 PM  Group Topic/Focus:  Goals Group:   The focus of this group is to help patients establish daily goals to achieve during treatment and discuss how the patient can incorporate goal setting into their daily lives to aide in recovery.  Participation Level:  Active  Participation Quality:  Appropriate  Affect:  Appropriate  Cognitive:  Appropriate  Insight: Appropriate  Engagement in Group:  Engaged  Modes of Intervention:  Discussion  Additional Comments:  Pt  Stated that she had a good day  Aldona Lento 01/07/2013, 11:06 PM

## 2013-01-07 NOTE — [facility] Group Notes (Signed)
[facility] LCSW Group Therapy  Emotional Regulation 1:15 - 2: 30 PM        01/07/2013  1:03 PM   Type of Therapy:  Group Therapy  Participation Level:  Appropriate  Participation Quality:  Appropriate  Affect:  Appropriate  Cognitive:  Attentive Appropriate  Insight:  Developing/Improving Engaged  Engagement in Therapy:  Developing/Improving Engaged  Modes of Intervention:  Discussion Exploration Problem-Solving Supportive  Summary of Progress/Problems:  Group topic was emotional regulations.  Patient participated in the discussion and was able to identify fear as an emotion that needed to regulated. Patient shared she fears due to medical problems that she will not continue to be able to work.  She stating she has severe migraines and has lost job as a result.  Patient was encouraged to contact vocational rehab regarding career paths that would allow her to work from home or on a part-time basis.    Wynn Banker 01/07/2013 1:03 PM

## 2013-01-07 NOTE — [facility] Group Notes (Signed)
[facility] LCSW Aftercare Discharge Planning Group Note   01/07/2013 1:00 PM  Participation Quality:  Appropriate  Mood/Affect:  Appropriate, Depressed and Flat  Depression Rating:  5  Anxiety Rating:  4  Thoughts of Suicide:  No  Will you contract for safety?   NA  Current AVH:  No  Plan for Discharge/Comments:  Patient attending discharge planning group and actively participated in group.  CSW provided all participants with daily workbook. She advised of having financial stressor and experiencing SI for a few days prior to admission.  Patient is followed for outpatient services by [facility].  She has home and access to [facility].  Transportation Means:  Patient has transportation.   Supports:  Patient has a good support system.   Laniece Hornbaker, Joesph July

## 2013-01-07 NOTE — Progress Notes (Signed)
D: Pt was not hungry & did not want a bedtime snack. Pt refused any HS insulin coverage d/t not eating a snack. CBG was 142.

## 2013-01-07 NOTE — Progress Notes (Signed)
Adult Psychoeducational Group Note  Date:  01/07/2013 Time:  1:23 PM  Group Topic/Focus:  Crisis Planning:   The purpose of this group is to help patients create a crisis plan for use upon discharge or in the future, as needed.  Participation Level:  Active  Participation Quality:  Appropriate and Sharing  Affect:  Appropriate  Cognitive:  Appropriate  Insight: Appropriate  Engagement in Group:  Engaged  Modes of Intervention:  Discussion  Additional Comments:  Pt was appropriate and sharing while attending group. Pt shared that job stress and depression are two triggers for him. Pt plans to find a new job and plan for vacation once discharged.   Sharyn Lull 01/07/2013, 1:23 PM

## 2013-01-07 NOTE — Progress Notes (Signed)
[facility] MD Progress Note  01/07/2013 4:05 PM [name]  MRN:  [id number] Subjective:   Patient is observed attending groups on the unit. She appears severely depressed. Rates her depression at five today. Denies SI. Patient reports being worried that "I don't know the MD who will be doing my surgery. The MD that did my right eye was in [place]. I don't have access to the internet to look up information about him." Patient remains very stressed about her health and possibility of losing her job then insurance. Endorse passive SI due to these stressors but does not report having a plan.   Diagnosis:   Axis I: Major Depression, Recurrent severe Axis II: Deferred Axis III:  Past Medical History  Diagnosis Date  . Ovarian cyst   . Depression   . Cluster headache syndrome, intractable   . Migraine without aura, with intractable migraine, so stated, without mention of status migrainosus 12/05/2012  . Type I diabetes mellitus    Axis IV: economic problems, educational problems, occupational problems and other psychosocial or environmental problems Axis V: 41-50 serious symptoms  ADL's:  Intact  Sleep: Fair  Appetite:  Fair  Suicidal Ideation:  Passive SI no plan Homicidal Ideation:  Denies AEB (as evidenced by):  Psychiatric Specialty Exam: Review of Systems  Constitutional: Negative.   HENT: Negative.   Eyes: Positive for blurred vision, photophobia and pain.  Respiratory: Negative.   Cardiovascular: Negative.   Gastrointestinal: Negative.   Genitourinary: Negative.   Musculoskeletal: Negative.   Skin: Negative.   Neurological: Negative.   Endo/Heme/Allergies: Negative.   Psychiatric/Behavioral: Positive for depression. Negative for suicidal ideas, hallucinations, memory loss and substance abuse. The patient is nervous/anxious and has insomnia.     Blood pressure 122/85, pulse 86, temperature 98.2 F (36.8 C), temperature source Oral, resp. rate 18, height 5\' 2"  (1.575 m),  weight 76.658 kg (169 lb), last menstrual period 12/14/2012, SpO2 99.00%.Body mass index is 30.9 kg/(m^2).  General Appearance: Casual and Fairly Groomed  Eye Contact::  Patient wears sunglasses due to eye problems.   Speech:  Clear and Coherent and Slow  Volume:  Decreased  Mood:  Depressed and Dysphoric  Affect:  Flat  Thought Process:  Goal Directed and Intact  Orientation:  Full (Time, Place, and Person)  Thought Content:  WDL  Suicidal Thoughts:  No  Homicidal Thoughts:  No  Memory:  Immediate;   Good Recent;   Good Remote;   Good  Judgement:  Fair  Insight:  Present  Psychomotor Activity:  Normal  Concentration:  Fair  Recall:  Good  Akathisia:  No  Handed:  Right  AIMS (if indicated):     Assets:  Communication Skills Desire for Improvement Leisure Time Resilience Social Support Vocational/Educational  Sleep:  Number of Hours: 6.75   Current Medications: Current Facility-Administered Medications  Medication Dose Route Frequency Provider Last Rate Last Dose  . acetaminophen (TYLENOL) tablet 650 mg  650 mg Oral Q6H PRN Earney Navy, NP   650 mg at 01/06/13 1210  . alum & mag hydroxide-simeth (MAALOX/MYLANTA) 200-200-20 MG/5ML suspension 30 mL  30 mL Oral Q4H PRN Earney Navy, NP      . clonazePAM (KLONOPIN) tablet 0.5 mg  0.5 mg Oral TID PRN Earney Navy, NP      . DULoxetine (CYMBALTA) DR capsule 20 mg  20 mg Oral Daily Fransisca Kaufmann, NP   20 mg at 01/07/13 0746  . insulin aspart (novoLOG) injection 1-10 Units  1-10 Units Subcutaneous TID WC Nehemiah Settle, MD   4 Units at 01/07/13 1230  . insulin aspart (novoLOG) injection 1-20 Units  1-20 Units Subcutaneous 6 X Daily Earney Navy, NP   2 Units at 01/07/13 1507  . insulin aspart (novoLOG) injection 1-5 Units  1-5 Units Subcutaneous TID PRN Fransisca Kaufmann, NP   1 Units at 01/07/13 0954  . insulin glargine (LANTUS) injection 40 Units  40 Units Subcutaneous q morning - 10a Earney Navy,  NP   40 Units at 01/07/13 0743  . lurasidone (LATUDA) tablet 40 mg  40 mg Oral Q supper Earney Navy, NP   40 mg at 01/06/13 1724  . magnesium hydroxide (MILK OF MAGNESIA) suspension 30 mL  30 mL Oral Daily PRN Earney Navy, NP      . multivitamin with minerals tablet 1 tablet  1 tablet Oral Daily Earney Navy, NP   1 tablet at 01/07/13 0746  . propranolol (INDERAL) tablet 40 mg  40 mg Oral BID Earney Navy, NP   40 mg at 01/07/13 0746  . zolpidem (AMBIEN) tablet 10 mg  10 mg Oral QHS Kerry Hough, PA-C   10 mg at 01/06/13 2100    Lab Results:  Results for orders placed during the hospital encounter of 01/05/13 (from the past 48 hour(s))  GLUCOSE, CAPILLARY     Status: Abnormal   Collection Time    01/05/13  5:59 PM      Result Value Range   Glucose-Capillary 232 (*) 70 - 99 mg/dL  GLUCOSE, CAPILLARY     Status: Abnormal   Collection Time    01/05/13  8:45 PM      Result Value Range   Glucose-Capillary 135 (*) 70 - 99 mg/dL   Comment 1 Notify RN    GLUCOSE, CAPILLARY     Status: Abnormal   Collection Time    01/06/13  2:39 AM      Result Value Range   Glucose-Capillary 66 (*) 70 - 99 mg/dL   Comment 1 Notify RN    GLUCOSE, CAPILLARY     Status: Abnormal   Collection Time    01/06/13  3:06 AM      Result Value Range   Glucose-Capillary 109 (*) 70 - 99 mg/dL  GLUCOSE, CAPILLARY     Status: Abnormal   Collection Time    01/06/13  6:08 AM      Result Value Range   Glucose-Capillary 140 (*) 70 - 99 mg/dL  GLUCOSE, CAPILLARY     Status: Abnormal   Collection Time    01/06/13  9:20 AM      Result Value Range   Glucose-Capillary 134 (*) 70 - 99 mg/dL  GLUCOSE, CAPILLARY     Status: Abnormal   Collection Time    01/06/13 11:56 AM      Result Value Range   Glucose-Capillary 116 (*) 70 - 99 mg/dL  GLUCOSE, CAPILLARY     Status: Abnormal   Collection Time    01/06/13  3:12 PM      Result Value Range   Glucose-Capillary 208 (*) 70 - 99 mg/dL  GLUCOSE,  CAPILLARY     Status: Abnormal   Collection Time    01/06/13  5:09 PM      Result Value Range   Glucose-Capillary 165 (*) 70 - 99 mg/dL  GLUCOSE, CAPILLARY     Status: Abnormal   Collection Time    01/06/13  8:06 PM      Result Value Range   Glucose-Capillary 64 (*) 70 - 99 mg/dL  GLUCOSE, CAPILLARY     Status: Abnormal   Collection Time    01/06/13  8:45 PM      Result Value Range   Glucose-Capillary 116 (*) 70 - 99 mg/dL  GLUCOSE, CAPILLARY     Status: Abnormal   Collection Time    01/07/13  6:07 AM      Result Value Range   Glucose-Capillary 186 (*) 70 - 99 mg/dL  GLUCOSE, CAPILLARY     Status: Abnormal   Collection Time    01/07/13  9:35 AM      Result Value Range   Glucose-Capillary 154 (*) 70 - 99 mg/dL  GLUCOSE, CAPILLARY     Status: Abnormal   Collection Time    01/07/13 11:55 AM      Result Value Range   Glucose-Capillary 120 (*) 70 - 99 mg/dL  GLUCOSE, CAPILLARY     Status: Abnormal   Collection Time    01/07/13  2:54 PM      Result Value Range   Glucose-Capillary 177 (*) 70 - 99 mg/dL    Physical Findings: AIMS: Facial and Oral Movements Muscles of Facial Expression: None, normal Lips and Perioral Area: None, normal Jaw: None, normal Tongue: None, normal,Extremity Movements Upper (arms, wrists, hands, fingers): None, normal Lower (legs, knees, ankles, toes): None, normal, Trunk Movements Neck, shoulders, hips: None, normal, Overall Severity Severity of abnormal movements (highest score from questions above): None, normal Incapacitation due to abnormal movements: None, normal Patient's awareness of abnormal movements (rate only patient's report): No Awareness, Dental Status Current problems with teeth and/or dentures?: No Does patient usually wear dentures?: No  CIWA:  CIWA-Ar Total: 1 COWS:  COWS Total Score: 2  Treatment Plan Summary: Daily contact with patient to assess and evaluate symptoms and progress in treatment Medication  management  Plan: Continue crisis management and stabilization.  Medication management: Reviewed with patient who stated no untoward effects. Continue Cymbalta as ordered.  Encouraged patient to attend groups and participate in group counseling sessions and activities.  Discharge plan in progress.  Address health issues: Vitals reviewed and stable. Patient has upcoming surgery on left eye next week on Wednesday. Monitoring CBG's daily. Diabetic nurse was consulted and is following patient daily.  Continue current treatment plan.   Medical Decision Making Problem Points:  Established problem, stable/improving (1) and Review of psycho-social stressors (1) Data Points:  Review of medication regiment & side effects (2)  I certify that inpatient services furnished can reasonably be expected to improve the patient's condition.   Myiah Petkus NP-C 01/07/2013, 4:05 PM

## 2013-01-08 LAB — GLUCOSE, CAPILLARY
Glucose-Capillary: 75 mg/dL (ref 70–99)
Glucose-Capillary: 161 mg/dL — ABNORMAL HIGH (ref 70–99)
Glucose-Capillary: 138 mg/dL — ABNORMAL HIGH (ref 70–99)
Glucose-Capillary: 100 mg/dL — ABNORMAL HIGH (ref 70–99)

## 2013-01-08 MED ORDER — SUMATRIPTAN SUCCINATE 50 MG PO TABS
50.0000 mg | ORAL_TABLET | ORAL | Status: DC | PRN
  Administered 2013-01-08 (×2): 50 mg via ORAL
  Filled 2013-01-08 (×2): qty 1

## 2013-01-08 NOTE — Progress Notes (Signed)
Adult Psychoeducational Group Note  Date:  01/08/2013 Time:  2:59 PM  Group Topic/Focus:  Overcoming Stress:   The focus of this group is to define stress and help patients assess their triggers.  Participation Level:  Minimal  Participation Quality:  Attentive  Affect:  Appropriate, Blunted and Depressed  Cognitive:  Alert and Appropriate  Insight: Good  Engagement in Group:  Resistant  Modes of Intervention:  Discussion, Education, Exploration, Orientation and Support  Additional Comments:  Pt sat on the chair attentive but did not join in the discussion unless called on. She stated being with friends was a stress reliever.  Reynolds Bowl 01/08/2013, 2:59 PM

## 2013-01-08 NOTE — Progress Notes (Signed)
D: Patient resting in bed with eyes closed.  Respirations even and unlabored.  Patient appears to be in no apparent distress. A: Staff to monitor Q 15 mins for safety.   R:Patient remains safe on the unit.  

## 2013-01-08 NOTE — Plan of Care (Signed)
Problem: Alteration in mood Goal: LTG-Pt's behavior demonstrates decreased signs of depression Patient is rating depression at five. Goal is for patient to rate depression at four or below prior to discharge.   Candice Porteous Candice Provencio, LCSW 01/07/2013 Outcome: Completed/Met Date Met:  01/08/13 Patient is rating depression at two.  She has attended groups and easily engaged in discussions.  Outpatient follow up is scheduled with [facility]. Candice Mehlman, LCSW 01/08/2013

## 2013-01-08 NOTE — Progress Notes (Addendum)
0830 Patient's CBG was 134 minus 100 = 34 divided by 35 = 0.9714 = 1 unit novolog given to patient Ptstated she ate 20 carbs divided by 12 = 1.666 = 2 units novolg = total of 3 units novolog sq given to pt per MD orders. Also Lantus 40 units sq given to pt. 0900  Patient's CBG 160 minus 100 = 60 divided by 35 = 1.714 = 2 units novolog given to pt per MD orders 1200  Patient's CBG 75, no insulin to be given. Patient ate 50 carbs divided by 12 = 4.1666 = 4 units novolog which was given to pt per MD orders. 1500   CBG 161 minus 100 = 61 divided by 35 = 1.742 = 2 units novolog given to pt per MD orders.  No carbs or prn insulin needed per pt's instructions and MD orders. 1700  CBG 138 minus 100 = 38 divided by 35 = 1.08 unit novolog = 2 units novolog given pt No carbs eaten, no insulin given to cover carbs.  D:  Patient's self inventory sheet, patient has poor sleep, good appetite, normal energy level, good attention span.  Rated depression and hopelessness #2.  Denied withdrawals.  Denied SI.  Still experiencing bleeding left eye, surgery next week.  Denied pain, zero pain goal.  Plans to relax more.  No questions for staff.  Does have discharge plans.  No problems taking meds after discharge. A: Medications administered per MD orders.  Emotional support and encouragement given patient. R:  Denied SI and HI.  Denied A/V hallucinations.  Will continue to monitor for safety with 15 minute checks.  Safety maintained.  Patient complained of headache #10.  Imitrex order and given at 1610.  Headache decreased to #7.  Imitrex given again at 1812, headache continued at #7.  Patient went to dining room, ate approximately 75%.   Patient given ginger ale for nausea.

## 2013-01-08 NOTE — [facility] Group Notes (Signed)
[facility] LCSW Aftercare Discharge Planning Group Note   01/08/2013 11:01 AM  Participation Quality:  Appropriate  Mood/Affect:  Appropriate  Depression Rating:  2  Anxiety Rating:  0  Thoughts of Suicide:  No  Will you contract for safety?   NA  Current AVH:  No  Plan for Discharge/Comments:  Patient attending discharge planning group and actively participated in group.  CSW provided all participants with daily workbook. She reports doing much better today and is hopeful to discharge home soon.  Transportation Means:  Patient has transportation.   Supports:  Patient has a good support system.   [name], Joesph July

## 2013-01-08 NOTE — Progress Notes (Signed)
Patient ID: [name], female   DOB: 09-15-1975, 37 y.o.   MRN: [id number]  D: Pt denies SI/HI/AVH. Pt is pleasant and cooperative. Pt spent most of the night in bed, did not go to [employer]. Pt, minimal interaction on unit due to HA.  A: Pt was offered support and encouragement. Pt was given scheduled medications. Pt was encourage to attend groups. Q 15 minute checks were done for safety.   R: Pt is taking medication. Pt receptive to treatment and safety maintained on unit.

## 2013-01-08 NOTE — Progress Notes (Signed)
Patient ID: [name], female   DOB: February 03, 1976, 37 y.o.   MRN: [id number] [facility] MD Progress Note  01/08/2013 3:14 PM [name]  MRN:  [id number] Subjective:   [name] reports today that she is ready to face her eye surgery and get through it. She reports having trouble sleeping in the hospital and is anxious to get home. Denies SI. Rates her depression at one. Patient feels the combination of being started on a new medication and going to groups has really helped boost her mood. Patient stated "I think I needed to step back from my stressors and reevaluate things. I have a new perspective."   Diagnosis:   Axis I: Major Depression, Recurrent severe Axis II: Deferred Axis III:  Past Medical History  Diagnosis Date  . Ovarian cyst   . Depression   . Cluster headache syndrome, intractable   . Migraine without aura, with intractable migraine, so stated, without mention of status migrainosus 12/05/2012  . Type I diabetes mellitus    Axis IV: economic problems, educational problems, occupational problems and other psychosocial or environmental problems Axis V: 41-50 serious symptoms  ADL's:  Intact  Sleep: Fair  Appetite:  Fair  Suicidal Ideation:  Denies Homicidal Ideation:  Denies AEB (as evidenced by):  Psychiatric Specialty Exam: Review of Systems  Constitutional: Negative.   HENT: Negative.   Eyes: Positive for blurred vision, photophobia and pain.  Respiratory: Negative.   Cardiovascular: Negative.   Gastrointestinal: Negative.   Genitourinary: Negative.   Musculoskeletal: Negative.   Skin: Negative.   Neurological: Negative.   Endo/Heme/Allergies: Negative.   Psychiatric/Behavioral: Positive for depression. Negative for suicidal ideas, hallucinations, memory loss and substance abuse. The patient is nervous/anxious and has insomnia.     Blood pressure 121/81, pulse 87, temperature 98.1 F (36.7 C), temperature source Oral, resp. rate 16, height 5\' 2"  (1.575 m),  weight 76.658 kg (169 lb), last menstrual period 12/14/2012, SpO2 99.00%.Body mass index is 30.9 kg/(m^2).  General Appearance: Casual and Fairly Groomed  Eye Contact::  Patient wears sunglasses due to eye problems.   Speech:  Clear and Coherent and Slow  Volume:  Decreased  Mood:  Depressed and Dysphoric  Affect:  Flat  Thought Process:  Goal Directed and Intact  Orientation:  Full (Time, Place, and Person)  Thought Content:  WDL  Suicidal Thoughts:  No  Homicidal Thoughts:  No  Memory:  Immediate;   Good Recent;   Good Remote;   Good  Judgement:  Fair  Insight:  Present  Psychomotor Activity:  Normal  Concentration:  Fair  Recall:  Good  Akathisia:  No  Handed:  Right  AIMS (if indicated):     Assets:  Communication Skills Desire for Improvement Leisure Time Resilience Social Support Vocational/Educational  Sleep:  Number of Hours: 6.25   Current Medications: Current Facility-Administered Medications  Medication Dose Route Frequency Provider Last Rate Last Dose  . acetaminophen (TYLENOL) tablet 650 mg  650 mg Oral Q6H PRN Earney Navy, NP   650 mg at 01/08/13 1206  . alum & mag hydroxide-simeth (MAALOX/MYLANTA) 200-200-20 MG/5ML suspension 30 mL  30 mL Oral Q4H PRN Earney Navy, NP      . clonazePAM (KLONOPIN) tablet 0.5 mg  0.5 mg Oral TID PRN Earney Navy, NP      . DULoxetine (CYMBALTA) DR capsule 20 mg  20 mg Oral Daily Fransisca Kaufmann, NP   20 mg at 01/08/13 0749  . insulin aspart (novoLOG) injection  1-10 Units  1-10 Units Subcutaneous TID WC Nehemiah Settle, MD   4 Units at 01/08/13 1300  . insulin aspart (novoLOG) injection 1-20 Units  1-20 Units Subcutaneous 6 X Daily Earney Navy, NP   2 Units at 01/08/13 1435  . insulin aspart (novoLOG) injection 1-5 Units  1-5 Units Subcutaneous TID PRN Fransisca Kaufmann, NP   1 Units at 01/07/13 0954  . insulin glargine (LANTUS) injection 40 Units  40 Units Subcutaneous q morning - 10a Earney Navy,  NP   40 Units at 01/08/13 0741  . lurasidone (LATUDA) tablet 40 mg  40 mg Oral Q supper Earney Navy, NP   40 mg at 01/07/13 1725  . magnesium hydroxide (MILK OF MAGNESIA) suspension 30 mL  30 mL Oral Daily PRN Earney Navy, NP      . multivitamin with minerals tablet 1 tablet  1 tablet Oral Daily Earney Navy, NP   1 tablet at 01/08/13 0749  . propranolol (INDERAL) tablet 40 mg  40 mg Oral BID Earney Navy, NP   40 mg at 01/08/13 0749  . zolpidem (AMBIEN) tablet 10 mg  10 mg Oral QHS Kerry Hough, PA-C   10 mg at 01/07/13 2113    Lab Results:  Results for orders placed during the hospital encounter of 01/05/13 (from the past 48 hour(s))  GLUCOSE, CAPILLARY     Status: Abnormal   Collection Time    01/06/13  5:09 PM      Result Value Range   Glucose-Capillary 165 (*) 70 - 99 mg/dL  GLUCOSE, CAPILLARY     Status: Abnormal   Collection Time    01/06/13  8:06 PM      Result Value Range   Glucose-Capillary 64 (*) 70 - 99 mg/dL  GLUCOSE, CAPILLARY     Status: Abnormal   Collection Time    01/06/13  8:45 PM      Result Value Range   Glucose-Capillary 116 (*) 70 - 99 mg/dL  GLUCOSE, CAPILLARY     Status: Abnormal   Collection Time    01/07/13  6:07 AM      Result Value Range   Glucose-Capillary 186 (*) 70 - 99 mg/dL  GLUCOSE, CAPILLARY     Status: Abnormal   Collection Time    01/07/13  9:35 AM      Result Value Range   Glucose-Capillary 154 (*) 70 - 99 mg/dL  GLUCOSE, CAPILLARY     Status: Abnormal   Collection Time    01/07/13 11:55 AM      Result Value Range   Glucose-Capillary 120 (*) 70 - 99 mg/dL  GLUCOSE, CAPILLARY     Status: Abnormal   Collection Time    01/07/13  2:54 PM      Result Value Range   Glucose-Capillary 177 (*) 70 - 99 mg/dL  GLUCOSE, CAPILLARY     Status: Abnormal   Collection Time    01/07/13  4:44 PM      Result Value Range   Glucose-Capillary 177 (*) 70 - 99 mg/dL   Comment 1 Notify RN    GLUCOSE, CAPILLARY     Status:  Abnormal   Collection Time    01/07/13  9:04 PM      Result Value Range   Glucose-Capillary 142 (*) 70 - 99 mg/dL  GLUCOSE, CAPILLARY     Status: None   Collection Time    01/08/13  2:40 AM  Result Value Range   Glucose-Capillary 73  70 - 99 mg/dL  GLUCOSE, CAPILLARY     Status: Abnormal   Collection Time    01/08/13  6:14 AM      Result Value Range   Glucose-Capillary 134 (*) 70 - 99 mg/dL  GLUCOSE, CAPILLARY     Status: Abnormal   Collection Time    01/08/13  9:04 AM      Result Value Range   Glucose-Capillary 160 (*) 70 - 99 mg/dL  GLUCOSE, CAPILLARY     Status: None   Collection Time    01/08/13 11:46 AM      Result Value Range   Glucose-Capillary 75  70 - 99 mg/dL  GLUCOSE, CAPILLARY     Status: Abnormal   Collection Time    01/08/13  2:29 PM      Result Value Range   Glucose-Capillary 161 (*) 70 - 99 mg/dL    Physical Findings: AIMS: Facial and Oral Movements Muscles of Facial Expression: None, normal Lips and Perioral Area: None, normal Jaw: None, normal Tongue: None, normal,Extremity Movements Upper (arms, wrists, hands, fingers): None, normal Lower (legs, knees, ankles, toes): None, normal, Trunk Movements Neck, shoulders, hips: None, normal, Overall Severity Severity of abnormal movements (highest score from questions above): None, normal Incapacitation due to abnormal movements: None, normal Patient's awareness of abnormal movements (rate only patient's report): No Awareness, Dental Status Current problems with teeth and/or dentures?: No Does patient usually wear dentures?: No  CIWA:  CIWA-Ar Total: 5 COWS:  COWS Total Score: 2  Treatment Plan Summary: Daily contact with patient to assess and evaluate symptoms and progress in treatment Medication management  Plan: Continue crisis management and stabilization.  Medication management: Reviewed with patient who stated no untoward effects. Continue Cymbalta as ordered.  Encouraged patient to attend  groups and participate in group counseling sessions and activities.  Discharge plan in progress. Anticipate d/c tomorrow.  Address health issues: Vitals reviewed and stable. Patient has upcoming surgery on left eye next week on Wednesday. Monitoring CBG's daily. Diabetic nurse was consulted and is following patient daily.  Continue current treatment plan.   Medical Decision Making Problem Points:  Established problem, stable/improving (1) and Review of psycho-social stressors (1) Data Points:  Review of medication regiment & side effects (2)  I certify that inpatient services furnished can reasonably be expected to improve the patient's condition.   Dahlia Nifong NP-C 01/08/2013, 3:14 PM

## 2013-01-08 NOTE — [facility] Group Notes (Signed)
[facility] LCSW Group Therapy  01/08/2013  1:15 PM   Type of Therapy:  Group Therapy  Participation Level:  Active  Participation Quality:  Appropriate and Attentive  Affect:  Appropriate, Flat and Depressed  Cognitive:  Alert and Appropriate  Insight:  Developing/Improving and Engaged  Engagement in Therapy:  Developing/Improving and Engaged  Modes of Intervention:  Activity, Clarification, Confrontation, Discussion, Education, Exploration, Limit-setting, Orientation, Problem-solving, Rapport Building, Reality Testing, Socialization and Support  Summary of Progress/Problems: Patient was attentive and engaged with speaker from [employer].  Patient expressed interest in their programs and services and received information on their agency.  Patient processed ways they can relate to the speaker.     Leland Raver Horton, LCSWA 01/08/2013 1:37 PM   

## 2013-01-09 DIAGNOSIS — F411 Generalized anxiety disorder: Secondary | ICD-10-CM

## 2013-01-09 LAB — GLUCOSE, CAPILLARY: Glucose-Capillary: 128 mg/dL — ABNORMAL HIGH (ref 70–99)

## 2013-01-09 MED ORDER — LURASIDONE HCL 40 MG PO TABS: 40.0000 mg | ORAL_TABLET | Freq: Every day | ORAL | Status: DC

## 2013-01-09 MED ORDER — CLONAZEPAM 0.5 MG PO TABS: 0.5000 mg | ORAL_TABLET | Freq: Three times a day (TID) | ORAL | Status: DC | PRN

## 2013-01-09 MED ORDER — ZOLPIDEM TARTRATE 10 MG PO TABS: 10.0000 mg | ORAL_TABLET | Freq: Every evening | ORAL | Status: DC | PRN

## 2013-01-09 MED ORDER — INSULIN ASPART 100 UNIT/ML ~~LOC~~ SOLN: 1.0000 [IU] | Freq: Every day | SUBCUTANEOUS | Status: DC

## 2013-01-09 MED ORDER — DULOXETINE HCL 20 MG PO CPEP: 20.0000 mg | ORAL_CAPSULE | Freq: Every day | ORAL | Status: DC

## 2013-01-09 MED ORDER — SUMATRIPTAN SUCCINATE 100 MG PO TABS: 100.0000 mg | ORAL_TABLET | ORAL | Status: DC | PRN

## 2013-01-09 MED ORDER — ADULT MULTIVITAMIN W/MINERALS CH: 1.0000 | ORAL_TABLET | Freq: Every day | ORAL | Status: DC

## 2013-01-09 MED ORDER — PROPRANOLOL HCL 40 MG PO TABS: 40.0000 mg | ORAL_TABLET | Freq: Two times a day (BID) | ORAL | Status: DC

## 2013-01-09 MED ORDER — INSULIN GLARGINE 100 UNIT/ML ~~LOC~~ SOLN: 40.0000 [IU] | Freq: Every day | SUBCUTANEOUS | Status: DC

## 2013-01-09 NOTE — Progress Notes (Signed)
Patient ID: [name], female   DOB: April 20, 1976, 37 y.o.   MRN: [id number] Patient denies si/hi/avh. Pt verbalizes understanding of discharge instructions and got prescriptions, sample medications and follow up appts.she got a copy of her AVS. Pt signed for their belongings from locker. Pt was walked to the lobby and was transported by hospital security to wedl to her car in the parking lot there.

## 2013-01-09 NOTE — Discharge Summary (Signed)
Physician Discharge Summary Note  Patient:  [name] is an 37 y.o., female MRN:  [id number] DOB:  [birth year] Patient phone:  [phone] (home)  Patient address:   [address] [place] [address]   Date of Admission:  01/05/2013 Date of Discharge: 01/09/13  Discharge Diagnoses: Active Problems:   Recurrent major depression-severe   Generalized anxiety disorder  Axis Diagnosis:  AXIS I: Anxiety Disorder NOS and Major Depression, Recurrent severe  AXIS II: Deferred  AXIS III:  Past Medical History   Diagnosis  Date   .  Ovarian cyst    .  Depression    .  Cluster headache syndrome, intractable    .  Migraine without aura, with intractable migraine, so stated, without mention of status migrainosus  12/05/2012   .  Type I diabetes mellitus     AXIS IV: other psychosocial or environmental problems  AXIS V: 61-70 mild symptoms  Level of Care:  OP  Hospital Course:   [name] is an 37 y.o. female. Pt presents voluntarily to [facility] with request for change in psych meds. She endorses passive SI. She says that since 01/01/13 she has been thinking "If I wasn't here, I wouldn't have to deal with all stress I have". Pt denies hx of suicide attempts. She was inpatient at [facility] and in [place] in past few years and states her "depressive feelings were a lot worse then" as opposed to tonight. She endorses hopelessness, tearfulness, fatigue, loss of interest and insomnia. She denies HI and [facility]. No delusions noted. Pt is adopted and doesn't know anything about her birth family. She sees Dr. Lars Mage for med management but doesn't have another appt scheduled for 6 weeks. Current stressors are increased depression and working at a job ([employer]) where she makes little money.  While a patient in this hospital, [name] was enrolled in group counseling and activities as well as received the following medication Current facility-administered medications:acetaminophen (TYLENOL) tablet 650  mg, 650 mg, Oral, Q6H PRN, Earney Navy, NP, 650 mg at 01/08/13 2111;  alum & mag hydroxide-simeth (MAALOX/MYLANTA) 200-200-20 MG/5ML suspension 30 mL, 30 mL, Oral, Q4H PRN, Earney Navy, NP;  clonazePAM (KLONOPIN) tablet 0.5 mg, 0.5 mg, Oral, TID PRN, Earney Navy, NP DULoxetine (CYMBALTA) DR capsule 20 mg, 20 mg, Oral, Daily, Fransisca Kaufmann, NP, 20 mg at 01/09/13 0805;  insulin aspart (novoLOG) injection 1-10 Units, 1-10 Units, Subcutaneous, TID WC, Nehemiah Settle, MD, 1 Units at 01/09/13 0814;  insulin aspart (novoLOG) injection 1-20 Units, 1-20 Units, Subcutaneous, 6 X Daily, Earney Navy, NP, 2 Units at 01/09/13 0815 insulin aspart (novoLOG) injection 1-5 Units, 1-5 Units, Subcutaneous, TID PRN, Fransisca Kaufmann, NP, 1 Units at 01/07/13 0954;  insulin glargine (LANTUS) injection 40 Units, 40 Units, Subcutaneous, q morning - 10a, Earney Navy, NP, 40 Units at 01/09/13 0816;  lurasidone (LATUDA) tablet 40 mg, 40 mg, Oral, Q supper, Earney Navy, NP, 40 mg at 01/08/13 1735 magnesium hydroxide (MILK OF MAGNESIA) suspension 30 mL, 30 mL, Oral, Daily PRN, Earney Navy, NP;  multivitamin with minerals tablet 1 tablet, 1 tablet, Oral, Daily, Earney Navy, NP, 1 tablet at 01/09/13 0805;  propranolol (INDERAL) tablet 40 mg, 40 mg, Oral, BID, Earney Navy, NP, 40 mg at 01/09/13 0805;  SUMAtriptan (IMITREX) tablet 50 mg, 50 mg, Oral, Q2H PRN, Fransisca Kaufmann, NP, 50 mg at 01/08/13 1811 zolpidem (AMBIEN) tablet 10 mg, 10 mg, Oral, QHS, Spencer E  Simon, PA-C, 10 mg at 01/08/13 2220 Patient reported being very worried about an upcoming eye surgery to reattach 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 She had become increasingly stressed due to medical and financial problems. The patient was started on Cymbalta to help with pain and depression. Her Latuda 40 was continued from prior to admission to continue with improved mood stability. Her diabetes was managed during her stay and  the diabetic RN was consulted due to patient's history of Type 1 Diabetes. The patient responded to support from staff and from going to the scheduled groups. She began to verbalize feeling ready to face her surgery and her other stressors.  Patient attended treatment team meeting this am and met with treatment team members. Pt symptoms, treatment plan and response to treatment discussed. [name] endorsed that their symptoms have improved. Pt also stated that they are stable for discharge.  In other to control Active Problems:   Recurrent major depression-severe   Generalized anxiety disorder , they will continue psychiatric care on outpatient basis. They will follow-up at  Follow-up Information   Follow up with [facility] On 01/15/2013. (Appointment scheduled at 1:00 pm with Breck Coons for therapy)    Contact information:   [address] [place], [place]   [address] Phone: [phone] Fax: [phone]    .  In addition they were instructed to take all your medications as prescribed by your mental healthcare provider, to report any adverse effects and or reactions from your medicines to your outpatient provider promptly, patient is instructed and cautioned to not engage in alcohol and or illegal drug use while on prescription medicines, in the event of worsening symptoms, patient is instructed to call the crisis hotline, 911 and or go to the nearest ED for appropriate evaluation and treatment of symptoms.   Upon discharge, patient adamantly denies suicidal, homicidal ideations, auditory, visual hallucinations and or delusional thinking. They left [facility] with all personal belongings in no apparent distress.  Consults:  See electronic record for details  Significant Diagnostic Studies:  See electronic record for details  Discharge Vitals:   Blood pressure 129/80, pulse 82, temperature 98.8 F (37.1 C), temperature source Oral, resp. rate 16, height 5\' 2"  (1.575 m), weight  76.658 kg (169 lb), last menstrual period 12/14/2012, SpO2 99.00%..  Mental Status Exam: See Mental Status Examination and Suicide Risk Assessment completed by Attending Physician prior to discharge.  Discharge destination:  Home  Is patient on multiple antipsychotic therapies at discharge:  No  Has Patient had three or more failed trials of antipsychotic monotherapy by history: N/A Recommended Plan for Multiple Antipsychotic Therapies: N/A Discharge Orders   Future Appointments Provider Department Dept Phone   07/16/2013 8:00 AM York Spaniel, MD [facility] [phone]   Future Orders Complete By Expires     Activity as tolerated - No restrictions  As directed         Medication List       Indication   clonazePAM 0.5 MG tablet  Commonly known as:  KLONOPIN  Take 1 tablet (0.5 mg total) by mouth 3 (three) times daily as needed for anxiety. For anxiety   Indication:  Panic Disorder     DULoxetine 20 MG capsule  Commonly known as:  CYMBALTA  Take 1 capsule (20 mg total) by mouth daily. For depression and pain.   Indication:  Major Depressive Disorder     insulin aspart 100 UNIT/ML injection  Commonly known as:  novoLOG  Inject 1-20 Units into the skin 6 (six) times daily. In addition to mealtime coverage. Pt subtracts 100 from measured blood sugar and divides by 35 to determine amount of insulin.   Indication:  Type 2 Diabetes     insulin glargine 100 UNIT/ML injection  Commonly known as:  LANTUS  Inject 0.4 mLs (40 Units total) into the skin daily before breakfast. Takes at 8am   Indication:  Insulin-Dependent Diabetes     lurasidone 40 MG Tabs  Commonly known as:  LATUDA  Take 1 tablet (40 mg total) by mouth daily with supper. For mood stability.   Indication:  Depression     multivitamin with minerals Tabs  Take 1 tablet by mouth daily.   Indication:  Vitamin Supplementation     ondansetron 4 MG tablet  Commonly known as:  ZOFRAN  Take 1  tablet (4 mg total) by mouth every 8 (eight) hours as needed for nausea.      promethazine 25 MG tablet  Commonly known as:  PHENERGAN  Take 25 mg by mouth every 6 (six) hours as needed for nausea.      propranolol 40 MG tablet  Commonly known as:  INDERAL  Take 1 tablet (40 mg total) by mouth 2 (two) times daily.   Indication:  Migraine Headache     SUMAtriptan 100 MG tablet  Commonly known as:  IMITREX  Take 1 tablet (100 mg total) by mouth every 2 (two) hours as needed for migraine.   Indication:  Migraine Headache     zolpidem 10 MG tablet  Commonly known as:  AMBIEN  Take 1 tablet (10 mg total) by mouth at bedtime as needed for sleep. For sleep   Indication:  Trouble Sleeping           Follow-up Information   Follow up with [facility] On 01/15/2013. (Appointment scheduled at 1:00 pm with Breck Coons for therapy)    Contact information:   [address] [place], [place]   [address] Phone: [phone] Fax: [phone]     Follow-up recommendations:   Activities: Resume typical activities Diet: Resume typical diet Tests: none Other: Follow up with outpatient provider and report any side effects to out patient prescriber.  Comments:  Take all your medications as prescribed by your mental healthcare provider. Report any adverse effects and or reactions from your medicines to your outpatient provider promptly. Patient is instructed and cautioned to not engage in alcohol and or illegal drug use while on prescription medicines. In the event of worsening symptoms, patient is instructed to call the crisis hotline, 911 and or go to the nearest ED for appropriate evaluation and treatment of symptoms. Follow-up with your primary care provider for your other medical issues, concerns and or health care needs. Continue to work on the life style changes that could help better manage your mood Signed: Fransisca Kaufmann NP-C 01/09/2013 10:42 AM

## 2013-01-09 NOTE — Plan of Care (Signed)
Problem: Ineffective individual coping Goal: STG: Patient will participate in after care plan Patient is attending group and discussing issues. Outpatient follow up to be scheduled prior to discharge.  Horace Porteous Hiilani Jetter, LCSW 01/07/2013  Outcome: Completed/Met Date Met:  01/09/13 Patient has attended groups and easily engaged in discussions.  Outpatient follow up is scheduled with [facility]. Stephinie Battisti, LCSW 01/09/2013

## 2013-01-09 NOTE — BHH Group Notes (Signed)
[facility] LCSW Aftercare Discharge Planning Group Note   01/09/2013 11:48 AM  Participation Quality:  Appropriate  Mood/Affect:  Appropriate  Depression Rating:  0  Anxiety Rating:  0  Thoughts of Suicide:  No  Will you contract for safety?   NA  Current AVH:  No  Plan for Discharge/Comments:  Patient attending discharge planning group and actively participated in group.  CSW provided all participants with daily workbook.  She reports doing well and being ready to discharge home today.   Transportation Means:  Patient has transportation.   Supports:  Patient has a good support system.   Tasfia Vasseur, Joesph July

## 2013-01-09 NOTE — Progress Notes (Signed)
[facility] Adult Case Management Discharge Plan :  Will you be returning to the same living situation after discharge: Yes,  Patient is returning to her home. At discharge, do you have transportation home?:Yes,  Patient to arrange transportation home. Do you have the ability to pay for your medications:Yes,  Patient can obtain medications.  Release of information consent forms completed and in the chart;  Patient's signature needed at discharge.  Patient to Follow up at: Follow-up Information   Follow up with [facility] On 01/15/2013. (Appointment scheduled at 1:00 pm with Breck Coons for therapy)    Contact information:   [address] [place], [place]   [address] Phone: [phone] Fax: [phone]      Patient denies SI/HI:   Patient no longer endorsing SI/HI or other thoughts of self harm.     Safety Planning and Suicide Prevention discussed: .Reviewed with all patients during discharge planning group   Waqas Bruhl, Joesph July 01/09/2013, 11:46 AM

## 2013-01-09 NOTE — BHH Suicide Risk Assessment (Signed)
Suicide Risk Assessment  Discharge Assessment     Demographic Factors:  Caucasian  Mental Status Per Nursing Assessment::   On Admission:     Current Mental Status by Physician: In full contact with reality. There are no suicidal ideas, plans or intent. Her mood is euthymic, her affect is appropriate. She states she feels much better. She is looking forward to having the eye surgery so she " can see again." She is going to pursue further outpatient treatment. States she is not going to be alone as she lives in a house full of people. She is going to have a leave from work while she recovers from the surgery   Loss Factors: Decline in physical health  Historical Factors: NA  Risk Reduction Factors:   Sense of responsibility to family, Living with another person, especially a relative and Positive social support  Continued Clinical Symptoms:  Depression:   Insomnia  Cognitive Features That Contribute To Risk: None identified   Suicide Risk:  Minimal: No identifiable suicidal ideation.  Patients presenting with no risk factors but with morbid ruminations; may be classified as minimal risk based on the severity of the depressive symptoms  Discharge Diagnoses:   AXIS I:  Anxiety Disorder NOS and Major Depression, Recurrent severe AXIS II:  Deferred AXIS III:   Past Medical History  Diagnosis Date  . Ovarian cyst   . Depression   . Cluster headache syndrome, intractable   . Migraine without aura, with intractable migraine, so stated, without mention of status migrainosus 12/05/2012  . Type I diabetes mellitus    AXIS IV:  other psychosocial or environmental problems AXIS V:  61-70 mild symptoms  Plan Of Care/Follow-up recommendations:  Activity:  as tolerated Diet:  regular Follow up outpatient basis Is patient on multiple antipsychotic therapies at discharge:  No   Has Patient had three or more failed trials of antipsychotic monotherapy by history:  No  Recommended Plan  for Multiple Antipsychotic Therapies: N/A   Japneet Staggs A 01/09/2013, 12:49 PM

## 2013-01-09 NOTE — Progress Notes (Signed)
Adult Psychoeducational Group Note  Date:  01/09/2013 Time:  12:44 PM  Group Topic/Focus:  Relapse Prevention Planning:   The focus of this group is to define relapse and discuss the need for planning to combat relapse.  Participation Level:  None  Participation Quality:  Appropriate and Attentive  Affect:  Flat  Cognitive:  Alert and Appropriate  Insight: Lacking  Engagement in Group:  None  Modes of Intervention:  Activity, Discussion, Socialization and Support  Additional Comments:  Pt came to group, but did not speak. The purpose of this group was for the patient to identify triggers, early warning signs and old behaviors that lead to relapse. Pt made a personalized relapse prevention plan that included different coping skills to prevent reverting to unhealthy behaviors.  Cathlean Cower 01/09/2013, 12:44 PM

## 2013-01-09 NOTE — Tx Team (Signed)
Interdisciplinary Treatment Plan Update   Date Reviewed:  01/09/2013  Time Reviewed:  11:51 AM  Progress in Treatment:   Attending groups: Yes Participating in groups: Yes Taking medication as prescribed: Yes  Tolerating medication: Yes Family/Significant other contact made:Patient declined to provide consent Patient understands diagnosis: Yes  Discussing patient identified problems/goals with staff: Yes Medical problems stabilized or resolved: Yes Denies suicidal/homicidal ideation: Yes Patient has not harmed self or others: Yes  For review of initial/current patient goals, please see plan of care.  Estimated Length of Stay: Discharge today.  Reasons for Continued Hospitalization:   New Problems/Goals identified:    Discharge Plan or Barriers:   Home with outpatient follow up  Additional Comments: N/A  Attendees:  Patient:  01/09/2013 11:51 AM   Signature: Lamount Cranker, RN 01/09/2013 11:51 AM  Signature: 01/09/2013 11:51 AM  Signature: 01/09/2013 11:51 AM  Signature: 01/09/2013 11:51 AM  Signature:   01/09/2013 11:51 AM  Signature:  Juline Patch, LCSW 01/09/2013 11:51 AM  Signature:  Reyes Ivan, LCSW 01/09/2013 11:51 AM  Signature:  Maseta Dorley,Care Coordinator 01/09/2013 11:51 AM  Signature: Fransisca Kaufmann, [facility] 01/09/2013 11:51 AM  Signature:    Signature:    Signature:      Scribe for Treatment Team:   Juline Patch,  01/09/2013 11:51 AM

## 2013-01-12 NOTE — Progress Notes (Signed)
Patient Discharge Instructions:  After Visit Summary (AVS):   Faxed to:  01/12/13 Discharge Summary Note:   Faxed to:  01/12/13 Psychiatric Admission Assessment Note:   Faxed to:  01/12/13 Suicide Risk Assessment - Discharge Assessment:   Faxed to:  01/12/13 Faxed/Sent to the Next Level Care provider:  01/12/13 Faxed to [facility] @ [phone]  Jerelene Redden, 01/12/2013, 3:33 PM

## 2013-04-02 ENCOUNTER — Emergency Department: Payer: Self-pay | Admitting: Internal Medicine

## 2013-04-02 LAB — URINALYSIS, COMPLETE
Bilirubin,UR: NEGATIVE
Nitrite: POSITIVE
Protein: 100
RBC,UR: 529 /HPF (ref 0–5)
Squamous Epithelial: 2
WBC UR: 1365 /HPF (ref 0–5)

## 2013-04-02 LAB — PREGNANCY, URINE: Pregnancy Test, Urine: NEGATIVE m[IU]/mL

## 2013-04-03 LAB — URINE CULTURE

## 2013-06-23 DIAGNOSIS — E113599 Type 2 diabetes mellitus with proliferative diabetic retinopathy without macular edema, unspecified eye: Secondary | ICD-10-CM | POA: Insufficient documentation

## 2013-06-23 DIAGNOSIS — H2702 Aphakia, left eye: Secondary | ICD-10-CM | POA: Insufficient documentation

## 2013-06-23 DIAGNOSIS — H3320 Serous retinal detachment, unspecified eye: Secondary | ICD-10-CM | POA: Insufficient documentation

## 2013-06-30 ENCOUNTER — Other Ambulatory Visit: Payer: Self-pay | Admitting: Obstetrics & Gynecology

## 2013-06-30 ENCOUNTER — Other Ambulatory Visit (HOSPITAL_COMMUNITY)
Admission: RE | Admit: 2013-06-30 | Discharge: 2013-06-30 | Disposition: A | Discharge status: Home / Self Care | Payer: BC Managed Care – PPO | Source: Ambulatory Visit | Attending: Obstetrics & Gynecology | Admitting: Obstetrics & Gynecology

## 2013-06-30 DIAGNOSIS — Z1151 Encounter for screening for human papillomavirus (HPV): Secondary | ICD-10-CM | POA: Insufficient documentation

## 2013-06-30 DIAGNOSIS — Z01419 Encounter for gynecological examination (general) (routine) without abnormal findings: Secondary | ICD-10-CM | POA: Insufficient documentation

## 2013-07-16 ENCOUNTER — Telehealth: Payer: Self-pay | Admitting: Neurology

## 2013-07-16 ENCOUNTER — Ambulatory Visit: Payer: BC Managed Care – PPO | Admitting: Neurology

## 2013-07-16 NOTE — Telephone Encounter (Signed)
This patient did not show for revisit appointment today. 

## 2013-07-19 ENCOUNTER — Inpatient Hospital Stay: Payer: Self-pay | Admitting: Internal Medicine

## 2013-07-19 LAB — COMPREHENSIVE METABOLIC PANEL
ALT: 14 U/L (ref 12–78)
Albumin: 4.3 g/dL (ref 3.4–5.0)
Alkaline Phosphatase: 97 U/L
Anion Gap: 21 — ABNORMAL HIGH (ref 7–16)
BILIRUBIN TOTAL: 0.4 mg/dL (ref 0.2–1.0)
BUN: 14 mg/dL (ref 7–18)
CALCIUM: 9.4 mg/dL (ref 8.5–10.1)
Chloride: 103 mmol/L (ref 98–107)
Co2: 10 mmol/L — CL (ref 21–32)
Creatinine: 1.09 mg/dL (ref 0.60–1.30)
GLUCOSE: 386 mg/dL — AB (ref 65–99)
Osmolality: 285 (ref 275–301)
Potassium: 4.8 mmol/L (ref 3.5–5.1)
SGOT(AST): 5 U/L — ABNORMAL LOW (ref 15–37)
SODIUM: 134 mmol/L — AB (ref 136–145)
TOTAL PROTEIN: 8.7 g/dL — AB (ref 6.4–8.2)

## 2013-07-19 LAB — CBC WITH DIFFERENTIAL/PLATELET
Basophil #: 0.1 10*3/uL (ref 0.0–0.1)
Basophil %: 0.6 %
Eosinophil #: 0 10*3/uL (ref 0.0–0.7)
Eosinophil %: 0 %
HCT: 46.1 % (ref 35.0–47.0)
HGB: 14.5 g/dL (ref 12.0–16.0)
Lymphocyte #: 1.3 10*3/uL (ref 1.0–3.6)
Lymphocyte %: 8.5 %
MCH: 29.3 pg (ref 26.0–34.0)
MCHC: 31.5 g/dL — ABNORMAL LOW (ref 32.0–36.0)
MCV: 93 fL (ref 80–100)
Monocyte #: 0.7 x10 3/mm (ref 0.2–0.9)
Monocyte %: 4.5 %
Neutrophil #: 13.7 10*3/uL — ABNORMAL HIGH (ref 1.4–6.5)
Neutrophil %: 86.4 %
Platelet: 469 10*3/uL — ABNORMAL HIGH (ref 150–440)
RBC: 4.95 10*6/uL (ref 3.80–5.20)
RDW: 13.5 % (ref 11.5–14.5)
WBC: 15.9 10*3/uL — ABNORMAL HIGH (ref 3.6–11.0)

## 2013-07-19 LAB — URINALYSIS, COMPLETE
Bacteria: NONE SEEN
Bilirubin,UR: NEGATIVE
Blood: NEGATIVE
Leukocyte Esterase: NEGATIVE
Nitrite: NEGATIVE
Ph: 5 (ref 4.5–8.0)
RBC,UR: 1 /HPF (ref 0–5)
Specific Gravity: 1.021 (ref 1.003–1.030)
Squamous Epithelial: <1

## 2013-07-19 LAB — LIPASE, BLOOD: Lipase: 45 U/L — ABNORMAL LOW (ref 73–393)

## 2013-07-20 LAB — BASIC METABOLIC PANEL
ANION GAP: 10 (ref 7–16)
Anion Gap: 15 (ref 7–16)
Anion Gap: 7 (ref 7–16)
BUN: 11 mg/dL (ref 7–18)
BUN: 7 mg/dL (ref 7–18)
BUN: 8 mg/dL (ref 7–18)
CALCIUM: 8.3 mg/dL — AB (ref 8.5–10.1)
CHLORIDE: 113 mmol/L — AB (ref 98–107)
CO2: 15 mmol/L — AB (ref 21–32)
CO2: 14 mmol/L — AB (ref 21–32)
CO2: 10 mmol/L — AB (ref 21–32)
Calcium, Total: 8 mg/dL — ABNORMAL LOW (ref 8.5–10.1)
Calcium, Total: 8.4 mg/dL — ABNORMAL LOW (ref 8.5–10.1)
Chloride: 114 mmol/L — ABNORMAL HIGH (ref 98–107)
Chloride: 116 mmol/L — ABNORMAL HIGH (ref 98–107)
Creatinine: 0.67 mg/dL (ref 0.60–1.30)
Creatinine: 0.74 mg/dL (ref 0.60–1.30)
Creatinine: 0.96 mg/dL (ref 0.60–1.30)
EGFR (African American): >60
EGFR (African American): >60
EGFR (Non-African Amer.): >60
GLUCOSE: 161 mg/dL — AB (ref 65–99)
GLUCOSE: 74 mg/dL (ref 65–99)
Glucose: 164 mg/dL — ABNORMAL HIGH (ref 65–99)
OSMOLALITY: 267 (ref 275–301)
Osmolality: 277 (ref 275–301)
Osmolality: 284 (ref 275–301)
POTASSIUM: 3.9 mmol/L (ref 3.5–5.1)
POTASSIUM: 4.6 mmol/L (ref 3.5–5.1)
Potassium: 4.5 mmol/L (ref 3.5–5.1)
SODIUM: 138 mmol/L (ref 136–145)
SODIUM: 135 mmol/L — AB (ref 136–145)
SODIUM: 141 mmol/L (ref 136–145)

## 2013-07-20 LAB — CBC WITH DIFFERENTIAL/PLATELET
Basophil #: 0 10*3/uL (ref 0.0–0.1)
Basophil %: 0.1 %
Eosinophil #: 0 10*3/uL (ref 0.0–0.7)
Eosinophil %: 0 %
HCT: 39.2 % (ref 35.0–47.0)
HGB: 12.6 g/dL (ref 12.0–16.0)
Lymphocyte #: 2.3 10*3/uL (ref 1.0–3.6)
Lymphocyte %: 15.3 %
MCH: 29.8 pg (ref 26.0–34.0)
MCHC: 32.1 g/dL (ref 32.0–36.0)
MCV: 93 fL (ref 80–100)
MONO ABS: 1.3 x10 3/mm — AB (ref 0.2–0.9)
MONOS PCT: 8.4 %
Neutrophil #: 11.5 10*3/uL — ABNORMAL HIGH (ref 1.4–6.5)
Neutrophil %: 76.2 %
Platelet: 368 10*3/uL (ref 150–440)
RBC: 4.23 10*6/uL (ref 3.80–5.20)
RDW: 13.6 % (ref 11.5–14.5)
WBC: 15.1 10*3/uL — ABNORMAL HIGH (ref 3.6–11.0)

## 2013-07-20 LAB — MAGNESIUM: Magnesium: 1.8 mg/dL

## 2013-07-21 LAB — CBC WITH DIFFERENTIAL/PLATELET
Basophil #: 0 10*3/uL (ref 0.0–0.1)
Basophil %: 0.6 %
Eosinophil #: 0.1 10*3/uL (ref 0.0–0.7)
Eosinophil %: 1.3 %
HCT: 31.7 % — AB (ref 35.0–47.0)
HGB: 10.6 g/dL — ABNORMAL LOW (ref 12.0–16.0)
Lymphocyte #: 3.1 10*3/uL (ref 1.0–3.6)
Lymphocyte %: 52.7 %
MCH: 29.9 pg (ref 26.0–34.0)
MCHC: 33.3 g/dL (ref 32.0–36.0)
MCV: 90 fL (ref 80–100)
MONO ABS: 0.4 x10 3/mm (ref 0.2–0.9)
Monocyte %: 7.2 %
NEUTROS ABS: 2.2 10*3/uL (ref 1.4–6.5)
Neutrophil %: 38.2 %
Platelet: 263 10*3/uL (ref 150–440)
RBC: 3.53 10*6/uL — AB (ref 3.80–5.20)
RDW: 13.3 % (ref 11.5–14.5)
WBC: 5.9 10*3/uL (ref 3.6–11.0)

## 2013-09-14 ENCOUNTER — Encounter (HOSPITAL_COMMUNITY): Payer: Self-pay | Admitting: Emergency Medicine

## 2013-09-14 ENCOUNTER — Emergency Department (HOSPITAL_COMMUNITY)
Admission: EM | Admit: 2013-09-14 | Discharge: 2013-09-14 | Disposition: A | Discharge status: Home / Self Care | Payer: BC Managed Care – PPO | Attending: Emergency Medicine | Admitting: Emergency Medicine

## 2013-09-14 ENCOUNTER — Emergency Department (HOSPITAL_COMMUNITY): Payer: BC Managed Care – PPO

## 2013-09-14 DIAGNOSIS — Z8742 Personal history of other diseases of the female genital tract: Secondary | ICD-10-CM | POA: Insufficient documentation

## 2013-09-14 DIAGNOSIS — R000 Tachycardia, unspecified: Secondary | ICD-10-CM | POA: Insufficient documentation

## 2013-09-14 DIAGNOSIS — E109 Type 1 diabetes mellitus without complications: Secondary | ICD-10-CM | POA: Insufficient documentation

## 2013-09-14 DIAGNOSIS — F329 Major depressive disorder, single episode, unspecified: Secondary | ICD-10-CM | POA: Insufficient documentation

## 2013-09-14 DIAGNOSIS — Z87440 Personal history of urinary (tract) infections: Secondary | ICD-10-CM | POA: Insufficient documentation

## 2013-09-14 DIAGNOSIS — G43019 Migraine without aura, intractable, without status migrainosus: Secondary | ICD-10-CM | POA: Insufficient documentation

## 2013-09-14 DIAGNOSIS — Z3202 Encounter for pregnancy test, result negative: Secondary | ICD-10-CM | POA: Insufficient documentation

## 2013-09-14 DIAGNOSIS — Z87442 Personal history of urinary calculi: Secondary | ICD-10-CM | POA: Insufficient documentation

## 2013-09-14 DIAGNOSIS — F3289 Other specified depressive episodes: Secondary | ICD-10-CM | POA: Insufficient documentation

## 2013-09-14 DIAGNOSIS — Z79899 Other long term (current) drug therapy: Secondary | ICD-10-CM | POA: Insufficient documentation

## 2013-09-14 DIAGNOSIS — N12 Tubulo-interstitial nephritis, not specified as acute or chronic: Secondary | ICD-10-CM | POA: Insufficient documentation

## 2013-09-14 DIAGNOSIS — Z880 Allergy status to penicillin: Secondary | ICD-10-CM | POA: Insufficient documentation

## 2013-09-14 DIAGNOSIS — Z794 Long term (current) use of insulin: Secondary | ICD-10-CM | POA: Insufficient documentation

## 2013-09-14 LAB — CBC WITH DIFFERENTIAL/PLATELET
BASOS ABS: 0 10*3/uL (ref 0.0–0.1)
BASOS PCT: 1 % (ref 0–1)
EOS PCT: 3 % (ref 0–5)
Eosinophils Absolute: 0.2 10*3/uL (ref 0.0–0.7)
HEMATOCRIT: 42.1 % (ref 36.0–46.0)
Hemoglobin: 14.2 g/dL (ref 12.0–15.0)
LYMPHS PCT: 36 % (ref 12–46)
Lymphs Abs: 2 10*3/uL (ref 0.7–4.0)
MCH: 29.9 pg (ref 26.0–34.0)
MCHC: 33.7 g/dL (ref 30.0–36.0)
MCV: 88.6 fL (ref 78.0–100.0)
MONO ABS: 0.2 10*3/uL (ref 0.1–1.0)
MONOS PCT: 4 % (ref 3–12)
Neutro Abs: 3.1 10*3/uL (ref 1.7–7.7)
Neutrophils Relative %: 56 % (ref 43–77)
Platelets: 414 10*3/uL — ABNORMAL HIGH (ref 150–400)
RBC: 4.75 MIL/uL (ref 3.87–5.11)
RDW: 13.1 % (ref 11.5–15.5)
WBC: 5.5 10*3/uL (ref 4.0–10.5)

## 2013-09-14 LAB — BASIC METABOLIC PANEL
BUN: 6 mg/dL (ref 6–23)
CALCIUM: 9.6 mg/dL (ref 8.4–10.5)
CO2: 21 meq/L (ref 19–32)
CREATININE: 0.52 mg/dL (ref 0.50–1.10)
Chloride: 101 mEq/L (ref 96–112)
GFR calc Af Amer: >90 mL/min (ref >90)
GFR calc non Af Amer: >90 mL/min (ref >90)
Glucose, Bld: 191 mg/dL — ABNORMAL HIGH (ref 70–99)
Potassium: 4.2 mEq/L (ref 3.7–5.3)
Sodium: 137 mEq/L (ref 137–147)

## 2013-09-14 LAB — URINALYSIS, ROUTINE W REFLEX MICROSCOPIC
BILIRUBIN URINE: NEGATIVE
GLUCOSE, UA: 500 mg/dL — AB
KETONES UR: NEGATIVE mg/dL
LEUKOCYTES UA: NEGATIVE
Nitrite: POSITIVE — AB
PROTEIN: NEGATIVE mg/dL
Specific Gravity, Urine: 1.018 (ref 1.005–1.030)
Urobilinogen, UA: 0.2 mg/dL (ref 0.0–1.0)
pH: 6.5 (ref 5.0–8.0)

## 2013-09-14 LAB — POC URINE PREG, ED: Preg Test, Ur: NEGATIVE

## 2013-09-14 LAB — URINE MICROSCOPIC-ADD ON

## 2013-09-14 MED ORDER — CEPHALEXIN 500 MG PO CAPS: 500.0000 mg | ORAL_CAPSULE | Freq: Four times a day (QID) | ORAL | Status: DC

## 2013-09-14 MED ORDER — HYDROCODONE-ACETAMINOPHEN 5-325 MG PO TABS: 1.0000 | ORAL_TABLET | Freq: Four times a day (QID) | ORAL | Status: DC | PRN

## 2013-09-14 MED ORDER — SODIUM CHLORIDE 0.9 % IV BOLUS (SEPSIS)
1000.0000 mL | Freq: Once | INTRAVENOUS | Status: AC
  Administered 2013-09-14: 1000 mL via INTRAVENOUS

## 2013-09-14 MED ORDER — ONDANSETRON HCL 4 MG/2ML IJ SOLN
4.0000 mg | Freq: Once | INTRAMUSCULAR | Status: AC
  Administered 2013-09-14: 4 mg via INTRAVENOUS
  Filled 2013-09-14: qty 2

## 2013-09-14 MED ORDER — ONDANSETRON HCL 4 MG PO TABS: 4.0000 mg | ORAL_TABLET | Freq: Four times a day (QID) | ORAL | Status: DC

## 2013-09-14 MED ORDER — HYDROMORPHONE HCL PF 1 MG/ML IJ SOLN
1.0000 mg | Freq: Once | INTRAMUSCULAR | Status: AC
  Administered 2013-09-14: 1 mg via INTRAVENOUS
  Filled 2013-09-14: qty 1

## 2013-09-14 MED ORDER — DEXTROSE 5 % IV SOLN
1.0000 g | Freq: Once | INTRAVENOUS | Status: AC
  Administered 2013-09-14: 1 g via INTRAVENOUS
  Filled 2013-09-14: qty 10

## 2013-09-14 MED ORDER — DIPHENHYDRAMINE HCL 25 MG PO CAPS
25.0000 mg | ORAL_CAPSULE | Freq: Once | ORAL | Status: AC
  Administered 2013-09-14: 25 mg via ORAL
  Filled 2013-09-14: qty 1

## 2013-09-14 MED ORDER — OXYCODONE-ACETAMINOPHEN 5-325 MG PO TABS
1.0000 | ORAL_TABLET | Freq: Once | ORAL | Status: AC
  Administered 2013-09-14: 1 via ORAL
  Filled 2013-09-14: qty 1

## 2013-09-14 NOTE — Discharge Instructions (Signed)

## 2013-09-14 NOTE — ED Provider Notes (Signed)
CSN: [id number]     Arrival date & time 09/14/13  [id number] History   First MD Initiated Contact with Patient 09/14/13 [phone]     Chief Complaint  Patient presents with  . Flank Pain     (Consider location/radiation/quality/duration/timing/severity/associated sxs/prior Treatment) HPI  This is a 38 year old female with a history of diabetes, kidney stones who presents with one-week history of left flank pain. Patient states onset of symptoms was last Saturday. She reports recent UTI in February and finished floxacillin approximately 2 weeks ago. Patient reports left sharp flank pain that is nonradiating. It is constant but waxes and wanes in intensity. She states that it got much worse overnight. Current pain is 10 out of 10. She is associated dysuria and frequency. She denies any hematuria. She states that this is similar to prior kidney stones. Last CT scan was 2011. Patient reports associated nausea without vomiting. She denies fever but does endorse subjective chills.  Past Medical History  Diagnosis Date  . Ovarian cyst   . Depression   . Cluster headache syndrome, intractable   . Migraine without aura, with intractable migraine, so stated, without mention of status migrainosus 12/05/2012  . Type I diabetes mellitus    Past Surgical History  Procedure Laterality Date  . Appendectomy    . Cholecystectomy    . Ovarian cyst removal    . Bunionectomy Bilateral    Family History  Problem Relation Age of Onset  . Adopted: Yes   History  Substance Use Topics  . Smoking status: Never Smoker   . Smokeless tobacco: Never Used  . Alcohol Use: Yes     Comment: 12/05/2012 "glass of wine maybe once/month"   OB History   Grav Para Term Preterm Abortions TAB SAB Ect Mult Living                 Review of Systems  Constitutional: Positive for chills. Negative for fever.  Respiratory: Negative for cough, chest tightness and shortness of breath.   Cardiovascular: Negative for chest pain.   Gastrointestinal: Positive for nausea. Negative for vomiting, abdominal pain and diarrhea.  Genitourinary: Positive for dysuria, urgency and flank pain.  Musculoskeletal: Negative for back pain.  Skin: Negative for rash.  Neurological: Negative for headaches.  All other systems reviewed and are negative.      Allergies  Gluten meal; Sulfa antibiotics; Wellbutrin; Amoxicillin; and Codeine  Home Medications   Current Outpatient Rx  Name  Route  Sig  Dispense  Refill  . clonazePAM (KLONOPIN) 0.5 MG tablet   Oral   Take 1 tablet (0.5 mg total) by mouth 3 (three) times daily as needed for anxiety. For anxiety   30 tablet   0   . DULoxetine (CYMBALTA) 60 MG capsule   Oral   Take 60 mg by mouth daily.         . insulin aspart (NOVOLOG) 100 UNIT/ML injection   Subcutaneous   Inject 1-20 Units into the skin 6 (six) times daily. In addition to mealtime coverage. Pt subtracts 100 from measured blood sugar and divides by 35 to determine amount of insulin.   1 vial   12   . insulin glargine (LANTUS) 100 UNIT/ML injection   Subcutaneous   Inject 20 Units into the skin 2 (two) times daily.         . Multiple Vitamin (MULTIVITAMIN WITH MINERALS) TABS   Oral   Take 1 tablet by mouth daily.         .Marland Kitchen  zolpidem (AMBIEN) 10 MG tablet   Oral   Take 1 tablet (10 mg total) by mouth at bedtime as needed for sleep. For sleep   30 tablet   0   . cephALEXin (KEFLEX) 500 MG capsule   Oral   Take 1 capsule (500 mg total) by mouth 4 (four) times daily.   56 capsule   0   . HYDROcodone-acetaminophen (NORCO/VICODIN) 5-325 MG per tablet   Oral   Take 1-2 tablets by mouth every 6 (six) hours as needed.   15 tablet   0   . ondansetron (ZOFRAN) 4 MG tablet   Oral   Take 1 tablet (4 mg total) by mouth every 6 (six) hours.   12 tablet   0    BP 102/61  Pulse 82  Temp(Src) 98.3 F (36.8 C) (Oral)  Resp 16  Ht 5\' 1"  (1.549 m)  Wt 155 lb (70.308 kg)  BMI 29.30 kg/m2  SpO2 97%   LMP 07/01/2013 Physical Exam  Nursing note and vitals reviewed. Constitutional: She is oriented to person, place, and time. No distress.  Uncomfortable appearing  HENT:  Head: Normocephalic and atraumatic.  Cardiovascular: Regular rhythm and normal heart sounds.   tachycardia  Pulmonary/Chest: Effort normal and breath sounds normal. No respiratory distress. She has no wheezes.  Abdominal: Soft. Bowel sounds are normal. There is no tenderness. There is no rebound.  Neurological: She is alert and oriented to person, place, and time.  Skin: Skin is warm and dry.  Psychiatric: She has a normal mood and affect.    ED Course  Procedures (including critical care time) Labs Review Labs Reviewed  CBC WITH DIFFERENTIAL - Abnormal; Notable for the following:    Platelets 414 (*)    All other components within normal limits  BASIC METABOLIC PANEL - Abnormal; Notable for the following:    Glucose, Bld 191 (*)    All other components within normal limits  URINALYSIS, ROUTINE W REFLEX MICROSCOPIC - Abnormal; Notable for the following:    Color, Urine ORANGE (*)    APPearance CLOUDY (*)    Glucose, UA 500 (*)    Hgb urine dipstick TRACE (*)    Nitrite POSITIVE (*)    All other components within normal limits  URINE MICROSCOPIC-ADD ON - Abnormal; Notable for the following:    Bacteria, UA FEW (*)    All other components within normal limits  POC URINE PREG, ED   Imaging Review Ct Abdomen Pelvis Wo Contrast  09/14/2013   CLINICAL DATA:  Left flank pain, history of kidney stones  EXAM: CT ABDOMEN AND PELVIS WITHOUT CONTRAST  TECHNIQUE: Multidetector CT imaging of the abdomen and pelvis was performed following the standard protocol without intravenous contrast.  COMPARISON:  07/20/2012  FINDINGS: Lung bases are unremarkable. Sagittal images of the spine are unremarkable. Mild lumbar levoscoliosis. Unenhanced liver shows no biliary ductal dilatation. Moderate stool in transverse colon. Abundant  stool in cecum. No pericecal inflammation. The patient is status post appendectomy. Status postcholecystectomy.  No nephrolithiasis. No hydronephrosis or hydroureter. No calcified ureteral calculi are noted.  IUD in place. Minimal distended distal small bowel loops probable minimal ileus. The terminal ileum is unremarkable. No adnexal mass is noted on this unenhanced scan. Bilateral distal ureter is unremarkable. No calcified calculi are noted within urinary bladder. Pelvic phleboliths. No inguinal adenopathy.  No ascites or free air.  No adenopathy.  IMPRESSION: 1. No nephrolithiasis.  No hydronephrosis or hydroureter. 2. No calcified ureteral calculi  are noted. 3. Abundant stool in right colon and cecum. No pericecal inflammation. Minimal distended distal small bowel loops probable mild ileus. 4. Status post appendectomy. Status post cholecystectomy. IUD in place.   Electronically Signed   By: Natasha Mead M.D.   On: 09/14/2013 09:35     EKG Interpretation None      MDM   Final diagnoses:  Pyelonephritis    Patient presents with flank pain. She is nontoxic on exam. Mildly tachycardic. Lab workup notable for nitrite-positive urine. History of kidney stones as well. CT noncontrasted obtained and shows no evidence of acute stone. Given systemic symptoms, suspect mild pyelonephritis. No stranding noted on the noncontrasted scan. No significant leukocytosis. Patient was given Rocephin. Given recent antibiotic treatment with Macrobid and Cipro was discharged home on Keflex for 14 days. Patient was able to tolerate by mouth prior to discharge.  After history, exam, and medical workup I feel the patient has been appropriately medically screened and is safe for discharge home. Pertinent diagnoses were discussed with the patient. Patient was given return precautions.     Shon Baton, MD 09/14/13 1210

## 2013-09-14 NOTE — ED Notes (Signed)
Initial Contact - pt resting on stretcher.  Pt reports pain improved at this time, still c/o 7/10 abd pain.  EDP at bs for re-eval, pt given snack/drink per EDP, tolerating well at this time.  Pt denies complaints.  Skin PWD.  Self repositioning for comfort.  NAD.

## 2013-09-14 NOTE — ED Notes (Signed)
Pt states that she has been having severe L flank pain for the past week, reports pain increased on Saturday, states she was diagnosed with a UTI recently and was told that she was resistant to Macrobid.  Pt also has a hx of kidney stones and her PCP sent her to the ED to be further evaluated for this. Pt reports nausea and vomiting, burning and frequency of urination.  Pt a&o x4, skin warm and dry, ambulatory to triage.

## 2013-10-03 ENCOUNTER — Emergency Department (HOSPITAL_COMMUNITY)
Admission: EM | Admit: 2013-10-03 | Discharge: 2013-10-03 | Disposition: A | Discharge status: Home / Self Care | Payer: BC Managed Care – PPO | Attending: Emergency Medicine | Admitting: Emergency Medicine

## 2013-10-03 ENCOUNTER — Encounter (HOSPITAL_COMMUNITY): Payer: Self-pay | Admitting: Emergency Medicine

## 2013-10-03 DIAGNOSIS — Z9889 Other specified postprocedural states: Secondary | ICD-10-CM | POA: Insufficient documentation

## 2013-10-03 DIAGNOSIS — Z3202 Encounter for pregnancy test, result negative: Secondary | ICD-10-CM | POA: Insufficient documentation

## 2013-10-03 DIAGNOSIS — R110 Nausea: Secondary | ICD-10-CM | POA: Insufficient documentation

## 2013-10-03 DIAGNOSIS — Z87442 Personal history of urinary calculi: Secondary | ICD-10-CM | POA: Insufficient documentation

## 2013-10-03 DIAGNOSIS — R071 Chest pain on breathing: Secondary | ICD-10-CM | POA: Insufficient documentation

## 2013-10-03 DIAGNOSIS — Z79899 Other long term (current) drug therapy: Secondary | ICD-10-CM | POA: Insufficient documentation

## 2013-10-03 DIAGNOSIS — Z880 Allergy status to penicillin: Secondary | ICD-10-CM | POA: Insufficient documentation

## 2013-10-03 DIAGNOSIS — R350 Frequency of micturition: Secondary | ICD-10-CM | POA: Insufficient documentation

## 2013-10-03 DIAGNOSIS — F3289 Other specified depressive episodes: Secondary | ICD-10-CM | POA: Insufficient documentation

## 2013-10-03 DIAGNOSIS — R300 Dysuria: Secondary | ICD-10-CM | POA: Insufficient documentation

## 2013-10-03 DIAGNOSIS — Z8742 Personal history of other diseases of the female genital tract: Secondary | ICD-10-CM | POA: Insufficient documentation

## 2013-10-03 DIAGNOSIS — G44009 Cluster headache syndrome, unspecified, not intractable: Secondary | ICD-10-CM | POA: Insufficient documentation

## 2013-10-03 DIAGNOSIS — F329 Major depressive disorder, single episode, unspecified: Secondary | ICD-10-CM | POA: Insufficient documentation

## 2013-10-03 DIAGNOSIS — Z9089 Acquired absence of other organs: Secondary | ICD-10-CM | POA: Insufficient documentation

## 2013-10-03 DIAGNOSIS — E109 Type 1 diabetes mellitus without complications: Secondary | ICD-10-CM | POA: Insufficient documentation

## 2013-10-03 DIAGNOSIS — R509 Fever, unspecified: Secondary | ICD-10-CM | POA: Insufficient documentation

## 2013-10-03 DIAGNOSIS — Z794 Long term (current) use of insulin: Secondary | ICD-10-CM | POA: Insufficient documentation

## 2013-10-03 DIAGNOSIS — R109 Unspecified abdominal pain: Secondary | ICD-10-CM | POA: Insufficient documentation

## 2013-10-03 LAB — CBC WITH DIFFERENTIAL/PLATELET
Basophils Absolute: 0 10*3/uL (ref 0.0–0.1)
Basophils Relative: 0 % (ref 0–1)
EOS ABS: 0.2 10*3/uL (ref 0.0–0.7)
EOS PCT: 2 % (ref 0–5)
HCT: 42.8 % (ref 36.0–46.0)
HEMOGLOBIN: 14.2 g/dL (ref 12.0–15.0)
LYMPHS ABS: 2.3 10*3/uL (ref 0.7–4.0)
LYMPHS PCT: 27 % (ref 12–46)
MCH: 30.3 pg (ref 26.0–34.0)
MCHC: 33.2 g/dL (ref 30.0–36.0)
MCV: 91.5 fL (ref 78.0–100.0)
Monocytes Absolute: 0.4 10*3/uL (ref 0.1–1.0)
Monocytes Relative: 5 % (ref 3–12)
NEUTROS PCT: 66 % (ref 43–77)
Neutro Abs: 5.7 10*3/uL (ref 1.7–7.7)
PLATELETS: 490 10*3/uL — AB (ref 150–400)
RBC: 4.68 MIL/uL (ref 3.87–5.11)
RDW: 13.4 % (ref 11.5–15.5)
WBC: 8.7 10*3/uL (ref 4.0–10.5)

## 2013-10-03 LAB — BASIC METABOLIC PANEL
BUN: 7 mg/dL (ref 6–23)
CO2: 19 meq/L (ref 19–32)
Calcium: 9.8 mg/dL (ref 8.4–10.5)
Chloride: 104 mEq/L (ref 96–112)
Creatinine, Ser: 0.5 mg/dL (ref 0.50–1.10)
GFR calc Af Amer: >90 mL/min (ref >90)
GFR calc non Af Amer: >90 mL/min (ref >90)
Glucose, Bld: 202 mg/dL — ABNORMAL HIGH (ref 70–99)
POTASSIUM: 4.9 meq/L (ref 3.7–5.3)
SODIUM: 137 meq/L (ref 137–147)

## 2013-10-03 LAB — URINALYSIS, ROUTINE W REFLEX MICROSCOPIC
BILIRUBIN URINE: NEGATIVE
GLUCOSE, UA: 100 mg/dL — AB
Hgb urine dipstick: NEGATIVE
KETONES UR: NEGATIVE mg/dL
Leukocytes, UA: NEGATIVE
NITRITE: NEGATIVE
PH: 6.5 (ref 5.0–8.0)
Protein, ur: NEGATIVE mg/dL
SPECIFIC GRAVITY, URINE: 1.003 — AB (ref 1.005–1.030)
Urobilinogen, UA: 0.2 mg/dL (ref 0.0–1.0)

## 2013-10-03 LAB — POC URINE PREG, ED: Preg Test, Ur: NEGATIVE

## 2013-10-03 MED ORDER — ONDANSETRON HCL 4 MG/2ML IJ SOLN
4.0000 mg | Freq: Once | INTRAMUSCULAR | Status: AC
  Administered 2013-10-03: 4 mg via INTRAVENOUS
  Filled 2013-10-03: qty 2

## 2013-10-03 MED ORDER — PHENAZOPYRIDINE HCL 200 MG PO TABS: 200.0000 mg | ORAL_TABLET | Freq: Three times a day (TID) | ORAL | Status: DC | PRN

## 2013-10-03 MED ORDER — ONDANSETRON 4 MG PO TBDP: 4.0000 mg | ORAL_TABLET | Freq: Three times a day (TID) | ORAL | Status: DC | PRN

## 2013-10-03 MED ORDER — KETOROLAC TROMETHAMINE 30 MG/ML IJ SOLN
30.0000 mg | Freq: Once | INTRAMUSCULAR | Status: AC
  Administered 2013-10-03: 30 mg via INTRAVENOUS
  Filled 2013-10-03: qty 1

## 2013-10-03 MED ORDER — HYDROCODONE-ACETAMINOPHEN 5-325 MG PO TABS: 1.0000 | ORAL_TABLET | ORAL | Status: DC | PRN

## 2013-10-03 MED ORDER — KETOROLAC TROMETHAMINE 30 MG/ML IJ SOLN
30.0000 mg | Freq: Once | INTRAMUSCULAR | Status: AC
Start: 2013-10-03 — End: 2013-10-03
  Administered 2013-10-03: 30 mg via INTRAVENOUS
  Filled 2013-10-03: qty 1

## 2013-10-03 NOTE — Discharge Instructions (Signed)
Take the prescribed medication as directed. Follow-up with [facility] as previously scheduled on 10/12/13. Return to the ED for new or worsening symptoms.

## 2013-10-03 NOTE — ED Notes (Signed)
Pt states that she has had an ongoing kidney infection since February.  Pt states that she checked her CBG last night and it was unreadable.  Was 155 this morning.  C/o severe lt flank pain.  Pt has an appt w/ urology on 4/13.

## 2013-10-03 NOTE — ED Provider Notes (Signed)
CSN: [id number]     Arrival date & time 10/03/13  1213 History   First MD Initiated Contact with Patient 10/03/13 1233     Chief Complaint  Patient presents with  . Flank Pain     (Consider location/radiation/quality/duration/timing/severity/associated sxs/prior Treatment) Patient is a 38 y.o. female presenting with flank pain. The history is provided by the patient and medical records.  Flank Pain Associated symptoms include nausea.   This is a 38 year old female with past medical history significant for type 1 diabetes, migraine headaches, depression, ovarian cysts, kidney stones, presenting to the ED for left flank pain.  Pt states she has had persistent UTI's since February without known cause.  She has been treated with courses of macrobid, keflex, and ciprofloxacin within the past 2 months with only temporary relief.  States yesterday pain worsened and has remained constant causing her to be unable to find a comfortable position. Continues to have dysuria and urinary frequency. Endorses some nausea since yesterday but no vomiting.  BM normal, last one was yesterday.  Endorses subjected fever and chills at home.  Pt has scheduled an OP appt with urology on 10/12/13.  Prior abdominal surgeries include appendectomy, cholecystectomy, ovarian cyst removal.  Recent CT on 09/14/13 which was negative for renal stones.  No chest pain or SOB.  VS stable on arrival.  Past Medical History  Diagnosis Date  . Ovarian cyst   . Depression   . Cluster headache syndrome, intractable   . Migraine without aura, with intractable migraine, so stated, without mention of status migrainosus 12/05/2012  . Type I diabetes mellitus    Past Surgical History  Procedure Laterality Date  . Appendectomy    . Cholecystectomy    . Ovarian cyst removal    . Bunionectomy Bilateral    Family History  Problem Relation Age of Onset  . Adopted: Yes   History  Substance Use Topics  . Smoking status: Never Smoker   .  Smokeless tobacco: Never Used  . Alcohol Use: Yes     Comment: 12/05/2012 "glass of wine maybe once/month"   OB History   Grav Para Term Preterm Abortions TAB SAB Ect Mult Living                 Review of Systems  Gastrointestinal: Positive for nausea.  Genitourinary: Positive for dysuria, frequency and flank pain.  All other systems reviewed and are negative.    Allergies  Gluten meal; Sulfa antibiotics; Wellbutrin; Amoxicillin; and Codeine  Home Medications   Current Outpatient Rx  Name  Route  Sig  Dispense  Refill  . acetaminophen (TYLENOL) 325 MG tablet   Oral   Take 650 mg by mouth every 6 (six) hours as needed (pain).         . clonazePAM (KLONOPIN) 0.5 MG tablet   Oral   Take 0.5 mg by mouth 2 (two) times daily as needed for anxiety (anxiety). For anxiety         . DULoxetine (CYMBALTA) 60 MG capsule   Oral   Take 60 mg by mouth daily.         . insulin aspart (NOVOLOG) 100 UNIT/ML injection   Subcutaneous   Inject 1-20 Units into the skin 6 (six) times daily. In addition to mealtime coverage. Pt subtracts 100 from measured blood sugar and divides by 35 to determine amount of insulin.   1 vial   12   . insulin glargine (LANTUS) 100 UNIT/ML injection   Subcutaneous  Inject 22 Units into the skin 2 (two) times daily.          . Multiple Vitamin (MULTIVITAMIN WITH MINERALS) TABS   Oral   Take 1 tablet by mouth daily.         . ondansetron (ZOFRAN) 4 MG tablet   Oral   Take 1 tablet (4 mg total) by mouth every 6 (six) hours.   12 tablet   0   . phenazopyridine (PYRIDIUM) 200 MG tablet   Oral   Take 200 mg by mouth 3 (three) times daily as needed for pain (pain).         Marland Kitchen zolpidem (AMBIEN) 10 MG tablet   Oral   Take 1 tablet (10 mg total) by mouth at bedtime as needed for sleep. For sleep   30 tablet   0    BP 159/94  Pulse 107  Temp(Src) 98.1 F (36.7 C) (Oral)  Resp 20  SpO2 99%  LMP 07/01/2013  Physical Exam  Nursing note  and vitals reviewed. Constitutional: She is oriented to person, place, and time. She appears well-developed and well-nourished. No distress.  Appears uncomfortable, NAD  HENT:  Head: Normocephalic and atraumatic.  Mouth/Throat: Oropharynx is clear and moist.  Eyes: Conjunctivae and EOM are normal. Pupils are equal, round, and reactive to light.  Neck: Normal range of motion.  Cardiovascular: Normal rate, regular rhythm and normal heart sounds.   Pulmonary/Chest: Effort normal and breath sounds normal. No respiratory distress. She has no wheezes. She has no rhonchi. She has no rales.  Posterior chest wall and ribs non-tender  Abdominal: Soft. Bowel sounds are normal. There is no tenderness. There is CVA tenderness (left). There is no guarding.  Abdomen soft, non-distended, no peritoneal signs Left CVA tenderness  Musculoskeletal: Normal range of motion.  Neurological: She is alert and oriented to person, place, and time.  Skin: Skin is warm and dry. She is not diaphoretic.  Psychiatric: She has a normal mood and affect.    ED Course  Procedures (including critical care time) Labs Review Labs Reviewed  URINALYSIS, ROUTINE W REFLEX MICROSCOPIC - Abnormal; Notable for the following:    Specific Gravity, Urine 1.003 (*)    Glucose, UA 100 (*)    All other components within normal limits  CBC WITH DIFFERENTIAL - Abnormal; Notable for the following:    Platelets 490 (*)    All other components within normal limits  BASIC METABOLIC PANEL - Abnormal; Notable for the following:    Glucose, Bld 202 (*)    All other components within normal limits  POC URINE PREG, ED   Imaging Review No results found.   EKG Interpretation None      MDM   Final diagnoses:  Left flank pain  Dysuria   Labs as above, glucose 202 without electrolyte imbalance.  Anion gap 14.  U/a without infection, no blood to suggest renal stones.  Doubt atypical presentation of PE.  Pt given toradol and zofran in  the ED with improvement of sx.  She remains afebrile and overall non-toxic appearing.  Rx vicodin, zofran, and pyridium for sx control.  She will FU with urology on 10/12/13 as previously scheduled.  Discussed plan with pt, she acknowledged understanding and agreed with plan of care.  Strict return precautions advised for new or worsening symptoms.  Garlon Hatchet, PA-C 10/03/13 1558

## 2013-10-03 NOTE — ED Notes (Signed)
Unsuccessful IV attempt by this RN 

## 2013-10-03 NOTE — ED Notes (Signed)
IV team notified and verbalizes will attempt IV insertion.

## 2013-10-03 NOTE — ED Notes (Signed)
Main lab contacted regarding BMP bloody draw. Genevie CheshireBilly reports will send staff down to attempt blood draw.

## 2013-10-03 NOTE — ED Provider Notes (Signed)
Medical screening examination/treatment/procedure(s) were performed by non-physician practitioner and as supervising physician I was immediately available for consultation/collaboration.   EKG Interpretation None        Glynn OctaveStephen Langford Carias, MD 10/03/13 [phone]

## 2013-10-05 ENCOUNTER — Encounter: Payer: Self-pay | Admitting: *Deleted

## 2013-10-05 ENCOUNTER — Encounter: Payer: BC Managed Care – PPO | Attending: Internal Medicine | Admitting: *Deleted

## 2013-10-05 VITALS — Ht 61.5 in | Wt 162.1 lb

## 2013-10-05 DIAGNOSIS — IMO0001 Reserved for inherently not codable concepts without codable children: Secondary | ICD-10-CM

## 2013-10-05 DIAGNOSIS — E1139 Type 2 diabetes mellitus with other diabetic ophthalmic complication: Secondary | ICD-10-CM | POA: Insufficient documentation

## 2013-10-05 DIAGNOSIS — Z713 Dietary counseling and surveillance: Secondary | ICD-10-CM | POA: Insufficient documentation

## 2013-10-05 DIAGNOSIS — Z794 Long term (current) use of insulin: Secondary | ICD-10-CM | POA: Insufficient documentation

## 2013-10-05 DIAGNOSIS — E11319 Type 2 diabetes mellitus with unspecified diabetic retinopathy without macular edema: Secondary | ICD-10-CM | POA: Insufficient documentation

## 2013-10-05 DIAGNOSIS — E119 Type 2 diabetes mellitus without complications: Secondary | ICD-10-CM

## 2013-10-05 NOTE — Progress Notes (Signed)
Introduction to Insulin Pump Therapy:  Appt start time: 1130 end time:  1300.  Assessment:  This patient has DM 1 and their primary concerns today: review of pump options.  This patient is interested in learning more about insulin pump therapy because she wants to decide which pump is best suited for her  MEDICATIONS: Basal Insulin: 19 units of Lantus twice daily via pen  Bolus Insulin: units of Novolog at pre meal  via pen  Other diabetes medications: none  [name] states they are testing 3-6 times per day Patient does currently have Ketone Strips  This patient is  currently adjusting bolus insulin based BG at a correction ratio of 1/35 mg/dl when not on Symlin, 2/841/70 when taking Symlin  This patient is currently adjusting bolus insulin based on carb intake at ratio of 1/15 grams carbohydrate when not on Symlin, 1/24 when taking Symlin  Patient states knowledge of Carb Counting is excellent  Usual physical activity: enjoys working out 2-5 times a week for about an hour, including cardio and weights  Last A1c was 8.8% on this date 01/23/13 Patient states complications from diabetes include retinopathy Patient states their biggest barrier with diabetes is the constant variability of her BGs  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  Progress Towards Obtaining an Insulin PumpGoal(s):  In progress.  Patient states their expectations of pump therapy include: better quality of life and better BG managemant Patient expresses understanding that for improved outcomes for their diabetes on an insulin pump they will:  Check BG 4-6 times per day  Change out pump infusion set at least every 3 days  Upload pump information to software on a regular basis so provider can assess patterns and make setting adjustments.     Intervention:    Patient knows difference between delivery of insulin via syringe/pen compared to  insulin pump.  Patient aware of improved insulin delivery via pump due to improved accuracy of dose and flexibility of adjusting bolus insulin based on carb intake and BG correction.  Patient understands pump, insulin reservoir and infusion set options, and button pushing for bolus delivery of insulin through the pump  Explained importance of testing BG at least 4 times per day for appropriate correction of high BG and prevention of DKA as applicable.  Emphasized importance of follow up after Pump Start for appropriate pump setting adjustments and on-going training on more advanced features.   Handouts given during visit include:  Insulin Pump Packet from [employer] offered. Will contact Rep for specific forms that need to be completed, I am out of them today  Monitoring/Evaluation:    Patient does  want to continue with pursuit of Medtronic insulin pump.  Follow up:  prn. Patient to call this office to make appointment for pump start when she knows pump is being shipped.

## 2013-10-07 ENCOUNTER — Ambulatory Visit: Payer: Self-pay | Admitting: *Deleted

## 2013-10-14 ENCOUNTER — Encounter: Payer: Self-pay | Admitting: *Deleted

## 2013-10-14 ENCOUNTER — Encounter: Payer: BC Managed Care – PPO | Admitting: *Deleted

## 2013-10-14 DIAGNOSIS — E119 Type 2 diabetes mellitus without complications: Principal | ICD-10-CM

## 2013-10-14 DIAGNOSIS — Z794 Long term (current) use of insulin: Principal | ICD-10-CM

## 2013-10-14 DIAGNOSIS — IMO0001 Reserved for inherently not codable concepts without codable children: Secondary | ICD-10-CM

## 2013-10-20 ENCOUNTER — Telehealth: Payer: Self-pay | Admitting: *Deleted

## 2013-10-20 NOTE — Telephone Encounter (Signed)
  Pump Follow Up Progress Note  Orders received from MD giving me permission to make insulin pump adjustments for the following patient.  Reviewed blood glucose logs on 10/20/13 via: [facility] and found the following:            Hypoglycemia Hyperglycemia Comments  Overnight Period:  YES  BASAL  Pre-Meal:    Breakfast      Lunch      Supper YES  ISF  Post-Meal: Breakfast  YES ICR   Lunch  YES ICR   Supper  YES ICR  Bedtime:       Comments: Average BG over past 6 days since she resumed pump therapy is 178 +/- 80 mg/dl. She had hypoglycemia during the night after the pump start, perhaps with residual Lantus, but we lowered her basal rate then from 1.25 u/hr to 1.00. She then adjusted her basal rates herself over the past couple of days to the settings below. She is having excursions of over 40 mg post meal so I plan to increase her Carb Ratio. There are a couple of low BGs after giving a correction so we will decrease her ISF per below too.  Pump Settings: Date: Current Date: 10/20/13  Changes in bold print   Basal Rate: Carb Ratio Sensitivity  Basal Rate: Carb Ratio Sensitivity   MN: 1.15 15 50 MN: 1.10  (-) 12  (+) 60  (-)  6 AM  1.10   6 AM 1.25  (+)                                                  Plan: Patient to continue using Bolus Wizard for meals and corrections. She is to notifiy me if BG are below 70 or above 300 mg/dl.   Follow up:  Patient to upload to [facility] within 5 days for further review

## 2013-10-22 NOTE — Progress Notes (Signed)
Insulin Pump Start Progress Note:  Patient appointment start time: 1615  End time 1715  Patient here for insulin pump start on Medtronic 530G pump and Silhouette infusion set Orders with pump settings received from MD Patient was on Medtronic Pump in the past so this is an Upgrade  Reviewed Pump Set Up including  Menu Settings  Bolus with Carb Ratio of 1 unit / 15 grams Carb, Correction Factor of 1 unit / 50 mg/dl  Suspend  Basal with initial Basal Rate of 1.25 units/hour  Reservoir Set Up  Utilities Pump Training Checklist completed Used Temp Basal of 12 duration @ 50 % basal due to patient taking their long acting insulin yesterday at 7 PM  Patient was shown how to sign up for [employer] and agrees to upload by 10/19/13 for review of progress and allow for pump setting adjustments  Patient successfully completed pump start and instructed to call me if BG drops below 60 mg/dl or goes above 161300 mg/dl or as directed by MD  Follow up plan: plan to meet for CGM start once school is out for the summer in June.

## 2013-11-06 ENCOUNTER — Telehealth: Payer: Self-pay | Admitting: *Deleted

## 2013-11-06 NOTE — Telephone Encounter (Signed)
  Pump Follow Up Progress Note  Orders received from MD giving me permission to make insulin pump adjustments for the following patient.  Reviewed blood glucose logs on 11/03/13 via: [facility] and found the following:            Hypoglycemia Hyperglycemia Comments  [facility]  BASAL  Pre-Meal:    Breakfast  YES BASAL   Lunch      Supper  YES BASAL  Post-Meal: Breakfast      Lunch      Supper  YES BASAL  Bedtime:       Comments: Average BG over past 6 days is 202 +/- 94 mg/dl but she is having less hypoglycemia so her average has climbed some due to that. . I see a gradual climb in her BGs in the afternoon and evening so plan to increase her Basal Rates by 10% beginning at noon per below  Pump Settings: Date: Current Date: 11/03/13  Changes in bold print   Basal Rate: Carb Ratio Sensitivity  Basal Rate: Carb Ratio Sensitivity   MN: 0.975 12 60 MN: 0.975   12   60    6 AM  1.25   6 AM 1.25          12 PM 1.40 (+)                                         Plan: Patient to continue using Bolus Wizard for meals and corrections. She is to notifiy me if BG are below 70 or above 300 mg/dl.   Follow up:  Patient to upload to [facility] within 7 days for further review. We plan to train on CGM once school is out for the summer.

## 2013-11-06 NOTE — Telephone Encounter (Signed)
Patient texted me that she had low BG after correcting a BG last night so she has changed her Sensitivity Factor from 60 to 70.

## 2013-11-09 ENCOUNTER — Emergency Department: Payer: Self-pay | Admitting: Emergency Medicine

## 2013-11-09 LAB — COMPREHENSIVE METABOLIC PANEL
ALBUMIN: 3.3 g/dL — AB (ref 3.4–5.0)
ANION GAP: 5 — AB (ref 7–16)
AST: 7 U/L — AB (ref 15–37)
Alkaline Phosphatase: 81 U/L
BUN: 10 mg/dL (ref 7–18)
Bilirubin,Total: 0.5 mg/dL (ref 0.2–1.0)
CO2: 24 mmol/L (ref 21–32)
Calcium, Total: 8.9 mg/dL (ref 8.5–10.1)
Chloride: 102 mmol/L (ref 98–107)
Creatinine: 0.57 mg/dL — ABNORMAL LOW (ref 0.60–1.30)
EGFR (Non-African Amer.): >60
Glucose: 184 mg/dL — ABNORMAL HIGH (ref 65–99)
Osmolality: 266 (ref 275–301)
Potassium: 4 mmol/L (ref 3.5–5.1)
SGPT (ALT): 13 U/L (ref 12–78)
SODIUM: 131 mmol/L — AB (ref 136–145)
Total Protein: 7.7 g/dL (ref 6.4–8.2)

## 2013-11-09 LAB — URINALYSIS, COMPLETE
BILIRUBIN, UR: NEGATIVE
Nitrite: POSITIVE
Ph: 5 (ref 4.5–8.0)
Protein: 30
RBC,UR: 5 /HPF (ref 0–5)
SPECIFIC GRAVITY: 1.009 (ref 1.003–1.030)
WBC UR: 1419 /HPF (ref 0–5)

## 2013-11-09 LAB — CBC
HCT: 40.1 % (ref 35.0–47.0)
HGB: 13.2 g/dL (ref 12.0–16.0)
MCH: 29.5 pg (ref 26.0–34.0)
MCHC: 32.9 g/dL (ref 32.0–36.0)
MCV: 90 fL (ref 80–100)
PLATELETS: 338 10*3/uL (ref 150–440)
RBC: 4.47 10*6/uL (ref 3.80–5.20)
RDW: 12.6 % (ref 11.5–14.5)
WBC: 12.7 10*3/uL — ABNORMAL HIGH (ref 3.6–11.0)

## 2013-11-09 LAB — LIPASE, BLOOD: Lipase: 43 U/L — ABNORMAL LOW (ref 73–393)

## 2013-11-10 ENCOUNTER — Inpatient Hospital Stay: Payer: Self-pay | Admitting: Internal Medicine

## 2013-11-10 LAB — COMPREHENSIVE METABOLIC PANEL
ALK PHOS: 76 U/L
AST: 20 U/L (ref 15–37)
Albumin: 2.9 g/dL — ABNORMAL LOW (ref 3.4–5.0)
Anion Gap: 10 (ref 7–16)
BILIRUBIN TOTAL: 0.5 mg/dL (ref 0.2–1.0)
BUN: 8 mg/dL (ref 7–18)
CO2: 23 mmol/L (ref 21–32)
Calcium, Total: 8.5 mg/dL (ref 8.5–10.1)
Chloride: 107 mmol/L (ref 98–107)
Creatinine: 0.85 mg/dL (ref 0.60–1.30)
EGFR (African American): >60
Glucose: 166 mg/dL — ABNORMAL HIGH (ref 65–99)
Osmolality: 281 (ref 275–301)
POTASSIUM: 3.4 mmol/L — AB (ref 3.5–5.1)
SGPT (ALT): 11 U/L — ABNORMAL LOW (ref 12–78)
Sodium: 140 mmol/L (ref 136–145)
Total Protein: 7.1 g/dL (ref 6.4–8.2)

## 2013-11-10 LAB — CBC
HCT: 37.5 % (ref 35.0–47.0)
HGB: 12.4 g/dL (ref 12.0–16.0)
MCV: 89 fL (ref 80–100)
PLATELETS: 326 10*3/uL (ref 150–440)
RBC: 4.2 10*6/uL (ref 3.80–5.20)
WBC: 14.3 10*3/uL — AB (ref 3.6–11.0)

## 2013-11-10 LAB — URINALYSIS, COMPLETE
Bilirubin,UR: NEGATIVE
Nitrite: NEGATIVE
PH: 5 (ref 4.5–8.0)
RBC,UR: 14 /HPF (ref 0–5)
Specific Gravity: 1.008 (ref 1.003–1.030)
Squamous Epithelial: 1
WBC UR: 1704 /HPF (ref 0–5)

## 2013-11-11 LAB — CBC WITH DIFFERENTIAL/PLATELET
BASOS PCT: 0.2 %
Basophil #: 0 10*3/uL (ref 0.0–0.1)
EOS PCT: 0.2 %
Eosinophil #: 0 10*3/uL (ref 0.0–0.7)
HCT: 35.8 % (ref 35.0–47.0)
HGB: 11.5 g/dL — ABNORMAL LOW (ref 12.0–16.0)
LYMPHS ABS: 1.3 10*3/uL (ref 1.0–3.6)
LYMPHS PCT: 9.4 %
MCH: 29.2 pg (ref 26.0–34.0)
MCHC: 32.2 g/dL (ref 32.0–36.0)
MCV: 91 fL (ref 80–100)
MONOS PCT: 2 %
Monocyte #: 0.3 x10 3/mm (ref 0.2–0.9)
Neutrophil #: 11.8 10*3/uL — ABNORMAL HIGH (ref 1.4–6.5)
Neutrophil %: 88.2 %
Platelet: 317 10*3/uL (ref 150–440)
RBC: 3.95 10*6/uL (ref 3.80–5.20)
RDW: 12.6 % (ref 11.5–14.5)
WBC: 13.4 10*3/uL — ABNORMAL HIGH (ref 3.6–11.0)

## 2013-11-11 LAB — BASIC METABOLIC PANEL
Anion Gap: 7 (ref 7–16)
BUN: 5 mg/dL — ABNORMAL LOW (ref 7–18)
Calcium, Total: 8.4 mg/dL — ABNORMAL LOW (ref 8.5–10.1)
Chloride: 110 mmol/L — ABNORMAL HIGH (ref 98–107)
Co2: 22 mmol/L (ref 21–32)
Creatinine: 0.93 mg/dL (ref 0.60–1.30)
EGFR (African American): >60
EGFR (Non-African Amer.): >60
Glucose: 121 mg/dL — ABNORMAL HIGH (ref 65–99)
Osmolality: 276 (ref 275–301)
Potassium: 3.8 mmol/L (ref 3.5–5.1)
Sodium: 139 mmol/L (ref 136–145)

## 2013-11-11 LAB — URINE CULTURE

## 2013-11-11 LAB — HEMOGLOBIN A1C: Hemoglobin A1C: 8.2 % — ABNORMAL HIGH (ref 4.2–6.3)

## 2013-11-12 LAB — BASIC METABOLIC PANEL
ANION GAP: 7 (ref 7–16)
BUN: 3 mg/dL — ABNORMAL LOW (ref 7–18)
CALCIUM: 7.7 mg/dL — AB (ref 8.5–10.1)
CREATININE: 0.83 mg/dL (ref 0.60–1.30)
Chloride: 109 mmol/L — ABNORMAL HIGH (ref 98–107)
Co2: 22 mmol/L (ref 21–32)
Glucose: 179 mg/dL — ABNORMAL HIGH (ref 65–99)
Osmolality: 277 (ref 275–301)
POTASSIUM: 3.2 mmol/L — AB (ref 3.5–5.1)
Sodium: 138 mmol/L (ref 136–145)

## 2013-11-12 LAB — CBC WITH DIFFERENTIAL/PLATELET
BASOS ABS: 0 10*3/uL (ref 0.0–0.1)
Basophil %: 0.3 %
EOS PCT: 0.3 %
Eosinophil #: 0 10*3/uL (ref 0.0–0.7)
HCT: 32 % — AB (ref 35.0–47.0)
HGB: 10.6 g/dL — AB (ref 12.0–16.0)
Lymphocyte #: 0.9 10*3/uL — ABNORMAL LOW (ref 1.0–3.6)
Lymphocyte %: 7.1 %
MCH: 29.6 pg (ref 26.0–34.0)
MCHC: 33.1 g/dL (ref 32.0–36.0)
MCV: 89 fL (ref 80–100)
MONO ABS: 0.8 x10 3/mm (ref 0.2–0.9)
MONOS PCT: 6.4 %
NEUTROS ABS: 11.4 10*3/uL — AB (ref 1.4–6.5)
Neutrophil %: 85.9 %
PLATELETS: 297 10*3/uL (ref 150–440)
RBC: 3.58 10*6/uL — ABNORMAL LOW (ref 3.80–5.20)
RDW: 12.5 % (ref 11.5–14.5)
WBC: 13.3 10*3/uL — AB (ref 3.6–11.0)

## 2013-11-12 LAB — URINE CULTURE

## 2013-11-13 LAB — CBC WITH DIFFERENTIAL/PLATELET
BASOS ABS: 0 10*3/uL (ref 0.0–0.1)
Basophil %: 0.4 %
Eosinophil #: 0.1 10*3/uL (ref 0.0–0.7)
Eosinophil %: 1 %
HCT: 33.3 % — AB (ref 35.0–47.0)
HGB: 10.7 g/dL — AB (ref 12.0–16.0)
Lymphocyte #: 1.1 10*3/uL (ref 1.0–3.6)
Lymphocyte %: 9.8 %
MCH: 28.7 pg (ref 26.0–34.0)
MCHC: 32.1 g/dL (ref 32.0–36.0)
MCV: 89 fL (ref 80–100)
MONOS PCT: 6.1 %
Monocyte #: 0.7 x10 3/mm (ref 0.2–0.9)
Neutrophil #: 9.6 10*3/uL — ABNORMAL HIGH (ref 1.4–6.5)
Neutrophil %: 82.7 %
PLATELETS: 356 10*3/uL (ref 150–440)
RBC: 3.72 10*6/uL — ABNORMAL LOW (ref 3.80–5.20)
RDW: 12.6 % (ref 11.5–14.5)
WBC: 11.6 10*3/uL — ABNORMAL HIGH (ref 3.6–11.0)

## 2013-11-13 LAB — BASIC METABOLIC PANEL
Anion Gap: 8 (ref 7–16)
BUN: 4 mg/dL — AB (ref 7–18)
Calcium, Total: 8.2 mg/dL — ABNORMAL LOW (ref 8.5–10.1)
Chloride: 109 mmol/L — ABNORMAL HIGH (ref 98–107)
Co2: 24 mmol/L (ref 21–32)
Creatinine: 0.76 mg/dL (ref 0.60–1.30)
EGFR (African American): >60
EGFR (Non-African Amer.): >60
Glucose: 138 mg/dL — ABNORMAL HIGH (ref 65–99)
Osmolality: 280 (ref 275–301)
Potassium: 3.1 mmol/L — ABNORMAL LOW (ref 3.5–5.1)
Sodium: 141 mmol/L (ref 136–145)

## 2013-11-13 LAB — CLOSTRIDIUM DIFFICILE(ARMC)

## 2013-11-13 LAB — CULTURE, BLOOD (SINGLE)

## 2013-11-13 LAB — VANCOMYCIN, TROUGH: Vancomycin, Trough: 4 ug/mL — ABNORMAL LOW (ref 10–20)

## 2013-11-14 LAB — CBC WITH DIFFERENTIAL/PLATELET
Basophil #: 0 10*3/uL (ref 0.0–0.1)
Basophil %: 0.5 %
Eosinophil #: 0.1 10*3/uL (ref 0.0–0.7)
Eosinophil %: 1.4 %
HCT: 31.1 % — ABNORMAL LOW (ref 35.0–47.0)
HGB: 10.3 g/dL — ABNORMAL LOW (ref 12.0–16.0)
LYMPHS ABS: 1.6 10*3/uL (ref 1.0–3.6)
Lymphocyte %: 22 %
MCH: 29.4 pg (ref 26.0–34.0)
MCHC: 33.2 g/dL (ref 32.0–36.0)
MCV: 89 fL (ref 80–100)
Monocyte #: 0.9 x10 3/mm (ref 0.2–0.9)
Monocyte %: 12.2 %
NEUTROS ABS: 4.7 10*3/uL (ref 1.4–6.5)
Neutrophil %: 63.9 %
Platelet: 338 10*3/uL (ref 150–440)
RBC: 3.51 10*6/uL — ABNORMAL LOW (ref 3.80–5.20)
RDW: 12.7 % (ref 11.5–14.5)
WBC: 7.4 10*3/uL (ref 3.6–11.0)

## 2013-11-14 LAB — BASIC METABOLIC PANEL
ANION GAP: 8 (ref 7–16)
BUN: 2 mg/dL — ABNORMAL LOW (ref 7–18)
CHLORIDE: 107 mmol/L (ref 98–107)
CO2: 26 mmol/L (ref 21–32)
Calcium, Total: 8 mg/dL — ABNORMAL LOW (ref 8.5–10.1)
Creatinine: 0.88 mg/dL (ref 0.60–1.30)
EGFR (Non-African Amer.): >60
Glucose: 117 mg/dL — ABNORMAL HIGH (ref 65–99)
Osmolality: 278 (ref 275–301)
Potassium: 3.4 mmol/L — ABNORMAL LOW (ref 3.5–5.1)
SODIUM: 141 mmol/L (ref 136–145)

## 2013-11-14 LAB — URINE CULTURE

## 2013-11-14 LAB — VANCOMYCIN, TROUGH: Vancomycin, Trough: 10 ug/mL (ref 10–20)

## 2013-11-15 LAB — CULTURE, BLOOD (SINGLE)

## 2013-11-15 LAB — VANCOMYCIN, TROUGH: Vancomycin, Trough: 15 ug/mL (ref 10–20)

## 2013-11-20 LAB — CULTURE, BLOOD (SINGLE)

## 2013-11-24 ENCOUNTER — Emergency Department: Payer: Self-pay | Admitting: Emergency Medicine

## 2013-11-24 LAB — BASIC METABOLIC PANEL
Anion Gap: 6 — ABNORMAL LOW (ref 7–16)
BUN: 8 mg/dL (ref 7–18)
CALCIUM: 9.3 mg/dL (ref 8.5–10.1)
CHLORIDE: 103 mmol/L (ref 98–107)
Co2: 24 mmol/L (ref 21–32)
Creatinine: 0.9 mg/dL (ref 0.60–1.30)
EGFR (African American): >60
Glucose: 228 mg/dL — ABNORMAL HIGH (ref 65–99)
Osmolality: 272 (ref 275–301)
Potassium: 4.3 mmol/L (ref 3.5–5.1)
Sodium: 133 mmol/L — ABNORMAL LOW (ref 136–145)

## 2013-11-24 LAB — CBC WITH DIFFERENTIAL/PLATELET
Basophil #: 0.1 10*3/uL (ref 0.0–0.1)
Basophil %: 1.3 %
Eosinophil #: 0.1 10*3/uL (ref 0.0–0.7)
Eosinophil %: 2 %
HCT: 38.3 % (ref 35.0–47.0)
HGB: 12.6 g/dL (ref 12.0–16.0)
Lymphocyte #: 2.4 10*3/uL (ref 1.0–3.6)
Lymphocyte %: 34.1 %
MCH: 29 pg (ref 26.0–34.0)
MCHC: 32.8 g/dL (ref 32.0–36.0)
MCV: 88 fL (ref 80–100)
Monocyte #: 0.3 x10 3/mm (ref 0.2–0.9)
Monocyte %: 4.5 %
Neutrophil #: 4 10*3/uL (ref 1.4–6.5)
Neutrophil %: 58.1 %
Platelet: 599 10*3/uL — ABNORMAL HIGH (ref 150–440)
RBC: 4.34 10*6/uL (ref 3.80–5.20)
RDW: 13 % (ref 11.5–14.5)
WBC: 6.9 10*3/uL (ref 3.6–11.0)

## 2013-11-24 LAB — URINALYSIS, COMPLETE
BILIRUBIN, UR: NEGATIVE
Blood: NEGATIVE
Ketone: NEGATIVE
Nitrite: NEGATIVE
PH: 7 (ref 4.5–8.0)
Protein: NEGATIVE
RBC,UR: 1 /HPF (ref 0–5)
Specific Gravity: 1.003 (ref 1.003–1.030)
Squamous Epithelial: 4
WBC UR: 11 /HPF (ref 0–5)

## 2013-11-26 LAB — URINE CULTURE

## 2013-12-04 DIAGNOSIS — N390 Urinary tract infection, site not specified: Secondary | ICD-10-CM | POA: Insufficient documentation

## 2013-12-04 DIAGNOSIS — M792 Neuralgia and neuritis, unspecified: Secondary | ICD-10-CM | POA: Insufficient documentation

## 2013-12-04 DIAGNOSIS — F419 Anxiety disorder, unspecified: Secondary | ICD-10-CM | POA: Insufficient documentation

## 2014-01-14 ENCOUNTER — Telehealth: Payer: Self-pay | Admitting: *Deleted

## 2014-01-14 NOTE — Telephone Encounter (Signed)
[facility] and was thinking BG too high for under bolusing I explained importance of checking for ketones to determine if pump is delivering requested insulin. Also reminded her that when BG is elevated especially above 300mg /dl, that she should be rechecking BG within 2 hours of last bolus and to give injection if BG still too high. Also instructed her to recheck every 2 hours and bolus accordingly until BG back in [employer] ranges. She stated she planned to check ketones when she goes to the bathroom next and will give injection if BG unchanged.  She states she has appointment with Dr. Sharl MaKerr tomorrow at 11:00 AM

## 2014-01-25 DIAGNOSIS — Z87440 Personal history of urinary (tract) infections: Secondary | ICD-10-CM | POA: Insufficient documentation

## 2014-02-10 ENCOUNTER — Emergency Department (HOSPITAL_COMMUNITY)
Admission: EM | Admit: 2014-02-10 | Discharge: 2014-02-10 | Disposition: A | Discharge status: Home / Self Care | Payer: BC Managed Care – PPO | Attending: Emergency Medicine | Admitting: Emergency Medicine

## 2014-02-10 ENCOUNTER — Encounter (HOSPITAL_COMMUNITY): Payer: Self-pay | Admitting: Emergency Medicine

## 2014-02-10 ENCOUNTER — Emergency Department (HOSPITAL_COMMUNITY): Payer: BC Managed Care – PPO

## 2014-02-10 DIAGNOSIS — Z792 Long term (current) use of antibiotics: Secondary | ICD-10-CM | POA: Insufficient documentation

## 2014-02-10 DIAGNOSIS — N83201 Unspecified ovarian cyst, right side: Secondary | ICD-10-CM

## 2014-02-10 DIAGNOSIS — Z880 Allergy status to penicillin: Secondary | ICD-10-CM | POA: Insufficient documentation

## 2014-02-10 DIAGNOSIS — Z79899 Other long term (current) drug therapy: Secondary | ICD-10-CM | POA: Diagnosis not present

## 2014-02-10 DIAGNOSIS — N83209 Unspecified ovarian cyst, unspecified side: Secondary | ICD-10-CM | POA: Insufficient documentation

## 2014-02-10 DIAGNOSIS — G43909 Migraine, unspecified, not intractable, without status migrainosus: Secondary | ICD-10-CM | POA: Diagnosis not present

## 2014-02-10 DIAGNOSIS — R112 Nausea with vomiting, unspecified: Secondary | ICD-10-CM | POA: Diagnosis not present

## 2014-02-10 DIAGNOSIS — Z794 Long term (current) use of insulin: Secondary | ICD-10-CM | POA: Diagnosis not present

## 2014-02-10 DIAGNOSIS — Z3202 Encounter for pregnancy test, result negative: Secondary | ICD-10-CM | POA: Diagnosis not present

## 2014-02-10 DIAGNOSIS — F3289 Other specified depressive episodes: Secondary | ICD-10-CM | POA: Insufficient documentation

## 2014-02-10 DIAGNOSIS — R1032 Left lower quadrant pain: Secondary | ICD-10-CM | POA: Insufficient documentation

## 2014-02-10 DIAGNOSIS — F329 Major depressive disorder, single episode, unspecified: Secondary | ICD-10-CM | POA: Diagnosis not present

## 2014-02-10 DIAGNOSIS — E109 Type 1 diabetes mellitus without complications: Secondary | ICD-10-CM | POA: Insufficient documentation

## 2014-02-10 DIAGNOSIS — E1065 Type 1 diabetes mellitus with hyperglycemia: Secondary | ICD-10-CM

## 2014-02-10 LAB — URINALYSIS, ROUTINE W REFLEX MICROSCOPIC
Bilirubin Urine: NEGATIVE
Hgb urine dipstick: NEGATIVE
KETONES UR: 40 mg/dL — AB
LEUKOCYTES UA: NEGATIVE
Nitrite: NEGATIVE
PROTEIN: NEGATIVE mg/dL
Specific Gravity, Urine: 1.027 (ref 1.005–1.030)
UROBILINOGEN UA: 0.2 mg/dL (ref 0.0–1.0)
pH: 6 (ref 5.0–8.0)

## 2014-02-10 LAB — BLOOD GAS, VENOUS
Acid-base deficit: 3.6 mmol/L — ABNORMAL HIGH (ref 0.0–2.0)
Bicarbonate: 22.1 mEq/L (ref 20.0–24.0)
FIO2: 0.21 %
O2 Saturation: 44.3 %
PH VEN: 7.316 — AB (ref 7.250–7.300)
Patient temperature: 98.6
TCO2: 20.3 mmol/L (ref 0–100)
pCO2, Ven: 44.5 mmHg — ABNORMAL LOW (ref 45.0–50.0)

## 2014-02-10 LAB — COMPREHENSIVE METABOLIC PANEL
ALT: 14 U/L (ref 0–35)
ANION GAP: 19 — AB (ref 5–15)
AST: 17 U/L (ref 0–37)
Albumin: 4.5 g/dL (ref 3.5–5.2)
Alkaline Phosphatase: 84 U/L (ref 39–117)
BUN: 10 mg/dL (ref 6–23)
CO2: 21 mEq/L (ref 19–32)
CREATININE: 0.7 mg/dL (ref 0.50–1.10)
Calcium: 9.6 mg/dL (ref 8.4–10.5)
Chloride: 99 mEq/L (ref 96–112)
GFR calc Af Amer: >90 mL/min (ref >90)
GFR calc non Af Amer: >90 mL/min (ref >90)
GLUCOSE: 386 mg/dL — AB (ref 70–99)
Potassium: 4.3 mEq/L (ref 3.7–5.3)
Sodium: 139 mEq/L (ref 137–147)
TOTAL PROTEIN: 8.8 g/dL — AB (ref 6.0–8.3)
Total Bilirubin: 0.2 mg/dL — ABNORMAL LOW (ref 0.3–1.2)

## 2014-02-10 LAB — CBC WITH DIFFERENTIAL/PLATELET
BASOS PCT: 0 % (ref 0–1)
Basophils Absolute: 0 10*3/uL (ref 0.0–0.1)
EOS ABS: 0 10*3/uL (ref 0.0–0.7)
EOS PCT: 1 % (ref 0–5)
HCT: 41.4 % (ref 36.0–46.0)
HEMOGLOBIN: 14.2 g/dL (ref 12.0–15.0)
LYMPHS ABS: 1.7 10*3/uL (ref 0.7–4.0)
Lymphocytes Relative: 20 % (ref 12–46)
MCH: 29.6 pg (ref 26.0–34.0)
MCHC: 34.3 g/dL (ref 30.0–36.0)
MCV: 86.3 fL (ref 78.0–100.0)
MONO ABS: 0.2 10*3/uL (ref 0.1–1.0)
Monocytes Relative: 2 % — ABNORMAL LOW (ref 3–12)
Neutro Abs: 6.5 10*3/uL (ref 1.7–7.7)
Neutrophils Relative %: 77 % (ref 43–77)
Platelets: 462 10*3/uL — ABNORMAL HIGH (ref 150–400)
RBC: 4.8 MIL/uL (ref 3.87–5.11)
RDW: 14.4 % (ref 11.5–15.5)
WBC: 8.5 10*3/uL (ref 4.0–10.5)

## 2014-02-10 LAB — CBG MONITORING, ED
GLUCOSE-CAPILLARY: 371 mg/dL — AB (ref 70–99)
Glucose-Capillary: 182 mg/dL — ABNORMAL HIGH (ref 70–99)
Glucose-Capillary: 71 mg/dL (ref 70–99)

## 2014-02-10 LAB — URINE MICROSCOPIC-ADD ON

## 2014-02-10 LAB — WET PREP, GENITAL
Clue Cells Wet Prep HPF POC: NONE SEEN
Trich, Wet Prep: NONE SEEN
YEAST WET PREP: NONE SEEN

## 2014-02-10 LAB — MAGNESIUM: Magnesium: 1.9 mg/dL (ref 1.5–2.5)

## 2014-02-10 LAB — KETONES, QUALITATIVE: ACETONE BLD: NEGATIVE

## 2014-02-10 LAB — POC URINE PREG, ED: Preg Test, Ur: NEGATIVE

## 2014-02-10 LAB — LIPASE, BLOOD: LIPASE: 14 U/L (ref 11–59)

## 2014-02-10 MED ORDER — INSULIN ASPART 100 UNIT/ML ~~LOC~~ SOLN: 7.0000 [IU] | Freq: Once | SUBCUTANEOUS | Status: DC

## 2014-02-10 MED ORDER — METOCLOPRAMIDE HCL 5 MG/ML IJ SOLN
10.0000 mg | Freq: Once | INTRAMUSCULAR | Status: AC
  Administered 2014-02-10: 10 mg via INTRAVENOUS
  Filled 2014-02-10: qty 2

## 2014-02-10 MED ORDER — SODIUM CHLORIDE 0.9 % IV BOLUS (SEPSIS)
1000.0000 mL | Freq: Once | INTRAVENOUS | Status: AC
  Administered 2014-02-10: 1000 mL via INTRAVENOUS

## 2014-02-10 MED ORDER — METOCLOPRAMIDE HCL 10 MG PO TABS: 10.0000 mg | ORAL_TABLET | Freq: Four times a day (QID) | ORAL | Status: DC | PRN

## 2014-02-10 MED ORDER — OXYCODONE-ACETAMINOPHEN 5-325 MG PO TABS: 1.0000 | ORAL_TABLET | Freq: Four times a day (QID) | ORAL | Status: DC | PRN

## 2014-02-10 MED ORDER — IBUPROFEN 200 MG PO TABS
400.0000 mg | ORAL_TABLET | Freq: Once | ORAL | Status: AC
  Administered 2014-02-10: 400 mg via ORAL
  Filled 2014-02-10: qty 2

## 2014-02-10 MED ORDER — MORPHINE SULFATE 4 MG/ML IJ SOLN
4.0000 mg | Freq: Once | INTRAMUSCULAR | Status: AC
Start: 2014-02-10 — End: 2014-02-10
  Administered 2014-02-10: 4 mg via INTRAVENOUS
  Filled 2014-02-10: qty 1

## 2014-02-10 MED ORDER — ONDANSETRON 8 MG PO TBDP
8.0000 mg | ORAL_TABLET | Freq: Once | ORAL | Status: AC
  Administered 2014-02-10: 8 mg via ORAL
  Filled 2014-02-10: qty 1

## 2014-02-10 NOTE — ED Notes (Signed)
Pt states she began having diarrhea around 1830 then started vomiting around 2130. Pt states she has been unable to keep anything down and now is hyperglycemic. CBG = 371

## 2014-02-10 NOTE — Discharge Instructions (Signed)
Use reglan as prescribed, as needed for nausea. Stay well hydrated with small sips of fluids throughout the day. Follow a BRAT (banana-rice-applesauce-toast) diet as described below for the next 24-48 hours. The 'BRAT' diet is suggested, then progress to diet as tolerated as symptoms abate. Call if bloody stools, persistent diarrhea, vomiting, fever or abdominal pain. Return to ER for changing or worsening of symptoms. Follow up with your regular doctor to re-evaluate your insulin regimen. Continue to monitor your sugars throughout the day and if you develop high sugars, try drinking plenty of fluids along with your insulin regimen to help. Use norco or ibuprofen for pain, as needed.  Food Choices to Help Relieve Diarrhea When you have diarrhea, the foods you eat and your eating habits are very important. Choosing the right foods and drinks can help relieve diarrhea. Also, because diarrhea can last up to 7 days, you need to replace lost fluids and electrolytes (such as sodium, potassium, and chloride) in order to help prevent dehydration.  WHAT GENERAL GUIDELINES DO I NEED TO FOLLOW?  Slowly drink 1 cup (8 oz) of fluid for each episode of diarrhea. If you are getting enough fluid, your urine will be clear or pale yellow.  Eat starchy foods. Some good choices include white rice, white toast, pasta, low-fiber cereal, baked potatoes (without the skin), saltine crackers, and bagels.  Avoid large servings of any cooked vegetables.  Limit fruit to two servings per day. A serving is  cup or 1 small piece.  Choose foods with less than 2 g of fiber per serving.  Limit fats to less than 8 tsp (38 g) per day.  Avoid fried foods.  Eat foods that have probiotics in them. Probiotics can be found in certain dairy products.  Avoid foods and beverages that may increase the speed at which food moves through the stomach and intestines (gastrointestinal tract). Things to avoid include:  High-fiber foods, such  as dried fruit, raw fruits and vegetables, nuts, seeds, and whole grain foods.  Spicy foods and high-fat foods.  Foods and beverages sweetened with high-fructose corn syrup, honey, or sugar alcohols such as xylitol, sorbitol, and mannitol. WHAT FOODS ARE RECOMMENDED? Grains White rice. White, [place], or pita breads (fresh or toasted), including plain rolls, buns, or bagels. White pasta. Saltine, soda, or graham crackers. Pretzels. Low-fiber cereal. Cooked cereals made with water (such as cornmeal, farina, or cream cereals). Plain muffins. Matzo. Melba toast. Zwieback.  Vegetables Potatoes (without the skin). Strained tomato and vegetable juices. Most well-cooked and canned vegetables without seeds. Tender lettuce. Fruits Cooked or canned applesauce, apricots, cherries, fruit cocktail, grapefruit, peaches, pears, or plums. Fresh bananas, apples without skin, cherries, grapes, cantaloupe, grapefruit, peaches, oranges, or plums.  Meat and Other Protein Products Baked or boiled chicken. Eggs. Tofu. Fish. Seafood. Smooth peanut butter. Ground or well-cooked tender beef, ham, veal, lamb, pork, or poultry.  Dairy Plain yogurt, kefir, and unsweetened liquid yogurt. Lactose-free milk, buttermilk, or soy milk. Plain hard cheese. Beverages Sport drinks. Clear broths. Diluted fruit juices (except prune). Regular, caffeine-free sodas such as ginger ale. Water. Decaffeinated teas. Oral rehydration solutions. Sugar-free beverages not sweetened with sugar alcohols. Other Bouillon, broth, or soups made from recommended foods.  The items listed above may not be a complete list of recommended foods or beverages. Contact your dietitian for more options. WHAT FOODS ARE NOT RECOMMENDED? Grains Whole grain, whole wheat, bran, or rye breads, rolls, pastas, crackers, and cereals. Wild or brown rice. Cereals that contain more  than 2 g of fiber per serving. Corn tortillas or taco shells. Cooked or dry oatmeal. Granola.  Popcorn. Vegetables Raw vegetables. Cabbage, broccoli, Brussels sprouts, artichokes, baked beans, beet greens, corn, kale, legumes, peas, sweet potatoes, and yams. Potato skins. Cooked spinach and cabbage. Fruits Dried fruit, including raisins and dates. Raw fruits. Stewed or dried prunes. Fresh apples with skin, apricots, mangoes, pears, raspberries, and strawberries.  Meat and Other Protein Products Chunky peanut butter. Nuts and seeds. Beans and lentils. Tomasa Blase.  Dairy High-fat cheeses. Milk, chocolate milk, and beverages made with milk, such as milk shakes. Cream. Ice cream. Sweets and Desserts Sweet rolls, doughnuts, and sweet breads. Pancakes and waffles. Fats and Oils Butter. Cream sauces. Margarine. Salad oils. Plain salad dressings. Olives. Avocados.  Beverages Caffeinated beverages (such as coffee, tea, soda, or energy drinks). Alcoholic beverages. Fruit juices with pulp. Prune juice. Soft drinks sweetened with high-fructose corn syrup or sugar alcohols. Other Coconut. Hot sauce. Chili powder. Mayonnaise. Gravy. Cream-based or milk-based soups.  The items listed above may not be a complete list of foods and beverages to avoid. Contact your dietitian for more information. WHAT SHOULD I DO IF I BECOME DEHYDRATED? Diarrhea can sometimes lead to dehydration. Signs of dehydration include dark urine and dry mouth and skin. If you think you are dehydrated, you should rehydrate with an oral rehydration solution. These solutions can be purchased at pharmacies, retail stores, or online.  Drink -1 cup (120-240 mL) of oral rehydration solution each time you have an episode of diarrhea. If drinking this amount makes your diarrhea worse, try drinking smaller amounts more often. For example, drink 1-3 tsp (5-15 mL) every 5-10 minutes.  A general rule for staying hydrated is to drink 1-2 L of fluid per day. Talk to your health care provider about the specific amount you should be drinking each day.  Drink enough fluids to keep your urine clear or pale yellow. Document Released: 09/08/2003 Document Revised: 06/23/2013 Document Reviewed: 05/11/2013 [facility] Patient Information 2015 [place], [place]. This information is not intended to replace advice given to you by your health care provider. Make sure you discuss any questions you have with your health care provider.   Abdominal Pain Many things can cause belly (abdominal) pain. Most times, the belly pain is not dangerous. Many cases of belly pain can be watched and treated at home. HOME CARE   Do not take medicines that help you go poop (laxatives) unless told to by your doctor.  Only take medicine as told by your doctor.  Eat or drink as told by your doctor. Your doctor will tell you if you should be on a special diet. GET HELP IF:  You do not know what is causing your belly pain.  You have belly pain while you are sick to your stomach (nauseous) or have runny poop (diarrhea).  You have pain while you pee or poop.  Your belly pain wakes you up at night.  You have belly pain that gets worse or better when you eat.  You have belly pain that gets worse when you eat fatty foods.  You have a fever. GET HELP RIGHT AWAY IF:   The pain does not go away within 2 hours.  You keep throwing up (vomiting).  The pain changes and is only in the right or left part of the belly.  You have bloody or tarry looking poop. MAKE SURE YOU:   Understand these instructions.  Will watch your condition.  Will get help  right away if you are not doing well or get worse. Document Released: 12/05/2007 Document Revised: 06/23/2013 Document Reviewed: 02/25/2013 [facility] Patient Information 2015 [place], [place]. This information is not intended to replace advice given to you by your health care provider. Make sure you discuss any questions you have with your health care provider.  Blood Glucose Monitoring Monitoring your blood glucose (also know as  blood sugar) helps you to manage your diabetes. It also helps you and your health care provider monitor your diabetes and determine how well your treatment plan is working. WHY SHOULD YOU MONITOR YOUR BLOOD GLUCOSE?  It can help you understand how food, exercise, and medicine affect your blood glucose.  It allows you to know what your blood glucose is at any given moment. You can quickly tell if you are having low blood glucose (hypoglycemia) or high blood glucose (hyperglycemia).  It can help you and your health care provider know how to adjust your medicines.  It can help you understand how to manage an illness or adjust medicine for exercise. WHEN SHOULD YOU TEST? Your health care provider will help you decide how often you should check your blood glucose. This may depend on the type of diabetes you have, your diabetes control, or the types of medicines you are taking. Be sure to write down all of your blood glucose readings so that this information can be reviewed with your health care provider. See below for examples of testing times that your health care provider may suggest. Type 1 Diabetes  Test 4 times a day if you are in good control, using an insulin pump, or perform multiple daily injections.  If your diabetes is not well controlled or if you are sick, you may need to monitor more often.  It is a good idea to also monitor:  Before and after exercise.  Between meals and 2 hours after a meal.  Occasionally between 2:00 a.m. and 3:00 a.m. Type 2 Diabetes  It can vary with each person, but generally, if you are on insulin, test 4 times a day.  If you take medicines by mouth (orally), test 2 times a day.  If you are on a controlled diet, test once a day.  If your diabetes is not well controlled or if you are sick, you may need to monitor more often. HOW TO MONITOR YOUR BLOOD GLUCOSE Supplies Needed  Blood glucose meter.  Test strips for your meter. Each meter has its own  strips. You must use the strips that go with your own meter.  A pricking needle (lancet).  A device that holds the lancet (lancing device).  A journal or log book to write down your results. Procedure  Wash your hands with soap and water. Alcohol is not preferred.  Prick the side of your finger (not the tip) with the lancet.  Gently milk the finger until a small drop of blood appears.  Follow the instructions that come with your meter for inserting the test strip, applying blood to the strip, and using your blood glucose meter. Other Areas to Get Blood for Testing Some meters allow you to use other areas of your body (other than your finger) to test your blood. These areas are called alternative sites. The most common alternative sites are:  The forearm.  The thigh.  The back area of the lower leg.  The palm of the hand. The blood flow in these areas is slower. Therefore, the blood glucose values you get may  be delayed, and the numbers are different from what you would get from your fingers. Do not use alternative sites if you think you are having hypoglycemia. Your reading will not be accurate. Always use a finger if you are having hypoglycemia. Also, if you cannot feel your lows (hypoglycemia unawareness), always use your fingers for your blood glucose checks. ADDITIONAL TIPS FOR GLUCOSE MONITORING  Do not reuse lancets.  Always carry your supplies with you.  All blood glucose meters have a 24-hour "hotline" number to call if you have questions or need help.  Adjust (calibrate) your blood glucose meter with a control solution after finishing a few boxes of strips. BLOOD GLUCOSE RECORD KEEPING It is a good idea to keep a daily record or log of your blood glucose readings. Most glucose meters, if not all, keep your glucose records stored in the meter. Some meters come with the ability to download your records to your home computer. Keeping a record of your blood glucose readings  is especially helpful if you are wanting to look for patterns. Make notes to go along with the blood glucose readings because you might forget what happened at that exact time. Keeping good records helps you and your health care provider to work together to achieve good diabetes management.  Document Released: 06/21/2003 Document Revised: 11/02/2013 Document Reviewed: 11/10/2012 [facility] Patient Information 2015 [place], [place]. This information is not intended to replace advice given to you by your health care provider. Make sure you discuss any questions you have with your health care provider.  Correction Insulin Your health care provider has decided you need to take insulin regularly. You have been given a correction scale (also called a sliding scale) in case you need extra insulin when your blood sugar is too high (hyperglycemia). The following instructions will assist you in how to use that correction scale.  WHAT IS A CORRECTION SCALE?  When you check your blood sugar, sometimes it will be higher than your health care provider has told you it should be. You may need an extra dose of insulin to bring your blood sugar to the recommended level (also known as your goal, target, or normal level). The correction scale is prescribed by your health care provider based on your specific needs.  Your correction scale has two parts:   The first shows you a blood sugar range.   The second part tells you how much extra insulin to give yourself if your blood sugar falls within this range. You will not need an extra dose of insulin if your blood glucose is in the desired range. You should simply give yourself the normal amount of insulin that your health care provider has ordered for you.  WHY IS IT IMPORTANT TO KEEP YOUR BLOOD SUGAR LEVELS AT YOUR DESIRED LEVEL?  Keeping your blood sugar at the desired level helps to prevent long-term complications of diabetes, such as eye disease, kidney failure,  nerve damage, and other serious complications. WHAT TYPE OF INSULIN WILL YOU USE?  To help bring down blood sugar levels that are too high, your health care provider will prescribe a short-acting or a rapid-acting insulin. An example of a short-acting insulin would be regular insulin. Remember, you may also have a longer-acting insulin prescribed for you.  WHAT DO YOU NEED TO DO?   Check your blood sugar with your home blood glucose meter as recommended by your health care provider.   Using your correction scale, find the range that your blood  sugar lies in.   Look for the units of insulin that match that blood sugar range. Give yourself the dose of correction insulin your health care provider has prescribed. Always make sure you are using the right type of insulin.   Prior to the injection, make sure you have food available that you can eat in the next 15-30 minutes.   If your correction insulin is rapid acting, start eating your meal within 15 minutes after you have given yourself the insulin injection. If you wait longer than 15 minutes to eat, your blood sugar might get too low.   If your correction insulin is short acting(regular), start eating your meal within 30 minutes after you have given yourself the insulin injection. If you wait longer than 30 minutes to eat, your blood sugar might get too low. Symptoms of low blood sugar (hypoglycemia) may include feeling shaky or weak, sweating, feeling confused, difficulty seeing, agitation, crankiness, or numbness of the lips or tongue. Check your blood sugar immediately and treat your results as directed by your health care provider.   Keep a log of your blood sugar results with the time you took the test and the amount of insulin that you injected. This information will help your health care provider manage your medicines.   Note on your log anything that may affect your blood sugar level, such as:   Changes in normal exercise or  activity.   Changes in your normal schedule, such as staying up late, going on vacation, changing your diet, or holidays.   New medicines. This includes prescription and over-the-counter medicines. Some medicines may cause high blood sugar.   Sickness, stress, or anxiety.   Changes in the time you took your medicine.   Changes in your meals, such as skipping a meal, having a late meal, or dining out.   Eating things that may affect blood glucose, such as snacks, meal portions that are larger than normal, drinks with sugar, or eating less than usual.   Ask your health care provider any questions you have.  Be aware of "stacking" your insulin doses. This happens when you correct a high blood sugar level by giving yourself extra insulin too soon after a previous correction dose or mealtime dose. You may then have too much insulin still active in your body and may be at risk for hypoglycemia. WHY DO YOU NEED A CORRECTION SCALE IF YOU HAVE NEVER BEEN DIAGNOSED WITH DIABETES?   Keeping your blood glucose in the target range is important for your overall health.   You may have been prescribed medicines that cause your blood glucose to be higher than normal. WHEN SHOULD YOU SEEK MEDICAL CARE? Contact your health care provider if:   You have experienced hypoglycemia that you are unable to treat with your usual routine.   You have a high blood sugar level that is not coming down with the correction dose.  Your blood sugar is often too low or does not come up even if you eat a fast-acting carbohydrate. Someone who lives with you should seek immediate medical care if you become unresponsive. Document Released: 11/09/2010 Document Revised: 02/18/2013 Document Reviewed: 11/28/2012 [facility] Patient Information 2015 [place], [place]. This information is not intended to replace advice given to you by your health care provider. Make sure you discuss any questions you have with your health  care provider.  Nausea and Vomiting Nausea means you feel sick to your stomach. Throwing up (vomiting) is a reflex where stomach  contents come out of your mouth. HOME CARE   Take medicine as told by your doctor.  Do not force yourself to eat. However, you do need to drink fluids.  If you feel like eating, eat a normal diet as told by your doctor.  Eat rice, wheat, potatoes, bread, lean meats, yogurt, fruits, and vegetables.  Avoid high-fat foods.  Drink enough fluids to keep your pee (urine) clear or pale yellow.  Ask your doctor how to replace body fluid losses (rehydrate). Signs of body fluid loss (dehydration) include:  Feeling very thirsty.  Dry lips and mouth.  Feeling dizzy.  Dark pee.  Peeing less than normal.  Feeling confused.  Fast breathing or heart rate. GET HELP RIGHT AWAY IF:   You have blood in your throw up.  You have black or bloody poop (stool).  You have a bad headache or stiff neck.  You feel confused.  You have bad belly (abdominal) pain.  You have chest pain or trouble breathing.  You do not pee at least once every 8 hours.  You have cold, clammy skin.  You keep throwing up after 24 to 48 hours.  You have a fever. MAKE SURE YOU:   Understand these instructions.  Will watch your condition.  Will get help right away if you are not doing well or get worse. Document Released: 12/05/2007 Document Revised: 09/10/2011 Document Reviewed: 11/17/2010 [facility] Patient Information 2015 [place]. This information is not intended to replace advice given to you by your health care provider. Make sure you discuss any questions you have with your health care provider.  Ovarian Cyst An ovarian cyst is a sac filled with fluid or blood. This sac is attached to the ovary. Some cysts go away on their own. Other cysts need treatment.  HOME CARE   Only take medicine as told by your doctor.  Follow up with your doctor as told.  Get regular  pelvic exams and Pap tests. GET HELP IF:  Your periods are late, not regular, or painful.  You stop having periods.  Your belly (abdominal) or pelvic pain does not go away.  Your belly becomes large or puffy (swollen).  You have a hard time peeing (totally emptying your bladder).  You have pressure on your bladder.  You have pain during sex.  You feel fullness, pressure, or discomfort in your belly.  You lose weight for no reason.  You feel sick most of the time.  You have a hard time pooping (constipation).  You do not feel like eating.  You develop pimples (acne).  You have an increase in hair on your body and face.  You are gaining weight for no reason.  You think you are pregnant. GET HELP RIGHT AWAY IF:   Your belly pain gets worse.  You feel sick to your stomach (nauseous), and you throw up (vomit).  You have a fever that comes on fast.  You have belly pain while pooping (bowel movement).  Your periods are heavier than usual. MAKE SURE YOU:   Understand these instructions.  Will watch your condition.  Will get help right away if you are not doing well or get worse. Document Released: 12/05/2007 Document Revised: 04/08/2013 Document Reviewed: 02/23/2013 [facility] Patient Information 2015 [place]. This information is not intended to replace advice given to you by your health care provider. Make sure you discuss any questions you have with your health care provider.

## 2014-02-10 NOTE — ED Notes (Signed)
Patient transported to Ultrasound 

## 2014-02-10 NOTE — ED Notes (Signed)
MD at bedside. EDPA MERCEDES PRESENT TO RE EVALUATE PT

## 2014-02-10 NOTE — ED Provider Notes (Signed)
CSN: [id number]     Arrival date & time 02/10/14  7829 History   First MD Initiated Contact with Patient 02/10/14 [phone]     Chief Complaint  Patient presents with  . Hyperglycemia  . Emesis     (Consider location/radiation/quality/duration/timing/severity/associated sxs/prior Treatment) HPI Comments: [name] is a 38 y.o. Female with a PMHx of DM1 on Novolog 20U 6x/d with meal coverage, depression, migraines, ovarian cysts s/p laparoscopic removal, with a PSHx of appendectomy and cholecystectomy who presents to the ED today with complaints of hyperglycemia and N/V that began last night around 9:30pm, emesis was nonbloody and nonbilious. She had one episode of loose stool at 6:30pm prior to developing the N/V, but no further loose stools since then. She's also endorsing achy intermittent nonradiating LLQ pain which has been ongoing for approx 1 month without known aggravating or alleviating factors. States that she ate shrimp last night, which is not an abnormal meal for her, and that was the last thing she ate prior to developing symptoms. States her insulin pump "fell off" so she replaced it on her thigh, but she wasn't sure if it had given her the appropriate insulin, so she checked her glucose and found it to be 391. Attempted to use 0.4U additionally but did not feel better or have improvement of glucose so she came to the ED. States she tried gingerale, diet gingerale, and crackers to help with N/V with no relief. She feels very thirsty and dry. Denies fevers, chills, CP, SOB, URI symptoms, cough, reflux, constipation, hematochezia, melena, hematemesis, changes in appearance of stool, dysuria, hematuria, vaginal discharge or itching, myalgias, arthralgias, syncope, lightheadedness, or dizziness. Continues having flatus. States her LMP 01/15/14, has Mirena IUD in place and therefore has irregular periods, last month it consisted of scant spotting. Has not had any vaginal bleeding this month or  today. Denies sick contacts or recent travel. States that she had a insulin pump years ago and has recently come back to having it, and has had some labile sugars while they've been working out the dosing regimen. Follows up closely with Pincus Large (dietician RN). Had frequent UTIs therefore she's taking macrobid prophylactically.  Patient is a 38 y.o. female presenting with hyperglycemia and vomiting. The history is provided by the patient. No language interpreter was used.  Hyperglycemia Blood sugar level PTA:  391 Severity:  Moderate Onset quality:  Gradual Duration:  12 hours Timing:  Constant Progression:  Unchanged Chronicity:  Recurrent Diabetes status:  Controlled with insulin Current diabetic therapy:  Novolog 20U 6xdaily with meal coverage Time since last antidiabetic medication:  5 hours Context: insulin pump use   Context: not change in medication, not recent change in diet and not recent illness   Relieved by:  Nothing Ineffective treatments:  Insulin (0.4 units) Associated symptoms: abdominal pain (LLQ), dehydration, increased thirst, nausea and vomiting   Associated symptoms: no blurred vision, no chest pain, no confusion, no diaphoresis, no dizziness, no dysuria, no fever, no increased appetite, no polyuria, no shortness of breath, no syncope and no weakness   Risk factors: hx of DKA   Emesis Associated symptoms: abdominal pain (LLQ)   Associated symptoms: no arthralgias, no chills, no diarrhea (one loose stool, no continued loose stool), no headaches, no myalgias and no sore throat     Past Medical History  Diagnosis Date  . Ovarian cyst   . Depression   . Cluster headache syndrome, intractable   . Migraine without aura, with intractable migraine,  so stated, without mention of status migrainosus 12/05/2012  . Type I diabetes mellitus    Past Surgical History  Procedure Laterality Date  . Appendectomy    . Cholecystectomy    . Ovarian cyst removal    .  Bunionectomy Bilateral    Family History  Problem Relation Age of Onset  . Adopted: Yes   History  Substance Use Topics  . Smoking status: Never Smoker   . Smokeless tobacco: Never Used  . Alcohol Use: Yes     Comment: 12/05/2012 "glass of wine maybe once/month"   OB History   Grav Para Term Preterm Abortions TAB SAB Ect Mult Living                 Review of Systems  Constitutional: Negative for fever, chills, diaphoresis and appetite change.  HENT: Negative for congestion, ear pain, rhinorrhea, sinus pressure and sore throat.   Eyes: Negative for blurred vision.  Respiratory: Negative for cough, chest tightness and shortness of breath.   Cardiovascular: Negative for chest pain and syncope.  Gastrointestinal: Positive for nausea, vomiting and abdominal pain (LLQ). Negative for diarrhea (one loose stool, no continued loose stool), constipation, blood in stool, abdominal distention and rectal pain.  Endocrine: Positive for polydipsia. Negative for polyuria.  Genitourinary: Positive for menstrual problem (chronically irregular due to mirena). Negative for dysuria, frequency, flank pain, decreased urine volume, vaginal bleeding, vaginal discharge, difficulty urinating, vaginal pain and pelvic pain.  Musculoskeletal: Negative for arthralgias, back pain, myalgias and neck pain.  Skin: Negative for color change.  Neurological: Negative for dizziness, syncope, weakness, light-headedness, numbness and headaches.  Psychiatric/Behavioral: Negative for confusion.  10 Systems reviewed and are negative for acute change except as noted in the HPI.    Allergies  Gluten meal; Sulfa antibiotics; Wellbutrin; Amoxicillin; and Codeine  Home Medications   Prior to Admission medications   Medication Sig Start Date End Date Taking? Authorizing Provider  acetaminophen (TYLENOL) 325 MG tablet Take 650 mg by mouth every 6 (six) hours as needed (pain).   Yes Historical Provider, MD  ARIPiprazole  (ABILIFY) 10 MG tablet Take 5 mg by mouth 2 (two) times daily.   Yes Historical Provider, MD  clonazePAM (KLONOPIN) 0.5 MG tablet Take 0.5 mg by mouth 3 (three) times daily as needed for anxiety (anxiety). For anxiety 01/09/13  Yes Fransisca Kaufmann, NP  DULoxetine (CYMBALTA) 60 MG capsule Take 60 mg by mouth daily.   Yes Historical Provider, MD  Multiple Vitamin (MULTIVITAMIN WITH MINERALS) TABS Take 1 tablet by mouth daily. 01/09/13  Yes Fransisca Kaufmann, NP  nitrofurantoin, macrocrystal-monohydrate, (MACROBID) 100 MG capsule Take 100 mg by mouth daily. For 3 months   Yes Historical Provider, MD  phenazopyridine (PYRIDIUM) 200 MG tablet Take 200 mg by mouth 3 (three) times daily as needed for pain (pain).   Yes Historical Provider, MD  zolpidem (AMBIEN) 10 MG tablet Take 1 tablet (10 mg total) by mouth at bedtime as needed for sleep. For sleep 01/09/13  Yes Fransisca Kaufmann, NP  insulin aspart (NOVOLOG) 100 UNIT/ML injection Inject 1-20 Units into the skin 6 (six) times daily. In addition to mealtime coverage. Pt subtracts 100 from measured blood sugar and divides by 35 to determine amount of insulin. 01/09/13   Fransisca Kaufmann, NP  levonorgestrel (MIRENA) 20 MCG/24HR IUD 1 each by Intrauterine route once.    Historical Provider, MD  metoCLOPramide (REGLAN) 10 MG tablet Take 1 tablet (10 mg total) by mouth every 6 (six) hours as  needed for nausea (nausea/headache). 02/10/14   Marcos Peloso Strupp Camprubi-Soms, PA-C  oxyCODONE-acetaminophen (PERCOCET) 5-325 MG per tablet Take 1-2 tablets by mouth every 6 (six) hours as needed for severe pain. 02/10/14   Kierrah Kilbride Strupp Camprubi-Soms, PA-C   BP 112/74  Pulse 106  Temp(Src) 98.3 F (36.8 C) (Oral)  Resp 16  Ht 5\' 1"  (1.549 m)  Wt 155 lb (70.308 kg)  BMI 29.30 kg/m2  SpO2 98%  LMP 12/16/2013 Physical Exam  Nursing note and vitals reviewed. Constitutional: She is oriented to person, place, and time. She appears well-developed and well-nourished. No distress.  Afebrile,  tachycardic, nontoxic  HENT:  Head: Normocephalic and atraumatic.  Mouth/Throat: Oropharynx is clear and moist. Mucous membranes are dry.  Dry mucous membranes  Eyes: Conjunctivae and EOM are normal. Pupils are equal, round, and reactive to light. Right eye exhibits no discharge. Left eye exhibits no discharge.  Neck: Normal range of motion. Neck supple.  Cardiovascular: Regular rhythm, normal heart sounds and intact distal pulses.  Tachycardia present.   No murmur heard. Pulmonary/Chest: Effort normal and breath sounds normal. No respiratory distress. She has no decreased breath sounds. She has no wheezes. She has no rhonchi. She has no rales.  CTAB in all lung fields, No kussmaul respirations  Abdominal: Soft. Normal appearance and bowel sounds are normal. She exhibits no distension. There is tenderness in the left lower quadrant. There is no rigidity, no rebound, no guarding and no CVA tenderness.  Soft, nondistended, with +BS throughout. +TTP in LLQ, no r/g/r. No CVA TTP  Genitourinary: Uterus normal. Pelvic exam was performed with patient supine. There is no rash, tenderness or lesion on the right labia. There is no rash, tenderness or lesion on the left labia. Cervix exhibits discharge. Cervix exhibits no motion tenderness and no friability. Right adnexum displays no mass, no tenderness and no fullness. Left adnexum displays tenderness (mild). Left adnexum displays no mass and no fullness. No erythema, tenderness or bleeding around the vagina. Vaginal discharge found.  No rashes, lesions, or tenderness to external genitalia. No erythema, injury, or tenderness to vaginal mucosa. Minimal amount of mucoid vaginal discharge, slightly brownish, possibly trace bleeding but no bleeding within vaginal vault. Mirena located at cervical os. No CMT or cervical friability. Mucoid discharge from cervical os, same as discharge present in vaginal vault. No R sided adnexal masses, tenderness, or fullness. Mild L  adnexal tenderness, without masses or fullness palpated. Uterus non-deviated, mobile, nonTTP, and without enlargement.    Musculoskeletal: Normal range of motion.  Moving all extremities with ease  Neurological: She is alert and oriented to person, place, and time. She has normal strength. No sensory deficit.  Skin: Skin is warm, dry and intact. No rash noted.  No rashes over exposed surfaces. Skin turgor slightly decreased.  Psychiatric: She has a normal mood and affect.    ED Course  Procedures (including critical care time) Labs Review Labs Reviewed  WET PREP, GENITAL - Abnormal; Notable for the following:    WBC, Wet Prep HPF POC MODERATE (*)    All other components within normal limits  CBC WITH DIFFERENTIAL - Abnormal; Notable for the following:    Platelets 462 (*)    Monocytes Relative 2 (*)    All other components within normal limits  COMPREHENSIVE METABOLIC PANEL - Abnormal; Notable for the following:    Glucose, Bld 386 (*)    Total Protein 8.8 (*)    Total Bilirubin 0.2 (*)    Anion gap  19 (*)    All other components within normal limits  URINALYSIS, ROUTINE W REFLEX MICROSCOPIC - Abnormal; Notable for the following:    Glucose, UA >1000 (*)    Ketones, ur 40 (*)    All other components within normal limits  URINE MICROSCOPIC-ADD ON - Abnormal; Notable for the following:    Squamous Epithelial / LPF FEW (*)    Bacteria, UA FEW (*)    All other components within normal limits  BLOOD GAS, VENOUS - Abnormal; Notable for the following:    pH, Ven 7.316 (*)    pCO2, Ven 44.5 (*)    Acid-base deficit 3.6 (*)    All other components within normal limits  CBG MONITORING, ED - Abnormal; Notable for the following:    Glucose-Capillary 182 (*)    All other components within normal limits  CBG MONITORING, ED - Abnormal; Notable for the following:    Glucose-Capillary 371 (*)    All other components within normal limits  GC/CHLAMYDIA PROBE AMP  LIPASE, BLOOD  MAGNESIUM   KETONES, QUALITATIVE  POC URINE PREG, ED  CBG MONITORING, ED    Imaging Review US Transvaginal Non-ob  02/10/2014   CLINICAL DATA:  Left-sided pelvic pain.  Assess for torsion.  EXAM: TRANSABDOMINAL AND TRANSVAGINAL ULTRASOUND OF PELVIS  DOPPLER ULTRASOUND OF OVARIES  TECHNIQUE: Both transabdominal and transvaginal ultrasound examinations of the pelvis were performed. Transabdominal technique was performed for global imaging of the pelvis including uterus, ovaries, adnexal regions, and pelvic cul-de-sac.  It was necessary to proceed with endovaginal exam following the transabdominal exam to visualize the ovaries with greater detail. Color and duplex Doppler ultrasound was utilized to evaluate blood flow to the ovaries.  COMPARISON:  CT 09/14/2013.  Ultrasound 08/03/2012.  FINDINGS: Uterus  Measurements: 8.2 x 3.0 x 4.4 cm. No fibroids or other mass visualized.  Endometrium  Thickness: 4.0 mm. IUD in place. Small amount of fluid in the endometrial canal.  Right ovary  Measurements: 3.4 x 2.2 x 2.9 cm in total. 1.8 x 1.4 x 1.6 cm complex cyst. 2.1 x 1.7 x 1.7 cm complex cyst. Normal color flow evaluation and pulse Doppler evaluation.  Left ovary  Measurements: 3.0 x 2.1 x 2.2 cm. Normal follicular cysts. Normal Doppler evaluation.  Pulsed Doppler evaluation of both ovaries demonstrates normal low-resistance arterial and venous waveforms.  Other findings  Trace amount free fluid.  IMPRESSION: IUD within the uterus.  Normal appearance of the left ovary.  Right ovary contains 2 areas measuring 1.5-2 cm that are presumed to represent complex cysts. Endometrioma or early tubo-ovarian abscess not excluded. The patient's symptoms are described on the left however. Therefore, these are probably hemorrhagic functional cysts.   Electronically Signed   By: Paulina Fusi M.D.   On: 02/10/2014 10:59   US Pelvis Complete  02/10/2014   CLINICAL DATA:  Left-sided pelvic pain.  Assess for torsion.  EXAM: TRANSABDOMINAL AND  TRANSVAGINAL ULTRASOUND OF PELVIS  DOPPLER ULTRASOUND OF OVARIES  TECHNIQUE: Both transabdominal and transvaginal ultrasound examinations of the pelvis were performed. Transabdominal technique was performed for global imaging of the pelvis including uterus, ovaries, adnexal regions, and pelvic cul-de-sac.  It was necessary to proceed with endovaginal exam following the transabdominal exam to visualize the ovaries with greater detail. Color and duplex Doppler ultrasound was utilized to evaluate blood flow to the ovaries.  COMPARISON:  CT 09/14/2013.  Ultrasound 08/03/2012.  FINDINGS: Uterus  Measurements: 8.2 x 3.0 x 4.4 cm. No fibroids or other  mass visualized.  Endometrium  Thickness: 4.0 mm. IUD in place. Small amount of fluid in the endometrial canal.  Right ovary  Measurements: 3.4 x 2.2 x 2.9 cm in total. 1.8 x 1.4 x 1.6 cm complex cyst. 2.1 x 1.7 x 1.7 cm complex cyst. Normal color flow evaluation and pulse Doppler evaluation.  Left ovary  Measurements: 3.0 x 2.1 x 2.2 cm. Normal follicular cysts. Normal Doppler evaluation.  Pulsed Doppler evaluation of both ovaries demonstrates normal low-resistance arterial and venous waveforms.  Other findings  Trace amount free fluid.  IMPRESSION: IUD within the uterus.  Normal appearance of the left ovary.  Right ovary contains 2 areas measuring 1.5-2 cm that are presumed to represent complex cysts. Endometrioma or early tubo-ovarian abscess not excluded. The patient's symptoms are described on the left however. Therefore, these are probably hemorrhagic functional cysts.   Electronically Signed   By: Paulina Fusi M.D.   On: 02/10/2014 10:59   [place] Art/ven Flow Abd Pelv Doppler  02/10/2014   CLINICAL DATA:  Left-sided pelvic pain.  Assess for torsion.  EXAM: TRANSABDOMINAL AND TRANSVAGINAL ULTRASOUND OF PELVIS  DOPPLER ULTRASOUND OF OVARIES  TECHNIQUE: Both transabdominal and transvaginal ultrasound examinations of the pelvis were performed. Transabdominal technique was  performed for global imaging of the pelvis including uterus, ovaries, adnexal regions, and pelvic cul-de-sac.  It was necessary to proceed with endovaginal exam following the transabdominal exam to visualize the ovaries with greater detail. Color and duplex Doppler ultrasound was utilized to evaluate blood flow to the ovaries.  COMPARISON:  CT 09/14/2013.  Ultrasound 08/03/2012.  FINDINGS: Uterus  Measurements: 8.2 x 3.0 x 4.4 cm. No fibroids or other mass visualized.  Endometrium  Thickness: 4.0 mm. IUD in place. Small amount of fluid in the endometrial canal.  Right ovary  Measurements: 3.4 x 2.2 x 2.9 cm in total. 1.8 x 1.4 x 1.6 cm complex cyst. 2.1 x 1.7 x 1.7 cm complex cyst. Normal color flow evaluation and pulse Doppler evaluation.  Left ovary  Measurements: 3.0 x 2.1 x 2.2 cm. Normal follicular cysts. Normal Doppler evaluation.  Pulsed Doppler evaluation of both ovaries demonstrates normal low-resistance arterial and venous waveforms.  Other findings  Trace amount free fluid.  IMPRESSION: IUD within the uterus.  Normal appearance of the left ovary.  Right ovary contains 2 areas measuring 1.5-2 cm that are presumed to represent complex cysts. Endometrioma or early tubo-ovarian abscess not excluded. The patient's symptoms are described on the left however. Therefore, these are probably hemorrhagic functional cysts.   Electronically Signed   By: Paulina Fusi M.D.   On: 02/10/2014 10:59     EKG Interpretation None      MDM   Final diagnoses:  Hyperglycemia due to type 1 diabetes mellitus  Non-intractable vomiting with nausea, vomiting of unspecified type  LLQ abdominal pain  Right ovarian cyst     38y/o type 1 diabetic with hyperglycemia and N/V beginning last night. Initial labs obtained in triage prior to exam showed CBG 371 then CMP showing CBG 386, anion gap 19, bicarb 21, Na 139, K 4.3, therefore calculated free water deficit of 0.3L. CBC w/diff WNL, lipase WNL. U/A with 40 ketones but  dirty catch with few bacteria and rare yeast, not convincing for UTI. Will obtain additional labs to assess if pt is in DKA. Will perform pelvic to assess LLQ pain, doubt need for imaging at this time but will assess need with pelvic exam. Will give 1L fluid bolus and  zofran, and check CBGs and reassess shortly.  7:45 AM Lab tech bringing blood gas to me, stating the PO2 was resulting back as "too low to read". States he repeated it several times. Pt oxygenating well, will repeat vitals and place on continuous monitoring. PH 7.318, very mildly low, pt possibly in very early DKA but given that this was a venous gas, this could still be WNL especially considering her previous labs are not convincing for DKA. Will await all labs and decide on admission after fluid bolus and possibly give insulin.   8:30 AM Pt continues to feel nauseated and having pain. Will give morphine 4mg  and reglan 10mg . Fluids running, will check CBG and give 7U SQ insulin if it continues to be elevated. Ketones neg, Mg WNL. Wet prep not showing clear source for vaginal discharge, and given mirena being inserted with some discharge and LLQ pelvic pain, will obtain transvaginal U/S to eval for torsion/TOA. Doubt need for CT at this time.   9:10 AM CBG 182. Fluids finished. Will d/c insulin, which was not given. Pt feeling improved, still slightly thirsty but will have her PO challenge. Will reassess after U/S.  11:24 AM Pt continuing to feel improved, tachycardia resolved with fluids. U/S showing no left sided ovarian or pelvic concern, right ovary showing cyst. Doubt TOA on R given lack of clinical correlation. LLQ pain could be related to one loose stool, but without any other concerning findings, doubt need for abd CT today. Pt given strict return precautions for her abd pain. Will recheck CBG, PO challenge, and d/c with rx for reglan and percocet with f/up with her PCP to manage glucose control. I explained the diagnosis and have  given explicit precautions to return to the ER including for any other new or worsening symptoms. The patient understands and accepts the medical plan as it's been dictated and I have answered their questions. Discharge instructions concerning home care and prescriptions have been given. The patient is STABLE and is discharged to home in good condition.  BP 112/74  Pulse 106  Temp(Src) 98.3 F (36.8 C) (Oral)  Resp 16  Ht 5\' 1"  (1.549 m)  Wt 155 lb (70.308 kg)  BMI 29.30 kg/m2  SpO2 98%  LMP 12/16/2013  Meds ordered this encounter  Medications  . sodium chloride 0.9 % bolus 1,000 mL    Sig:   . ondansetron (ZOFRAN-ODT) disintegrating tablet 8 mg    Sig:   . metoCLOPramide (REGLAN) injection 10 mg    Sig:   . morphine 4 MG/ML injection 4 mg    Sig:   . oxyCODONE-acetaminophen (PERCOCET) 5-325 MG per tablet    Sig: Take 1-2 tablets by mouth every 6 (six) hours as needed for severe pain.    Dispense:  10 tablet    Refill:  0    Order Specific Question:  Supervising Provider    Answer:  Eber Hong D [[id number]]  . metoCLOPramide (REGLAN) 10 MG tablet    Sig: Take 1 tablet (10 mg total) by mouth every 6 (six) hours as needed for nausea (nausea/headache).    Dispense:  6 tablet    Refill:  0    Order Specific Question:  Supervising Provider    Answer:  Eber Hong D [[id number]]     Santia Labate Strupp Camprubi-Soms, PA-C 02/10/14 1210

## 2014-02-10 NOTE — ED Notes (Signed)
Initial Contact - pt A+Ox4, resting on stretcher, pt given PO challenge and tolerating well at this time.  Pt denies pain, nausea or other complaints at this time.  Skin PWD.  MAEI, self repositioning for comfort.  Speaking full/clear sentences.  NAD.

## 2014-02-11 LAB — GC/CHLAMYDIA PROBE AMP
CT Probe RNA: NEGATIVE
GC Probe RNA: NEGATIVE

## 2014-02-11 NOTE — ED Provider Notes (Signed)
Medical screening examination/treatment/procedure(s) were performed by non-physician practitioner and as supervising physician I was immediately available for consultation/collaboration.   Dione Boozeavid Patti Shorb, MD 02/11/14 [phone]

## 2014-04-01 ENCOUNTER — Telehealth: Payer: Self-pay | Admitting: *Deleted

## 2014-04-01 NOTE — Telephone Encounter (Signed)
Patient reporting low BG after meals now that she is taking Symlin again. Current Carb ratio is 1 unit / 12 grams. Suggested a change to 15 - 18 grams. She plans to try 1/15 grams initially and if BG still going too low, will change to 1/18 grams. Appointment scheduled with me on Monday, 04/05/14 to upload pump and better assess settings and BG management.

## 2014-04-05 ENCOUNTER — Encounter: Payer: BC Managed Care – PPO | Attending: Family Medicine | Admitting: *Deleted

## 2014-04-05 DIAGNOSIS — Z794 Long term (current) use of insulin: Secondary | ICD-10-CM | POA: Diagnosis not present

## 2014-04-05 DIAGNOSIS — IMO0001 Reserved for inherently not codable concepts without codable children: Secondary | ICD-10-CM

## 2014-04-05 DIAGNOSIS — E119 Type 2 diabetes mellitus without complications: Secondary | ICD-10-CM | POA: Diagnosis present

## 2014-04-05 DIAGNOSIS — Z713 Dietary counseling and surveillance: Secondary | ICD-10-CM | POA: Diagnosis not present

## 2014-04-05 NOTE — Patient Instructions (Signed)
Plan: We have softened your ISF from 25 to 30 to help prevent low BG after correcting highs Consider using Capture Option choice "other" to designate when you take Symlin IF you are not taking Symlin, change your ICR from 15 back to 12 to give more insulin for Carb choices.

## 2014-04-05 NOTE — Progress Notes (Signed)
  Pump Follow Up Progress Note  Orders received from MD giving me permission to make insulin pump adjustments for the following patient.  Reviewed blood glucose logs on 04/05/14 via: [facility] and found the following:            Hypoglycemia Hyperglycemia Comments  Overnight Period:   YES   Pre-Meal:    Breakfast YES  ISF after High   Lunch      Supper     Post-Meal: Breakfast YES  ISF after High   Lunch YES  ISF after High   Supper     Bedtime:          Comments: Average BG over past 2 weeks is 192 +/- 115 mg/dl and she is having less hypoglycemia, now down to 5% over 2 weeks. The lows she is having appear to be after correcting highs, so I plan to decrease her ISF today. Many of the high excursions appear to be after the lows, so by avoiding those, there should be fewer highs. She is taking Symlin again, I have asked her to document this on her pump using Capture Event screen of "Other" so we can evaluate her Carb ratios better. Will need stronger ICR when she omits the Symlin. Need to follow up on elevated FBG in next visit.  Pump Settings: Date: Current Date: 04/05/14  Changes in bold print   Basal Rate: Carb Ratio Sensitivity  Basal Rate: Carb Ratio Sensitivity   MN: 1.00 12 25 MN: 1.00   12   30  (-)   6 AM  1.30   6 AM 1.30      12 N: 1.50   12 PM 1.50                                         Plan: Patient to continue using Bolus Wizard for meals and corrections. She is to notifiy me if BG are below 70 or above 300 mg/dl.   Follow up: Patient to upload to [facility] for further review. 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 Not able to initiate CGM at this time.

## 2014-04-22 NOTE — Telephone Encounter (Signed)
error 

## 2014-06-10 IMAGING — CT CT ABD-PELV W/ CM
2 of 4 series · 15 of 46 positions shown, 17 images · IV contrast (isovue)
Comparison: None.

CLINICAL DATA: Nausea and vomiting with abdominal pain.

EXAM:
CT ABDOMEN AND PELVIS WITH CONTRAST
TECHNIQUE: Multidetector CT imaging of the abdomen and pelvis was performed
using the standard protocol following bolus administration of
intravenous contrast.
CONTRAST:  100 cc Isovue 370.

[Series 2: routine abd pel with · axial · 0.71mm/px · z∈[-925,-525]mm · 12 of 96 slices shown, 14 images]
[im 8/96  soft-tissue]
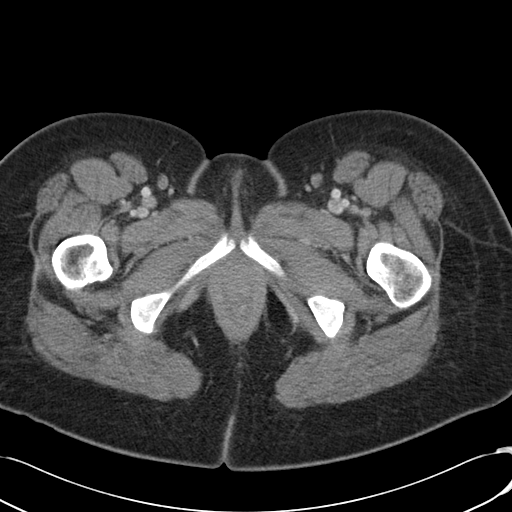
[im 8/96  bone]
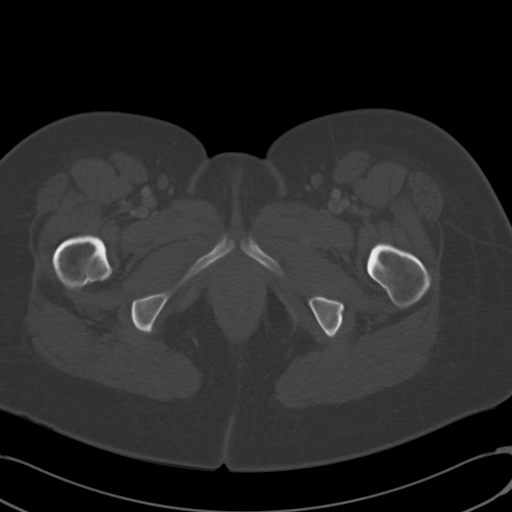
[im 15/96  soft-tissue]
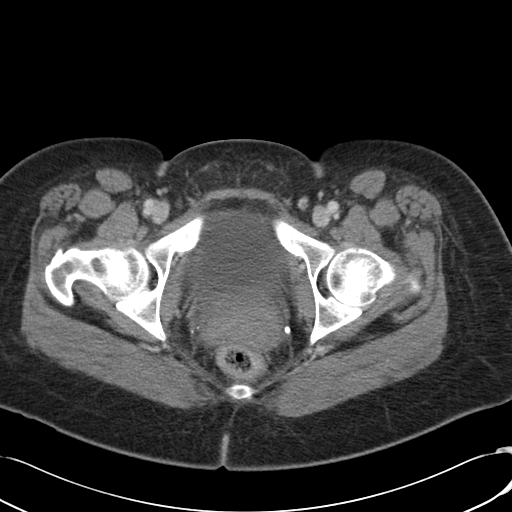
[im 22/96  soft-tissue]
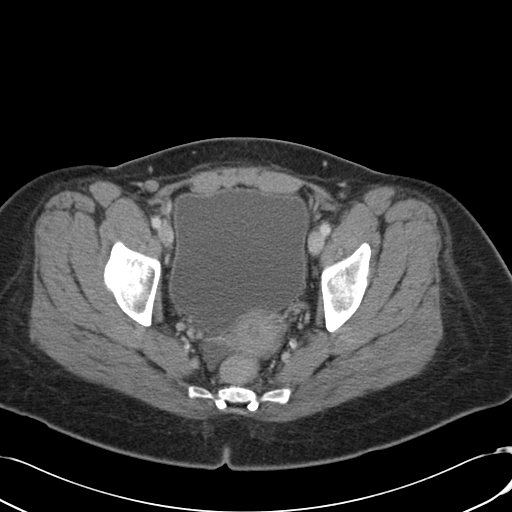
[im 30/96  soft-tissue]
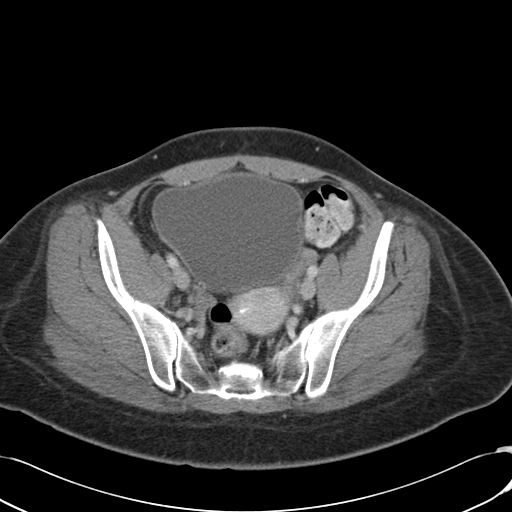
[im 37/96  soft-tissue]
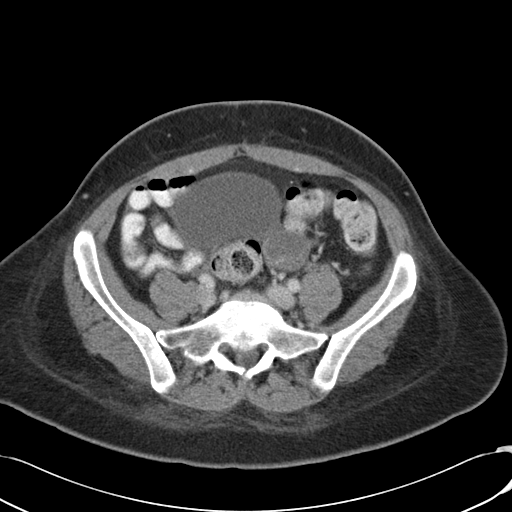
[im 44/96  soft-tissue]
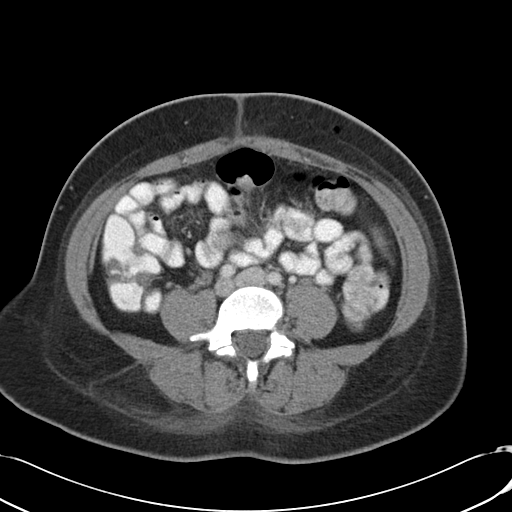
[im 52/96  soft-tissue]
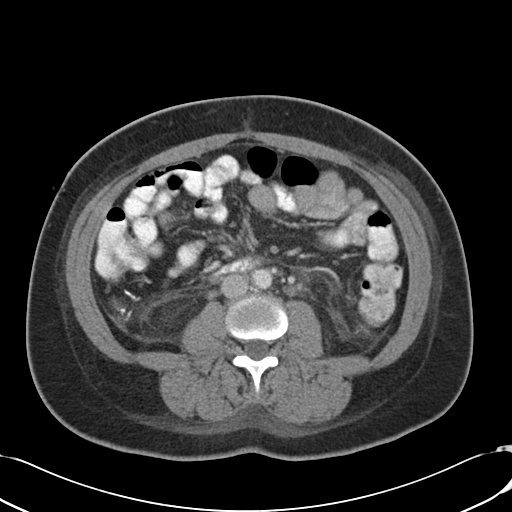
[im 59/96  soft-tissue]
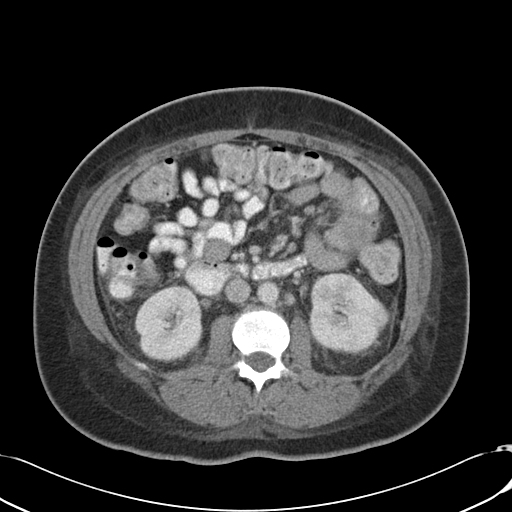
[im 66/96  soft-tissue]
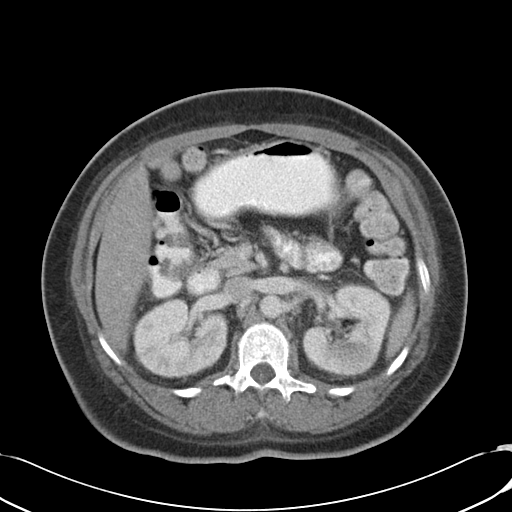
[im 66/96  bone]
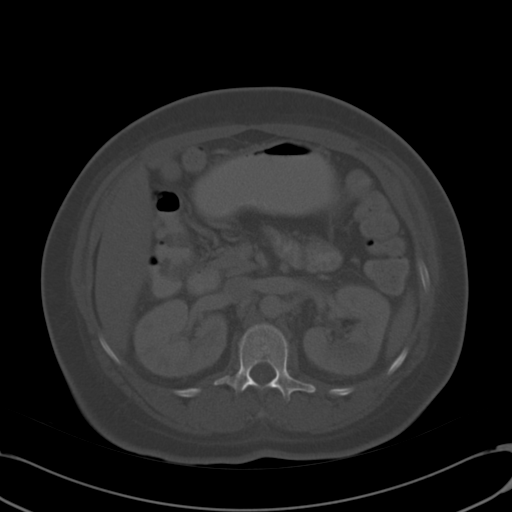
[im 74/96  soft-tissue]
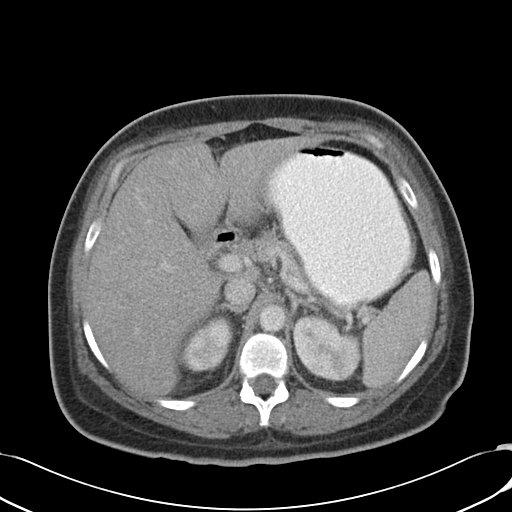
[im 81/96  soft-tissue]
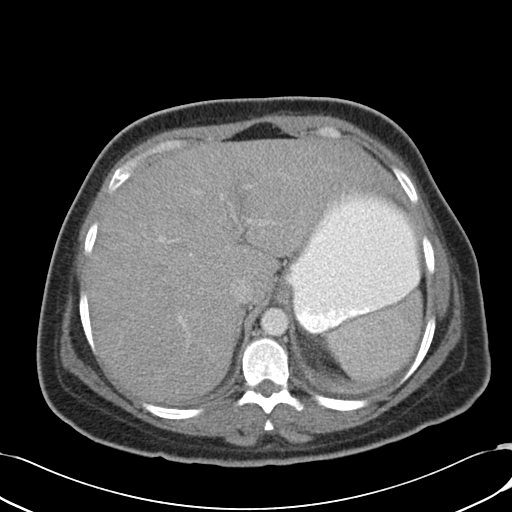
[im 88/96  soft-tissue]
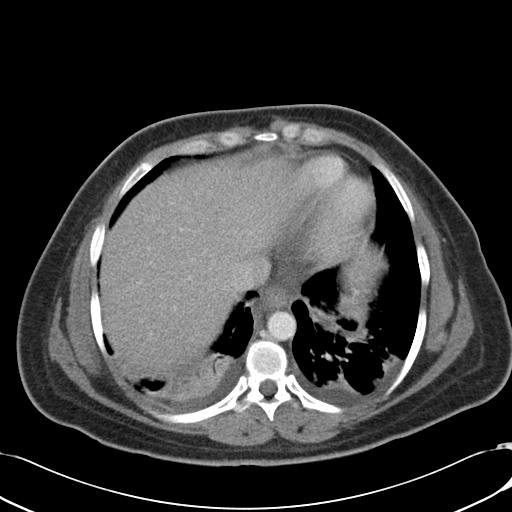

[Series 5: cor routine abd pel with · coronal · 0.70mm/px · 3 of 139 slices shown]
[im 47/139  soft-tissue]
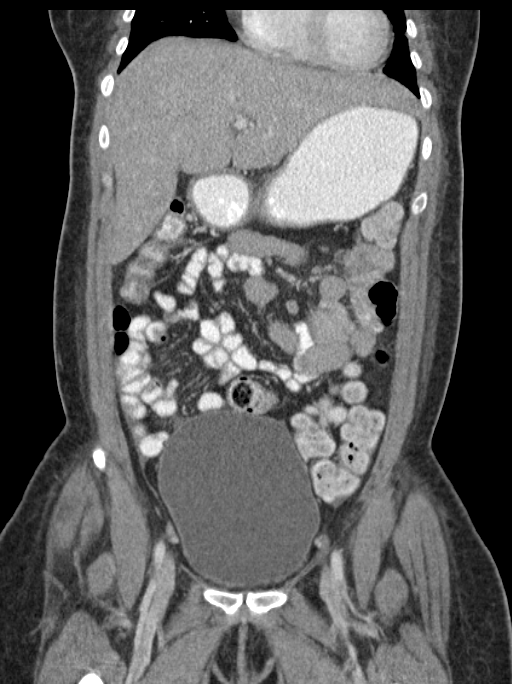
[im 62/139  soft-tissue]
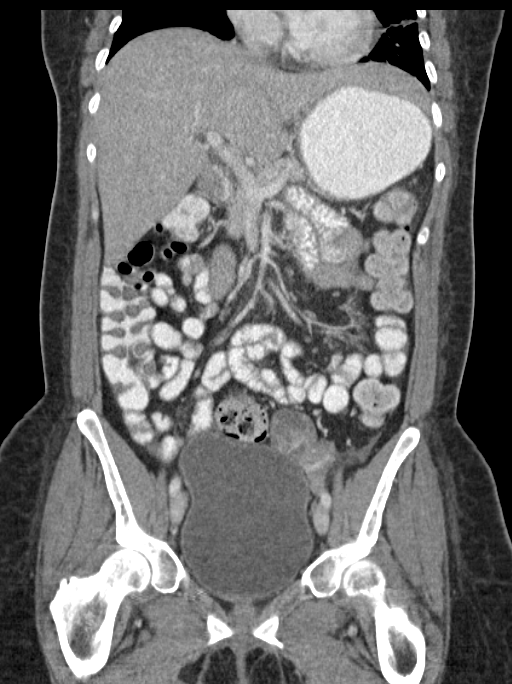
[im 77/139  soft-tissue]
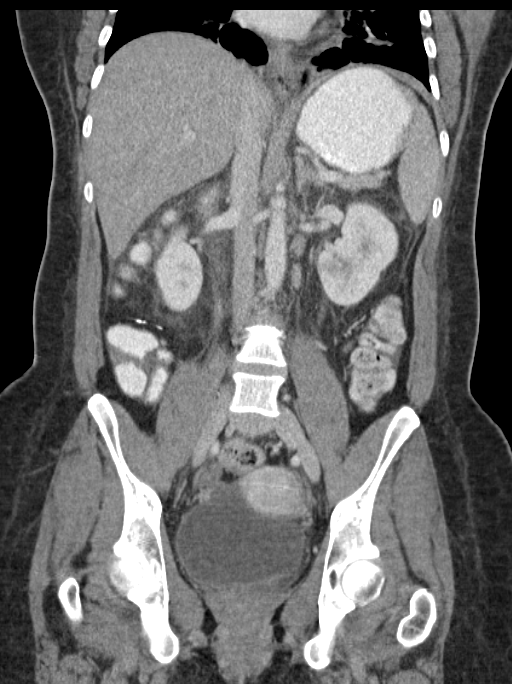

[15 of 46 positions shown; findings below may reference images not displayed]

FINDINGS: Lung Bases: There is bibasilar collapse/consolidation with small
bilateral pleural effusions.

Liver:  No focal abnormality.

Spleen: Normal

Stomach: Distended with oral contrast material. No wall thickening.
No obstructing lesion is suggest gastric outlet obstruction.

Pancreas: Normal.  No ductal dilatation.

Gallbladder/Biliary Tree: Gallbladder is surgically absent. There is
no intra or extrahepatic biliary duct dilatation.

Kidneys/Adrenals: No adrenal nodule or mass. No hydronephrosis.
Areas of segmental hypoperfusion are seen in the kidneys
bilaterally. There is bilateral perinephric edema/ inflammation.

Bowel Loops: The duodenum is unremarkable. Small bowel loops are
nondilated. No evidence for small bowel wall thickening. Terminal
ileum is normal. The appendix is not visualized, there is no
appendix identified an apparent suture material at the cecal tip
suggest prior appendectomy. No appreciable diverticular disease in
the colon. No diverticulitis.

Nodes: No evidence for gastrohepatic or hepatoduodenal ligament
lymphadenopathy. No retroperitoneal lymphadenopathy in the abdomen.
There is no evidence for pelvic sidewall lymphadenopathy.

Vasculature: No abdominal aortic aneurysm.

Pelvic Genitourinary: Urinary bladder is distended but otherwise
unremarkable. IUD is visualized in the uterus. 2.9 cm cyst or
dominant follicle noted in the left ovary.

Bones/Musculoskeletal: Bone windows reveal no worrisome lytic or
sclerotic osseous lesions.

Body Wall: Gas loculation the subcutaneous fat at the left anterior
abdominal wall presumably secondary to an injection site.

Other: Trace intraperitoneal free fluid is identified in the pelvis.
IMPRESSION: 1. Bilateral lower lobe dependent collapse/consolidation with small
bilateral pleural effusions. Pneumonia is not excluded.
2. Patchy areas of segmental hypoperfusion in the kidneys
bilaterally. The CT imaging features are highly suggestive of
pyelonephritis. There is a somewhat focal rounded area of
hypoperfusion in the interpolar left kidney, but no definite
intrarenal abscess at this time.
3. Small volume intraperitoneal free fluid.

## 2014-10-23 NOTE — Discharge Summary (Signed)
PATIENT NAME:  [name], [name] MR#:  [id number] DATE OF BIRTH:  [birth year]  DATE OF ADMISSION:  07/19/2013 DATE OF DISCHARGE:  07/22/2013  DISCHARGE DIAGNOSES:  1.  Diabetic ketoacidosis.  2.  Hypoglycemia.  3.  Left retinal detachment surgery with chronic pain.  4.  Dehydration.   HISTORY OF PRESENT ILLNESS: A 39 year old female patient with history of insulin-dependent diabetes mellitus and recent left retinal detachment surgery who presented to the hospital with nausea, vomiting, abdominal pain and left eye pain. The patient was being treated for her pain with eyedrops and pain medications by her ophthalmologist but was found to be in DKA, started on insulin drip, aggressive IV fluid resuscitation, admitted to intensive care unit, later moved to the floor.   The patient has done well with good pain control. Blood sugars are improved. Her Lantus insulin was increased once she was off the insulin drip, which caused mild hypoglycemia, but on reversing back to her home doses, the patient's blood sugars are well controlled between 100 to 150.   Today on examination, the patient does not have any tachycardia. Blood pressure, heart rate are normal. Lungs sound clear. No abdominal tenderness and is being discharged home in fair condition to follow up with her ophthalmologist.   DISCHARGE MEDICATIONS:  Include: 1.  Insulin Lantus 20 units subcu 2 times a day.  2.  NovoLog insulin sliding scale.  3.  Percocet 10/325 one tablet 4 times a day as needed for pain.  4.  Prednisolone ophthalmic suspension 1% one drop to each affected eye every 1 to two hours.  5.  Atropine 1% ophthalmic solution 1 drop to left eye 2 times a day.  6.  Erythromycin ophthalmic every 12 hours to left eye.  7.  Tylenol 325 mg oral every 4 hours as needed for pain and fever.  8.  Ibuprofen 800 mg oral 3 times a day as needed for pain.   DISCHARGE INSTRUCTIONS: Carbohydrate-controlled diet. The patient has been advised to be  compliant with her insulin to prevent further DKA episodes. She will follow up with her ophthalmologist in a week.   TIME SPENT ON DAY OF DISCHARGE IN DISCHARGE ACTIVITY: Was 40 minutes.    ____________________________ Molinda BailiffSrikar R. Paitynn Mikus, MD srs:np D: 07/22/2013 16:18:35 ET T: 07/22/2013 22:19:04 ET JOB#: [id number]  cc: Wardell HeathSrikar R. Gayland Nicol, MD, <Dictator> Orie FishermanSRIKAR R Taressa Rauh MD ELECTRONICALLY SIGNED 07/28/2013 10:16

## 2014-10-23 NOTE — H&P (Signed)
PATIENT NAME:  [name], [name] MR#:  [id number] DATE OF BIRTH:  Nov 04, 1975  DATE OF ADMISSION:  11/10/2013  PRIMARY CARE PHYSICIAN: Nonlocal.   REFERRING EMERGENCY ROOM PHYSICIAN: Dr. Zenda Alpers.    CHIEF COMPLAINT: Fever, nausea, vomiting, and kidney infection.   HISTORY OF PRESENT ILLNESS: The patient is a 39 year old female with a past medical history of insulin-dependent diabetes mellitus and depression, is presenting to the ER with a chief complaint of not feeling well, fever, nausea, vomiting, and back pain. The patient was actually seen by the ER physician yesterday morning regarding the same and diagnosed with urinary tract infection. She was discharged home with a p.o. antibiotic levofloxacin. After she went home, she was not feeling well. She had a temperature of 104.5 degrees Fahrenheit, was nauseous, vomited. The patient was also having bilateral flank pain. The patient was dehydrated and concerned as she was unable to keep her p.o. medications. She was able to take just only 1 dose of levofloxacin. The patient came into the ER and her white count was elevated to 14,000. She was given fluid boluses and hospitalist team is called to admit the patient. Blood cultures were obtained. Urine cultures are ordered. She has received 1 dose of IV levofloxacin. During my examination, the patient is resting comfortably and no family members are at bedside. She has received 1 dose of morphine for pain. So patient is lethargic but arousable and answering questions. She has reported that she has been fighting with a kidney infection since February on and off. Denies any other complaints. She uses insulin pump for her insulin-dependent diabetes mellitus and the patient's urine glucose was at 184 and 166. No other complaints. The patient's heart rate was at around 150s by the time EMS arrived to bring her in and in the ER, she was running at around 130s. Subsequently during my examination, the patient's heart rate is  at around 91 to 92.    PAST MEDICAL HISTORY: Insulin-dependent diabetes mellitus on insulin pump and depression.   PAST SURGICAL HISTORY: Cholecystectomy, appendectomy, repair of a detached retina.   ALLERGIES: SHE IS ALLERGIC TO AMOXICILLIN, CODEINE, SULFA, WELLBUTRIN.   PSYCHOSOCIAL HISTORY: Lives at home, lives alone. Denies smoking occasional intake of alcohol. Denies any street drugs.   FAMILY HISTORY: Diabetes runs in her family.   HOME MEDICATIONS: She is on insulin pump with NovoLog, multivitamin 1 tablet p.o. once daily with levofloxacin 500 mg p.o. q. 24 hours started yesterday, Klonopin 0.5 mg 1 tablet p.o. 3 times a day, Cymbalta 60 mg p.o. once daily, Ambien 10 mg once daily, acetaminophen, oxycodone 325/5 one tablet p.o. q. 6 hours as needed for pain.  REVIEW OF SYSTEMS:  CONSTITUTIONAL: Complaining of fever, fatigue, weakness. Denies any weight loss or weight gain.  EYES: Denies blurry vision, double vision. No redness of the eyes.  ENT: Denies epistaxis, discharge. Denies any post nasal drip.  CARDIOVASCULAR: Denies any chest pain, palpitations, syncope.  GASTROINTESTINAL: Has nausea, vomiting, complaining of lower abdominal discomfort and pain and bouts of bilateral flank pain.  GENITOURINARY: No hemoptysis or melena.  NEUROLOGIC: Denies any vertigo, ataxia, stroke, or TIA. MUSCULOSKELETAL: Denies any arthritis, denies any gout. Complaining of back pain bilaterally. SKIN: No rashes, no lesions. ENDOCRINE: Denies polyuria, complaining of dysuria, flank pain. Denies any heat or cold intolerance.  PSYCHIATRIC: Has a long history of anxiety. No ADD or OCD. Has chronic history of depression.   PHYSICAL EXAMINATION:  VITAL SIGNS: Temperature 99.4, pulse 94, respirations 14 to 18,  blood pressure 105/55, pulse oximetry 95%. GENERAL APPEARANCE: Not in acute distress. Moderately built and nourished. HEENT: Normocephalic, atraumatic. Pupils are equally reacting to light and  accommodation. No scleral icterus. No conjunctival injection. No sinus tenderness. Dry mucous membranes.  NECK: Supple. No JVD. No thyromegaly. Range of motion is intact.  LUNGS: Clear to auscultation bilaterally. No accessory muscle usage. There is no anterior chest wall tenderness on palpation.  CARDIAC: Regular rate and rhythm but tachycardic. No murmurs. No gallops or clicks.  GASTROINTESTINAL: Soft. Bowel sounds are positive in all 4 quadrants. Minimal abdominal discomfort is present in the lower part of the abdomen. No rebound tenderness. No masses felt. Positive right and left CVA tenderness.  NEUROLOGIC: Lethargic as she has received morphine, but arousable and answers questions appropriately. Following verbal commands. Motor and sensory are intact. Reflexes are 2+.  EXTREMITIES: No edema. No cyanosis. No clubbing.  SKIN: Warm to touch. Normal turgor. No rashes, no lesions. MUSCULOSKELETAL: No joint effusion. Has bilateral CVA tenderness. No erythema.  PSYCHIATRIC: Flat mood and affect.  LABORATORIES AND IMAGING STUDIES: Urine pregnancy test is negative. Glucose 156, BUN 8, creatinine 0.85, sodium 140, potassium 3.4, chloride 107, CO2 of 23. GFR greater than 60. Anion gap is 10, serum osmolality and calcium are normal. LFTs are normal except albumin at 2.9. ALT at 11. WBC is elevated at 14.3, hemoglobin 10.4, hematocrit 37.6, platelets are 326,000, MCV 89.   URINALYSIS: Hazy  in appearance. Ketones are 1+. Specific gravity 1.008. Blood 2+, protein 100 mg/dL, nitrites negative, leukocyte esterase 3+, WBC clumps are present. Mucous is present.  A 12-lead EKG has revealed sinus tachycardia at 137 beats per minute, no acute ST-T wave changes   ASSESSMENT AND PLAN: A 39 year old Caucasian female presenting to the ER with a chief complaint of intractable nausea, vomiting, fever, abdominal discomfort and flank pain will be admitted with the following assessment and plan: 1. Acute pyelonephritis.  Failed outpatient p.o. antibiotics because of the nausea and vomiting. We will admit her to off unity telemetry. We will provide her hydration with IV fluids. We will continue IV antibiotic levofloxacin. Follow up on the urine cultures, which are pending at this time. Pain management with Percocet as needed basis.  2. Insulin-dependent diabetes mellitus. The patient is on insulin pump with Accu-Cheks running at around 160s. We will continue insulin pump for now. We will continue IV fluids for hydration. Will provide her sliding scale insulin for extra coverage.  3. Chronic history of depression. Continue Celexa.  4. We will provide her gastrointestinal and deep vein thrombosis prophylaxis.   Diagnosis and plan of care was discussed in detail with the patient. She is aware of the plan.   TOTAL TIME SPENT ON THE ADMISSION: 45 minutes.  CODE STATUS: She is full code.    ____________________________ Ramonita LabAruna Minaal Struckman, MD ag:lt/am D: 11/10/2013 03:21:45 ET T: 11/10/2013 05:59:26 ET JOB#: [id number]  cc: Ramonita LabAruna Terryl Niziolek, MD, <Dictator> Ramonita LabARUNA Cairo Agostinelli MD ELECTRONICALLY SIGNED 11/24/2013 1:47

## 2014-10-23 NOTE — Consult Note (Signed)
PATIENT NAME:  [name], [name] MR#:  [id number] DATE OF BIRTH:  07-25-1975  DATE OF CONSULTATION:  11/11/2013  REFERRING PHYSICIAN:  Dr. Luberta Mutter.  CONSULTING PHYSICIAN:  Stann Mainland. Sampson Goon, MD   REASON FOR CONSULTATION: Recurrent urinary tract infection and sepsis.   HISTORY OF PRESENT ILLNESS:  This is a very pleasant 39 year old with a 31 year history of insulin-dependent diabetes on an insulin pump. Her diabetes was well controlled and she follows with a primary care doctor in [place]. For the last 3 months, she has had recurrent  urinary infections and has had to have 6 to 7 rounds of antibiotics. She has been referred to urology at [facility] in [place] and has seen them twice. She has had any cystoscopy or other extensive evaluation. She does report history of kidney stones in the past. She was admitted May 12th with fevers, nausea, vomiting and back pain. She had been seen in the ED the day prior and diagnosed with a urinary tract infection and treated with levofloxacin. Cultures pending from that visit now show that the organism is resistant to levofloxacin. She developed temperature to 104.5 and vomited. She also was having severe back pain and felt quite dehydrated. Of note, she said she developed a cough after vomiting but had not been having any cough or shortness of breath prior. On admission, she was found to have leukocytosis. She has been treated with IV ertapenem since admission. We are consulted for further antibiotic management.    THE PATIENT DOES REPORT SHE IS ALLERGIC TO SULFA DRUGS AS WELL AS AMOXICILLIN.    As an outpatient, she has been treated with ciprofloxacin and Macrobid, but  is not sure of other antibiotics she has received.   PAST MEDICAL HISTORY: 1. Insulin-dependent diabetes since age 68, well controlled with an A1c of approximately 7 recently.  2.  Depression, well controlled.   PAST SURGICAL HISTORY:  Cholecystectomy, appendectomy and repair of  detached retina.   ALLERGIES:  CODEINE, SULFA, AMOXICILLIN AND WELLBUTRIN.   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 She lives alone. No children. Does not smoke. Drinks alcohol occasionally.   FAMILY HISTORY:  Positive for diabetes.   REVIEW OF SYSTEMS:  Eleven systems reviewed and negative except as per HPI.  ANTIBIOTICS SINCE ADMISSION:  The patient was started on ertapenem on admission. At the time of admission, apparently, she was taking levofloxacin. She also received a dose of vancomycin.   PHYSICAL EXAMINATION: VITAL SIGNS:  Temperature since admission max is 102.9. Current temperature is 98.6. Pulse 93, blood pressure 98/65, respirations 19. Sat is 93% on 2 liters.  GENERAL:  She is pleasant. She is somewhat ill-appearing. She is interactive.  HEENT:  Pupils equal, round and reactive to light and accommodation. Extraocular movements are intact. Sclerae  anicteric.  Oropharynx is clear. Mucous membranes are dry. No thrush.  NECK:  Supple.  HEART:  Regular.  LUNGS:  With decreased breath sounds at the bases.  ABDOMEN:  Soft, nontender, nondistended. She has mild CVA tenderness.  EXTREMITIES:  Trace edema bilaterally.  NEUROLOGIC:  She is alert and oriented x 3. Grossly nonfocal neuro exam.   LABORATORY DATA:  White blood count on admission is 14.4. Hemoglobin is 11.5, platelets 317. LFTs are normal except albumin is low at 3.9. Renal function is normal with a creatinine of 0.93. Blood cultures x 3, May 12, are negative. Urine culture, May 11, is growing 100,000 E. coli sensitive to [employer] other, G  TEXT>> ceftriaxone, imipenem, but resistant to levofloxacin, gentamicin, ciprofloxacin, ampicillin and Bactrim. Urinalysis on admission showed 1704 red cells. Blood culture from April 13th with gram-negative rods on 2 of 2 blood cultures.   IMAGING: A CT scan of her abdomen and pelvis shows on the lung images bibasilar collapse and consolidation with small bilateral  pleural effusions. Gallbladder is absent. Biliary system is normal. There were areas of segmental hypoperfusion in the kidneys and bilateral perinephric edema and inflammation. Also patchy areas of hypoperfusion. There was small-volume intraperitoneal free fluid.   IMPRESSION:  A 39 year old, reasonably well-controlled, with long-standing insulin-dependent diabetes admitted with urosepsis with gram-negative rods in her blood and Escherichia coli in her urine culture. She likely has pyelonephritis as the source. Her Escherichia coli is sensitive to imipenem, as well as ceftriaxone and Keflex. I suspect her pleural effusion and patchy consolidation is due to her possible aspiration with the vomiting, since she has had a mild cough since then.    RECOMMENDATIONS: 1.  Continue ertapenem, but once gram-negative in the blood is identified can tailor back. She likely could be treated with IV ceftriaxone and then transitioned to oral Keflex at discharge once she is tolerating orals.  2.  Recommend at least a 21 day course of antibiotics for this.  3.  She will need follow up with her urologist and consider cystoscopy.  4.  It may be worth considering suppressive antibiotics as well.   Thanks for the consult.  I will be glad to follow with you.   ____________________________ Stann Mainlandavid P. Sampson GoonFitzgerald, MD dpf:dmm D: 11/11/2013 21:23:48 ET T: 11/11/2013 21:50:51 ET JOB#: [id number]  cc: Stann Mainlandavid P. Sampson GoonFitzgerald, MD, <Dictator> Tijah Hane Sampson GoonFITZGERALD MD ELECTRONICALLY SIGNED 11/23/2013 20:58

## 2014-10-23 NOTE — Discharge Summary (Signed)
PATIENT NAME:  [name], [name] MR#:  [id number] DATE OF BIRTH:  [birth year]  DATE OF ADMISSION:  11/10/2013 DATE OF DISCHARGE:  11/16/2013  DISCHARGE DIAGNOSES:  1. Escherichia coli bacteremia due to urinary source.  2. Acute pyelonephritis, improving with antibiotics.   SECONDARY DIAGNOSES:  1. Diabetes.  2. Hypokalemia.  3. Depression.   CONSULTATIONS: Infectious disease, Stann MainlandDavid P. Sampson GoonFitzgerald, MD.   PROCEDURES AND RADIOLOGY:  Chest x-ray on 13th of May showed left lower lobe infiltrate.  CT scan of the abdomen and pelvis with contrast on 13th of May showed bilateral lower lobe consolidation with small bilateral pleural effusion. Small-volume intraperitoneal free fluid. No definite intrarenal abscess.   MAJOR LABORATORY PANEL:  UA on admission showed 1+ bacteria, 1419 WBCs, 3+ leukocyte esterase and positive nitrite. WBC in clumps present.  Urine culture grew more than 100,000 colonies of E. coli.  Repeat urinalysis on 12th of May showed 1704 WBCs, 2+ bacteria, 3+ leukocyte esterase and WBC in clumps.  Urine culture on 12th of May grew 80,000 colonies of E. coli.  Blood cultures x2 were negative on 12th of May.  Blood cultures on 13th of May grew E. coli in all 4 out of 4 bottles.  Repeat urine culture on 14th of May had no growth.  Stool for C. difficile was negative.  Repeat blood cultures on 17th of May were negative x2.   HISTORY AND SHORT HOSPITAL COURSE: The patient is a 39 year old female with the above-mentioned medical problems, who was admitted for fever, nausea, vomiting and possible kidney infection. Please see Dr. Rob HickmanGouru's dictated history and physical for further details. Please see interim discharge summary dictated by Dr. Luberta MutterKonidena on 17th of May for detailed course from admission until 17th of May. In brief, the patient was treated for E. coli bacteremia and acute pyelonephritis due to E. coli. She was slowly improving with IV antibiotics. Infectious disease consultation was  obtained with Dr. Sampson GoonFitzgerald, who recommended changing the antibiotic to p.o. Keflex and treat for another 2 weeks for a total of 3-week course. She was discharged home on 18th of May in stable condition.  PERTINENT PHYSICAL EXAMINATION ON THE DATE OF DISCHARGE:  VITAL SIGNS: As follows: Temperature 99.2, heart rate 88 per minute, respirations 18 per minute, blood pressure 120/77 mmHg. She was saturating 92% on room air.  CARDIOVASCULAR: S1, S2 normal. No murmurs, rubs or gallop.  LUNGS: Clear to auscultation bilaterally. No wheezing, rales, rhonchi or crepitation.  ABDOMEN: Soft, benign.  NEUROLOGIC: Nonfocal examination.  All other physical examination remained at baseline.   DISCHARGE MEDICATIONS:  1. Cymbalta 60 mg p.o. daily.  2. Ambien 10 mg p.o. at bedtime.  3. Klonopin 0.5 mg p.o. 3 times a day as needed.  4. Multivitamin once daily.  5. Insulin NovoLog sliding scale as per home regimen.  6. Loperamide 2 mg capsule 4 times a day as needed with each loose bowel movement up to 10 mg total in a day.  7. Keflex 500 mg p.o. every 8 hours for 2 more weeks.  DISCHARGE DIET: 1800 ADA.   DISCHARGE ACTIVITY: As tolerated.   DISCHARGE INSTRUCTIONS AND FOLLOWUP: The patient was instructed to follow up [facility] [place] in 1 to 2 weeks. She will need followup with [facility] in 2 to 4 weeks in [place] and with Dr. Sampson GoonFitzgerald from infectious disease in 2 weeks as an outpatient.  TOTAL TIME DISCHARGING THIS PATIENT: 55 minutes.    ____________________________ Sarina SerVipul  Margaretmary Eddy, MD vss:lb D: 11/18/2013 06:51:35 ET T: 11/18/2013 07:14:13 ET JOB#: [id number]  cc: Simrah Chatham S. Sherryll Burger, MD, <Dictator> [facility], [place] P. Sampson Goon, MD Ellamae Sia [facility] MD ELECTRONICALLY SIGNED 11/24/2013 11:46

## 2014-10-23 NOTE — H&P (Signed)
PATIENT NAME:  [name], Candice MR#:  [id number] DATE OF BIRTH:  1976-02-06  DATE OF ADMISSION:  07/19/2013  REFERRING PHYSICIAN: Dr. Carollee MassedKaminski.   PRIMARY CARE PHYSICIAN: Nonlocal.   CHIEF COMPLAINT: Nausea, vomiting.  HISTORY OF PRESENT ILLNESS: A 39 year old Caucasian female with past medical history of type 1 diabetes, originally diagnosed at age 89, on Lantus 20 units b.i.d. as well as NovoLog sliding scale presenting with nausea, vomiting, abdominal pain. She describes 2-day duration of nausea, vomiting, multiple bouts of nonbloody, nonbilious emesis without associated fevers, chills, diarrhea, constipation, or further changes in bowel habits. No hematemesis. She does, however, have associated abdominal cramping pain. Her abdominal pain, which is cramping in nature, epigastric in location, 5 out of 10 in intensity, nonradiating, worse with vomiting, no relieving factors. States unable to tolerate p.o. intake as well. 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  Recently saw [facility] yesterday, stating everything was fine. Currently, she is complaining mostly of eye pain. Epigastric pain is minimal at this time.   EMERGENCY DEPARTMENT, COURSE: Thus far, received 2 liters of normal saline IV fluid bolus, has been started on insulin drip.   REVIEW OF SYSTEMS:  CONSTITUTIONAL: Denies fever, fatigue. Positive for generalized weakness.  EYES: Denies blurred vision, double vision. Positive for eye pain.  ENT: Denies tinnitus, ear pain, hearing loss.  RESPIRATORY: Denies cough, wheeze, shortness of breath.  CARDIOVASCULAR: Denies chest pain, palpitations, edema. GASTROINTESTINAL: Positive for nausea, vomiting, abdominal pain as described above. Denies diarrhea or hematemesis.  GENITOURINARY: Denies dysuria,  hematuria. ENDOCRINE: Denies nocturia or thyroid problems.  HEMATOLOGY: Denies easy bruising, bleeding.  SKIN: Denies rash or lesion.  MUSCULOSKELETAL: Denies pain in neck, back, shoulder, knees, hips or arthritic symptoms.  NEUROLOGIC: Denies paralysis, paresthesias. PSYCHOLOGICAL:  Denies anxiety or depressive symptoms.  Otherwise, full review of systems performed by me is negative.   PAST MEDICAL HISTORY: Consistent with type 1 diabetes originally diagnosed at age 89. Has had episodes of DKA in the past, though no recent episodes. Baseline medications, Lantus 20 units b.i.d. with NovoLog sliding scale. As well as past medical history of depression and retinal detachment.   SOCIAL HISTORY: Denies any tobacco use. Occasional alcohol usage. Denies drug usage.   FAMILY HISTORY: States that she is adopted, though does know has a family history of diabetes.   ALLERGIES: AMOXICILLIN, CODEINE, SULFA DRUGS AND WELLBUTRIN.   HOME MEDICATIONS: Include Lantus 20 units b.i.d. as well as NovoLog sliding scale.   PHYSICAL EXAMINATION: VITAL SIGNS: Heart rate 131, respirations 24, blood pressure 138/82, saturating 99% on room air. Weight 70.3 kg, BMI 29.3.  GENERAL: Ill-appearing Caucasian female, currently in moderate distress secondary to pain.  HEAD: Normocephalic, atraumatic.  EYES: Pupils are equal, round, reactive to light. Extraocular muscles intact. Mild iritis in the left.  MOUTH: Dry mucosal membrane. Dentition intact. no abscesses noted EAR, NOSE, THROAT: Throat clear without exudates. No external lesions.  NECK: Supple. No thyromegaly. No nodules. No JVD.  PULMONARY: Clear to auscultation bilaterally. No wheezes or rales. No use of accessory muscles. Good respiratory effort. Chest nontender to palpation. CARDIOVASCULAR: S1, S2, tachycardic. No murmur, no edema. Pedal pulse 2+ bilaterally.  GASTROINTESTINAL: Soft, nontender, nondistended. No masses. Positive bowel sounds. No  hepatosplenomegaly.  MUSCULOSKELETAL: No swelling, clubbing, edema. Range of motion full in all extremities.  NEUROLOGICAL: Cranial nerves II  through XII intact. No gross neurological deficits. Sensation and reflexes intact.  SKIN: No ulcerations, lesions, rashes, cyanosis. Skin warm, dry, turgor intact. PSYCHOLOGICAL:  Mood and affect normal. Alert and oriented x 3. Insight and judgment intact.   LABORATORY DATA: Sodium 134, potassium 4.8, chloride 103, bicarbonate 10 with an anion gap of 21, albumin 4.3, BUN 14, creatinine 1.09, glucose 386. LFTs: Total protein 8.7, remainder within normal limits. WBC 15.9, hemoglobin 14.5, platelets 469. Urinalysis positive for glucosuria as well as 2+ ketones, negative for evidence of infection. ABG performed, pH 7.1, O2 of 142, CO2 and bicarb not documented.   ASSESSMENT AND PLAN: A 39 year old Caucasian female with history of type 1 diabetes, presenting with nausea, vomiting and abdominal pain.  1. Diabetic ketoacidosis. ICU admit,  insulin drip 0.1 units/kg per hour. DKA protocol. Received 2 liters bolus of normal saline in the Emergency Department. Continue IV fluid, normal saline with 20 mEq potassium at 250 mL an hour, Accu-Cheks q.1 hour, BMP q.4 hours when and glucose less than 200, reduce insulin drip to 0.05 units/kg per hour with changing IV fluids to D5 half normal saline with 20 mEq of potassium at 250 mL an hour, when gap closed, give subcutaneous insulin. Previously, once again, she had been 20 units of Lantus b.i.d., though; however, if her 24-hour requirements are greater, we will need to recalculate basal  dose and prior to giving medication. Stop insulin drip 1 hour after her subcutaneous injection. The patient n.p.o. until DKA improves. Unclear initiating event. Suspect possibly related to pain as the patient states medical compliance and no evidence of infection.  2. Left eye pain status post retinal reattachment surgery recently evaluated at [facility],  which said  everything was okay. Dr. Inez Pilgrim of our ophthalmology department who is  seeing the patient currently. The case discussed with him. It seems that  he is recommending mainly pain control, as well as other drops will be ordered.  3. Leukocytosis. No evidence of infection. No antibiotic coverage required  4.  vte px  subcu.    The patient is FULL CODE.   TIME SPENT: 55 minutes critical care.    ____________________________ Cletis Athens. Hower, MD dkh:sg D: 07/19/2013 22:03:00 ET T: 07/20/2013 08:16:18 ET JOB#: [id number]  cc: Cletis Athens. Hower, MD, <Dictator> DAVID Synetta Shadow MD ELECTRONICALLY SIGNED 07/21/2013 1:15

## 2015-01-24 ENCOUNTER — Emergency Department (HOSPITAL_COMMUNITY): Payer: BC Managed Care – PPO

## 2015-01-24 ENCOUNTER — Emergency Department (HOSPITAL_COMMUNITY)
Admission: EM | Admit: 2015-01-24 | Discharge: 2015-01-24 | Disposition: A | Discharge status: Home / Self Care | Payer: BC Managed Care – PPO | Attending: Emergency Medicine | Admitting: Emergency Medicine

## 2015-01-24 ENCOUNTER — Encounter (HOSPITAL_COMMUNITY): Payer: Self-pay | Admitting: Oncology

## 2015-01-24 DIAGNOSIS — M7502 Adhesive capsulitis of left shoulder: Secondary | ICD-10-CM | POA: Insufficient documentation

## 2015-01-24 DIAGNOSIS — E109 Type 1 diabetes mellitus without complications: Secondary | ICD-10-CM | POA: Insufficient documentation

## 2015-01-24 DIAGNOSIS — Z880 Allergy status to penicillin: Secondary | ICD-10-CM | POA: Insufficient documentation

## 2015-01-24 DIAGNOSIS — F329 Major depressive disorder, single episode, unspecified: Secondary | ICD-10-CM | POA: Diagnosis not present

## 2015-01-24 DIAGNOSIS — M25512 Pain in left shoulder: Secondary | ICD-10-CM | POA: Diagnosis present

## 2015-01-24 DIAGNOSIS — Z79899 Other long term (current) drug therapy: Secondary | ICD-10-CM | POA: Insufficient documentation

## 2015-01-24 DIAGNOSIS — G43909 Migraine, unspecified, not intractable, without status migrainosus: Secondary | ICD-10-CM | POA: Diagnosis not present

## 2015-01-24 DIAGNOSIS — Z794 Long term (current) use of insulin: Secondary | ICD-10-CM | POA: Diagnosis not present

## 2015-01-24 DIAGNOSIS — Z8742 Personal history of other diseases of the female genital tract: Secondary | ICD-10-CM | POA: Insufficient documentation

## 2015-01-24 MED ORDER — HYDROCODONE-ACETAMINOPHEN 5-325 MG PO TABS: 1.0000 | ORAL_TABLET | ORAL | Status: DC | PRN

## 2015-01-24 MED ORDER — DIAZEPAM 5 MG PO TABS: 5.0000 mg | ORAL_TABLET | Freq: Two times a day (BID) | ORAL | Status: DC

## 2015-01-24 MED ORDER — KETOROLAC TROMETHAMINE 60 MG/2ML IM SOLN
60.0000 mg | Freq: Once | INTRAMUSCULAR | Status: AC
Start: 2015-01-24 — End: 2015-01-24
  Administered 2015-01-24: 60 mg via INTRAMUSCULAR
  Filled 2015-01-24: qty 2

## 2015-01-24 NOTE — ED Provider Notes (Signed)
CSN: [id number]     Arrival date & time 01/24/15  0527 History   First MD Initiated Contact with Patient 01/24/15 0559     Chief Complaint  Patient presents with  . Shoulder Pain     (Consider location/radiation/quality/duration/timing/severity/associated sxs/prior Treatment) Patient is a 39 y.o. female presenting with shoulder pain. The history is provided by the patient. No language interpreter was used.  Shoulder Pain Location:  Shoulder Time since incident:  1 week Injury: no   Shoulder location:  L shoulder Pain details:    Quality:  Sharp   Radiates to:  Does not radiate   Severity:  Moderate   Onset quality:  Gradual   Duration:  1 week   Timing:  Constant   Progression:  Worsening Chronicity:  New Dislocation: no   Prior injury to area:  Yes (Prior L collar bone fx) Relieved by:  Nothing Worsened by:  Movement Ineffective treatments:  NSAIDs Associated symptoms: decreased range of motion and stiffness   Associated symptoms: no fever, no muscle weakness, no neck pain, no numbness, no swelling and no tingling   Risk factors: no concern for non-accidental trauma and no frequent fractures     Past Medical History  Diagnosis Date  . Ovarian cyst   . Depression   . Cluster headache syndrome, intractable   . Migraine without aura, with intractable migraine, so stated, without mention of status migrainosus 12/05/2012  . Type I diabetes mellitus    Past Surgical History  Procedure Laterality Date  . Appendectomy    . Cholecystectomy    . Ovarian cyst removal    . Bunionectomy Bilateral    Family History  Problem Relation Age of Onset  . Adopted: Yes   History  Substance Use Topics  . Smoking status: Never Smoker   . Smokeless tobacco: Never Used  . Alcohol Use: Yes     Comment: 12/05/2012 "glass of wine maybe once/month"   OB History    No data available      Review of Systems  Constitutional: Negative for fever.  Musculoskeletal: Positive for  arthralgias and stiffness. Negative for neck pain.  Neurological: Negative for weakness and numbness.  All other systems reviewed and are negative.   Allergies  Gluten meal; Sulfa antibiotics; Wellbutrin; Amoxicillin; and Codeine  Home Medications   Prior to Admission medications   Medication Sig Start Date End Date Taking? Authorizing Provider  acetaminophen (TYLENOL) 325 MG tablet Take 650 mg by mouth every 6 (six) hours as needed (pain).   Yes Historical Provider, MD  ARIPiprazole (ABILIFY) 10 MG tablet Take 20 mg by mouth daily.    Yes Historical Provider, MD  clonazePAM (KLONOPIN) 0.5 MG tablet Take 0.5 mg by mouth 3 (three) times daily as needed for anxiety (anxiety). For anxiety 01/09/13  Yes Thermon Leyland, NP  DULoxetine (CYMBALTA) 60 MG capsule Take 60 mg by mouth daily.   Yes Historical Provider, MD  insulin aspart (NOVOLOG) 100 UNIT/ML injection Inject 1-20 Units into the skin 6 (six) times daily. In addition to mealtime coverage. Pt subtracts 100 from measured blood sugar and divides by 35 to determine amount of insulin. Patient taking differently: Inject 40-50 Units into the skin 6 (six) times daily. In addition to mealtime coverage. Pt subtracts 100 from measured blood sugar and divides by 35 to determine amount of insulin. 01/09/13  Yes Thermon Leyland, NP  levonorgestrel (MIRENA) 20 MCG/24HR IUD 1 each by Intrauterine route once.   Yes Historical Provider,  MD  Multiple Vitamin (MULTIVITAMIN WITH MINERALS) TABS Take 1 tablet by mouth daily. 01/09/13  Yes Thermon Leyland, NP  zolpidem (AMBIEN) 10 MG tablet Take 1 tablet (10 mg total) by mouth at bedtime as needed for sleep. For sleep 01/09/13  Yes Thermon Leyland, NP  diazepam (VALIUM) 5 MG tablet Take 1 tablet (5 mg total) by mouth 2 (two) times daily. For shoulder pain/muscle spasms 01/24/15   Antony Madura, PA-C  HYDROcodone-acetaminophen (NORCO/VICODIN) 5-325 MG per tablet Take 1 tablet by mouth every 4 (four) hours as needed for severe  pain. 01/24/15   Antony Madura, PA-C  metoCLOPramide (REGLAN) 10 MG tablet Take 1 tablet (10 mg total) by mouth every 6 (six) hours as needed for nausea (nausea/headache). Patient not taking: Reported on 01/24/2015 02/10/14   Mercedes Camprubi-Soms, PA-C  nitrofurantoin, macrocrystal-monohydrate, (MACROBID) 100 MG capsule Take 100 mg by mouth daily. For 3 months    Historical Provider, MD  oxyCODONE-acetaminophen (PERCOCET) 5-325 MG per tablet Take 1-2 tablets by mouth every 6 (six) hours as needed for severe pain. Patient not taking: Reported on 01/24/2015 02/10/14   Mercedes Camprubi-Soms, PA-C  phenazopyridine (PYRIDIUM) 200 MG tablet Take 200 mg by mouth 3 (three) times daily as needed for pain (pain).    Historical Provider, MD   BP 119/84 mmHg  Pulse 98  Temp(Src) 98.3 F (36.8 C) (Oral)  Resp 16  Ht 5\' 1"  (1.549 m)  Wt 164 lb (74.39 kg)  BMI 31.00 kg/m2  SpO2 99%  LMP 01/22/2015 (Exact Date)   Physical Exam  Constitutional: She is oriented to person, place, and time. She appears well-developed and well-nourished. No distress.  Nontoxic/nonseptic appearing  HENT:  Head: Normocephalic and atraumatic.  Eyes: Conjunctivae and EOM are normal. No scleral icterus.  Neck: Normal range of motion.  Cardiovascular: Normal rate, regular rhythm and intact distal pulses.   Pulmonary/Chest: Effort normal. No respiratory distress.  Respirations even and unlabored  Musculoskeletal:       Left shoulder: She exhibits decreased range of motion (decreased AROM past 90 abduction secondary to pain), tenderness and pain. She exhibits no bony tenderness, no effusion, no crepitus, no deformity, no spasm and normal strength.       Left upper arm: Normal.  Neurological: She is alert and oriented to person, place, and time. She exhibits normal muscle tone. Coordination normal.  Sensation to light touch intact. Patient has 5/5 grip strength and strength against resistance in all major muscle groups of the LUE.   Skin: Skin is warm and dry. No rash noted. She is not diaphoretic. No erythema. No pallor.  Psychiatric: She has a normal mood and affect. Her behavior is normal.  Nursing note and vitals reviewed.   ED Course  Procedures (including critical care time) Labs Review Labs Reviewed - No data to display  Imaging Review Dg Shoulder Left  01/24/2015   CLINICAL DATA:  Left shoulder pain. Pain for 1 week, progressive and severe this morning. No known injury.  EXAM: LEFT SHOULDER - 2+ VIEW  COMPARISON:  None.  FINDINGS: No fracture or dislocation. The alignment and joint spaces are maintained. No focal soft tissue calcifications.  IMPRESSION: Negative radiographs of the left shoulder.   Electronically Signed   By: Rubye Oaks M.D.   On: 01/24/2015 06:27     EKG Interpretation None      MDM   Final diagnoses:  Adhesive capsulitis, left    39 year old female presents to the emergency department for left  shoulder pain. Physical exam is consistent with likely adhesive capsulitis. X-rays negative for fracture, dislocation, or bony deformity. Patient is neurovascularly intact. No evidence of septic joint. Patient to be managed supportively as outpatient with NSAIDs, Valium, and Norco when necessary. Patient given referral to sports medicine should symptoms persist or worsen. Return precautions discussed and provided. Patient discharged in good condition with no unaddressed concerns.   Filed Vitals:   01/24/15 0539  BP: 119/84  Pulse: 98  Temp: 98.3 F (36.8 C)  TempSrc: Oral  Resp: 16  Height:  (1.549 m)  Weight: 164 lb (74.39 kg)  SpO2: 99%       Antony Madura, PA-C 01/24/15 0645  April Palumbo, MD 01/24/15 [phone]

## 2015-01-24 NOTE — Discharge Instructions (Signed)
Alternate ice and heat to her shoulder 3-4 times per day for 15-20 minutes each time. Also recommend frequent stretching to prevent worsening symptoms. Take 600 mg ibuprofen every 6 hours for pain and inflammation. You may take this with Valium as prescribed. For severe pain, you may take Norco as prescribed. Follow-up with sports medicine if symptoms persist or worsen.  Adhesive Capsulitis Sometimes the shoulder becomes stiff and is painful to move. Some people say it feels as if the shoulder is frozen in place. Because of this, the condition is called "frozen shoulder." Its medical name is adhesive capsulitis.  The shoulder joint is made up of strong connective tissue that attaches the ball of the humerus to the shallow shoulder socket. This strong connective tissue is called the joint capsule. This tissue can become stiff and swollen. That is when adhesive capsulitis sets in. CAUSES  It is not always clear just what the cause adhesive capsulitis. Possibilities include:  Injury to the shoulder joint.  Strain. This is a repetitive injury brought about by overuse.  Lack of use. Perhaps your arm or hand was otherwise injured. It might have been in a sling for awhile. Or perhaps you were not using it to avoid pain.  Referred pain. This is a sort of trick the body plays. You feel pain in the shoulder. But, the pain actually comes from an injury somewhere else in the body.  Long-standing health problems. Several diseases can cause adhesive capsulitis. They include diabetes, heart disease, stroke, thyroid problems, rheumatoid arthritis and lung disease.  Being a women older than 40. Anyone can develop adhesive capsulitis but it is most common in women in this age group. SYMPTOMS   Pain.  It occurs when the arm is moved.  Parts of the shoulder might hurt if they are touched.  Pain is worse at night or when resting.  Soreness. It might not be strong enough to be called pain. But, the shoulder  aches.  The shoulder does not move freely.  Muscle spasms.  Trouble sleeping because of shoulder ache or pain. DIAGNOSIS  To decide if you have adhesive capsulitis, your healthcare provider will probably:  Ask about symptoms you have noticed.  Ask about your history of joint pain and anything that might have caused the pain.  Ask about your overall health.  Use hands to feel your shoulder and neck.  Ask you to move your shoulder in specific directions. This may indicate the origin of the pain.  Order imaging tests; pictures of the shoulder. They help pinpoint the source of the problem. An X-ray might be used. For more detail, an MRI is often used. An MRI details the tendons, muscles and ligaments as well as the joint. TREATMENT  Adhesive capsulitis can be treated several ways. Most treatments can be done in a clinic or in your healthcare provider's office. Be sure to discuss the different options with your caregiver. They include:  Physical therapy. You will work on specific exercises to get your shoulder moving again. The exercises usually involve stretching. A physical therapist (a caregiver with special training) can show you what to do and what not to do. The exercises will need to be done daily.  Medication.  Over-the-counter medicines may relieve pain and inflammation (the body's way of reacting to injury or infection).  Corticosteroids. These are stronger drugs to reduce pain and inflammation. They are given by injection (shots) into the shoulder joint. Frequent treatment is not recommended.  Muscle relaxants. Medication may  be prescribed to ease muscle spasms.  Treatment of underlying conditions. This means treating another condition that is causing your shoulder problem. This might be a rotator cuff (tendon) problem  Shoulder manipulation. The shoulder will be moved by your healthcare provider. You would be under general anesthesia (given a drug that puts you to sleep).  You would not feel anything. Sometimes the joint will be injected with salt water (saline) at high pressure to break down internal scarring in the joint capsule.  Surgery. This is rarely needed. It may be suggested in advanced cases after all other treatment has failed. PROGNOSIS  In time, most people recover from adhesive capsulitis. Sometimes, however, the pain goes away but full movement of the shoulder does not return.  HOME CARE INSTRUCTIONS   Take any pain medications recommended by your healthcare provider. Follow the directions carefully.  If you have physical therapy, follow through with the therapist's suggestions. Be sure you understand the exercises you will be doing. You should understand:  How often the exercises should be done.  How many times each exercise should be repeated.  How long they should be done.  What other activities you should do, or not do.  That you should warm up before doing any exercise. Just 5 to 10 minutes will help. Small, gentle movements should get your shoulder ready for more.  Avoid high-demand exercise that involves your shoulder such as throwing. This type of exercise can make pain worse.  Consider using cold packs. Cold may ease swelling and pain. Ask your healthcare provider if a cold pack might help you. If so, get directions on how and when to use them. SEEK MEDICAL CARE IF:   You have any questions about your medications.  Your pain continues to increase. Document Released: 04/15/2009 Document Revised: 09/10/2011 Document Reviewed: 04/15/2009 [facility] Patient Information 2015 [place], [place]. This information is not intended to replace advice given to you by your health care provider. Make sure you discuss any questions you have with your health care provider.

## 2015-01-24 NOTE — ED Notes (Signed)
Pt presents d/t left shoulder pain x 1 week.  Per pt pain became unbearable this morning and she was unable to sleep.  Pt describes pain as aching w/ intermittent sharp shooting pains down to her elbow.

## 2015-02-14 ENCOUNTER — Inpatient Hospital Stay (HOSPITAL_COMMUNITY)
Admission: EM | Admit: 2015-02-14 | Discharge: 2015-02-17 | DRG: 638 | Disposition: A | Discharge status: Home / Self Care | Payer: BC Managed Care – PPO | Attending: Family Medicine | Admitting: Family Medicine

## 2015-02-14 ENCOUNTER — Encounter (HOSPITAL_COMMUNITY): Payer: Self-pay

## 2015-02-14 DIAGNOSIS — M7500 Adhesive capsulitis of unspecified shoulder: Secondary | ICD-10-CM | POA: Diagnosis present

## 2015-02-14 DIAGNOSIS — E041 Nontoxic single thyroid nodule: Secondary | ICD-10-CM | POA: Diagnosis not present

## 2015-02-14 DIAGNOSIS — E1010 Type 1 diabetes mellitus with ketoacidosis without coma: Secondary | ICD-10-CM | POA: Diagnosis not present

## 2015-02-14 DIAGNOSIS — N179 Acute kidney failure, unspecified: Secondary | ICD-10-CM | POA: Diagnosis present

## 2015-02-14 DIAGNOSIS — M25512 Pain in left shoulder: Secondary | ICD-10-CM | POA: Diagnosis present

## 2015-02-14 DIAGNOSIS — F418 Other specified anxiety disorders: Secondary | ICD-10-CM | POA: Diagnosis present

## 2015-02-14 DIAGNOSIS — D72829 Elevated white blood cell count, unspecified: Secondary | ICD-10-CM | POA: Diagnosis present

## 2015-02-14 DIAGNOSIS — T85694A Other mechanical complication of insulin pump, initial encounter: Secondary | ICD-10-CM | POA: Diagnosis present

## 2015-02-14 DIAGNOSIS — Z79899 Other long term (current) drug therapy: Secondary | ICD-10-CM | POA: Diagnosis not present

## 2015-02-14 DIAGNOSIS — Z885 Allergy status to narcotic agent status: Secondary | ICD-10-CM | POA: Diagnosis not present

## 2015-02-14 DIAGNOSIS — Z79891 Long term (current) use of opiate analgesic: Secondary | ICD-10-CM | POA: Diagnosis not present

## 2015-02-14 DIAGNOSIS — X58XXXA Exposure to other specified factors, initial encounter: Secondary | ICD-10-CM | POA: Diagnosis present

## 2015-02-14 DIAGNOSIS — Z888 Allergy status to other drugs, medicaments and biological substances status: Secondary | ICD-10-CM

## 2015-02-14 DIAGNOSIS — E860 Dehydration: Secondary | ICD-10-CM | POA: Diagnosis present

## 2015-02-14 DIAGNOSIS — Z9641 Presence of insulin pump (external) (internal): Secondary | ICD-10-CM | POA: Diagnosis present

## 2015-02-14 DIAGNOSIS — Y742 Prosthetic and other implants, materials and accessory general hospital and personal-use devices associated with adverse incidents: Secondary | ICD-10-CM | POA: Diagnosis present

## 2015-02-14 DIAGNOSIS — Z791 Long term (current) use of non-steroidal anti-inflammatories (NSAID): Secondary | ICD-10-CM

## 2015-02-14 DIAGNOSIS — Z793 Long term (current) use of hormonal contraceptives: Secondary | ICD-10-CM | POA: Diagnosis not present

## 2015-02-14 DIAGNOSIS — E872 Acidosis: Secondary | ICD-10-CM | POA: Diagnosis not present

## 2015-02-14 DIAGNOSIS — Z882 Allergy status to sulfonamides status: Secondary | ICD-10-CM

## 2015-02-14 DIAGNOSIS — Z880 Allergy status to penicillin: Secondary | ICD-10-CM

## 2015-02-14 DIAGNOSIS — E1110 Type 2 diabetes mellitus with ketoacidosis without coma: Secondary | ICD-10-CM | POA: Diagnosis present

## 2015-02-14 DIAGNOSIS — E8729 Other acidosis: Secondary | ICD-10-CM | POA: Diagnosis present

## 2015-02-14 LAB — URINALYSIS, ROUTINE W REFLEX MICROSCOPIC
BILIRUBIN URINE: NEGATIVE
Glucose, UA: >1000 mg/dL — AB
Hgb urine dipstick: NEGATIVE
Ketones, ur: >80 mg/dL — AB
LEUKOCYTES UA: NEGATIVE
NITRITE: NEGATIVE
PH: 5.5 (ref 5.0–8.0)
Protein, ur: 30 mg/dL — AB
SPECIFIC GRAVITY, URINE: 1.031 — AB (ref 1.005–1.030)
Urobilinogen, UA: 0.2 mg/dL (ref 0.0–1.0)

## 2015-02-14 LAB — BASIC METABOLIC PANEL
ANION GAP: 19 — AB (ref 5–15)
ANION GAP: 10 (ref 5–15)
BUN: 16 mg/dL (ref 6–20)
BUN: 14 mg/dL (ref 6–20)
CALCIUM: 8.3 mg/dL — AB (ref 8.9–10.3)
CHLORIDE: 109 mmol/L (ref 101–111)
CO2: 8 mmol/L — ABNORMAL LOW (ref 22–32)
CO2: 11 mmol/L — ABNORMAL LOW (ref 22–32)
Calcium: 9.1 mg/dL (ref 8.9–10.3)
Chloride: 116 mmol/L — ABNORMAL HIGH (ref 101–111)
Creatinine, Ser: 1.15 mg/dL — ABNORMAL HIGH (ref 0.44–1.00)
Creatinine, Ser: 0.71 mg/dL (ref 0.44–1.00)
GFR calc Af Amer: >60 mL/min (ref >60)
GFR calc non Af Amer: 60 mL/min — ABNORMAL LOW (ref >60)
GLUCOSE: 159 mg/dL — AB (ref 65–99)
Glucose, Bld: 302 mg/dL — ABNORMAL HIGH (ref 65–99)
POTASSIUM: 4.5 mmol/L (ref 3.5–5.1)
Potassium: 4.4 mmol/L (ref 3.5–5.1)
SODIUM: 136 mmol/L (ref 135–145)
SODIUM: 137 mmol/L (ref 135–145)

## 2015-02-14 LAB — CBC
HEMATOCRIT: 48 % — AB (ref 36.0–46.0)
HEMATOCRIT: 40 % (ref 36.0–46.0)
HEMOGLOBIN: 15.9 g/dL — AB (ref 12.0–15.0)
Hemoglobin: 13.6 g/dL (ref 12.0–15.0)
MCH: 29.5 pg (ref 26.0–34.0)
MCH: 29.6 pg (ref 26.0–34.0)
MCHC: 33.1 g/dL (ref 30.0–36.0)
MCHC: 34 g/dL (ref 30.0–36.0)
MCV: 89.1 fL (ref 78.0–100.0)
MCV: 87.1 fL (ref 78.0–100.0)
Platelets: 431 10*3/uL — ABNORMAL HIGH (ref 150–400)
Platelets: 371 10*3/uL (ref 150–400)
RBC: 5.39 MIL/uL — ABNORMAL HIGH (ref 3.87–5.11)
RBC: 4.59 MIL/uL (ref 3.87–5.11)
RDW: 13 % (ref 11.5–15.5)
RDW: 13 % (ref 11.5–15.5)
WBC: 11 10*3/uL — AB (ref 4.0–10.5)
WBC: 10.1 10*3/uL (ref 4.0–10.5)

## 2015-02-14 LAB — CBG MONITORING, ED
GLUCOSE-CAPILLARY: 292 mg/dL — AB (ref 65–99)
GLUCOSE-CAPILLARY: 256 mg/dL — AB (ref 65–99)
Glucose-Capillary: 281 mg/dL — ABNORMAL HIGH (ref 65–99)
Glucose-Capillary: 205 mg/dL — ABNORMAL HIGH (ref 65–99)

## 2015-02-14 LAB — GLUCOSE, CAPILLARY
GLUCOSE-CAPILLARY: 159 mg/dL — AB (ref 65–99)
Glucose-Capillary: 166 mg/dL — ABNORMAL HIGH (ref 65–99)
Glucose-Capillary: 159 mg/dL — ABNORMAL HIGH (ref 65–99)

## 2015-02-14 LAB — URINE MICROSCOPIC-ADD ON

## 2015-02-14 LAB — MRSA PCR SCREENING: MRSA by PCR: NEGATIVE

## 2015-02-14 MED ORDER — LORAZEPAM 2 MG/ML IJ SOLN: 1.0000 mg | Freq: Four times a day (QID) | INTRAMUSCULAR | Status: DC | PRN

## 2015-02-14 MED ORDER — ENOXAPARIN SODIUM 40 MG/0.4ML ~~LOC~~ SOLN
40.0000 mg | SUBCUTANEOUS | Status: DC
  Administered 2015-02-14 – 2015-02-16 (×3): 40 mg via SUBCUTANEOUS
  Filled 2015-02-14 (×3): qty 0.4

## 2015-02-14 MED ORDER — SODIUM CHLORIDE 0.9 % IV SOLN
INTRAVENOUS | Status: AC
  Administered 2015-02-14: 999 mL/h via INTRAVENOUS

## 2015-02-14 MED ORDER — ONDANSETRON 4 MG PO TBDP
4.0000 mg | ORAL_TABLET | Freq: Once | ORAL | Status: DC
  Filled 2015-02-14: qty 1

## 2015-02-14 MED ORDER — SODIUM CHLORIDE 0.9 % IV SOLN
INTRAVENOUS | Status: DC
  Administered 2015-02-14 – 2015-02-15 (×2): 125 mL/h via INTRAVENOUS

## 2015-02-14 MED ORDER — ZOLPIDEM TARTRATE 5 MG PO TABS
5.0000 mg | ORAL_TABLET | Freq: Every evening | ORAL | Status: DC | PRN
  Administered 2015-02-14 – 2015-02-16 (×3): 5 mg via ORAL
  Filled 2015-02-14 (×3): qty 1

## 2015-02-14 MED ORDER — POTASSIUM CHLORIDE 10 MEQ/100ML IV SOLN
10.0000 meq | INTRAVENOUS | Status: AC
  Administered 2015-02-14 – 2015-02-15 (×2): 10 meq via INTRAVENOUS
  Filled 2015-02-14: qty 100

## 2015-02-14 MED ORDER — DEXTROSE-NACL 5-0.45 % IV SOLN
INTRAVENOUS | Status: DC
  Administered 2015-02-14: 125 mL/h via INTRAVENOUS

## 2015-02-14 MED ORDER — SODIUM CHLORIDE 0.9 % IV SOLN
INTRAVENOUS | Status: DC
  Filled 2015-02-14: qty 2.5

## 2015-02-14 MED ORDER — SODIUM CHLORIDE 0.9 % IV SOLN
INTRAVENOUS | Status: DC
  Administered 2015-02-14: 2 [IU]/h via INTRAVENOUS
  Filled 2015-02-14: qty 2.5

## 2015-02-14 MED ORDER — PROMETHAZINE HCL 25 MG/ML IJ SOLN
12.5000 mg | Freq: Four times a day (QID) | INTRAMUSCULAR | Status: DC | PRN
  Administered 2015-02-14: 12.5 mg via INTRAVENOUS
  Administered 2015-02-15: 25 mg via INTRAVENOUS
  Administered 2015-02-15: 12.5 mg via INTRAVENOUS
  Filled 2015-02-14 (×3): qty 1

## 2015-02-14 NOTE — ED Notes (Signed)
Pt vomiting.  Med ordered

## 2015-02-14 NOTE — ED Provider Notes (Signed)
CSN: [id number]     Arrival date & time 02/14/15  1425 History   First MD Initiated Contact with Patient 02/14/15 1629     Chief Complaint  Patient presents with  . Hyperglycemia  . Emesis     (Consider location/radiation/quality/duration/timing/severity/associated sxs/prior Treatment) Patient is a 39 y.o. female presenting with hyperglycemia.  Hyperglycemia Blood sugar level PTA:  200's Severity:  Mild Onset quality:  Gradual Duration:  1 day Timing:  Constant Chronicity:  New Current diabetic therapy:  Pump Context: insulin pump use (unsure if pump infusion set working)   Context: not change in medication, not noncompliance, not recent change in diet and not recent illness   Relieved by:  None tried Ineffective treatments:  None tried Associated symptoms: fatigue, nausea and vomiting   Associated symptoms: no abdominal pain, no chest pain, no dysuria, no fever, no increased thirst, no polyuria and no shortness of breath     Past Medical History  Diagnosis Date  . Ovarian cyst   . Depression   . Cluster headache syndrome, intractable   . Migraine without aura, with intractable migraine, so stated, without mention of status migrainosus 12/05/2012  . Type I diabetes mellitus    Past Surgical History  Procedure Laterality Date  . Appendectomy    . Cholecystectomy    . Ovarian cyst removal    . Bunionectomy Bilateral    Family History  Problem Relation Age of Onset  . Adopted: Yes   Social History  Substance Use Topics  . Smoking status: Never Smoker   . Smokeless tobacco: Never Used  . Alcohol Use: Yes     Comment: 12/05/2012 "glass of wine maybe once/month"   OB History    No data available     Review of Systems  Constitutional: Positive for fatigue. Negative for fever.  Eyes: Negative for pain.  Respiratory: Negative for cough, choking and shortness of breath.   Cardiovascular: Negative for chest pain.  Gastrointestinal: Positive for nausea and vomiting.  Negative for abdominal pain.  Endocrine: Negative for polydipsia and polyuria.  Genitourinary: Negative for dysuria.  Skin: Negative for pallor and wound.      Allergies  Gluten meal; Sulfa antibiotics; Wellbutrin; Amoxicillin; and Codeine  Home Medications   Prior to Admission medications   Medication Sig Start Date End Date Taking? Authorizing Provider  ARIPiprazole (ABILIFY) 10 MG tablet Take 20 mg by mouth daily.    Yes Historical Provider, MD  clonazePAM (KLONOPIN) 0.5 MG tablet Take 0.5 mg by mouth 3 (three) times daily as needed for anxiety (anxiety). For anxiety 01/09/13  Yes Thermon Leyland, NP  DULoxetine (CYMBALTA) 60 MG capsule Take 60 mg by mouth daily.   Yes Historical Provider, MD  Insulin Human (INSULIN PUMP) SOLN Inject into the skin continuous.   Yes Historical Provider, MD  levonorgestrel (MIRENA) 20 MCG/24HR IUD 1 each by Intrauterine route once.   Yes Historical Provider, MD  Multiple Vitamin (MULTIVITAMIN WITH MINERALS) TABS Take 1 tablet by mouth daily. 01/09/13  Yes Thermon Leyland, NP  naproxen (NAPROSYN) 500 MG tablet Take 500 mg by mouth 2 (two) times daily. 02/11/15  Yes Historical Provider, MD  traMADol (ULTRAM) 50 MG tablet Take 50 mg by mouth every 12 (twelve) hours as needed. Pain. 02/03/15  Yes Historical Provider, MD  diazepam (VALIUM) 5 MG tablet Take 1 tablet (5 mg total) by mouth 2 (two) times daily. For shoulder pain/muscle spasms Patient not taking: Reported on 02/14/2015 01/24/15   Antony Madura,  PA-C  HYDROcodone-acetaminophen (NORCO/VICODIN) 5-325 MG per tablet Take 1 tablet by mouth every 4 (four) hours as needed for severe pain. Patient not taking: Reported on 02/14/2015 01/24/15   Antony Madura, PA-C  insulin aspart (NOVOLOG) 100 UNIT/ML injection Inject 1-20 Units into the skin 6 (six) times daily. In addition to mealtime coverage. Pt subtracts 100 from measured blood sugar and divides by 35 to determine amount of insulin. Patient not taking: Reported on  02/14/2015 01/09/13   Thermon Leyland, NP  metoCLOPramide (REGLAN) 10 MG tablet Take 1 tablet (10 mg total) by mouth every 6 (six) hours as needed for nausea (nausea/headache). Patient not taking: Reported on 01/24/2015 02/10/14   Mercedes Camprubi-Soms, PA-C  oxyCODONE-acetaminophen (PERCOCET) 5-325 MG per tablet Take 1-2 tablets by mouth every 6 (six) hours as needed for severe pain. Patient not taking: Reported on 01/24/2015 02/10/14   Mercedes Camprubi-Soms, PA-C  zolpidem (AMBIEN) 10 MG tablet Take 1 tablet (10 mg total) by mouth at bedtime as needed for sleep. For sleep Patient not taking: Reported on 02/14/2015 01/09/13   Thermon Leyland, NP   BP 162/91 mmHg  Pulse 128  Temp(Src) 98.4 F (36.9 C) (Oral)  Resp 18  SpO2 100%  LMP 01/22/2015 (Exact Date) Physical Exam  Constitutional: She is oriented to person, place, and time. She appears well-developed and well-nourished.  HENT:  Head: Normocephalic and atraumatic.  Eyes: Conjunctivae and EOM are normal. Right eye exhibits no discharge. Left eye exhibits no discharge.  Cardiovascular: Regular rhythm.  Tachycardia present.   Pulmonary/Chest: Effort normal and breath sounds normal. No respiratory distress.  Abdominal: Soft. She exhibits no distension. There is no tenderness. There is no rebound.  Musculoskeletal: Normal range of motion. She exhibits no edema or tenderness.  Neurological: She is alert and oriented to person, place, and time.  Skin: Skin is warm and dry.  Nursing note and vitals reviewed.   ED Course  Procedures (including critical care time)  CRITICAL CARE Performed by: Marily Memos   Total critical care time: 32  Critical care time was exclusive of separately billable procedures and treating other patients.  Critical care was necessary to treat or prevent imminent or life-threatening deterioration.  Critical care was time spent personally by me on the following activities: development of treatment plan with patient  and/or surrogate as well as nursing, discussions with consultants, evaluation of patient's response to treatment, examination of patient, obtaining history from patient or surrogate, ordering and performing treatments and interventions, ordering and review of laboratory studies, ordering and review of radiographic studies, pulse oximetry and re-evaluation of patient's condition.   Labs Review Labs Reviewed  BASIC METABOLIC PANEL - Abnormal; Notable for the following:    CO2 8 (*)    Glucose, Bld 302 (*)    Creatinine, Ser 1.15 (*)    GFR calc non Af Amer 60 (*)    Anion gap 19 (*)    All other components within normal limits  CBC - Abnormal; Notable for the following:    WBC 11.0 (*)    RBC 5.39 (*)    Hemoglobin 15.9 (*)    HCT 48.0 (*)    Platelets 431 (*)    All other components within normal limits  URINALYSIS, ROUTINE W REFLEX MICROSCOPIC (NOT AT [facility]) - Abnormal; Notable for the following:    Specific Gravity, Urine 1.031 (*)    Glucose, UA >1000 (*)    Ketones, ur >80 (*)    Protein, ur 30 (*)  All other components within normal limits  URINE MICROSCOPIC-ADD ON - Abnormal; Notable for the following:    Squamous Epithelial / LPF FEW (*)    All other components within normal limits  CBG MONITORING, ED - Abnormal; Notable for the following:    Glucose-Capillary 281 (*)    All other components within normal limits  CBG MONITORING, ED - Abnormal; Notable for the following:    Glucose-Capillary 292 (*)    All other components within normal limits    Imaging Review No results found. I, Shakila Mak, Barbara Cower, personally reviewed and evaluated these images and lab results as part of my medical decision-making.   EKG Interpretation None      MDM   Final diagnoses:  None     DKA with emesis likely secondary to faulty infusion set. Started insulin drip and will admit. Patient is nontoxic, doubt cerebral edema. No evidence of any other illnesses as a cause for her  symptoms.  Marily Memos, MD 02/15/15 [phone]

## 2015-02-14 NOTE — ED Notes (Signed)
Pt wants to wait to see if IV is ordered. Does not want to be stuck more than one time.

## 2015-02-14 NOTE — H&P (Addendum)
[facility] History and Physical  Manvi Guilliams AOZ:[id number] DOB: Jul 29, 1975 DOA: 02/14/2015  Referring physician: Dr. Marily Memos, EDP PCP: Gretel Acre, MD  Specialists:   Chief Complaint: Elevated sugars, nausea and vomiting  HPI: [name] is a 39 y.o. female  With a history of diabetes mellitus, type I on insulin pump, thyroid nodule, that presented to the emergency department with increased blood sugars, nausea and vomiting. Patient states that this has been ongoing for approximately 2 days now. She noticed that her insulin pump was not functioning properly yesterday. She states that she change the site and the tubing. Her blood sugars were noted to be over 500 at that time. She was starting to have nausea and vomiting as well. Today, patient states her blood sugars have been in the 200 range however continues to feel very fatigued with increased nausea and vomiting. Patient is here from [place] visiting. She denies any recent illness or changes in her medications. In the emergency department, patient was noted to be tachycardic with leukocytosis. BMP showed a glucose of 302, CO2 of 8. Patient was started on insulin drip. [facility] called for admission.  Review of Systems:  Constitutional: Complains of fatigue.  HEENT: Denies photophobia, eye pain, redness, hearing loss, ear pain, congestion, sore throat, rhinorrhea, sneezing, mouth sores, trouble swallowing, neck pain, neck stiffness and tinnitus.   Respiratory: Denies SOB, DOE, cough, chest tightness,  and wheezing.   Cardiovascular: Denies chest pain, palpitations and leg swelling.  Gastrointestinal: Complains of nausea and vomiting. Genitourinary: Denies dysuria, urgency, frequency, hematuria, flank pain and difficulty urinating.  Musculoskeletal: Denies myalgias, back pain, joint swelling, arthralgias and gait problem.  Skin: Denies pallor, rash and wound.  Neurological: Complains of some lightheadedness..    Hematological: Denies adenopathy. Easy bruising, personal or family bleeding history  Psychiatric/Behavioral: Denies suicidal ideation, mood changes, confusion, nervousness, sleep disturbance and agitation  Past Medical History  Diagnosis Date  . Ovarian cyst   . Depression   . Cluster headache syndrome, intractable   . Migraine without aura, with intractable migraine, so stated, without mention of status migrainosus 12/05/2012  . Type I diabetes mellitus    Past Surgical History  Procedure Laterality Date  . Appendectomy    . Cholecystectomy    . Ovarian cyst removal    . Bunionectomy Bilateral    Social History:  reports that she has never smoked. She has never used smokeless tobacco. She reports that she drinks alcohol. She reports that she does not use illicit drugs.   Allergies  Allergen Reactions  . Gluten Meal   . Sulfa Antibiotics Nausea And Vomiting  . Wellbutrin [Bupropion] Other (See Comments)    "Makes me suicidal."  . Amoxicillin Rash  . Codeine Rash    Family History  Problem Relation Age of Onset  . Adopted: Yes    Prior to Admission medications   Medication Sig Start Date End Date Taking? Authorizing Provider  ARIPiprazole (ABILIFY) 10 MG tablet Take 20 mg by mouth daily.    Yes Historical Provider, MD  clonazePAM (KLONOPIN) 0.5 MG tablet Take 0.5 mg by mouth 3 (three) times daily as needed for anxiety (anxiety). For anxiety 01/09/13  Yes Thermon Leyland, NP  DULoxetine (CYMBALTA) 60 MG capsule Take 60 mg by mouth daily.   Yes Historical Provider, MD  Insulin Human (INSULIN PUMP) SOLN Inject into the skin continuous.   Yes Historical Provider, MD  levonorgestrel (MIRENA) 20 MCG/24HR IUD 1 each by Intrauterine route once.  Yes Historical Provider, MD  Multiple Vitamin (MULTIVITAMIN WITH MINERALS) TABS Take 1 tablet by mouth daily. 01/09/13  Yes Thermon Leyland, NP  naproxen (NAPROSYN) 500 MG tablet Take 500 mg by mouth 2 (two) times daily. 02/11/15  Yes Historical  Provider, MD  traMADol (ULTRAM) 50 MG tablet Take 50 mg by mouth every 12 (twelve) hours as needed. Pain. 02/03/15  Yes Historical Provider, MD   Physical Exam: Filed Vitals:   02/14/15 1638  BP: 162/91  Pulse: 128  Temp:   Resp: 18     General: Well developed, well nourished, NAD, appears stated age  HEENT: NCAT, PERRLA, EOMI, Anicteic Sclera, mucous membranes dry.  Neck: Supple, no JVD.  Slightly enlarged thyroid.  Cardiovascular: S1 S2 auscultated, no rubs, murmurs or gallops. Tachycardic  Respiratory: Clear to auscultation bilaterally with equal chest rise  Abdomen: Soft, nontender, nondistended, + bowel sounds  Extremities: warm dry without cyanosis clubbing or edema  Neuro: AAOx3, cranial nerves grossly intact. Strength 4/5 LUE, 5/5 RLE, RUE, LLE.  Skin: Without rashes exudates or nodules  Psych: Normal affect and demeanor with intact judgement and insight  Labs on Admission:  Basic Metabolic Panel:  Recent Labs Lab 02/14/15 1550  NA 136  K 4.5  CL 109  CO2 8*  GLUCOSE 302*  BUN 16  CREATININE 1.15*  CALCIUM 9.1   Liver Function Tests: No results for input(s): AST, ALT, ALKPHOS, BILITOT, PROT, ALBUMIN in the last 168 hours. No results for input(s): LIPASE, AMYLASE in the last 168 hours. No results for input(s): AMMONIA in the last 168 hours. CBC:  Recent Labs Lab 02/14/15 1550  WBC 11.0*  HGB 15.9*  HCT 48.0*  MCV 89.1  PLT 431*   Cardiac Enzymes: No results for input(s): CKTOTAL, CKMB, CKMBINDEX, TROPONINI in the last 168 hours.  BNP (last 3 results) No results for input(s): BNP in the last 8760 hours.  ProBNP (last 3 results) No results for input(s): PROBNP in the last 8760 hours.  CBG:  Recent Labs Lab 02/14/15 1509 02/14/15 1602  GLUCAP 281* 292*    Radiological Exams on Admission: No results found.  EKG: None  Assessment/Plan Active Problems:   DKA, type 1   DKA (diabetic ketoacidoses)  DKA with anion gap metabolic  acidosis -Admitted to stepdown -Likely secondary to malfunctioning insulin pump.  Unlikely secondary to infectious patient denies any shortness of breath, cough, urinary symptoms. -Will monitor BMP and CBGs -Placed on  glucose stabilizer, IVF -NPO  -Will consult diabetes coordinator -Will obtain hemoglobin A1c  Leukocytosis -Likely reactive to DKA, we'll continue to monitor CBC -UA negative for infection. Patient denies any urinary symptoms. Denies any cough.  Mild Acute kidney injury -Likely secondary dehydration from DKA  Nausea/Vomiting -Likely secondary to DKA -Continue antimanic  Thyroid nodule -Stable, per patient.  She has regular thyroid function testing as well as ultrasounds.  Depression -Will hold her medications, Abilify, Klonopin, Cymbalta. -Will place on Ativan 1 mg every 6 hours as needed.  DVT prophylaxis: Lovenox  Code Status: Full  Condition: Guarded  Family Communication: Not at bedside. Admission, patients condition and plan of care including tests being ordered have been discussed with the patient, who indicates understanding and agrees with the plan and Code Status.  Disposition Plan: Admitted  Time spent: 65 minutes  Kasandra Fehr D.O. [facility] Pager [phone]  If 7PM-7AM, please contact night-coverage [email or web address] Password [facility] 02/14/2015, 5:31 PM

## 2015-02-14 NOTE — ED Notes (Signed)
Per pt, has had n/v with hyperglycemia starting today.  Pt changed her pump tubing out to check for faulty tubes.  Pt states sugar was in 500's at home.  No change in medicine dosing.

## 2015-02-14 NOTE — ED Notes (Signed)
Report given to the unit to [place], [place]

## 2015-02-15 LAB — CBC
HEMATOCRIT: 35.9 % — AB (ref 36.0–46.0)
Hemoglobin: 11.6 g/dL — ABNORMAL LOW (ref 12.0–15.0)
MCH: 28.5 pg (ref 26.0–34.0)
MCHC: 32.3 g/dL (ref 30.0–36.0)
MCV: 88.2 fL (ref 78.0–100.0)
Platelets: 317 10*3/uL (ref 150–400)
RBC: 4.07 MIL/uL (ref 3.87–5.11)
RDW: 13 % (ref 11.5–15.5)
WBC: 8.8 10*3/uL (ref 4.0–10.5)

## 2015-02-15 LAB — BASIC METABOLIC PANEL
ANION GAP: 6 (ref 5–15)
ANION GAP: 5 (ref 5–15)
Anion gap: 8 (ref 5–15)
Anion gap: 10 (ref 5–15)
Anion gap: 7 (ref 5–15)
BUN: 7 mg/dL (ref 6–20)
BUN: 10 mg/dL (ref 6–20)
BUN: 12 mg/dL (ref 6–20)
BUN: 14 mg/dL (ref 6–20)
BUN: 6 mg/dL (ref 6–20)
CALCIUM: 8 mg/dL — AB (ref 8.9–10.3)
CALCIUM: 8.1 mg/dL — AB (ref 8.9–10.3)
CALCIUM: 7.7 mg/dL — AB (ref 8.9–10.3)
CALCIUM: 8 mg/dL — AB (ref 8.9–10.3)
CALCIUM: 8.2 mg/dL — AB (ref 8.9–10.3)
CO2: 12 mmol/L — ABNORMAL LOW (ref 22–32)
CO2: 12 mmol/L — ABNORMAL LOW (ref 22–32)
CO2: 11 mmol/L — ABNORMAL LOW (ref 22–32)
CO2: 15 mmol/L — AB (ref 22–32)
CO2: 15 mmol/L — AB (ref 22–32)
CREATININE: 0.52 mg/dL (ref 0.44–1.00)
CREATININE: 0.66 mg/dL (ref 0.44–1.00)
Chloride: 117 mmol/L — ABNORMAL HIGH (ref 101–111)
Chloride: 114 mmol/L — ABNORMAL HIGH (ref 101–111)
Chloride: 118 mmol/L — ABNORMAL HIGH (ref 101–111)
Chloride: 116 mmol/L — ABNORMAL HIGH (ref 101–111)
Chloride: 116 mmol/L — ABNORMAL HIGH (ref 101–111)
Creatinine, Ser: 0.58 mg/dL (ref 0.44–1.00)
Creatinine, Ser: 0.9 mg/dL (ref 0.44–1.00)
Creatinine, Ser: 0.63 mg/dL (ref 0.44–1.00)
GFR calc Af Amer: >60 mL/min (ref >60)
GFR calc Af Amer: >60 mL/min (ref >60)
GFR calc Af Amer: >60 mL/min (ref >60)
GLUCOSE: 127 mg/dL — AB (ref 65–99)
GLUCOSE: 154 mg/dL — AB (ref 65–99)
GLUCOSE: 251 mg/dL — AB (ref 65–99)
Glucose, Bld: 124 mg/dL — ABNORMAL HIGH (ref 65–99)
Glucose, Bld: 226 mg/dL — ABNORMAL HIGH (ref 65–99)
Potassium: 4.3 mmol/L (ref 3.5–5.1)
Potassium: 3.8 mmol/L (ref 3.5–5.1)
Potassium: 3.8 mmol/L (ref 3.5–5.1)
Potassium: 4.6 mmol/L (ref 3.5–5.1)
Potassium: 4.5 mmol/L (ref 3.5–5.1)
SODIUM: 137 mmol/L (ref 135–145)
SODIUM: 136 mmol/L (ref 135–145)
Sodium: 137 mmol/L (ref 135–145)
Sodium: 136 mmol/L (ref 135–145)
Sodium: 136 mmol/L (ref 135–145)

## 2015-02-15 LAB — GLUCOSE, CAPILLARY
GLUCOSE-CAPILLARY: 117 mg/dL — AB (ref 65–99)
GLUCOSE-CAPILLARY: 124 mg/dL — AB (ref 65–99)
GLUCOSE-CAPILLARY: 144 mg/dL — AB (ref 65–99)
GLUCOSE-CAPILLARY: 166 mg/dL — AB (ref 65–99)
GLUCOSE-CAPILLARY: 190 mg/dL — AB (ref 65–99)
GLUCOSE-CAPILLARY: 208 mg/dL — AB (ref 65–99)
GLUCOSE-CAPILLARY: 230 mg/dL — AB (ref 65–99)
GLUCOSE-CAPILLARY: 248 mg/dL — AB (ref 65–99)
GLUCOSE-CAPILLARY: 265 mg/dL — AB (ref 65–99)
Glucose-Capillary: 173 mg/dL — ABNORMAL HIGH (ref 65–99)
Glucose-Capillary: 250 mg/dL — ABNORMAL HIGH (ref 65–99)
Glucose-Capillary: 220 mg/dL — ABNORMAL HIGH (ref 65–99)
Glucose-Capillary: 185 mg/dL — ABNORMAL HIGH (ref 65–99)
Glucose-Capillary: 250 mg/dL — ABNORMAL HIGH (ref 65–99)
Glucose-Capillary: 111 mg/dL — ABNORMAL HIGH (ref 65–99)
Glucose-Capillary: 102 mg/dL — ABNORMAL HIGH (ref 65–99)
Glucose-Capillary: 122 mg/dL — ABNORMAL HIGH (ref 65–99)
Glucose-Capillary: 131 mg/dL — ABNORMAL HIGH (ref 65–99)
Glucose-Capillary: 135 mg/dL — ABNORMAL HIGH (ref 65–99)
Glucose-Capillary: 219 mg/dL — ABNORMAL HIGH (ref 65–99)
Glucose-Capillary: 257 mg/dL — ABNORMAL HIGH (ref 65–99)
Glucose-Capillary: 175 mg/dL — ABNORMAL HIGH (ref 65–99)

## 2015-02-15 MED ORDER — NAPROXEN 500 MG PO TABS
500.0000 mg | ORAL_TABLET | Freq: Two times a day (BID) | ORAL | Status: DC
  Administered 2015-02-15 – 2015-02-17 (×4): 500 mg via ORAL
  Filled 2015-02-15 (×7): qty 1

## 2015-02-15 MED ORDER — POTASSIUM CHLORIDE 10 MEQ/100ML IV SOLN
INTRAVENOUS | Status: AC
  Administered 2015-02-15: 10 meq via INTRAVENOUS
  Filled 2015-02-15: qty 100

## 2015-02-15 MED ORDER — CLONAZEPAM 0.5 MG PO TABS: 0.5000 mg | ORAL_TABLET | Freq: Three times a day (TID) | ORAL | Status: DC | PRN

## 2015-02-15 MED ORDER — SODIUM CHLORIDE 0.9 % IV BOLUS (SEPSIS)
1000.0000 mL | Freq: Once | INTRAVENOUS | Status: AC
  Administered 2015-02-15: 1000 mL via INTRAVENOUS

## 2015-02-15 MED ORDER — MORPHINE SULFATE (PF) 2 MG/ML IV SOLN
2.0000 mg | INTRAVENOUS | Status: DC | PRN
  Administered 2015-02-15 – 2015-02-16 (×5): 2 mg via INTRAVENOUS
  Filled 2015-02-15 (×5): qty 1

## 2015-02-15 MED ORDER — CHLORHEXIDINE GLUCONATE 0.12 % MT SOLN
15.0000 mL | Freq: Two times a day (BID) | OROMUCOSAL | Status: DC
  Administered 2015-02-15: 15 mL via OROMUCOSAL
  Filled 2015-02-15: qty 15

## 2015-02-15 MED ORDER — DEXTROSE-NACL 5-0.45 % IV SOLN
INTRAVENOUS | Status: DC
  Administered 2015-02-15 (×2): via INTRAVENOUS
  Administered 2015-02-16: 150 mL via INTRAVENOUS
  Administered 2015-02-16: 08:00:00 via INTRAVENOUS

## 2015-02-15 MED ORDER — INSULIN PUMP
Freq: Three times a day (TID) | SUBCUTANEOUS | Status: DC
  Filled 2015-02-15: qty 1

## 2015-02-15 MED ORDER — CETYLPYRIDINIUM CHLORIDE 0.05 % MT LIQD
7.0000 mL | Freq: Two times a day (BID) | OROMUCOSAL | Status: DC
  Administered 2015-02-15: 7 mL via OROMUCOSAL

## 2015-02-15 MED ORDER — ARIPIPRAZOLE 10 MG PO TABS
20.0000 mg | ORAL_TABLET | Freq: Every day | ORAL | Status: DC
  Administered 2015-02-15 – 2015-02-17 (×3): 20 mg via ORAL
  Filled 2015-02-15 (×3): qty 2

## 2015-02-15 MED ORDER — DULOXETINE HCL 30 MG PO CPEP
60.0000 mg | ORAL_CAPSULE | Freq: Every day | ORAL | Status: DC
  Administered 2015-02-15 – 2015-02-16 (×2): 60 mg via ORAL
  Filled 2015-02-15 (×2): qty 2

## 2015-02-15 MED ORDER — DEXTROSE 10 % IV SOLN
INTRAVENOUS | Status: DC
  Filled 2015-02-15 (×4): qty 1000

## 2015-02-15 MED ORDER — NAPROXEN 500 MG PO TABS: 500.0000 mg | ORAL_TABLET | Freq: Two times a day (BID) | ORAL | Status: DC

## 2015-02-15 NOTE — Progress Notes (Signed)
Spoke with Dr. Catha Gosselin.  She said when Pt's CO2 level reaches 20, to begin pt's home insulin pump regimen, stop the IV fluids, and stop the insulin gtt 2 hours later.  Pt may resume a full liquid diet when she feels ready.  No further orders at this time.  Will continue to monitor. Pt's vitals stable.  Barrie Lyme RN [phone] 02/15/2015

## 2015-02-15 NOTE — Progress Notes (Signed)
Inpatient Diabetes Program Recommendations  AACE/ADA: New Consensus Statement on Inpatient Glycemic Control (2013)  Target Ranges:  Prepandial:   less than 140 mg/dL      Peak postprandial:   less than 180 mg/dL (1-2 hours)      Critically ill patients:  140 - 180 mg/dL    Results for [name], [name] (MRN [id number]) as of 02/15/2015 10:27  Ref. Range 02/14/2015 15:50  Sodium Latest Ref Range: 135-145 mmol/L 136  Potassium Latest Ref Range: 3.5-5.1 mmol/L 4.5  Chloride Latest Ref Range: 101-111 mmol/L 109  CO2 Latest Ref Range: 22-32 mmol/L 8 (L)  BUN Latest Ref Range: 6-20 mg/dL 16  Creatinine Latest Ref Range: 0.44-1.00 mg/dL 1.15 (H)  Calcium Latest Ref Range: 8.9-10.3 mg/dL 9.1  EGFR (Non-African Amer.) Latest Ref Range: >60 mL/min 60 (L)  EGFR (African American) Latest Ref Range: >60 mL/min >60  Glucose Latest Ref Range: 65-99 mg/dL 302 (H)  Anion gap Latest Ref Range: 5-15  19 (H)    Admit with: DKA (question insulin pump malfunction)  History: Type 1 DM  Home DM Meds: Insulin Pump  Current DM Orders: IV Insulin drip Per DKA Protocol     -BMET from 4am today still shows CO2 only up to 12.    -Would wait to transition patient off IV insulin drip until CO2 closer to 20.  -Will see patient today to assess issues with insulin pump.    Will follow Wyn Quaker RN, MSN, CDE Diabetes Coordinator Inpatient Glycemic Control Team Team Pager: [phone] (8a-5p)

## 2015-02-15 NOTE — Progress Notes (Signed)
[facility]                                                                              Patient Demographics  [name], is a 39 y.o. female, DOB - May 13, 1976, ZOX:[id number]  Admit date - 02/14/2015   Admitting Physician Edsel Petrin, DO  Outpatient Primary MD for the patient is NNODI, Alesia Richards, MD  LOS - 1   Chief Complaint  Patient presents with  . Hyperglycemia  . Emesis      Interim history 40 year old female with diabetes mellitus type 1, on insulin pump presented with hyperglycemia. It seems her insulin pump was not working prior to coming in. Patient has changes side as well as the tubing. Currently on glucose stabilizer. Has closed however patient continues to have a bicarbonate of 12.  Assessment & Plan   DKA with anion gap metabolic acidosis -Likely secondary to malfunctioning insulin pump. Unlikely secondary to infectious etiology, patient denies any shortness of breath, cough, urinary symptoms. -Continue to monitor BMP and CBGs -Continue glucose stabilizer with IV fluid -Will advance diet to clear liquids and monitor carefully -Diabetes coronary consulted and appreciated -Hemoglobin A1c pending  Leukocytosis -Resolved, likely secondary to DKA -UA negative for infection. Patient denies any urinary symptoms. Denies any cough.  Mild Acute kidney injury -Likely secondary dehydration from DKA  Nausea/Vomiting -Likely secondary to DKA -Continue antiemetic   Thyroid nodule -Stable, per patient. She has regular thyroid function testing as well as ultrasounds.  Depression -Home medications of  Abilify, Klonopin, Cymbalta initially held due to NPO- will restart today.  Left shoulder pain -Patient has history of Frozen shoulder -Continue pain medication PRN  Code Status: Full  Family Communication: None at bedside  Disposition Plan: Admitted  Time Spent in minutes   30 minutes  Procedures  None  Consults   None  DVT Prophylaxis   Lovenox  Lab Results  Component Value Date   PLT 317 02/15/2015    Medications  Scheduled Meds: . enoxaparin (LOVENOX) injection  40 mg Subcutaneous Q24H  . ondansetron  4 mg Oral Once   Continuous Infusions: . dextrose Stopped (02/15/15 0842)  . dextrose 5 % and 0.45% NaCl 150 mL/hr (02/15/15 0316)  . insulin (NOVOLIN-R) infusion 11.9 Units/hr (02/15/15 1050)   PRN Meds:.LORazepam, morphine injection, promethazine, zolpidem  Antibiotics    Anti-infectives    None      Subjective:   [name] seen and examined today.  Patient continues to complain of left shoulder pain. She does have a known history of frozen shoulder. Feels her nausea is better and wishes to eat. Denies any shortness of breath, chest pain, abdominal pain.  Objective:   Filed Vitals:   02/15/15 0700 02/15/15 0800 02/15/15 0830 02/15/15 1000  BP: 126/54     Pulse: 100   96  Temp:  97.6 F (36.4 C)    TempSrc:  Oral    Resp: 18   19  Height:    (1.549 m)   Weight:   73 kg (160 lb 15 oz)   SpO2: 96%   97%    Wt Readings from Last 3 Encounters:  02/15/15 73 kg (160 lb 15  oz)  01/24/15 74.39 kg (164 lb)  02/10/14 70.308 kg (155 lb)     Intake/Output Summary (Last 24 hours) at 02/15/15 1125 Last data filed at 02/15/15 0520  Gross per 24 hour  Intake 1726.64 ml  Output      0 ml  Net 1726.64 ml    Exam  General: Well developed, well nourished, no distress  HEENT: NCAT,  mucous membranes moist.   Cardiovascular: S1 S2 auscultated, no rubs, murmurs or gallops. Regular rate and rhythm.  Respiratory: Clear to auscultation bilaterally with equal chest rise  Abdomen: Soft, nontender, nondistended, + bowel sounds  Extremities: warm dry without cyanosis clubbing or edema  Neuro: AAOx3, nonfocal  Psych: Normal affect and demeanor   Data Review   Micro Results Recent Results (from the past 240 hour(s))  MRSA PCR Screening     Status: None   Collection Time: 02/14/15  8:16 PM   Result Value Ref Range Status   MRSA by PCR NEGATIVE NEGATIVE Final    Comment:        The GeneXpert MRSA Assay (FDA approved for NASAL specimens only), is one component of a comprehensive MRSA colonization surveillance program. It is not intended to diagnose MRSA infection nor to guide or monitor treatment for MRSA infections.     Radiology Reports Dg Shoulder Left  01/24/2015   CLINICAL DATA:  Left shoulder pain. Pain for 1 week, progressive and severe this morning. No known injury.  EXAM: LEFT SHOULDER - 2+ VIEW  COMPARISON:  None.  FINDINGS: No fracture or dislocation. The alignment and joint spaces are maintained. No focal soft tissue calcifications.  IMPRESSION: Negative radiographs of the left shoulder.   Electronically Signed   By: Rubye Oaks M.D.   On: 01/24/2015 06:27    CBC  Recent Labs Lab 02/14/15 1550 02/14/15 2306 02/15/15 0425  WBC 11.0* 10.1 8.8  HGB 15.9* 13.6 11.6*  HCT 48.0* 40.0 35.9*  PLT 431* 371 317  MCV 89.1 87.1 88.2  MCH 29.5 29.6 28.5  MCHC 33.1 34.0 32.3  RDW 13.0 13.0 13.0    Chemistries   Recent Labs Lab 02/14/15 1550 02/14/15 2306 02/15/15 0020 02/15/15 0425  NA 136 137 137 136  K 4.5 4.4 4.3 3.8  CL 109 116* 117* 114*  CO2 8* 11* 12* 12*  GLUCOSE 302* 159* 127* 251*  BUN 16 14 14 12   CREATININE 1.15* 0.71 0.58 0.90  CALCIUM 9.1 8.3* 8.2* 7.7*   ------------------------------------------------------------------------------------------------------------------ estimated creatinine clearance is 77.5 mL/min (by C-G formula based on Cr of 0.9). ------------------------------------------------------------------------------------------------------------------ No results for input(s): HGBA1C in the last 72 hours. ------------------------------------------------------------------------------------------------------------------ No results for input(s): CHOL, HDL, LDLCALC, TRIG, CHOLHDL, LDLDIRECT in the last 72  hours. ------------------------------------------------------------------------------------------------------------------ No results for input(s): TSH, T4TOTAL, T3FREE, THYROIDAB in the last 72 hours.  Invalid input(s): FREET3 ------------------------------------------------------------------------------------------------------------------ No results for input(s): VITAMINB12, FOLATE, FERRITIN, TIBC, IRON, RETICCTPCT in the last 72 hours.  Coagulation profile No results for input(s): INR, PROTIME in the last 168 hours.  No results for input(s): DDIMER in the last 72 hours.  Cardiac Enzymes No results for input(s): CKMB, TROPONINI, MYOGLOBIN in the last 168 hours.  Invalid input(s): CK ------------------------------------------------------------------------------------------------------------------ Invalid input(s): POCBNP    Atlee Villers D.O. on 02/15/2015 at 11:25 AM  Between 7am to 7pm - Pager - [phone]  After 7pm go to [email or web address] - password [email or web address]  And look for the night coverage person covering for me after hours  [facility] Office  [phone]

## 2015-02-15 NOTE — Progress Notes (Signed)
Pt denies nausea.  Pt tolerated salines.  Will continue to monitor.  Barrie Lyme RN 9:46 PM 02/15/2015

## 2015-02-15 NOTE — Progress Notes (Signed)
Inpatient Diabetes Program Recommendations  AACE/ADA: New Consensus Statement on Inpatient Glycemic Control (2013)  Target Ranges:  Prepandial:   less than 140 mg/dL      Peak postprandial:   less than 180 mg/dL (1-2 hours)      Critically ill patients:  140 - 180 mg/dL   Results for [name], [name] (MRN [id number]) as of 02/15/2015 13:15  Ref. Range 02/15/2015 08:26  Sodium Latest Ref Range: 135-145 mmol/L 136  Potassium Latest Ref Range: 3.5-5.1 mmol/L 3.8  Chloride Latest Ref Range: 101-111 mmol/L 118 (H)  CO2 Latest Ref Range: 22-32 mmol/L 11 (L)  BUN Latest Ref Range: 6-20 mg/dL 10  Creatinine Latest Ref Range: 0.44-1.00 mg/dL 0.63  Calcium Latest Ref Range: 8.9-10.3 mg/dL 8.0 (L)  EGFR (Non-African Amer.) Latest Ref Range: >60 mL/min >60  EGFR (African American) Latest Ref Range: >60 mL/min >60  Glucose Latest Ref Range: 65-99 mg/dL 154 (H)  Anion gap Latest Ref Range: 5-15  7     -8AM BMET showed CO2 still low at 11.  -Currently waiting on 12PM BMET results.    Spoke with patient this afternoon.  Patient sees Dr. Delrae Rend [facility]) for DM management.  Uses [employer] insulin pump at home.  Per patient, she had to miss her last appointment with Dr. Buddy Duty and needs to re-schedule asap.  Tries to make appointments around days off from school in [place].  Patient told me she woke up Monday morning with CBG >500 mg/dl.  When she went to change her insertion site, she noticed the insertion site cannula was crimped and not allowing insulin flow.  Patient quickly changed her insertion site and gave herself several correction boluses.  CBGs came down to the 250 range, however, patient began to have nausea and vomiting.  Patient stated she then changed her reservoir and insulin, however, she ended up feeling so poorly that she came to the hospital.  See below for patient's insulin pump settings.  Advised patient that per hospital policy, she needs to have all new pump  supplies (insertion site, insulin reservoir, insulin, tubing, etc) to restart her insulin pump in the hospital.  Advised patient to call the 1-800# on the back of her pump to perform a safety check as well before she resumes her insulin pump.  Patient agreeable.  Explained to patient all the treatments we are providing to her to reverse her acidosis and explained to patient that once her labs have improved that the doctor will likely let her resume her insulin pump.  Explained to patient that we will continue to provide her with IVF and IV insulin drip until her CO2 is closer to 20 on her labs indicating that her acidosis has cleared.  Once MD gives OK to resume her pump (with all new supplies), instructed patient that we will likely have her resume her pump and continue the IV insulin drip for 1-2 hours then we can d/c the IV insulin drip and Q1 hour CBG checks.  Patient stated she understood.   Insulin Pump Settings are as follows  Basal Rates: 12am- 1.25 units/hr 6am- 1.5 units/hr 12pm- 1.6 units/hr  Total Basal per 24 hour period= 35.7 units insulin   Carbohydrate Coverage Ratio= 1 unit for every 23 grams of carbohydrates consumed  Sensitivity/Correction Factor= 1 unit for every 40 mg/dl above target CBG  Target CBG= 100 mg/dl    MD- When patient's CO2 is closer to 20, please have patient resume her insulin pump with all  new supplies.  Please have RN stop IV insulin drip 1-2 hours after pump has been restarted.    Insulin Pump order set will need to be placed into EPIC (go under Order sets section of EPIC and search for Insulin pump)     Will follow Wyn Quaker RN, MSN, CDE Diabetes Coordinator Inpatient Glycemic Control Team Team Pager: [phone] (8a-5p)

## 2015-02-16 DIAGNOSIS — E041 Nontoxic single thyroid nodule: Secondary | ICD-10-CM

## 2015-02-16 DIAGNOSIS — E1010 Type 1 diabetes mellitus with ketoacidosis without coma: Principal | ICD-10-CM

## 2015-02-16 DIAGNOSIS — E872 Acidosis: Secondary | ICD-10-CM

## 2015-02-16 DIAGNOSIS — F418 Other specified anxiety disorders: Secondary | ICD-10-CM

## 2015-02-16 LAB — CBC
HCT: 34 % — ABNORMAL LOW (ref 36.0–46.0)
HEMOGLOBIN: 11.7 g/dL — AB (ref 12.0–15.0)
MCH: 29.8 pg (ref 26.0–34.0)
MCHC: 34.4 g/dL (ref 30.0–36.0)
MCV: 86.5 fL (ref 78.0–100.0)
PLATELETS: 284 10*3/uL (ref 150–400)
RBC: 3.93 MIL/uL (ref 3.87–5.11)
RDW: 13.1 % (ref 11.5–15.5)
WBC: 5.3 10*3/uL (ref 4.0–10.5)

## 2015-02-16 LAB — BASIC METABOLIC PANEL
ANION GAP: 5 (ref 5–15)
ANION GAP: 6 (ref 5–15)
ANION GAP: 7 (ref 5–15)
ANION GAP: 8 (ref 5–15)
Anion gap: 5 (ref 5–15)
BUN: <5 mg/dL — ABNORMAL LOW (ref 6–20)
BUN: <5 mg/dL — ABNORMAL LOW (ref 6–20)
BUN: <5 mg/dL — ABNORMAL LOW (ref 6–20)
BUN: <5 mg/dL — ABNORMAL LOW (ref 6–20)
BUN: <5 mg/dL — ABNORMAL LOW (ref 6–20)
CALCIUM: 7.9 mg/dL — AB (ref 8.9–10.3)
CALCIUM: 8.2 mg/dL — AB (ref 8.9–10.3)
CHLORIDE: 111 mmol/L (ref 101–111)
CHLORIDE: 113 mmol/L — AB (ref 101–111)
CHLORIDE: 115 mmol/L — AB (ref 101–111)
CHLORIDE: 116 mmol/L — AB (ref 101–111)
CO2: 16 mmol/L — AB (ref 22–32)
CO2: 16 mmol/L — AB (ref 22–32)
CO2: 17 mmol/L — ABNORMAL LOW (ref 22–32)
CO2: 19 mmol/L — AB (ref 22–32)
CO2: 21 mmol/L — ABNORMAL LOW (ref 22–32)
Calcium: 7.7 mg/dL — ABNORMAL LOW (ref 8.9–10.3)
Calcium: 8.6 mg/dL — ABNORMAL LOW (ref 8.9–10.3)
Calcium: 8.4 mg/dL — ABNORMAL LOW (ref 8.9–10.3)
Chloride: 116 mmol/L — ABNORMAL HIGH (ref 101–111)
Creatinine, Ser: 0.55 mg/dL (ref 0.44–1.00)
Creatinine, Ser: 0.5 mg/dL (ref 0.44–1.00)
Creatinine, Ser: 0.62 mg/dL (ref 0.44–1.00)
Creatinine, Ser: 0.62 mg/dL (ref 0.44–1.00)
Creatinine, Ser: 0.61 mg/dL (ref 0.44–1.00)
GFR calc Af Amer: >60 mL/min (ref >60)
GFR calc Af Amer: >60 mL/min (ref >60)
GFR calc Af Amer: >60 mL/min (ref >60)
GFR calc Af Amer: >60 mL/min (ref >60)
GFR calc non Af Amer: >60 mL/min (ref >60)
GFR calc non Af Amer: >60 mL/min (ref >60)
GFR calc non Af Amer: >60 mL/min (ref >60)
GFR calc non Af Amer: >60 mL/min (ref >60)
GFR calc non Af Amer: >60 mL/min (ref >60)
GLUCOSE: 120 mg/dL — AB (ref 65–99)
GLUCOSE: 195 mg/dL — AB (ref 65–99)
GLUCOSE: 206 mg/dL — AB (ref 65–99)
Glucose, Bld: 120 mg/dL — ABNORMAL HIGH (ref 65–99)
Glucose, Bld: 205 mg/dL — ABNORMAL HIGH (ref 65–99)
POTASSIUM: 4.5 mmol/L (ref 3.5–5.1)
POTASSIUM: 3.9 mmol/L (ref 3.5–5.1)
Potassium: 3.4 mmol/L — ABNORMAL LOW (ref 3.5–5.1)
Potassium: 4.2 mmol/L (ref 3.5–5.1)
Potassium: 4.5 mmol/L (ref 3.5–5.1)
SODIUM: 139 mmol/L (ref 135–145)
Sodium: 140 mmol/L (ref 135–145)
Sodium: 138 mmol/L (ref 135–145)
Sodium: 137 mmol/L (ref 135–145)
Sodium: 137 mmol/L (ref 135–145)

## 2015-02-16 LAB — GLUCOSE, CAPILLARY
GLUCOSE-CAPILLARY: 98 mg/dL (ref 65–99)
GLUCOSE-CAPILLARY: 77 mg/dL (ref 65–99)
GLUCOSE-CAPILLARY: 94 mg/dL (ref 65–99)
GLUCOSE-CAPILLARY: 125 mg/dL — AB (ref 65–99)
GLUCOSE-CAPILLARY: 161 mg/dL — AB (ref 65–99)
GLUCOSE-CAPILLARY: 182 mg/dL — AB (ref 65–99)
GLUCOSE-CAPILLARY: 175 mg/dL — AB (ref 65–99)
GLUCOSE-CAPILLARY: 264 mg/dL — AB (ref 65–99)
GLUCOSE-CAPILLARY: 158 mg/dL — AB (ref 65–99)
GLUCOSE-CAPILLARY: 297 mg/dL — AB (ref 65–99)
GLUCOSE-CAPILLARY: 220 mg/dL — AB (ref 65–99)
GLUCOSE-CAPILLARY: 183 mg/dL — AB (ref 65–99)
GLUCOSE-CAPILLARY: 127 mg/dL — AB (ref 65–99)
Glucose-Capillary: 136 mg/dL — ABNORMAL HIGH (ref 65–99)
Glucose-Capillary: 142 mg/dL — ABNORMAL HIGH (ref 65–99)
Glucose-Capillary: 157 mg/dL — ABNORMAL HIGH (ref 65–99)
Glucose-Capillary: 159 mg/dL — ABNORMAL HIGH (ref 65–99)
Glucose-Capillary: 173 mg/dL — ABNORMAL HIGH (ref 65–99)
Glucose-Capillary: 202 mg/dL — ABNORMAL HIGH (ref 65–99)
Glucose-Capillary: 232 mg/dL — ABNORMAL HIGH (ref 65–99)
Glucose-Capillary: 262 mg/dL — ABNORMAL HIGH (ref 65–99)

## 2015-02-16 LAB — HEMOGLOBIN A1C
Hgb A1c MFr Bld: 10.6 % — ABNORMAL HIGH (ref 4.8–5.6)
MEAN PLASMA GLUCOSE: 258 mg/dL

## 2015-02-16 MED ORDER — SODIUM CHLORIDE 0.9 % IV SOLN
INTRAVENOUS | Status: DC
  Administered 2015-02-16: 75 mL via INTRAVENOUS

## 2015-02-16 MED ORDER — SODIUM CHLORIDE 0.9 % IV BOLUS (SEPSIS)
500.0000 mL | Freq: Once | INTRAVENOUS | Status: AC
  Administered 2015-02-16: 500 mL via INTRAVENOUS

## 2015-02-16 MED ORDER — INSULIN PUMP
Freq: Three times a day (TID) | SUBCUTANEOUS | Status: DC
  Administered 2015-02-17 (×2): via SUBCUTANEOUS
  Filled 2015-02-16: qty 1

## 2015-02-16 MED ORDER — POTASSIUM CHLORIDE 10 MEQ/100ML IV SOLN
10.0000 meq | INTRAVENOUS | Status: AC
  Administered 2015-02-16 (×3): 10 meq via INTRAVENOUS
  Filled 2015-02-16: qty 100

## 2015-02-16 NOTE — Progress Notes (Signed)
[facility]                                                                               Interim history 39 year old female with diabetes mellitus type 1, on insulin pump presented with hyperglycemia. It seems her insulin pump was not working prior to coming in. Patient has changes side as well as the tubing. Currently on glucose stabilizer. Has closed however patient continues to have a bicarbonate of 12.  Assessment & Plan   DKA with anion gap metabolic acidosis -Likely secondary to malfunctioning insulin pump. Unlikely secondary to infectious etiology, patient denies any shortness of breath, cough, urinary symptoms. -bicarbonate still is in the 15-16 range and will need close to 20 prior to transition -Continue glucose stabilizer with IV fluid -Continue clear liquid diet -Diabetes coronary consulted and appreciated -Hemoglobin A1c pending  Type 1 diabetes mellitus since age 74 Patient is to be given insulin according to her insulin pump as below per diabetic coordinator This is to be ordered when CO2 is in the 20 range Basal Rates: 12am- 1.25 units/hr 6am- 1.5 units/hr 12pm- 1.6 units/hr  Total Basal per 24 hour period= 35.7 units insulin Carbohydrate Coverage Ratio= 1 unit for every 23 grams of carbohydrates consumed   Leukocytosis -Resolved, likely secondary to DKA -UA negative for infection. Patient denies any urinary symptoms. Denies any cough.  Mild Acute kidney injury -Likely secondary dehydration from DKA -Resolving well  Nausea/Vomiting -Likely secondary to DKA -Continue antiemetic resolving well -  Thyroid nodule -Stable, per patient. She has regular thyroid function testing as well as ultrasounds.  Depression -Home medications of  Abilify, Klonopin, Cymbalta initially held due to NPO- -She is able to take them   Left shoulder pain -Patient has history of Frozen shoulder -Continue pain medication PRN  Code Status: Full  Family  Communication: None at bedside  Disposition Plan: Admitted  Time Spent in minutes   30 minutes  Procedures  None  Consults   None  DVT Prophylaxis  Lovenox  Lab Results  Component Value Date   PLT 284 02/16/2015    Medications  Scheduled Meds: . ARIPiprazole  20 mg Oral Daily  . DULoxetine  60 mg Oral Daily  . enoxaparin (LOVENOX) injection  40 mg Subcutaneous Q24H  . insulin pump   Subcutaneous TID AC, HS, 0200  . naproxen  500 mg Oral BID WC  . ondansetron  4 mg Oral Once   Continuous Infusions: . dextrose Stopped (02/15/15 0842)  . dextrose 5 % and 0.45% NaCl 150 mL/hr at 02/16/15 0800  . insulin (NOVOLIN-R) infusion 3 Units/hr (02/16/15 1559)   PRN Meds:.clonazePAM, LORazepam, morphine injection, promethazine, zolpidem  Antibiotics    Anti-infectives    None      Subjective:   Alert oriented and sleepy when I saw her however more awake later   tolerating some clear liquids   no chest pain no nausea no vomiting   Objective:   Filed Vitals:   02/16/15 0800 02/16/15 0835 02/16/15 1130 02/16/15 1200  BP:  114/53 133/71   Pulse:  75    Temp: 97.5 F (36.4 C)   98.6 F (37 C)  TempSrc: Oral  Oral  Resp:  12 13   Height:      Weight:      SpO2:  97% 97%     Wt Readings from Last 3 Encounters:  02/16/15 78.1 kg (172 lb 2.9 oz)  01/24/15 74.39 kg (164 lb)  02/10/14 70.308 kg (155 lb)     Intake/Output Summary (Last 24 hours) at 02/16/15 1602 Last data filed at 02/16/15 0900  Gross per 24 hour  Intake 7095.03 ml  Output   1700 ml  Net 5395.03 ml    Exam  General: Well developed, well nourished, no distress  HEENT: NCAT,  mucous membranes moist.   Cardiovascular: S1 S2 auscultated, no rubs, murmurs or gallops. Regular rate and rhythm.  Respiratory: Clear to auscultation bilaterally with equal chest rise  Abdomen: Soft, nontender, nondistended, + bowel sounds   Data Review   Micro Results Recent Results (from the past 240  hour(s))  MRSA PCR Screening     Status: None   Collection Time: 02/14/15  8:16 PM  Result Value Ref Range Status   MRSA by PCR NEGATIVE NEGATIVE Final    Comment:        The GeneXpert MRSA Assay (FDA approved for NASAL specimens only), is one component of a comprehensive MRSA colonization surveillance program. It is not intended to diagnose MRSA infection nor to guide or monitor treatment for MRSA infections.     Radiology Reports Dg Shoulder Left  01/24/2015   CLINICAL DATA:  Left shoulder pain. Pain for 1 week, progressive and severe this morning. No known injury.  EXAM: LEFT SHOULDER - 2+ VIEW  COMPARISON:  None.  FINDINGS: No fracture or dislocation. The alignment and joint spaces are maintained. No focal soft tissue calcifications.  IMPRESSION: Negative radiographs of the left shoulder.   Electronically Signed   By: Rubye Oaks M.D.   On: 01/24/2015 06:27    CBC  Recent Labs Lab 02/14/15 1550 02/14/15 2306 02/15/15 0425 02/16/15 0410  WBC 11.0* 10.1 8.8 5.3  HGB 15.9* 13.6 11.6* 11.7*  HCT 48.0* 40.0 35.9* 34.0*  PLT 431* 371 317 284  MCV 89.1 87.1 88.2 86.5  MCH 29.5 29.6 28.5 29.8  MCHC 33.1 34.0 32.3 34.4  RDW 13.0 13.0 13.0 13.1    Chemistries   Recent Labs Lab 02/15/15 2055 02/15/15 2345 02/16/15 0410 02/16/15 0815 02/16/15 1414  NA 136 140 138 137 137  K 4.5 3.4* 4.2 4.5 4.5  CL 116* 116* 116* 115* 113*  CO2 15* 19* 17* 16* 16*  GLUCOSE 226* 120* 120* 195* 206*  BUN 6 <5* <5* <5* <5*  CREATININE 0.66 0.55 0.50 0.62 0.62  CALCIUM 8.1* 8.2* 7.7* 7.9* 8.6*   ------------------------------------------------------------------------------------------------------------------ estimated creatinine clearance is 90.2 mL/min (by C-G formula based on Cr of 0.62). ------------------------------------------------------------------------------------------------------------------  Recent Labs  02/15/15 0425  HGBA1C 10.6*    ------------------------------------------------------------------------------------------------------------------ No results for input(s): CHOL, HDL, LDLCALC, TRIG, CHOLHDL, LDLDIRECT in the last 72 hours. ------------------------------------------------------------------------------------------------------------------ No results for input(s): TSH, T4TOTAL, T3FREE, THYROIDAB in the last 72 hours.  Invalid input(s): FREET3 ------------------------------------------------------------------------------------------------------------------ No results for input(s): VITAMINB12, FOLATE, FERRITIN, TIBC, IRON, RETICCTPCT in the last 72 hours.  Coagulation profile No results for input(s): INR, PROTIME in the last 168 hours.  No results for input(s): DDIMER in the last 72 hours.  Cardiac Enzymes No results for input(s): CKMB, TROPONINI, MYOGLOBIN in the last 168 hours.  Invalid input(s): CK ------------------------------------------------------------------------------------------------------------------ Invalid input(s): POCBNP   Pleas Koch, MD [facility] [phone]

## 2015-02-16 NOTE — Progress Notes (Addendum)
Inpatient Diabetes Program Recommendations  AACE/ADA: New Consensus Statement on Inpatient Glycemic Control (2013)  Target Ranges:  Prepandial:   less than 140 mg/dL      Peak postprandial:   less than 180 mg/dL (1-2 hours)      Critically ill patients:  140 - 180 mg/dL   Results for [name], [name] (MRN [id number]) as of 02/16/2015 11:43  Ref. Range 02/16/2015 08:15  Sodium Latest Ref Range: 135-145 mmol/L 137  Potassium Latest Ref Range: 3.5-5.1 mmol/L 4.5  Chloride Latest Ref Range: 101-111 mmol/L 115 (H)  CO2 Latest Ref Range: 22-32 mmol/L 16 (L)  BUN Latest Ref Range: 6-20 mg/dL <5 (L)  Creatinine Latest Ref Range: 0.44-1.00 mg/dL 0.62  Calcium Latest Ref Range: 8.9-10.3 mg/dL 7.9 (L)  EGFR (Non-African Amer.) Latest Ref Range: >60 mL/min >60  EGFR (African American) Latest Ref Range: >60 mL/min >60  Glucose Latest Ref Range: 65-99 mg/dL 195 (H)  Anion gap Latest Ref Range: 5-15  6    Insulin Pump Settings are as follows  Basal Rates: 12am- 1.25 units/hr 6am- 1.5 units/hr 12pm- 1.6 units/hr  Total Basal per 24 hour period= 35.7 units insulin   Carbohydrate Coverage Ratio= 1 unit for every 23 grams of carbohydrates consumed  Sensitivity/Correction Factor= 1 unit for every 40 mg/dl above target CBG  Target CBG= 100 mg/dl    -Note CO2 had initially improved to 19 at midnight, but slowly dropped to 16 on 8am BMET.  -Spoke with patient again today.  Reviewed transition plan to insulin pump when MD gives orders to have patient resume her insulin pump.  Reminded patient we are still waiting on her labs to stabilize until we allow her to resume her pump.  Patient agreeable.  Patient stated she has all new pump supplies at bedside when the MD gives her the OK to resume her pump.  Will need Novolog from pharmacy to fill her pump.  -Spoke with RN caring for patient today.  Reminded RN to wait 1-2 hours after patient restarts her insulin pump before turning off the IV insulin  drip once the MD gives the orders for patient to resume her pump.  Also reviewed charting responsibilities for insulin pumps with the RN and alerted RN that she will need to call pharmacy for bottle of Novolog for patient to fill pump when that time comes.    MD- When patient's CO2 is closer to 20, please have patient resume her insulin pump with all new supplies. Please have RN stop IV insulin drip 1-2 hours after pump has been restarted.   Note Insulin Pump Order set has already been entered into EPIC once patient is allowed to resume her insulin pump.    Will follow Wyn Quaker RN, MSN, CDE Diabetes Coordinator Inpatient Glycemic Control Team Team Pager: [phone] (8a-5p)

## 2015-02-17 ENCOUNTER — Encounter: Payer: Self-pay | Admitting: Family Medicine

## 2015-02-17 DIAGNOSIS — N179 Acute kidney failure, unspecified: Secondary | ICD-10-CM

## 2015-02-17 DIAGNOSIS — D72829 Elevated white blood cell count, unspecified: Secondary | ICD-10-CM

## 2015-02-17 LAB — BASIC METABOLIC PANEL
ANION GAP: 9 (ref 5–15)
CALCIUM: 8.1 mg/dL — AB (ref 8.9–10.3)
CO2: 20 mmol/L — AB (ref 22–32)
Chloride: 110 mmol/L (ref 101–111)
Creatinine, Ser: 0.57 mg/dL (ref 0.44–1.00)
GFR calc Af Amer: >60 mL/min (ref >60)
GLUCOSE: 151 mg/dL — AB (ref 65–99)
POTASSIUM: 3.1 mmol/L — AB (ref 3.5–5.1)
Sodium: 139 mmol/L (ref 135–145)

## 2015-02-17 LAB — GLUCOSE, CAPILLARY
GLUCOSE-CAPILLARY: 76 mg/dL (ref 65–99)
GLUCOSE-CAPILLARY: 112 mg/dL — AB (ref 65–99)
GLUCOSE-CAPILLARY: 72 mg/dL (ref 65–99)
Glucose-Capillary: 114 mg/dL — ABNORMAL HIGH (ref 65–99)
Glucose-Capillary: 119 mg/dL — ABNORMAL HIGH (ref 65–99)
Glucose-Capillary: 65 mg/dL (ref 65–99)

## 2015-02-17 MED ORDER — POTASSIUM CHLORIDE CRYS ER 20 MEQ PO TBCR
30.0000 meq | EXTENDED_RELEASE_TABLET | Freq: Once | ORAL | Status: AC
  Administered 2015-02-17: 30 meq via ORAL
  Filled 2015-02-17 (×2): qty 1

## 2015-02-17 MED ORDER — POTASSIUM CHLORIDE ER 20 MEQ PO TBCR: 10.0000 meq | EXTENDED_RELEASE_TABLET | Freq: Every day | ORAL | Status: DC

## 2015-02-17 MED ORDER — ONDANSETRON 4 MG PO TBDP: 4.0000 mg | ORAL_TABLET | Freq: Once | ORAL | Status: DC

## 2015-02-17 NOTE — Progress Notes (Signed)
Hypoglycemic Event  CBG: 65  Treatment: 15 GM carbohydrate snack  Symptoms: None  Follow-up CBG: Time: 0321 CBG Result:114  Possible Reasons for Event: Inadequate meal intake  Comments/MD notified: K.Kirby, NP    [name], Lilia Pro  Remember to initiate Hypoglycemia Order Set & complete

## 2015-02-17 NOTE — Discharge Summary (Addendum)
Physician Discharge Summary  [name] QMV:[id number] DOB: 1975-10-19 DOA: 02/14/2015  PCP: Gretel Acre, MD  Admit date: 02/14/2015 Discharge date: 02/17/2015  Time spent: 25 minutes  Recommendations for Outpatient Follow-up:  1. Needs bmet 1 week as mild hypokalemia on d/c home 2. Needs close f/u Dr. Sharl Ma 3. continue insulin pump 4. zofran ODT called in to pharmacy  Discharge Diagnoses:  Principal Problem:   DKA (diabetic ketoacidoses) Active Problems:   Thyroid nodule   Depression with anxiety   Acute kidney injury   Leukocytosis   Increased anion gap metabolic acidosis   Discharge Condition: good  Diet recommendation: diabetic  Filed Weights   02/15/15 0830 02/16/15 0000  Weight: 73 kg (160 lb 15 oz) 78.1 kg (172 lb 2.9 oz)    History of present illness:   39 year old female with diabetes mellitus type 1, on insulin pump presented with hyperglycemia. It seems her insulin pump was not working prior to coming in. Patient has changes side as well as the tubing. Currently on glucose stabilizer.  Hospital Course:  Assessment & Plan   DKA with anion gap metabolic acidosis -Likely secondary to malfunctioning insulin pump.  -bicarbonate after 48 hours closed to 20 prior to transition -tol full diet on d/c home -Diabetes coordinator input appreciated  Type 1 diabetes mellitus since age 57  insulin according to her insulin pump This was restarted at home dosing which patient continued on d/c home  Leukocytosis -Resolved, likely secondary to DKA -UA negative for infection. Patient denies any urinary symptoms. Denies any cough.  Mild Acute kidney injury -Likely secondary dehydration from DKA -Resolving well  Nausea/Vomiting -Likely secondary to DKA -Continue antiemetic resolving well  Thyroid nodule -Stable, per patient. She has regular thyroid function testing as well as ultrasounds.  Depression -Home medications of Abilify, Klonopin, Cymbalta  initially held due to NPO- -She is able to take them   Left shoulder pain -Patient has history of Frozen shoulder -Continue pain medication PRN      Discharge Exam: Filed Vitals:   02/17/15 0400  BP: 110/61  Pulse: 81  Temp:   Resp: 18    General: L eye has some dilatation which is chr No ict no pallor no n/v/cp Cardiovascular: s1 s2 no m/r/g Respiratory: clear no added sound  Discharge Instructions   Discharge Instructions    Diet - low sodium heart healthy    Complete by:  As directed      Increase activity slowly    Complete by:  As directed           Current Discharge Medication List    START taking these medications   Details  ondansetron (ZOFRAN-ODT) 4 MG disintegrating tablet Take 1 tablet (4 mg total) by mouth once. Qty: 20 tablet, Refills: 0    potassium chloride 20 MEQ TBCR Take 10 mEq by mouth daily. Qty: 7 tablet, Refills: 0      CONTINUE these medications which have NOT CHANGED   Details  ARIPiprazole (ABILIFY) 10 MG tablet Take 20 mg by mouth daily.     clonazePAM (KLONOPIN) 0.5 MG tablet Take 0.5 mg by mouth 3 (three) times daily as needed for anxiety (anxiety). For anxiety    DULoxetine (CYMBALTA) 60 MG capsule Take 60 mg by mouth daily.    Insulin Human (INSULIN PUMP) SOLN Inject into the skin continuous.    levonorgestrel (MIRENA) 20 MCG/24HR IUD 1 each by Intrauterine route once.    Multiple Vitamin (MULTIVITAMIN WITH MINERALS) TABS Take  1 tablet by mouth daily.    naproxen (NAPROSYN) 500 MG tablet Take 500 mg by mouth 2 (two) times daily. Refills: 0    traMADol (ULTRAM) 50 MG tablet Take 50 mg by mouth every 12 (twelve) hours as needed. Pain. Refills: 0       Allergies  Allergen Reactions  . Gluten Meal   . Sulfa Antibiotics Nausea And Vomiting  . Wellbutrin [Bupropion] Other (See Comments)    "Makes me suicidal."  . Amoxicillin Rash  . Codeine Rash      The results of significant diagnostics from this hospitalization  (including imaging, microbiology, ancillary and laboratory) are listed below for reference.    Significant Diagnostic Studies: Dg Shoulder Left  01/24/2015   CLINICAL DATA:  Left shoulder pain. Pain for 1 week, progressive and severe this morning. No known injury.  EXAM: LEFT SHOULDER - 2+ VIEW  COMPARISON:  None.  FINDINGS: No fracture or dislocation. The alignment and joint spaces are maintained. No focal soft tissue calcifications.  IMPRESSION: Negative radiographs of the left shoulder.   Electronically Signed   By: Rubye Oaks M.D.   On: 01/24/2015 06:27    Microbiology: Recent Results (from the past 240 hour(s))  MRSA PCR Screening     Status: None   Collection Time: 02/14/15  8:16 PM  Result Value Ref Range Status   MRSA by PCR NEGATIVE NEGATIVE Final    Comment:        The GeneXpert MRSA Assay (FDA approved for NASAL specimens only), is one component of a comprehensive MRSA colonization surveillance program. It is not intended to diagnose MRSA infection nor to guide or monitor treatment for MRSA infections.      Labs: Basic Metabolic Panel:  Recent Labs Lab 02/16/15 0410 02/16/15 0815 02/16/15 1414 02/16/15 1916 02/17/15 0335  NA 138 137 137 139 139  K 4.2 4.5 4.5 3.9 3.1*  CL 116* 115* 113* 111 110  CO2 17* 16* 16* 21* 20*  GLUCOSE 120* 195* 206* 205* 151*  BUN <5* <5* <5* <5* <5*  CREATININE 0.50 0.62 0.62 0.61 0.57  CALCIUM 7.7* 7.9* 8.6* 8.4* 8.1*   Liver Function Tests: No results for input(s): AST, ALT, ALKPHOS, BILITOT, PROT, ALBUMIN in the last 168 hours. No results for input(s): LIPASE, AMYLASE in the last 168 hours. No results for input(s): AMMONIA in the last 168 hours. CBC:  Recent Labs Lab 02/14/15 1550 02/14/15 2306 02/15/15 0425 02/16/15 0410  WBC 11.0* 10.1 8.8 5.3  HGB 15.9* 13.6 11.6* 11.7*  HCT 48.0* 40.0 35.9* 34.0*  MCV 89.1 87.1 88.2 86.5  PLT 431* 371 317 284   Cardiac Enzymes: No results for input(s): CKTOTAL, CKMB,  CKMBINDEX, TROPONINI in the last 168 hours. BNP: BNP (last 3 results) No results for input(s): BNP in the last 8760 hours.  ProBNP (last 3 results) No results for input(s): PROBNP in the last 8760 hours.  CBG:  Recent Labs Lab 02/17/15 0114 02/17/15 0259 02/17/15 0321 02/17/15 0601 02/17/15 0802  GLUCAP 76 65 114* 112* 72       Signed:  Rhetta Mura  [facility] 02/17/2015, 8:42 AM

## 2015-02-17 NOTE — Progress Notes (Signed)
Pt restarted her insulin pump at 2300 on 02/16/15 per [facility] order using her home settings, pt filled her pump catridge with Novolog insulin provided by pharmacy. IV Insulin drip turned off 2hrs later, CBG was 76 as the time the drip was stopped. Pt will continue to be monitored thru this shift.

## 2016-06-27 ENCOUNTER — Encounter (HOSPITAL_COMMUNITY): Payer: Self-pay | Admitting: Emergency Medicine

## 2016-06-27 ENCOUNTER — Emergency Department (HOSPITAL_COMMUNITY)
Admission: EM | Admit: 2016-06-27 | Discharge: 2016-06-28 | Disposition: A | Discharge status: Home / Self Care | Payer: BC Managed Care – PPO | Attending: Emergency Medicine | Admitting: Emergency Medicine

## 2016-06-27 ENCOUNTER — Emergency Department (HOSPITAL_COMMUNITY): Payer: BC Managed Care – PPO

## 2016-06-27 DIAGNOSIS — E109 Type 1 diabetes mellitus without complications: Secondary | ICD-10-CM | POA: Insufficient documentation

## 2016-06-27 DIAGNOSIS — R0602 Shortness of breath: Secondary | ICD-10-CM | POA: Insufficient documentation

## 2016-06-27 DIAGNOSIS — R059 Cough, unspecified: Secondary | ICD-10-CM

## 2016-06-27 DIAGNOSIS — R112 Nausea with vomiting, unspecified: Secondary | ICD-10-CM | POA: Insufficient documentation

## 2016-06-27 DIAGNOSIS — R0789 Other chest pain: Secondary | ICD-10-CM | POA: Insufficient documentation

## 2016-06-27 DIAGNOSIS — R079 Chest pain, unspecified: Secondary | ICD-10-CM

## 2016-06-27 DIAGNOSIS — R05 Cough: Secondary | ICD-10-CM | POA: Insufficient documentation

## 2016-06-27 LAB — BASIC METABOLIC PANEL
ANION GAP: 16 — AB (ref 5–15)
BUN: 15 mg/dL (ref 6–20)
CALCIUM: 9.3 mg/dL (ref 8.9–10.3)
CO2: 20 mmol/L — ABNORMAL LOW (ref 22–32)
Chloride: 98 mmol/L — ABNORMAL LOW (ref 101–111)
Creatinine, Ser: 0.89 mg/dL (ref 0.44–1.00)
GFR calc Af Amer: >60 mL/min (ref >60)
GLUCOSE: 520 mg/dL — AB (ref 65–99)
Potassium: 4.1 mmol/L (ref 3.5–5.1)
SODIUM: 134 mmol/L — AB (ref 135–145)

## 2016-06-27 LAB — CBC WITH DIFFERENTIAL/PLATELET
BASOS ABS: 0 10*3/uL (ref 0.0–0.1)
BASOS PCT: 0 %
EOS ABS: 0.2 10*3/uL (ref 0.0–0.7)
EOS PCT: 2 %
HCT: 42 % (ref 36.0–46.0)
Hemoglobin: 14.3 g/dL (ref 12.0–15.0)
Lymphocytes Relative: 27 %
Lymphs Abs: 3.3 10*3/uL (ref 0.7–4.0)
MCH: 29.1 pg (ref 26.0–34.0)
MCHC: 34 g/dL (ref 30.0–36.0)
MCV: 85.5 fL (ref 78.0–100.0)
MONO ABS: 0.7 10*3/uL (ref 0.1–1.0)
Monocytes Relative: 6 %
Neutro Abs: 7.9 10*3/uL — ABNORMAL HIGH (ref 1.7–7.7)
Neutrophils Relative %: 65 %
PLATELETS: 495 10*3/uL — AB (ref 150–400)
RBC: 4.91 MIL/uL (ref 3.87–5.11)
RDW: 12.9 % (ref 11.5–15.5)
WBC: 12.2 10*3/uL — AB (ref 4.0–10.5)

## 2016-06-27 LAB — I-STAT BETA HCG BLOOD, ED (MC, WL, AP ONLY)

## 2016-06-27 LAB — LIPASE, BLOOD: LIPASE: 24 U/L (ref 11–51)

## 2016-06-27 LAB — CBG MONITORING, ED: Glucose-Capillary: 507 mg/dL (ref 65–99)

## 2016-06-27 LAB — TROPONIN I

## 2016-06-27 LAB — D-DIMER, QUANTITATIVE: D-Dimer, Quant: <0.27 ug/mL-FEU (ref 0.00–0.50)

## 2016-06-27 MED ORDER — PREDNISONE 20 MG PO TABS
60.0000 mg | ORAL_TABLET | Freq: Once | ORAL | Status: AC
  Administered 2016-06-27: 60 mg via ORAL
  Filled 2016-06-27: qty 3

## 2016-06-27 MED ORDER — ALBUTEROL SULFATE (2.5 MG/3ML) 0.083% IN NEBU
5.0000 mg | INHALATION_SOLUTION | Freq: Once | RESPIRATORY_TRACT | Status: AC
  Administered 2016-06-27: 5 mg via RESPIRATORY_TRACT
  Filled 2016-06-27: qty 6

## 2016-06-27 MED ORDER — ACETAMINOPHEN 325 MG PO TABS
650.0000 mg | ORAL_TABLET | Freq: Once | ORAL | Status: AC
  Administered 2016-06-27: 650 mg via ORAL
  Filled 2016-06-27: qty 2

## 2016-06-27 MED ORDER — SODIUM CHLORIDE 0.9 % IV BOLUS (SEPSIS)
1000.0000 mL | Freq: Once | INTRAVENOUS | Status: AC
  Administered 2016-06-28: 1000 mL via INTRAVENOUS

## 2016-06-27 NOTE — ED Notes (Signed)
IV team at bedside 

## 2016-06-27 NOTE — ED Notes (Signed)
Pt refused this RN to stick her.  Requested IV team.  Reports history of hard sticks.

## 2016-06-27 NOTE — ED Provider Notes (Signed)
[facility]-EMERGENCY DEPT Provider Note   CSN: [id number] Arrival date & time: 06/27/16  1925     History   Chief Complaint Chief Complaint  Patient presents with  . Shortness of Breath    HPI [name] is a 40 y.o. 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  Pt also states she has been nauseated, has vomited all solids since symptoms started. Pt has been tolerating fluids.  Pt was seen by doctor in [place] who diagnosed her with bronchitis and gave her azithromycin and mucinex.  Pt states she finished the antibiotics but her symptoms have only worsened. Pt continues to take mucinex, tylenol, cough drops with no relief. Pt states chest pain is constant but is exacerbated by coughing and taking deep breaths.  Pt states taking deep breaths leads to coughing which leads to chest pain.  Pt drove to the area from [place], car ride was 39 hours in 1.5 days.  Pt denies pulmonary medical problems. Pt has never had PNA, bronchitis, asthma, COPD.  Pt does not smoke. Denies previous h/o PE/DVT.  No hemoptysis, no recent surgery.   HPI  Past Medical History:  Diagnosis Date  . Cluster headache syndrome, intractable   . Depression   . Migraine without aura, with intractable migraine, so stated, without mention of status migrainosus 12/05/2012  . Ovarian cyst   . Type I diabetes mellitus [facility])     Patient Active Problem List   Diagnosis Date Noted  . DKA, type 1 (HCC) 02/14/2015  . DKA (diabetic ketoacidoses) (HCC) 02/14/2015  . Thyroid nodule 02/14/2015  . Depression with anxiety 02/14/2015  . Acute kidney injury (HCC) 02/14/2015  . Leukocytosis 02/14/2015  . Increased anion gap metabolic acidosis 02/14/2015  . Recurrent major depression-severe (HCC) 01/06/2013  . Generalized anxiety disorder 01/06/2013  . Migraine without aura, with intractable migraine, so  stated, without mention of status migrainosus 12/05/2012  . IDDM (insulin dependent diabetes mellitus) (HCC) 12/05/2012    Past Surgical History:  Procedure Laterality Date  . APPENDECTOMY    . BUNIONECTOMY Bilateral   . CHOLECYSTECTOMY    . OVARIAN CYST REMOVAL      OB History    No data available       Home Medications    Prior to Admission medications   Medication Sig Start Date End Date Taking? Authorizing Provider  ARIPiprazole (ABILIFY) 10 MG tablet Take 20 mg by mouth daily.     Historical Provider, MD  clonazePAM (KLONOPIN) 0.5 MG tablet Take 0.5 mg by mouth 3 (three) times daily as needed for anxiety (anxiety). For anxiety 01/09/13   Thermon Leyland, NP  DULoxetine (CYMBALTA) 60 MG capsule Take 60 mg by mouth daily.    Historical Provider, MD  Insulin Human (INSULIN PUMP) SOLN Inject into the skin continuous.    Historical Provider, MD  levonorgestrel (MIRENA) 20 MCG/24HR IUD 1 each by Intrauterine route once.    Historical Provider, MD  Multiple Vitamin (MULTIVITAMIN WITH MINERALS) TABS Take 1 tablet by mouth daily. 01/09/13   Thermon Leyland, NP  naproxen (NAPROSYN) 500 MG tablet Take 500 mg by mouth 2 (two) times daily. 02/11/15   Historical Provider, MD  ondansetron (ZOFRAN-ODT) 4 MG disintegrating tablet Take 1 tablet (4 mg total) by mouth once. 02/17/15   Rhetta Mura, MD  potassium chloride 20 MEQ TBCR Take 10 mEq by mouth  daily. 02/17/15   Rhetta Mura, MD  traMADol (ULTRAM) 50 MG tablet Take 50 mg by mouth every 12 (twelve) hours as needed. Pain. 02/03/15   Historical Provider, MD    Family History Family History  Problem Relation Age of Onset  . Adopted: Yes  . Diabetes Father     Social History Social History  Substance Use Topics  . Smoking status: Never Smoker  . Smokeless tobacco: Never Used  . Alcohol use Yes     Comment: rare     Allergies   Gluten meal; Sulfa antibiotics; Wellbutrin [bupropion]; Amoxicillin; and Codeine   Review of  Systems Review of Systems  Constitutional: Positive for fever. Negative for diaphoresis.  HENT: Positive for congestion and mouth sores. Negative for sinus pain, sinus pressure and sore throat.   Eyes: Positive for visual disturbance (L eye blindness s/p retina detachment).  Respiratory: Positive for cough, chest tightness and shortness of breath.   Cardiovascular: Positive for chest pain. Negative for palpitations and leg swelling.  Gastrointestinal: Positive for nausea and vomiting. Negative for abdominal pain, constipation and diarrhea.  Endocrine: Negative for polydipsia and polyuria.  Genitourinary: Negative for difficulty urinating, dysuria and flank pain.  Musculoskeletal: Negative for back pain.  Skin: Negative for rash.  Neurological: Negative for syncope and headaches.  Hematological: Does not bruise/bleed easily.  Psychiatric/Behavioral: Negative for agitation.     Physical Exam Updated Vital Signs BP 140/100 (BP Location: Left Arm)   Pulse 98   Temp 98.3 F (36.8 C) (Oral)   Resp 16   Ht 5\' 1"  (1.549 m)   Wt 74.8 kg   SpO2 98%   BMI 31.18 kg/m   Physical Exam  Constitutional: She is oriented to person, place, and time. Vital signs are normal. She appears well-developed and well-nourished.  Pt speaking in full sentences in NAD.  HENT:  Head: Normocephalic and atraumatic.  Nose: Nose normal.  Mouth/Throat: Oropharynx is clear and moist. No oropharyngeal exudate.  Tiny, white, tendern lesions on L lateral tongue.   Eyes: Conjunctivae and EOM are normal. Pupils are equal, round, and reactive to light.  Neck: Normal range of motion. Neck supple. No JVD present.  Cardiovascular:  Tachycardic. RRR, S1 and S2 normal. No murmurs, gallops or rubs.  Radial pulses symmetric bilaterally.  Pulmonary/Chest:  Tachypnic.  Breathing effort normal, pt speaking in full sentences.  Lungs CTAB without wheezing, rales, rhonchi.  No chest wall tenderness. Chest expansion symmetric.    Abdominal: Soft. She exhibits no distension. There is no tenderness.  Musculoskeletal: Normal range of motion.  Lymphadenopathy:    She has no cervical adenopathy.  Neurological: She is alert and oriented to person, place, and time.  Skin: Skin is warm and dry.  Psychiatric: She has a normal mood and affect. Her behavior is normal.  Nursing note and vitals reviewed.    ED Treatments / Results  Labs (all labs ordered are listed, but only abnormal results are displayed) Labs Reviewed  CBC WITH DIFFERENTIAL/PLATELET - Abnormal; Notable for the following:       Result Value   WBC 12.2 (*)    Platelets 495 (*)    Neutro Abs 7.9 (*)    All other components within normal limits  BASIC METABOLIC PANEL - Abnormal; Notable for the following:    Sodium 134 (*)    Chloride 98 (*)    CO2 20 (*)    Glucose, Bld 520 (*)    Anion gap 16 (*)  All other components within normal limits  BLOOD GAS, VENOUS - Abnormal; Notable for the following:    pCO2, Ven 42.0 (*)    Acid-base deficit 2.7 (*)    All other components within normal limits  CBG MONITORING, ED - Abnormal; Notable for the following:    Glucose-Capillary 507 (*)    All other components within normal limits  CBG MONITORING, ED - Abnormal; Notable for the following:    Glucose-Capillary 419 (*)    All other components within normal limits  TROPONIN I  LIPASE, BLOOD  D-DIMER, QUANTITATIVE (NOT [facility])  URINALYSIS, ROUTINE W REFLEX MICROSCOPIC  I-STAT BETA HCG BLOOD, ED ([facility], [facility], [facility] ONLY)    EKG  EKG Interpretation  Date/Time:  Wednesday June 27 2016 20:53:33 EST Ventricular Rate:  136 PR Interval:    QRS Duration: 71 QT Interval:  278 QTC Calculation: 419 R Axis:   86 Text Interpretation:  Sinus tachycardia ST-t wave abnormality Abnormal ekg Confirmed by Gerhard MunchLOCKWOOD, ROBERT  MD [phone]([id number]) on 06/27/2016 11:56:57 PM       Radiology Dg Chest 2 View  Result Date: 06/27/2016 CLINICAL DATA:  40 year old with increasing  cough and shortness of breath today. Diagnosed with bronchitis 2 weeks ago. Nonsmoker. EXAM: CHEST  2 VIEW COMPARISON:  Radiographs 11/11/2013. FINDINGS: The heart size and mediastinal contours are stable. The aeration of the lung bases is improved compared with the prior study. There is no airspace disease, edema or pleural effusion. The bones appear unremarkable. IMPRESSION: No active cardiopulmonary process. Electronically Signed   By: Carey BullocksWilliam  Veazey M.D.   On: 06/27/2016 20:53    Procedures Procedures (including critical care time)  Medications Ordered in ED Medications  albuterol (PROVENTIL) (2.5 MG/3ML) 0.083% nebulizer solution 5 mg (5 mg Nebulization Given 06/27/16 2013)  predniSONE (DELTASONE) tablet 60 mg (60 mg Oral Given 06/27/16 2159)  acetaminophen (TYLENOL) tablet 650 mg (650 mg Oral Given 06/27/16 2212)  sodium chloride 0.9 % bolus 1,000 mL (0 mLs Intravenous Stopped 06/28/16 0116)     Initial Impression / Assessment and Plan / ED Course  I have reviewed the triage vital signs and the nursing notes.  Pertinent labs & imaging results that were available during my care of the patient were reviewed by me and considered in my medical decision making (see chart for details).  Clinical Course as of Jun 29 123  Wed Jun 27, 2016  2321 Pt states she is 3 hr late on taking her home insulin. Pt instructed to self administer insulin  [CG]    Clinical Course User Index [CG] Liberty Handylaudia J Athalie Newhard, PA-C    Initially concerned for PNA, PE, ACS, DKA or abdominal emergency given presenting symptoms of cough, fever, chest pain, shortness of breath, nausea and vomiting in a T1DM patient with recent prolonged travel from [place].  Comprehensive ED work up, only remarkable findings include hyperglycemia of 520 on BMP.  VBG remarkable for slightly elevated AB deficit, 2.7.  Pt had missed home dose of insulin by 3 hours when labs were drawn. Pt administered her insulin 2 hours prior to repeat  CBG. Repeat CBG 419.  U/A had not resulted before pt requested to leave stating she wants to go home because she is tired.  HCG, lipase, troponin, d-dimer,EKG and CXR unremarkable.   Pt was given oral and IV fluids, tylenol, albuterol and prednisone in ED. Given work up negative for PNA, PE, ACS pt's symptoms are likely viral.  Pt instructed to startconservative treatment for cough,  fever and nausea.  Strict ED precautions given.  Pt verbalized understanding prior to leaving ED. Pt discussed with Dr. Jeraldine LootsLockwood who assisted in MDM.  Final Clinical Impressions(s) / ED Diagnoses   Final diagnoses:  Shortness of breath  Cough  Chest pain, unspecified type  Nausea and vomiting, intractability of vomiting not specified, unspecified vomiting type    New Prescriptions Discharge Medication List as of 06/28/2016  1:22 AM       Liberty Handylaudia J Tadarrius Burch, PA-C 06/28/16 0124    Gerhard Munchobert Lockwood, MD 06/29/16 [phone]

## 2016-06-27 NOTE — ED Triage Notes (Signed)
Pt states she was diagnosed with bronchitis 2 months ago and was given Mucinex and a z pack  Pt states she completed the zpack and is still using the mucinex  Pt states she is short of breath and has a cough  Pt states every now and then she can get up some green sputum

## 2016-06-28 LAB — URINALYSIS, ROUTINE W REFLEX MICROSCOPIC
Bacteria, UA: NONE SEEN
Bilirubin Urine: NEGATIVE
HGB URINE DIPSTICK: NEGATIVE
Ketones, ur: 80 mg/dL — AB
Leukocytes, UA: NEGATIVE
Nitrite: NEGATIVE
PH: 5 (ref 5.0–8.0)
Protein, ur: NEGATIVE mg/dL
RBC / HPF: NONE SEEN RBC/hpf (ref 0–5)
SPECIFIC GRAVITY, URINE: 1.03 (ref 1.005–1.030)

## 2016-06-28 LAB — BLOOD GAS, VENOUS
Acid-base deficit: 2.7 mmol/L — ABNORMAL HIGH (ref 0.0–2.0)
BICARBONATE: 22.4 mmol/L (ref 20.0–28.0)
FIO2: 21
O2 Saturation: 40.2 %
PCO2 VEN: 42 mmHg — AB (ref 44.0–60.0)
PH VEN: 7.345 (ref 7.250–7.430)
Patient temperature: 98.3

## 2016-06-28 LAB — CBG MONITORING, ED: Glucose-Capillary: 419 mg/dL — ABNORMAL HIGH (ref 65–99)

## 2016-06-28 NOTE — ED Notes (Addendum)
Pt states she would like to leave against medical advice. EDP notified. Pt educated the risks of leaving prior to being medically cleared. Pt understood these risks. Pt refused dc vitals, but consented to sign form stating she was leaving against medial advise

## 2017-01-04 ENCOUNTER — Encounter (HOSPITAL_COMMUNITY): Payer: Self-pay

## 2017-01-04 ENCOUNTER — Emergency Department (HOSPITAL_COMMUNITY)
Admission: EM | Admit: 2017-01-04 | Discharge: 2017-01-04 | Disposition: A | Discharge status: Home / Self Care | Payer: Self-pay | Attending: Emergency Medicine | Admitting: Emergency Medicine

## 2017-01-04 ENCOUNTER — Emergency Department (HOSPITAL_COMMUNITY): Payer: Self-pay

## 2017-01-04 DIAGNOSIS — R1012 Left upper quadrant pain: Secondary | ICD-10-CM

## 2017-01-04 DIAGNOSIS — E109 Type 1 diabetes mellitus without complications: Secondary | ICD-10-CM | POA: Insufficient documentation

## 2017-01-04 DIAGNOSIS — Z79899 Other long term (current) drug therapy: Secondary | ICD-10-CM | POA: Insufficient documentation

## 2017-01-04 DIAGNOSIS — Z87442 Personal history of urinary calculi: Secondary | ICD-10-CM | POA: Insufficient documentation

## 2017-01-04 DIAGNOSIS — K529 Noninfective gastroenteritis and colitis, unspecified: Secondary | ICD-10-CM | POA: Insufficient documentation

## 2017-01-04 DIAGNOSIS — Z794 Long term (current) use of insulin: Secondary | ICD-10-CM | POA: Insufficient documentation

## 2017-01-04 LAB — CBC WITH DIFFERENTIAL/PLATELET
Basophils Absolute: 0 10*3/uL (ref 0.0–0.1)
Basophils Relative: 0 %
EOS ABS: 0.3 10*3/uL (ref 0.0–0.7)
EOS PCT: 2 %
HCT: 39.1 % (ref 36.0–46.0)
Hemoglobin: 13.5 g/dL (ref 12.0–15.0)
LYMPHS ABS: 3.6 10*3/uL (ref 0.7–4.0)
Lymphocytes Relative: 31 %
MCH: 29.6 pg (ref 26.0–34.0)
MCHC: 34.5 g/dL (ref 30.0–36.0)
MCV: 85.7 fL (ref 78.0–100.0)
MONO ABS: 0.9 10*3/uL (ref 0.1–1.0)
MONOS PCT: 8 %
Neutro Abs: 6.9 10*3/uL (ref 1.7–7.7)
Neutrophils Relative %: 59 %
PLATELETS: 389 10*3/uL (ref 150–400)
RBC: 4.56 MIL/uL (ref 3.87–5.11)
RDW: 12.8 % (ref 11.5–15.5)
WBC: 11.7 10*3/uL — ABNORMAL HIGH (ref 4.0–10.5)

## 2017-01-04 LAB — URINALYSIS, ROUTINE W REFLEX MICROSCOPIC
Bilirubin Urine: NEGATIVE
HGB URINE DIPSTICK: NEGATIVE
Ketones, ur: NEGATIVE mg/dL
Leukocytes, UA: NEGATIVE
Nitrite: NEGATIVE
Protein, ur: NEGATIVE mg/dL
SPECIFIC GRAVITY, URINE: 1.007 (ref 1.005–1.030)
pH: 6 (ref 5.0–8.0)

## 2017-01-04 LAB — BASIC METABOLIC PANEL
Anion gap: 8 (ref 5–15)
BUN: 15 mg/dL (ref 6–20)
CHLORIDE: 103 mmol/L (ref 101–111)
CO2: 24 mmol/L (ref 22–32)
CREATININE: 0.77 mg/dL (ref 0.44–1.00)
Calcium: 9.1 mg/dL (ref 8.9–10.3)
GFR calc Af Amer: >60 mL/min (ref >60)
GFR calc non Af Amer: >60 mL/min (ref >60)
Glucose, Bld: 86 mg/dL (ref 65–99)
Potassium: 3.5 mmol/L (ref 3.5–5.1)
Sodium: 135 mmol/L (ref 135–145)

## 2017-01-04 LAB — PREGNANCY, URINE: PREG TEST UR: NEGATIVE

## 2017-01-04 MED ORDER — INSULIN ASPART 100 UNIT/ML ~~LOC~~ SOLN: 10.0000 [IU] | Freq: Once | SUBCUTANEOUS | Status: DC

## 2017-01-04 MED ORDER — MORPHINE SULFATE (PF) 2 MG/ML IV SOLN
4.0000 mg | Freq: Once | INTRAVENOUS | Status: AC
  Administered 2017-01-04: 4 mg via INTRAVENOUS
  Filled 2017-01-04: qty 2

## 2017-01-04 MED ORDER — ONDANSETRON 8 MG PO TBDP: ORAL_TABLET | ORAL | 0 refills | Status: DC

## 2017-01-04 MED ORDER — HYDROCODONE-ACETAMINOPHEN 5-325 MG PO TABS: 1.0000 | ORAL_TABLET | Freq: Four times a day (QID) | ORAL | 0 refills | Status: DC | PRN

## 2017-01-04 MED ORDER — KETOROLAC TROMETHAMINE 30 MG/ML IJ SOLN
30.0000 mg | Freq: Once | INTRAMUSCULAR | Status: AC
  Administered 2017-01-04: 30 mg via INTRAVENOUS
  Filled 2017-01-04: qty 1

## 2017-01-04 MED ORDER — SODIUM CHLORIDE 0.9 % IV BOLUS (SEPSIS): 1000.0000 mL | Freq: Once | INTRAVENOUS | Status: DC

## 2017-01-04 MED ORDER — ONDANSETRON HCL 4 MG/2ML IJ SOLN
4.0000 mg | Freq: Once | INTRAMUSCULAR | Status: AC
  Administered 2017-01-04: 4 mg via INTRAVENOUS
  Filled 2017-01-04: qty 2

## 2017-01-04 NOTE — Discharge Instructions (Signed)
Hydrocodone as prescribed as needed for pain.  Zofran as prescribed as needed for nausea.  Return to the emergency department if you develop worsening pain, high fevers, bloody stool or vomit, or other new and concerning symptoms.  Radiologist has recommended you follow-up with your primary Dr. in approximately 2 months to schedule a repeat CAT scan to ensure resolution of your abnormal findings.

## 2017-01-04 NOTE — ED Provider Notes (Signed)
[facility]-EMERGENCY DEPT Provider Note   CSN: [id number] Arrival date & time: 01/04/17  0215 By signing my name below, I, Levon HedgerElizabeth Hall, attest that this documentation has been prepared under the direction and in the presence of Geoffery Lyonselo, Marc Sivertsen, MD . Electronically Signed: Levon HedgerElizabeth Hall, Scribe. 01/04/2017. 3:45 AM   History   Chief Complaint Chief Complaint  Patient presents with  . Flank Pain   HPI Belenda CruiseCheri D. Vicente is a 41 y.o. female with a history of DM, AKI, and leukocytosis who presents to the Emergency Department complaining of sudden onset, constant left flank pain onset tonight.  She reports associated nausea and vomiting tonight. Pt has taken Zofran with no relief of symptoms. No alleviating or modifying factors noted. Pt states his pain is similar to prior kidney stones. Pt is currently on Mirena and does not have regular periods. She denies any chance that she may be pregnant. She is unsure of hematuria and denies any dysuria, diarrhea or constipation. Pt has no other acute complaints or associated symptoms at this time.    The history is provided by the patient. No language interpreter was used.   Past Medical History:  Diagnosis Date  . Cluster headache syndrome, intractable   . Depression   . Migraine without aura, with intractable migraine, so stated, without mention of status migrainosus 12/05/2012  . Ovarian cyst   . Type I diabetes mellitus [facility](HCC)     Patient Active Problem List   Diagnosis Date Noted  . DKA, type 1 (HCC) 02/14/2015  . DKA (diabetic ketoacidoses) (HCC) 02/14/2015  . Thyroid nodule 02/14/2015  . Depression with anxiety 02/14/2015  . Acute kidney injury (HCC) 02/14/2015  . Leukocytosis 02/14/2015  . Increased anion gap metabolic acidosis 02/14/2015  . Recurrent major depression-severe (HCC) 01/06/2013  . Generalized anxiety disorder 01/06/2013  . Migraine without aura, with intractable migraine, so stated, without mention of status migrainosus 12/05/2012    . IDDM (insulin dependent diabetes mellitus) (HCC) 12/05/2012    Past Surgical History:  Procedure Laterality Date  . APPENDECTOMY    . BUNIONECTOMY Bilateral   . CHOLECYSTECTOMY    . OVARIAN CYST REMOVAL      OB History    No data available      Home Medications    Prior to Admission medications   Medication Sig Start Date End Date Taking? Authorizing Provider  ARIPiprazole (ABILIFY) 10 MG tablet Take 20 mg by mouth daily.     [provider]  clonazePAM (KLONOPIN) 0.5 MG tablet Take 0.5 mg by mouth 3 (three) times daily as needed for anxiety (anxiety). For anxiety 01/09/13   Thermon Leylandavis, Laura A, NP  DULoxetine (CYMBALTA) 60 MG capsule Take 60 mg by mouth daily.    [provider]  Insulin Human (INSULIN PUMP) SOLN Inject into the skin continuous.    [provider]  levonorgestrel (MIRENA) 20 MCG/24HR IUD 1 each by Intrauterine route once.    [provider]  Multiple Vitamin (MULTIVITAMIN WITH MINERALS) TABS Take 1 tablet by mouth daily. 01/09/13   Thermon Leylandavis, Laura A, NP  naproxen (NAPROSYN) 500 MG tablet Take 500 mg by mouth 2 (two) times daily. 02/11/15   [provider]  ondansetron (ZOFRAN-ODT) 4 MG disintegrating tablet Take 1 tablet (4 mg total) by mouth once. 02/17/15   Rhetta MuraSamtani, Jai-Gurmukh, MD  potassium chloride 20 MEQ TBCR Take 10 mEq by mouth daily. 02/17/15   Rhetta MuraSamtani, Jai-Gurmukh, MD  traMADol (ULTRAM) 50 MG tablet Take 50 mg by mouth every  12 (twelve) hours as needed. Pain. 02/03/15   [provider]    Family History Family History  Problem Relation Age of Onset  . Adopted: Yes  . Diabetes Father     Social History Social History  Substance Use Topics  . Smoking status: Never Smoker  . Smokeless tobacco: Never Used  . Alcohol use Yes     Comment: rare    Allergies   Gluten meal; Sulfa antibiotics; Wellbutrin [bupropion]; Amoxicillin; and Codeine   Review of Systems Review of Systems All systems reviewed and  are negative for acute change except as noted in the HPI.  Physical Exam Updated Vital Signs BP (!) 143/78 (BP Location: Left Arm)   Pulse (!) 120   Temp 98.7 F (37.1 C) (Oral)   Resp 18   SpO2 98%   Physical Exam  Constitutional: She is oriented to person, place, and time. She appears well-developed and well-nourished. No distress.  Pt writhing and appears uncomfortable  HENT:  Head: Normocephalic and atraumatic.  Eyes: EOM are normal.  Neck: Normal range of motion.  Cardiovascular: Normal rate, regular rhythm and normal heart sounds.   Pulmonary/Chest: Effort normal and breath sounds normal.  Abdominal: Soft. She exhibits no distension. There is tenderness.  There is left sided CVA tenderness.   Musculoskeletal: Normal range of motion.  Neurological: She is alert and oriented to person, place, and time.  Skin: Skin is warm and dry.  Psychiatric: She has a normal mood and affect. Judgment normal.  Nursing note and vitals reviewed.  ED Treatments / Results  DIAGNOSTIC STUDIES:  Oxygen Saturation is 98% on RA, normal by my interpretation.    COORDINATION OF CARE:  2:43 AM Discussed treatment plan with pt at bedside and pt agreed to plan.   Labs (all labs ordered are listed, but only abnormal results are displayed) Labs Reviewed  CBC WITH DIFFERENTIAL/PLATELET - Abnormal; Notable for the following:       Result Value   WBC 11.7 (*)    All other components within normal limits  URINALYSIS, ROUTINE W REFLEX MICROSCOPIC - Abnormal; Notable for the following:    Color, Urine STRAW (*)    Glucose, UA >=500 (*)    Bacteria, UA RARE (*)    Squamous Epithelial / LPF 0-5 (*)    All other components within normal limits  BASIC METABOLIC PANEL  PREGNANCY, URINE    EKG  EKG Interpretation None       Radiology Ct Renal Stone Study  Result Date: 01/04/2017 CLINICAL DATA:  Left flank pain.  History renal calculi. EXAM: CT ABDOMEN AND PELVIS WITHOUT CONTRAST TECHNIQUE:  Multidetector CT imaging of the abdomen and pelvis was performed following the standard protocol without IV contrast. COMPARISON:  CTA 11/11/2013 FINDINGS: Lower chest: The lung bases are clear. No consolidation or pleural fluid. Hepatobiliary: No evidence of focal hepatic lesion allowing for lack contrast. Gallbladder is surgically absent. No biliary dilatation. Pancreas: No ductal dilatation or inflammation. Spleen: Normal in size without focal abnormality. Adrenals/Urinary Tract: Normal adrenal glands. No hydronephrosis or perinephric edema. No urolithiasis. Ureters are decompressed without stones along the course. Urinary bladder is physiologically distended, no bladder wall thickening or bladder stone. Stomach/Bowel: Stomach physiologically distended with ingested material. Moderate length segment of mild small bowel thickening involving left upper quadrant small bowel loops. Mild adjacent mesenteric edema. There are enlarged regional mesenteric nodes measuring up to 9 mm. No obstruction. More distal small bowel is decompressed. Moderate colonic stool burden. Surgical  clips at the base of the cecum from appendectomy. Vascular/Lymphatic: Enlarged left mesenteric nodes in the region of abnormal small bowel. No retroperitoneal adenopathy. Scattered atherosclerosis of the abdominal aorta and its branches. Reproductive: IUD appropriately positioned in the uterus. Ovaries are symmetric in size. No adnexal mass. Other: No free air, free fluid, or intra-abdominal fluid collection. 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 There are no acute or suspicious osseous abnormalities. IMPRESSION: 1. Small bowel thickening with minimal mesenteric edema and mild adjacent adenopathy in the left upper quadrant. Suspect enteritis, may be infectious or inflammatory. Adjacent lymph nodes are likely reactive, however a follow-up contrast-enhanced CT could be considered in 6-12 weeks to ensure resolution. 2.   No renal stones or obstructive uropathy. Electronically Signed   By: Rubye Oaks M.D.   On: 01/04/2017 03:40    Procedures Procedures (including critical care time)  Medications Ordered in ED Medications  morphine 2 MG/ML injection 4 mg (4 mg Intravenous Given 01/04/17 0318)  ondansetron (ZOFRAN) injection 4 mg (4 mg Intravenous Given 01/04/17 0317)  ketorolac (TORADOL) 30 MG/ML injection 30 mg (30 mg Intravenous Given 01/04/17 0317)     Initial Impression / Assessment and Plan / ED Course  I have reviewed the triage vital signs and the nursing notes.  Pertinent labs & imaging results that were available during my care of the patient were reviewed by me and considered in my medical decision making (see chart for details).  Patient presents with left sided flank pain that started acutely this evening. She has a history of renal calculi and this feels similar. Today's workup reveals clear urinalysis, unremarkable electrolytes, and slight white count of 11.7. Her CT scan shows no evidence for renal calculi or obstruction. It does show what appears to be enteritis in the left upper quadrant which I suspect is the cause. Will be given pain medicine, nausea medicine, and is to follow-up with her primary Dr. in the next 2 months for a repeat CT scan to ensure resolution as per radiology recommendations.  Final Clinical Impressions(s) / ED Diagnoses   Final diagnoses:  None    New Prescriptions New Prescriptions   No medications on file   I personally performed the services described in this documentation, which was scribed in my presence. The recorded information has been reviewed and is accurate.        Geoffery Lyons, MD 01/04/17 [phone]

## 2017-01-04 NOTE — ED Triage Notes (Signed)
Pt complains of left flank pain Hx of kidney stones and the pain feels the same Pt has vomited since in the ED

## 2017-01-08 ENCOUNTER — Emergency Department (HOSPITAL_COMMUNITY)
Admission: EM | Admit: 2017-01-08 | Discharge: 2017-01-08 | Disposition: A | Discharge status: Home / Self Care | Payer: BC Managed Care – PPO | Attending: Emergency Medicine | Admitting: Emergency Medicine

## 2017-01-08 ENCOUNTER — Encounter (HOSPITAL_COMMUNITY): Payer: Self-pay

## 2017-01-08 DIAGNOSIS — K529 Noninfective gastroenteritis and colitis, unspecified: Secondary | ICD-10-CM

## 2017-01-08 DIAGNOSIS — Z79899 Other long term (current) drug therapy: Secondary | ICD-10-CM | POA: Insufficient documentation

## 2017-01-08 DIAGNOSIS — E1010 Type 1 diabetes mellitus with ketoacidosis without coma: Secondary | ICD-10-CM | POA: Insufficient documentation

## 2017-01-08 DIAGNOSIS — Z794 Long term (current) use of insulin: Secondary | ICD-10-CM | POA: Insufficient documentation

## 2017-01-08 DIAGNOSIS — R739 Hyperglycemia, unspecified: Secondary | ICD-10-CM

## 2017-01-08 DIAGNOSIS — E860 Dehydration: Secondary | ICD-10-CM | POA: Insufficient documentation

## 2017-01-08 LAB — CBC
HEMATOCRIT: 43.4 % (ref 36.0–46.0)
HEMOGLOBIN: 14.8 g/dL (ref 12.0–15.0)
MCH: 29.2 pg (ref 26.0–34.0)
MCHC: 34.1 g/dL (ref 30.0–36.0)
MCV: 85.8 fL (ref 78.0–100.0)
Platelets: 360 10*3/uL (ref 150–400)
RBC: 5.06 MIL/uL (ref 3.87–5.11)
RDW: 12.7 % (ref 11.5–15.5)
WBC: 9.1 10*3/uL (ref 4.0–10.5)

## 2017-01-08 LAB — BASIC METABOLIC PANEL
ANION GAP: 11 (ref 5–15)
BUN: 16 mg/dL (ref 6–20)
CALCIUM: 8.9 mg/dL (ref 8.9–10.3)
CO2: 21 mmol/L — ABNORMAL LOW (ref 22–32)
Chloride: 101 mmol/L (ref 101–111)
Creatinine, Ser: 0.75 mg/dL (ref 0.44–1.00)
GFR calc Af Amer: >60 mL/min (ref >60)
Glucose, Bld: 376 mg/dL — ABNORMAL HIGH (ref 65–99)
POTASSIUM: 4.7 mmol/L (ref 3.5–5.1)
SODIUM: 133 mmol/L — AB (ref 135–145)

## 2017-01-08 LAB — CBG MONITORING, ED
GLUCOSE-CAPILLARY: 299 mg/dL — AB (ref 65–99)
Glucose-Capillary: 394 mg/dL — ABNORMAL HIGH (ref 65–99)
Glucose-Capillary: 278 mg/dL — ABNORMAL HIGH (ref 65–99)
Glucose-Capillary: 170 mg/dL — ABNORMAL HIGH (ref 65–99)

## 2017-01-08 LAB — HEPATIC FUNCTION PANEL
ALBUMIN: 3.8 g/dL (ref 3.5–5.0)
ALK PHOS: 72 U/L (ref 38–126)
ALT: 14 U/L (ref 14–54)
AST: 22 U/L (ref 15–41)
BILIRUBIN TOTAL: 1.1 mg/dL (ref 0.3–1.2)
Bilirubin, Direct: 0.2 mg/dL (ref 0.1–0.5)
Indirect Bilirubin: 0.9 mg/dL (ref 0.3–0.9)
TOTAL PROTEIN: 7.6 g/dL (ref 6.5–8.1)

## 2017-01-08 LAB — BLOOD GAS, VENOUS
ACID-BASE DEFICIT: 3.1 mmol/L — AB (ref 0.0–2.0)
BICARBONATE: 21.8 mmol/L (ref 20.0–28.0)
O2 SAT: 69.5 %
PATIENT TEMPERATURE: 98.6
PO2 VEN: 36.5 mmHg (ref 32.0–45.0)
pCO2, Ven: 40.8 mmHg — ABNORMAL LOW (ref 44.0–60.0)
pH, Ven: 7.348 (ref 7.250–7.430)

## 2017-01-08 LAB — URINALYSIS, ROUTINE W REFLEX MICROSCOPIC
Bilirubin Urine: NEGATIVE
Ketones, ur: 80 mg/dL — AB
LEUKOCYTES UA: NEGATIVE
NITRITE: NEGATIVE
PH: 5 (ref 5.0–8.0)
PROTEIN: NEGATIVE mg/dL
SPECIFIC GRAVITY, URINE: 1.035 — AB (ref 1.005–1.030)

## 2017-01-08 LAB — DIFFERENTIAL
BASOS PCT: 0 %
Basophils Absolute: 0 10*3/uL (ref 0.0–0.1)
EOS ABS: 0.1 10*3/uL (ref 0.0–0.7)
Eosinophils Relative: 1 %
LYMPHS ABS: 1.5 10*3/uL (ref 0.7–4.0)
Lymphocytes Relative: 17 %
MONO ABS: 0.4 10*3/uL (ref 0.1–1.0)
MONOS PCT: 5 %
Neutro Abs: 7 10*3/uL (ref 1.7–7.7)
Neutrophils Relative %: 77 %

## 2017-01-08 LAB — LIPASE, BLOOD: Lipase: 12 U/L (ref 11–51)

## 2017-01-08 MED ORDER — FENTANYL CITRATE (PF) 100 MCG/2ML IJ SOLN
50.0000 ug | Freq: Once | INTRAMUSCULAR | Status: AC
  Administered 2017-01-08: 50 ug via INTRAVENOUS
  Filled 2017-01-08: qty 2

## 2017-01-08 MED ORDER — SODIUM CHLORIDE 0.9 % IV BOLUS (SEPSIS)
1000.0000 mL | Freq: Once | INTRAVENOUS | Status: AC
  Administered 2017-01-08: 1000 mL via INTRAVENOUS

## 2017-01-08 MED ORDER — PROMETHAZINE HCL 25 MG/ML IJ SOLN
12.5000 mg | Freq: Once | INTRAMUSCULAR | Status: AC
  Administered 2017-01-08: 12.5 mg via INTRAVENOUS
  Filled 2017-01-08: qty 1

## 2017-01-08 MED ORDER — INSULIN ASPART 100 UNIT/ML ~~LOC~~ SOLN
4.0000 [IU] | Freq: Once | SUBCUTANEOUS | Status: AC
  Administered 2017-01-08: 4 [IU] via INTRAVENOUS
  Filled 2017-01-08: qty 1

## 2017-01-08 MED ORDER — PROMETHAZINE HCL 25 MG PO TABS: 25.0000 mg | ORAL_TABLET | Freq: Three times a day (TID) | ORAL | 0 refills | Status: DC | PRN

## 2017-01-08 NOTE — ED Provider Notes (Signed)
[facility]-EMERGENCY DEPT Provider Note   CSN: [id number] Arrival date & time: 01/08/17  4098     History   Chief Complaint Chief Complaint  Patient presents with  . Hyperglycemia  . Abdominal Pain  . Nausea    HPI [name] is a 41 y.o. female.  HPI Patient presents with abdominal pain. Had recent visits to the ER 4 days ago for flank pain and abdominal pain. Diagnosed with enteritis after CT scan. Pain continues and is possibly worsen. Went from left flank to more in the anterior mid abdomen. Has had nausea with slight vomiting. No diarrhea. Comes patient. No fevers. Pain is worse after eating. Decreased appetite. Sugars been going more out of control 2. States sugars were 540 last night. Past Medical History:  Diagnosis Date  . Cluster headache syndrome, intractable   . Depression   . Migraine without aura, with intractable migraine, so stated, without mention of status migrainosus 12/05/2012  . Ovarian cyst   . Type I diabetes mellitus [facility])     Patient Active Problem List   Diagnosis Date Noted  . DKA, type 1 (HCC) 02/14/2015  . DKA (diabetic ketoacidoses) (HCC) 02/14/2015  . Thyroid nodule 02/14/2015  . Depression with anxiety 02/14/2015  . Acute kidney injury (HCC) 02/14/2015  . Leukocytosis 02/14/2015  . Increased anion gap metabolic acidosis 02/14/2015  . Recurrent major depression-severe (HCC) 01/06/2013  . Generalized anxiety disorder 01/06/2013  . Migraine without aura, with intractable migraine, so stated, without mention of status migrainosus 12/05/2012  . IDDM (insulin dependent diabetes mellitus) (HCC) 12/05/2012    Past Surgical History:  Procedure Laterality Date  . APPENDECTOMY    . BUNIONECTOMY Bilateral   . CHOLECYSTECTOMY    . OVARIAN CYST REMOVAL      OB History    No data available       Home Medications    Prior to Admission medications   Medication Sig Start Date End Date Taking? Authorizing Provider  ARIPiprazole (ABILIFY) 20  MG tablet Take 20 mg by mouth daily.   Yes [provider]  clonazePAM (KLONOPIN) 0.5 MG tablet Take 0.5 mg by mouth 3 (three) times daily as needed for anxiety. For anxiety 01/09/13  Yes Thermon Leyland, NP  DULoxetine (CYMBALTA) 30 MG capsule Take 30 mg by mouth daily. Takes with 60 mg to make a total of 90 mg a day   Yes [provider]  DULoxetine (CYMBALTA) 60 MG capsule Take 60 mg by mouth daily. Takes with 30 mg to make a total of 90 mg a day   Yes [provider]  insulin glargine (LANTUS) 100 UNIT/ML injection Inject 50 Units into the skin at bedtime.   Yes [provider]  insulin lispro (HUMALOG) 100 UNIT/ML injection Inject 2-15 Units into the skin 3 (three) times daily before meals. On Sliding scale   Yes [provider]  levonorgestrel (MIRENA) 20 MCG/24HR IUD 1 each by Intrauterine route once. Placed 06/2014   Yes [provider]  zolpidem (AMBIEN) 10 MG tablet Take 10 mg by mouth at bedtime.   Yes [provider]  HYDROcodone-acetaminophen (NORCO) 5-325 MG tablet Take 1-2 tablets by mouth every 6 (six) hours as needed. Patient not taking: Reported on 01/08/2017 01/04/17   Geoffery Lyons, MD  Multiple Vitamin (MULTIVITAMIN WITH MINERALS) TABS Take 1 tablet by mouth daily. Patient not taking: Reported on 01/08/2017 01/09/13   Thermon Leyland, NP  ondansetron (ZOFRAN ODT) 8 MG disintegrating tablet 8mg   ODT q4 hours prn nausea Patient not taking: Reported on 01/08/2017 01/04/17   Geoffery Lyons, MD  potassium chloride 20 MEQ TBCR Take 10 mEq by mouth daily. Patient not taking: Reported on 01/08/2017 02/17/15   Rhetta Mura, MD  promethazine (PHENERGAN) 25 MG tablet Take 1 tablet (25 mg total) by mouth every 8 (eight) hours as needed for nausea or vomiting. 01/08/17   Benjiman Core, MD    Family History Family History  Problem Relation Age of Onset  . Adopted: Yes  . Diabetes Father     Social History Social History    Substance Use Topics  . Smoking status: Never Smoker  . Smokeless tobacco: Never Used  . Alcohol use Yes     Comment: rare     Allergies   Gluten meal; Sulfa antibiotics; Wellbutrin [bupropion]; Amoxicillin; and Codeine   Review of Systems Review of Systems  Constitutional: Positive for appetite change. Negative for fever.  Gastrointestinal: Positive for abdominal pain, nausea and vomiting.  Genitourinary: Negative for flank pain.  Musculoskeletal: Negative for back pain.  Skin: Negative for pallor.  Neurological: Negative for seizures.  Hematological: Negative for adenopathy.  Psychiatric/Behavioral: Negative for confusion.     Physical Exam Updated Vital Signs BP 125/76 (BP Location: Left Arm)   Pulse 89   Temp 98.4 F (36.9 C) (Oral)   Resp 14   Ht 5\' 1"  (1.549 m)   Wt 74.8 kg (165 lb)   SpO2 99%   BMI 31.18 kg/m   Physical Exam  Constitutional: She appears well-developed.  HENT:  Head: Normocephalic.  Lips are dry  Eyes: Pupils are equal, round, and reactive to light.  Neck: Neck supple.  Cardiovascular:  Mild tachycardia  Pulmonary/Chest: Effort normal.  Abdominal: There is tenderness.  Epigastric to left upper quadrant tenderness. No rebound or guarding. No mass.  Musculoskeletal: She exhibits no tenderness.  Neurological: She is alert.  Skin: Skin is warm. Capillary refill takes less than 2 seconds.  Psychiatric: She has a normal mood and affect.     ED Treatments / Results  Labs (all labs ordered are listed, but only abnormal results are displayed) Labs Reviewed  BASIC METABOLIC PANEL - Abnormal; Notable for the following:       Result Value   Sodium 133 (*)    CO2 21 (*)    Glucose, Bld 376 (*)    All other components within normal limits  URINALYSIS, ROUTINE W REFLEX MICROSCOPIC - Abnormal; Notable for the following:    APPearance CLOUDY (*)    Specific Gravity, Urine 1.035 (*)    Glucose, UA >=500 (*)    Hgb urine dipstick SMALL (*)     Ketones, ur 80 (*)    Bacteria, UA FEW (*)    Squamous Epithelial / LPF 6-30 (*)    All other components within normal limits  BLOOD GAS, VENOUS - Abnormal; Notable for the following:    pCO2, Ven 40.8 (*)    Acid-base deficit 3.1 (*)    All other components within normal limits  CBG MONITORING, ED - Abnormal; Notable for the following:    Glucose-Capillary 299 (*)    All other components within normal limits  CBG MONITORING, ED - Abnormal; Notable for the following:    Glucose-Capillary 394 (*)    All other components within normal limits  CBG MONITORING, ED - Abnormal; Notable for the following:    Glucose-Capillary 278 (*)    All other components within normal limits  CBG  MONITORING, ED - Abnormal; Notable for the following:    Glucose-Capillary 170 (*)    All other components within normal limits  CBC  HEPATIC FUNCTION PANEL  LIPASE, BLOOD  DIFFERENTIAL    EKG  EKG Interpretation None       Radiology No results found.  Procedures Procedures (including critical care time)  Medications Ordered in ED Medications  sodium chloride 0.9 % bolus 1,000 mL (0 mLs Intravenous Stopped 01/08/17 1237)  promethazine (PHENERGAN) injection 12.5 mg (12.5 mg Intravenous Given 01/08/17 1022)  sodium chloride 0.9 % bolus 1,000 mL (0 mLs Intravenous Stopped 01/08/17 1359)  sodium chloride 0.9 % bolus 1,000 mL (0 mLs Intravenous Stopped 01/08/17 1544)  insulin aspart (novoLOG) injection 4 Units (4 Units Intravenous Given 01/08/17 1351)  fentaNYL (SUBLIMAZE) injection 50 mcg (50 mcg Intravenous Given 01/08/17 1352)     Initial Impression / Assessment and Plan / ED Course  I have reviewed the triage vital signs and the nursing notes.  Pertinent labs & imaging results that were available during my care of the patient were reviewed by me and considered in my medical decision making (see chart for details).     Patient with nausea hyperglycemia and abdominal pain. Recently diagnosed  with enteritis on CT scan. Initial CBG elevated but is improved. Labs otherwise reassuring. Initially tachycardic has improved with 3 L of IV fluid. Has ketones in the urine but thinks is related to dehydration as opposed to DKA. Feels better and has tolerated orals. Will discharge home.  Final Clinical Impressions(s) / ED Diagnoses   Final diagnoses:  Enteritis  Hyperglycemia  Dehydration    New Prescriptions Discharge Medication List as of 01/08/2017  3:38 PM    START taking these medications   Details  promethazine (PHENERGAN) 25 MG tablet Take 1 tablet (25 mg total) by mouth every 8 (eight) hours as needed for nausea or vomiting., Starting Tue 01/08/2017, Print         Benjiman CorePickering, Rehman Levinson, MD 01/08/17 1627

## 2017-01-08 NOTE — ED Triage Notes (Signed)
Patient c/o mid abdominal pain and nausea, sharp in nature and states she was seen a week ago for the same and was told she had a virus. Patient states it is not any better. Patient states she has had intermittent elevated blood sugars.

## 2017-05-03 ENCOUNTER — Encounter (HOSPITAL_COMMUNITY): Payer: Self-pay | Admitting: Emergency Medicine

## 2017-05-03 ENCOUNTER — Emergency Department (HOSPITAL_COMMUNITY)
Admission: EM | Admit: 2017-05-03 | Discharge: 2017-05-03 | Disposition: A | Discharge status: Home / Self Care | Payer: BC Managed Care – PPO | Attending: Emergency Medicine | Admitting: Emergency Medicine

## 2017-05-03 DIAGNOSIS — Z5321 Procedure and treatment not carried out due to patient leaving prior to being seen by health care provider: Secondary | ICD-10-CM | POA: Insufficient documentation

## 2017-05-03 DIAGNOSIS — R1110 Vomiting, unspecified: Secondary | ICD-10-CM | POA: Diagnosis present

## 2017-05-03 LAB — CBG MONITORING, ED: Glucose-Capillary: 302 mg/dL — ABNORMAL HIGH (ref 65–99)

## 2017-05-03 LAB — COMPREHENSIVE METABOLIC PANEL
ALBUMIN: 4 g/dL (ref 3.5–5.0)
ALK PHOS: 75 U/L (ref 38–126)
ALT: 13 U/L — AB (ref 14–54)
AST: 14 U/L — AB (ref 15–41)
Anion gap: 9 (ref 5–15)
BUN: 12 mg/dL (ref 6–20)
CALCIUM: 8.8 mg/dL — AB (ref 8.9–10.3)
CO2: 22 mmol/L (ref 22–32)
CREATININE: 0.87 mg/dL (ref 0.44–1.00)
Chloride: 100 mmol/L — ABNORMAL LOW (ref 101–111)
GFR calc Af Amer: >60 mL/min (ref >60)
GFR calc non Af Amer: >60 mL/min (ref >60)
GLUCOSE: 319 mg/dL — AB (ref 65–99)
Potassium: 4.4 mmol/L (ref 3.5–5.1)
SODIUM: 131 mmol/L — AB (ref 135–145)
Total Bilirubin: 0.6 mg/dL (ref 0.3–1.2)
Total Protein: 7.4 g/dL (ref 6.5–8.1)

## 2017-05-03 LAB — CBC
HCT: 41.2 % (ref 36.0–46.0)
HEMOGLOBIN: 13.7 g/dL (ref 12.0–15.0)
MCH: 29.2 pg (ref 26.0–34.0)
MCHC: 33.3 g/dL (ref 30.0–36.0)
MCV: 87.8 fL (ref 78.0–100.0)
PLATELETS: 367 10*3/uL (ref 150–400)
RBC: 4.69 MIL/uL (ref 3.87–5.11)
RDW: 13 % (ref 11.5–15.5)
WBC: 8.7 10*3/uL (ref 4.0–10.5)

## 2017-05-03 LAB — LIPASE, BLOOD: Lipase: 14 U/L (ref 11–51)

## 2017-05-03 LAB — I-STAT BETA HCG BLOOD, ED (MC, WL, AP ONLY): I-stat hCG, quantitative: <5 m[IU]/mL (ref <5)

## 2017-05-03 MED ORDER — ONDANSETRON 4 MG PO TBDP
4.0000 mg | ORAL_TABLET | Freq: Once | ORAL | Status: AC | PRN
  Administered 2017-05-03: 4 mg via ORAL
  Filled 2017-05-03: qty 1

## 2017-05-03 NOTE — ED Notes (Signed)
Patient gave stickers to registration and stated she was no longer waiting.

## 2017-05-03 NOTE — ED Triage Notes (Signed)
Patient c/o high BS and vomiting for 2 days. Patient reports that her sugar has been reading high. Patient denies diarrhea the past 2 days.

## 2017-05-14 ENCOUNTER — Emergency Department (HOSPITAL_COMMUNITY)
Admission: EM | Admit: 2017-05-14 | Discharge: 2017-05-14 | Disposition: A | Discharge status: Home / Self Care | Payer: BC Managed Care – PPO | Attending: Emergency Medicine | Admitting: Emergency Medicine

## 2017-05-14 ENCOUNTER — Other Ambulatory Visit: Payer: Self-pay

## 2017-05-14 ENCOUNTER — Encounter (HOSPITAL_COMMUNITY): Payer: Self-pay

## 2017-05-14 ENCOUNTER — Emergency Department (HOSPITAL_COMMUNITY): Payer: BC Managed Care – PPO

## 2017-05-14 DIAGNOSIS — Z79899 Other long term (current) drug therapy: Secondary | ICD-10-CM | POA: Insufficient documentation

## 2017-05-14 DIAGNOSIS — R739 Hyperglycemia, unspecified: Secondary | ICD-10-CM

## 2017-05-14 DIAGNOSIS — Z794 Long term (current) use of insulin: Secondary | ICD-10-CM | POA: Insufficient documentation

## 2017-05-14 DIAGNOSIS — E1065 Type 1 diabetes mellitus with hyperglycemia: Secondary | ICD-10-CM | POA: Diagnosis not present

## 2017-05-14 DIAGNOSIS — R1084 Generalized abdominal pain: Secondary | ICD-10-CM | POA: Diagnosis not present

## 2017-05-14 LAB — CBC WITH DIFFERENTIAL/PLATELET
Basophils Absolute: 0 10*3/uL (ref 0.0–0.1)
Basophils Relative: 1 %
Eosinophils Absolute: 0.3 10*3/uL (ref 0.0–0.7)
Eosinophils Relative: 4 %
HCT: 40.2 % (ref 36.0–46.0)
Hemoglobin: 13.4 g/dL (ref 12.0–15.0)
Lymphocytes Relative: 30 %
Lymphs Abs: 2.5 10*3/uL (ref 0.7–4.0)
MCH: 29.3 pg (ref 26.0–34.0)
MCHC: 33.3 g/dL (ref 30.0–36.0)
MCV: 88 fL (ref 78.0–100.0)
Monocytes Absolute: 0.4 10*3/uL (ref 0.1–1.0)
Monocytes Relative: 5 %
Neutro Abs: 5 10*3/uL (ref 1.7–7.7)
Neutrophils Relative %: 60 %
Platelets: 385 10*3/uL (ref 150–400)
RBC: 4.57 MIL/uL (ref 3.87–5.11)
RDW: 13.2 % (ref 11.5–15.5)
WBC: 8.2 10*3/uL (ref 4.0–10.5)

## 2017-05-14 LAB — COMPREHENSIVE METABOLIC PANEL
ALT: 11 U/L — ABNORMAL LOW (ref 14–54)
AST: 12 U/L — ABNORMAL LOW (ref 15–41)
Albumin: 3.4 g/dL — ABNORMAL LOW (ref 3.5–5.0)
Alkaline Phosphatase: 56 U/L (ref 38–126)
Anion gap: 6 (ref 5–15)
BUN: 13 mg/dL (ref 6–20)
CO2: 23 mmol/L (ref 22–32)
Calcium: 8 mg/dL — ABNORMAL LOW (ref 8.9–10.3)
Chloride: 107 mmol/L (ref 101–111)
Creatinine, Ser: 0.75 mg/dL (ref 0.44–1.00)
GFR calc Af Amer: >60 mL/min (ref >60)
GFR calc non Af Amer: >60 mL/min (ref >60)
Glucose, Bld: 319 mg/dL — ABNORMAL HIGH (ref 65–99)
Potassium: 3.6 mmol/L (ref 3.5–5.1)
Sodium: 136 mmol/L (ref 135–145)
Total Bilirubin: 0.3 mg/dL (ref 0.3–1.2)
Total Protein: 6.3 g/dL — ABNORMAL LOW (ref 6.5–8.1)

## 2017-05-14 LAB — URINALYSIS, ROUTINE W REFLEX MICROSCOPIC
Bacteria, UA: NONE SEEN
Bilirubin Urine: NEGATIVE
Glucose, UA: >=500 mg/dL — AB
Ketones, ur: 5 mg/dL — AB
Leukocytes, UA: NEGATIVE
Nitrite: NEGATIVE
Protein, ur: NEGATIVE mg/dL
Specific Gravity, Urine: 1.023 (ref 1.005–1.030)
Squamous Epithelial / LPF: NONE SEEN
pH: 6 (ref 5.0–8.0)

## 2017-05-14 LAB — CBG MONITORING, ED
GLUCOSE-CAPILLARY: 367 mg/dL — AB (ref 65–99)
Glucose-Capillary: 200 mg/dL — ABNORMAL HIGH (ref 65–99)

## 2017-05-14 LAB — POC URINE PREG, ED: Preg Test, Ur: NEGATIVE

## 2017-05-14 LAB — LIPASE, BLOOD: Lipase: 22 U/L (ref 11–51)

## 2017-05-14 MED ORDER — PROMETHAZINE HCL 25 MG PO TABS: 25.0000 mg | ORAL_TABLET | Freq: Four times a day (QID) | ORAL | 0 refills | Status: DC | PRN

## 2017-05-14 MED ORDER — ONDANSETRON HCL 4 MG/2ML IJ SOLN
4.0000 mg | Freq: Once | INTRAMUSCULAR | Status: AC
  Administered 2017-05-14: 4 mg via INTRAVENOUS
  Filled 2017-05-14: qty 2

## 2017-05-14 MED ORDER — PROMETHAZINE HCL 25 MG/ML IJ SOLN
12.5000 mg | Freq: Once | INTRAMUSCULAR | Status: AC
  Administered 2017-05-14: 12.5 mg via INTRAVENOUS
  Filled 2017-05-14: qty 1

## 2017-05-14 MED ORDER — SODIUM CHLORIDE 0.9 % IV BOLUS (SEPSIS)
1000.0000 mL | Freq: Once | INTRAVENOUS | Status: AC
  Administered 2017-05-14: 1000 mL via INTRAVENOUS

## 2017-05-14 MED ORDER — INSULIN ASPART 100 UNIT/ML ~~LOC~~ SOLN
8.0000 [IU] | Freq: Once | SUBCUTANEOUS | Status: AC
  Administered 2017-05-14: 8 [IU] via SUBCUTANEOUS
  Filled 2017-05-14: qty 1

## 2017-05-14 MED ORDER — IOPAMIDOL (ISOVUE-300) INJECTION 61%
INTRAVENOUS | Status: AC
  Filled 2017-05-14: qty 100

## 2017-05-14 MED ORDER — IOPAMIDOL (ISOVUE-300) INJECTION 61%
100.0000 mL | Freq: Once | INTRAVENOUS | Status: AC | PRN
  Administered 2017-05-14: 100 mL via INTRAVENOUS

## 2017-05-14 NOTE — Discharge Instructions (Signed)
Please read attached information. If you experience any new or worsening signs or symptoms please return to the emergency room for evaluation. Please follow-up with your primary care provider or specialist as discussed. Please use medication prescribed only as directed and discontinue taking if you have any concerning signs or symptoms.   °

## 2017-05-14 NOTE — ED Provider Notes (Signed)
[facility]-EMERGENCY DEPT Provider Note   CSN: [id number] Arrival date & time: 05/14/17  1200     History   Chief Complaint Chief Complaint  Patient presents with  . Hyperglycemia  . Nasal Congestion    HPI [name] is a 41 y.o. female.  HPI    41 year old female with a history of type 1 diabetes presents today with complaints of elevated blood sugar, nausea and abdominal pain.  Patient notes that for several weeks she has had on and off diarrhea, mild diffuse abdominal discomfort, nausea and several episodes of vomiting.  Patient notes that since the onset of the symptoms she is also had difficulty managing her blood sugar.  She notes today her blood sugar has been in the 400s despite use of insulin at home.  She notes she took Zofran this morning, no vomiting today.  She denies any fever, reports that she was diagnosed with sinus infection yesterday and also noted to be in DKA.     Past Medical History:  Diagnosis Date  . Cluster headache syndrome, intractable   . Depression   . Migraine without aura, with intractable migraine, so stated, without mention of status migrainosus 12/05/2012  . Ovarian cyst   . Type I diabetes mellitus [facility])     Patient Active Problem List   Diagnosis Date Noted  . DKA, type 1 (HCC) 02/14/2015  . DKA (diabetic ketoacidoses) (HCC) 02/14/2015  . Thyroid nodule 02/14/2015  . Depression with anxiety 02/14/2015  . Acute kidney injury (HCC) 02/14/2015  . Leukocytosis 02/14/2015  . Increased anion gap metabolic acidosis 02/14/2015  . Recurrent major depression-severe (HCC) 01/06/2013  . Generalized anxiety disorder 01/06/2013  . Migraine without aura, with intractable migraine, so stated, without mention of status migrainosus 12/05/2012  . IDDM (insulin dependent diabetes mellitus) (HCC) 12/05/2012    Past Surgical History:  Procedure Laterality Date  . APPENDECTOMY    . BUNIONECTOMY Bilateral   .  CHOLECYSTECTOMY    . OVARIAN CYST REMOVAL      OB History    No data available       Home Medications    Prior to Admission medications   Medication Sig Start Date End Date Taking? Authorizing Provider  ARIPiprazole (ABILIFY) 20 MG tablet Take 20 mg by mouth daily.   Yes [provider]  clonazePAM (KLONOPIN) 0.5 MG tablet Take 0.5 mg by mouth 3 (three) times daily as needed for anxiety. For anxiety 01/09/13  Yes Thermon Leyland, NP  doxycycline (VIBRAMYCIN) 100 MG capsule Take 100 mg 2 (two) times daily by mouth. 05/13/17  Yes [provider]  DULoxetine (CYMBALTA) 30 MG capsule Take 30 mg by mouth daily. Takes with 60 mg to make a total of 90 mg a day   Yes [provider]  DULoxetine (CYMBALTA) 60 MG capsule Take 60 mg by mouth daily. Takes with 30 mg to make a total of 90 mg a day   Yes [provider]  guaiFENesin (MUCINEX) 600 MG 12 hr tablet Take 600 mg 2 (two) times daily by mouth.   Yes [provider]  Insulin Glargine (BASAGLAR KWIKPEN) 100 UNIT/ML SOPN Inject 34 Units at bedtime into the skin.   Yes [provider]  insulin lispro (HUMALOG) 100 UNIT/ML injection Inject 1-10 Units 3 (three) times daily before meals into the skin.   Yes [provider]  ipratropium (ATROVENT) 0.06 % nasal spray Place 2 sprays 3 (three) times daily into the  nose. 05/13/17 05/27/17 Yes [provider]  levonorgestrel (MIRENA) 20 MCG/24HR IUD 1 each by Intrauterine route once. Placed 06/2014   Yes [provider]  Multiple Vitamin (MULTIVITAMIN WITH MINERALS) TABS Take 1 tablet by mouth daily. 01/09/13  Yes Thermon Leylandavis, Laura A, NP  ondansetron (ZOFRAN ODT) 8 MG disintegrating tablet 8mg  ODT q4 hours prn nausea 01/04/17  Yes Delo, Riley Lamouglas, MD  ondansetron (ZOFRAN) 4 MG tablet Take 4 mg 3 (three) times daily as needed by mouth for nausea/vomiting. 05/03/17  Yes [provider]  HYDROcodone-acetaminophen (NORCO) 5-325 MG  tablet Take 1-2 tablets by mouth every 6 (six) hours as needed. Patient not taking: Reported on 01/08/2017 01/04/17   Geoffery Lyonselo, Douglas, MD  potassium chloride 20 MEQ TBCR Take 10 mEq by mouth daily. Patient not taking: Reported on 01/08/2017 02/17/15   Rhetta MuraSamtani, Jai-Gurmukh, MD  promethazine (PHENERGAN) 25 MG tablet Take 1 tablet (25 mg total) every 6 (six) hours as needed by mouth for nausea or vomiting. 05/14/17   Karon Heckendorn, Tinnie GensJeffrey, PA-C  zolpidem (AMBIEN) 10 MG tablet Take 10 mg by mouth at bedtime.    [provider]    Family History Family History  Adopted: Yes  Problem Relation Age of Onset  . Diabetes Father     Social History Social History   Tobacco Use  . Smoking status: Never Smoker  . Smokeless tobacco: Never Used  Substance Use Topics  . Alcohol use: Yes    Comment: rare  . Drug use: No     Allergies   Gluten meal; Sulfa antibiotics; Wellbutrin [bupropion]; Amoxicillin; and Codeine   Review of Systems Review of Systems  All other systems reviewed and are negative.    Physical Exam Updated Vital Signs BP (!) 125/58 (BP Location: Right Arm)   Pulse 82   Temp 98.1 F (36.7 C) (Oral)   Resp 18   SpO2 100%   Physical Exam  Constitutional: She is oriented to person, place, and time. She appears well-developed and well-nourished.  HENT:  Head: Normocephalic and atraumatic.  Eyes: Conjunctivae are normal. Pupils are equal, round, and reactive to light. Right eye exhibits no discharge. Left eye exhibits no discharge. No scleral icterus.  Neck: Normal range of motion. No JVD present. No tracheal deviation present.  Pulmonary/Chest: Effort normal. No stridor.  Abdominal: Soft. Bowel sounds are normal. She exhibits no distension and no mass. There is tenderness. There is no rebound and no guarding. No hernia.  Diffuse abdominal tenderness to palpation nonfocal  Musculoskeletal: She exhibits no deformity.  Neurological: She is alert and oriented to person,  place, and time. Coordination normal.  Psychiatric: She has a normal mood and affect. Her behavior is normal. Judgment and thought content normal.  Nursing note and vitals reviewed.    ED Treatments / Results  Labs (all labs ordered are listed, but only abnormal results are displayed) Labs Reviewed  COMPREHENSIVE METABOLIC PANEL - Abnormal; Notable for the following components:      Result Value   Glucose, Bld 319 (*)    Calcium 8.0 (*)    Total Protein 6.3 (*)    Albumin 3.4 (*)    AST 12 (*)    ALT 11 (*)    All other components within normal limits  URINALYSIS, ROUTINE W REFLEX MICROSCOPIC - Abnormal; Notable for the following components:   Color, Urine STRAW (*)    Glucose, UA >=500 (*)    Hgb urine dipstick SMALL (*)    Ketones, ur 5 (*)  All other components within normal limits  CBG MONITORING, ED - Abnormal; Notable for the following components:   Glucose-Capillary 367 (*)    All other components within normal limits  CBG MONITORING, ED - Abnormal; Notable for the following components:   Glucose-Capillary 200 (*)    All other components within normal limits  CBC WITH DIFFERENTIAL/PLATELET  LIPASE, BLOOD  POC URINE PREG, ED    EKG  EKG Interpretation None       Radiology Ct Abdomen Pelvis W Contrast  Result Date: 05/14/2017 CLINICAL DATA:  41 year old female with generalized abdominal pain and nausea. EXAM: CT ABDOMEN AND PELVIS WITH CONTRAST TECHNIQUE: Multidetector CT imaging of the abdomen and pelvis was performed using the standard protocol following bolus administration of intravenous contrast. CONTRAST:  ISOVUE-300 IOPAMIDOL (ISOVUE-300) INJECTION 61% COMPARISON:  CT Abdomen and Pelvis without contrast 01/04/2017 and earlier. FINDINGS: Lower chest: Mildly lower lung volumes compared to July. Mild elevation of the right hemidiaphragm and minimal costophrenic angle atelectasis. No pericardial or pleural effusion. Hepatobiliary: Negative liver. The  gallbladder appears to be surgically absent. No biliary ductal enlargement. Pancreas: Negative. Spleen: Negative. Adrenals/Urinary Tract: Normal adrenal glands. There are multiple small areas of chronic left renal cortical scarring. Otherwise bilateral renal enhancement is within normal limits. No perinephric stranding. No nephrolithiasis or hydronephrosis. The ureters appear symmetric and within normal limits. Unremarkable urinary bladder.  Stable bilateral pelvic phleboliths. Stomach/Bowel: Negative rectum. Redundant but otherwise negative sigmoid colon. Retained stool from the descending colon to the cecum. Evidence of prior appendectomy is stable. No large bowel inflammation. Negative terminal ileum. No dilated small bowel. Negative stomach and duodenum. No abdominal free fluid. Vascular/Lymphatic: Major arterial structures in the abdomen and pelvis appear patent. Mild calcified proximal femoral artery atherosclerosis. Portal venous system is patent. Small but increased number of abdominal lymph nodes have not significantly changed since a 2014 CT Abdomen and Pelvis. Reproductive: Stable and negative with uterine IUD. Other: No pelvic free fluid. 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 Chronic or congenital left L5 pars fracture. No acute osseous abnormality identified. IMPRESSION: No acute or inflammatory process identified. Stable CT appearance of the the abdomen and pelvis. Electronically Signed   By: Odessa Fleming M.D.   On: 05/14/2017 16:56    Procedures Procedures (including critical care time)  Medications Ordered in ED Medications  iopamidol (ISOVUE-300) 61 % injection (not administered)  sodium chloride 0.9 % bolus 1,000 mL (1,000 mLs Intravenous New Bag/Given 05/14/17 1547)  insulin aspart (novoLOG) injection 8 Units (8 Units Subcutaneous Given 05/14/17 1630)  iopamidol (ISOVUE-300) 61 % injection 100 mL (100 mLs Intravenous Contrast Given 05/14/17 1638)    promethazine (PHENERGAN) injection 12.5 mg (12.5 mg Intravenous Given 05/14/17 1709)  ondansetron (ZOFRAN) injection 4 mg (4 mg Intravenous Given 05/14/17 1630)     Initial Impression / Assessment and Plan / ED Course  I have reviewed the triage vital signs and the nursing notes.  Pertinent labs & imaging results that were available during my care of the patient were reviewed by me and considered in my medical decision making (see chart for details).      Final Clinical Impressions(s) / ED Diagnoses   Final diagnoses:  Hyperglycemia  Generalized abdominal pain    Labs: CBC, CMP, lipase, urinalysis, urine pregnancy, carrier  Imaging:  Consults:  Therapeutics: NS, insulin   Discharge Meds:   Assessment/Plan: 41 year old female presents today with hyperglycemia.  She has no signs of DKA here.  Patient complaining of  generalized abdominal discomfort, CT scan shows no acute findings.  This is likely secondary to persistently elevated blood sugar.  Patient improved with normal saline and antinausea medication here.  Her blood sugar is down to 200.  Patient does have insulin at home.  She is encouraged to continue using as directed.  Patient does not have any signs of acute infectious etiology here.  Patient given strict return precautions, she verbalized understanding and agreement to today's plan had no further questions or concerns the time discharge.     ED Discharge Orders        Ordered    promethazine (PHENERGAN) 25 MG tablet  Every 6 hours PRN     05/14/17 1836       Eyvonne MechanicHedges, Anokhi Shannon, PA-C 05/14/17 1839    Charlynne PanderYao, David Hsienta, MD 05/15/17 [phone]

## 2017-05-14 NOTE — ED Triage Notes (Signed)
Patient BIB EMS from work with complaints of weakness and sinus congestion. Patient was seen at her PCP yesterday for the same and was diagnosed with sinus infection and DKA. Patient was told to treat herself at home with sliding scale insulin. Patient states she took 10u humalog before EMS arrived. CBG for EMS= 474.  BP 124/74, HR 90, RR18, O2 97% RA

## 2017-11-09 ENCOUNTER — Encounter (HOSPITAL_COMMUNITY): Payer: Self-pay

## 2017-11-09 ENCOUNTER — Emergency Department (HOSPITAL_COMMUNITY)
Admission: EM | Admit: 2017-11-09 | Discharge: 2017-11-09 | Disposition: A | Discharge status: Home / Self Care | Payer: BC Managed Care – PPO | Attending: Emergency Medicine | Admitting: Emergency Medicine

## 2017-11-09 ENCOUNTER — Other Ambulatory Visit: Payer: Self-pay

## 2017-11-09 DIAGNOSIS — E109 Type 1 diabetes mellitus without complications: Secondary | ICD-10-CM | POA: Insufficient documentation

## 2017-11-09 DIAGNOSIS — Z79899 Other long term (current) drug therapy: Secondary | ICD-10-CM | POA: Diagnosis not present

## 2017-11-09 DIAGNOSIS — G43909 Migraine, unspecified, not intractable, without status migrainosus: Secondary | ICD-10-CM

## 2017-11-09 DIAGNOSIS — Z794 Long term (current) use of insulin: Secondary | ICD-10-CM | POA: Insufficient documentation

## 2017-11-09 DIAGNOSIS — R51 Headache: Secondary | ICD-10-CM | POA: Diagnosis present

## 2017-11-09 LAB — CBG MONITORING, ED: Glucose-Capillary: 192 mg/dL — ABNORMAL HIGH (ref 65–99)

## 2017-11-09 MED ORDER — KETOROLAC TROMETHAMINE 30 MG/ML IJ SOLN
30.0000 mg | Freq: Once | INTRAMUSCULAR | Status: AC
  Administered 2017-11-09: 30 mg via INTRAVENOUS
  Filled 2017-11-09: qty 1

## 2017-11-09 MED ORDER — BUTALBITAL-APAP-CAFFEINE 50-325-40 MG PO TABS: 1.0000 | ORAL_TABLET | Freq: Four times a day (QID) | ORAL | 0 refills | Status: DC | PRN

## 2017-11-09 MED ORDER — MAGNESIUM SULFATE IN D5W 1-5 GM/100ML-% IV SOLN
1.0000 g | Freq: Once | INTRAVENOUS | Status: AC
  Administered 2017-11-09: 1 g via INTRAVENOUS
  Filled 2017-11-09: qty 100

## 2017-11-09 MED ORDER — BUTALBITAL-APAP-CAFFEINE 50-325-40 MG PO TABS
1.0000 | ORAL_TABLET | Freq: Once | ORAL | Status: AC
  Administered 2017-11-09: 1 via ORAL
  Filled 2017-11-09: qty 1

## 2017-11-09 MED ORDER — METOCLOPRAMIDE HCL 5 MG/ML IJ SOLN
10.0000 mg | Freq: Once | INTRAMUSCULAR | Status: AC
  Administered 2017-11-09: 10 mg via INTRAVENOUS
  Filled 2017-11-09: qty 2

## 2017-11-09 MED ORDER — DIPHENHYDRAMINE HCL 50 MG/ML IJ SOLN
25.0000 mg | Freq: Once | INTRAMUSCULAR | Status: AC
  Administered 2017-11-09: 25 mg via INTRAVENOUS
  Filled 2017-11-09: qty 1

## 2017-11-09 MED ORDER — SODIUM CHLORIDE 0.9 % IV BOLUS
1000.0000 mL | Freq: Once | INTRAVENOUS | Status: AC
  Administered 2017-11-09: 1000 mL via INTRAVENOUS

## 2017-11-09 NOTE — ED Notes (Signed)
Per P.A. Hina, I have just applied O2 at 100% FIO2; plan to leave on 15 min. For treatment of cluster h/a.

## 2017-11-09 NOTE — Discharge Instructions (Addendum)
Drink plenty of fluids at home. This will help with your headache.  °Please follow up with pcp or neurologist.  ° °Fortunately, your evaluation today is reassuring with no apparent emergent cause for your headache at this time. With that being said, it is VERY important that you monitor your symptoms at home. If you develop worsening headache, new fever, new neck stiffness, rash, weakness, numbness, trouble with your speech, trouble walking, new or worsening symptoms or any concerning symptoms, please return to the ED immediately.  ° °

## 2017-11-09 NOTE — ED Provider Notes (Addendum)
Care assumed from previous provider PA [place]. Please see their note for further details to include full history and physical. To summarize in short pt is a 42 year old female past medical history significant for diabetes, cluster headaches and migraines presents to the ED for migraine that started yesterday.  According to prior provider patient has no red flag symptoms.  States this is typical of her migraine headache.. Case discussed, plan agreed upon.   She was given Reglan, Toradol, Benadryl, fluids and Fioricet.  Patient's pain only minimally improved.  Prior provider additionally ordered magnesium.  Patient has slight improvement in her headache.  Reports a 6 out of 10.  She came in with a 10/10.  Discussed with patient that we can do further work-up including imaging and medications or what has worked for her in the past.  She states that she would just like to go home and try to go to bed.  She will return if her symptoms worsen.  She states that she would like a prescription for Fioricet which has worked in the past.  Get ambulatory referral to neurology.  According to prior provider patient had no neurological deficits and was not concerned for any intracranial bleed, mass or any other acute abnormality's.  Pt is hemodynamically stable, in NAD, & able to ambulate in the ED. Evaluation does not show pathology that would require ongoing emergent intervention or inpatient treatment. I explained the diagnosis to the patient. Pain has been managed & has no complaints prior to dc. Pt is comfortable with above plan and is stable for discharge at this time. All questions were answered prior to disposition. Strict return precautions for f/u to the ED were discussed. Encouraged follow up with PCP.         Rise Mu, PA-C 11/09/17 1840    Rise Mu, PA-C 11/09/17 1842    Benjiman Core, MD 11/10/17 [phone]

## 2017-11-09 NOTE — ED Triage Notes (Signed)
Migraine since yesterday. Hx of. Usually treats with fiorcet, but does not have any at home. Has not had migraine in 2 years. No neuro deficits noted in triage.

## 2017-11-09 NOTE — ED Provider Notes (Addendum)
[facility]-EMERGENCY DEPT Provider Note   CSN: [id number] Arrival date & time: 11/09/17  4098     History   Chief Complaint Chief Complaint  Patient presents with  . Migraine    HPI [name] is a 42 y.o. female with a past medical history of type 1 diabetes, cluster headaches, migraines, who presents to ED for evaluation of migraine since yesterday.  She reports headache with photophobia, phonophobia and nausea.  Reports history of similar symptoms in the past but she has not had a migraine in 2 years.  Symptoms usually resolve with Fioricet but she does not have this medication at home so she was unable to take anything.  Reports similar migraine 4 days ago which resolved spontaneously.  She denies any head injury, vision changes, neck pain, fever, numbness in arms or legs, family or personal history of aneurysms.  HPI  Past Medical History:  Diagnosis Date  . Cluster headache syndrome, intractable   . Depression   . Migraine without aura, with intractable migraine, so stated, without mention of status migrainosus 12/05/2012  . Ovarian cyst   . Type I diabetes mellitus [facility])     Patient Active Problem List   Diagnosis Date Noted  . DKA, type 1 (HCC) 02/14/2015  . DKA (diabetic ketoacidoses) (HCC) 02/14/2015  . Thyroid nodule 02/14/2015  . Depression with anxiety 02/14/2015  . Acute kidney injury (HCC) 02/14/2015  . Leukocytosis 02/14/2015  . Increased anion gap metabolic acidosis 02/14/2015  . Recurrent major depression-severe (HCC) 01/06/2013  . Generalized anxiety disorder 01/06/2013  . Migraine without aura, with intractable migraine, so stated, without mention of status migrainosus 12/05/2012  . IDDM (insulin dependent diabetes mellitus) (HCC) 12/05/2012    Past Surgical History:  Procedure Laterality Date  . APPENDECTOMY    . BUNIONECTOMY Bilateral   . CHOLECYSTECTOMY    . OVARIAN CYST REMOVAL       OB History   None       Home Medications    Prior to Admission medications   Medication Sig Start Date End Date Taking? Authorizing Provider  amphetamine-dextroamphetamine (ADDERALL) 20 MG tablet Take 20 mg by mouth 2 (two) times daily.  10/24/17  Yes [provider]  ARIPiprazole (ABILIFY) 20 MG tablet Take 20 mg by mouth daily.   Yes [provider]  clonazePAM (KLONOPIN) 0.5 MG tablet Take 0.5 mg by mouth 3 (three) times daily as needed for anxiety. For anxiety 01/09/13  Yes Thermon Leyland, NP  DULoxetine (CYMBALTA) 30 MG capsule Take 30 mg by mouth daily. Takes with 60 mg to make a total of 90 mg a day   Yes [provider]  DULoxetine (CYMBALTA) 60 MG capsule Take 60 mg by mouth daily. Takes with 30 mg to make a total of 90 mg a day   Yes [provider]  insulin aspart (NOVOLOG) 100 UNIT/ML injection Inject 10-20 Units into the skin 3 (three) times daily before meals. Sliding Scale   Yes [provider]  Insulin Glargine (BASAGLAR KWIKPEN) 100 UNIT/ML SOPN Inject 42 Units into the skin at bedtime.    Yes [provider]  levonorgestrel (MIRENA) 20 MCG/24HR IUD 1 each by Intrauterine route once. Placed 06/2014   Yes [provider]  Multiple Vitamin (MULTIVITAMIN WITH MINERALS) TABS Take 1 tablet by mouth daily. 01/09/13  Yes Thermon Leyland, NP  ondansetron (ZOFRAN) 4 MG tablet Take 4 mg 3 (three) times daily as needed by mouth for  nausea/vomiting. 05/03/17  Yes [provider]  traZODone (DESYREL) 50 MG tablet Take 50 mg by mouth at bedtime as needed for sleep.  10/08/17  Yes [provider]  zolpidem (AMBIEN) 10 MG tablet Take 10 mg by mouth at bedtime as needed for sleep.    Yes [provider]  HYDROcodone-acetaminophen (NORCO) 5-325 MG tablet Take 1-2 tablets by mouth every 6 (six) hours as needed. Patient not taking: Reported on 01/08/2017 01/04/17   Geoffery Lyons, MD  ipratropium (ATROVENT) 0.06 % nasal spray Place 2  sprays 3 (three) times daily into the nose. 05/13/17 05/27/17  [provider]  ondansetron (ZOFRAN ODT) 8 MG disintegrating tablet  ODT q4 hours prn nausea Patient not taking: Reported on 11/09/2017 01/04/17   Geoffery Lyons, MD  potassium chloride 20 MEQ TBCR Take 10 mEq by mouth daily. Patient not taking: Reported on 01/08/2017 02/17/15   Rhetta Mura, MD  promethazine (PHENERGAN) 25 MG tablet Take 1 tablet (25 mg total) every 6 (six) hours as needed by mouth for nausea or vomiting. 05/14/17   Eyvonne Mechanic, PA-C    Family History Family History  Adopted: Yes  Problem Relation Age of Onset  . Diabetes Father     Social History Social History   Tobacco Use  . Smoking status: Never Smoker  . Smokeless tobacco: Never Used  Substance Use Topics  . Alcohol use: Yes    Comment: rare  . Drug use: No     Allergies   Gluten meal; Sulfa antibiotics; Wellbutrin [bupropion]; Amoxicillin; and Codeine   Review of Systems Review of Systems  Constitutional: Negative for appetite change, chills and fever.  HENT: Negative for ear pain, rhinorrhea, sneezing and sore throat.   Eyes: Positive for photophobia. Negative for visual disturbance.  Respiratory: Negative for cough, chest tightness, shortness of breath and wheezing.   Cardiovascular: Negative for chest pain and palpitations.  Gastrointestinal: Positive for nausea. Negative for abdominal pain, blood in stool, constipation, diarrhea and vomiting.  Genitourinary: Negative for dysuria, hematuria and urgency.  Musculoskeletal: Negative for myalgias.  Skin: Negative for rash.  Neurological: Positive for headaches. Negative for dizziness, weakness and light-headedness.     Physical Exam Updated Vital Signs BP 115/72 (BP Location: Right Arm)   Pulse 80   Temp 97.8 F (36.6 C) (Oral)   Resp 16   Ht  (1.549 m)   Wt 72.6 kg (160 lb)   SpO2 100%   BMI 30.23 kg/m   Physical Exam  Constitutional: She is  oriented to person, place, and time. She appears well-developed and well-nourished. No distress.  HENT:  Head: Normocephalic and atraumatic.  Nose: Nose normal.  Eyes: Pupils are equal, round, and reactive to light. Conjunctivae and EOM are normal. Right eye exhibits no discharge. Left eye exhibits no discharge. No scleral icterus.  Neck: Normal range of motion. Neck supple.  Cardiovascular: Normal rate, regular rhythm, normal heart sounds and intact distal pulses. Exam reveals no gallop and no friction rub.  No murmur heard. Pulmonary/Chest: Effort normal and breath sounds normal. No respiratory distress.  Abdominal: Soft. Bowel sounds are normal. She exhibits no distension. There is no tenderness. There is no guarding.  Musculoskeletal: Normal range of motion. She exhibits no edema.  Neurological: She is alert and oriented to person, place, and time. No cranial nerve deficit or sensory deficit. She exhibits normal muscle tone. Coordination normal.  Pupils reactive. No facial asymmetry noted. Cranial nerves appear grossly intact. Sensation intact to light  touch on face, BUE and BLE. Strength 5/5 in BUE and BLE.   Skin: Skin is warm and dry. No rash noted.  Psychiatric: She has a normal mood and affect.  Nursing note and vitals reviewed.    ED Treatments / Results  Labs (all labs ordered are listed, but only abnormal results are displayed) Labs Reviewed  CBG MONITORING, ED - Abnormal; Notable for the following components:      Result Value   Glucose-Capillary 192 (*)    All other components within normal limits    EKG None  Radiology No results found.  Procedures Procedures (including critical care time)  Medications Ordered in ED Medications  magnesium sulfate IVPB 1 g 100 mL (has no administration in time range)  butalbital-acetaminophen-caffeine (FIORICET, ESGIC) 50-325-40 MG per tablet 1 tablet (1 tablet Oral Given 11/09/17 1321)  metoCLOPramide (REGLAN) injection 10 mg  (10 mg Intravenous Given 11/09/17 1320)  sodium chloride 0.9 % bolus 1,000 mL (1,000 mLs Intravenous New Bag/Given 11/09/17 1320)  ketorolac (TORADOL) 30 MG/ML injection 30 mg (30 mg Intravenous Given 11/09/17 1320)  diphenhydrAMINE (BENADRYL) injection 25 mg (25 mg Intravenous Given 11/09/17 1456)     Initial Impression / Assessment and Plan / ED Course  I have reviewed the triage vital signs and the nursing notes.  Pertinent labs & imaging results that were available during my care of the patient were reviewed by me and considered in my medical decision making (see chart for details).  Clinical Course as of Nov 10 1543  Sat Nov 09, 2017  1446 Patient reports some improvement in her migraine with medications including Reglan, Fioricet and Toradol given. Will add on benadryl, reassess.   [HK]  1538 Reports a little more improvement with medications given. States still "intense." Will reassess and will add on IV magnesium if needed.   [HK]    Clinical Course User Index [HK] Dietrich Pates, PA-C    Patient presents to ED for evaluation of migraine since yesterday.  She reports associated photophobia, phonophobia and nausea.  She does have a history of migraines but has not had one in 2 years.  Because of that she does not have any of her home Fioricet which usually helps her with her migraine.  She cannot recall any inciting event that may have triggered the migraine yesterday.  She denies any head injuries, numbness in arms or legs, fever, neck pain, vision changes.  There are no deficits on neurological examination.  Patient does appear uncomfortable.  Vital signs within normal limits.  Symptoms most likely due to her migraines, which she has a history of.   There are no headache characteristics that are lateralizing or concerning for increased ICP, infectious or vascular cause of her symptoms.   Patient was given Fioricet, Toradol, Reglan and Benadryl with mild improvement in her symptoms.  States  that the pain is still "intense."  Will add IV magnesium and reassess.  CBG checked today was 192. Care handed off to oncoming provider, K. Leaphart, PA-C pending reevaluation after IV magnesium, oxygen given. In the past, patient has been treated with IV narcotics for headache (morphine, Dilaudid). This is not ideal, but may consider if symptoms do not improve.  Portions of this note were generated with Scientist, clinical (histocompatibility and immunogenetics). Dictation errors may occur despite best attempts at proofreading.   Final Clinical Impressions(s) / ED Diagnoses   Final diagnoses:  None    ED Discharge Orders    None  Dietrich Pates, PA-C 11/09/17 1549    Benjiman Core, MD 11/09/17 1558    [address] PA-C 11/09/17 1637    Benjiman Core, MD 11/10/17 [phone]

## 2017-11-09 NOTE — ED Notes (Signed)
O2 removed per plan.

## 2017-11-18 ENCOUNTER — Telehealth: Payer: Self-pay | Admitting: Neurology

## 2017-11-18 ENCOUNTER — Ambulatory Visit: Payer: BC Managed Care – PPO | Admitting: Neurology

## 2017-11-18 ENCOUNTER — Encounter: Payer: Self-pay | Admitting: Neurology

## 2017-11-18 VITALS — BP 136/96 | HR 116 | Ht 61.0 in | Wt 156.5 lb

## 2017-11-18 DIAGNOSIS — G43019 Migraine without aura, intractable, without status migrainosus: Secondary | ICD-10-CM | POA: Diagnosis not present

## 2017-11-18 MED ORDER — ERENUMAB-AOOE 140 MG/ML ~~LOC~~ SOAJ: 140.0000 mg | SUBCUTANEOUS | 4 refills | Status: DC

## 2017-11-18 MED ORDER — BUTALBITAL-APAP-CAFFEINE 50-325-40 MG PO TABS: 1.0000 | ORAL_TABLET | Freq: Four times a day (QID) | ORAL | 1 refills | Status: DC | PRN

## 2017-11-18 MED ORDER — ONDANSETRON HCL 4 MG PO TABS: 4.0000 mg | ORAL_TABLET | Freq: Three times a day (TID) | ORAL | 3 refills | Status: DC | PRN

## 2017-11-18 MED ORDER — VILAZODONE HCL 20 MG PO TABS: 20.0000 mg | ORAL_TABLET | Freq: Every day | ORAL | Status: DC

## 2017-11-18 MED ORDER — BREXPIPRAZOLE 1 MG PO TABS: 1.0000 mg | ORAL_TABLET | Freq: Every day | ORAL | Status: DC

## 2017-11-18 NOTE — Telephone Encounter (Signed)
Pt. Needs 3 mo f/u w/ np per Dr. Anne Hahn.  She states she will call us to r/s

## 2017-11-18 NOTE — Progress Notes (Signed)
Reason for visit: Migraine headache  Referring physician: [name] is a 42 y.o. female  History of present illness:  [name] is a 42 year old right-handed white female with a history of intractable migraine headache.  The patient was seen previously through this office in 2014.  She had moved to [facility], and for about 2-1/2 years she had no headaches whatsoever.  She has been back in [place] for about a year, she has done quite well until about 2 and half weeks ago.  The patient indicates that she has been followed through psychiatry, they have been tapering her off of her Cymbalta and during the taper her headaches started again and have been daily for about 2 and half weeks.  The patient went to the emergency room for an injection, a course of prednisone in the past has not ever been helpful for her.  She has not gained benefit from Imitrex either.  Fioricet seems to help her headache.  The patient currently is not on any daily prophylactic drug.  Her headaches are on the top of the head, may be associated with phonophobia and photophobia and nausea and vomiting.  She denies any particular activating factors for her headache.  She denies any focal numbness or weakness of the face, arms, legs.  She has been under some stress with her job, she is also in school trying to get a masters degree.  She has been on a multitude of medications in the past including Imitrex, gabapentin, Topamax, Flexeril, Seroquel, [employer], Phenergan, Toradol, naproxen, lithium, propranolol, Effexor, amitriptyline, and Fioricet.  She has recently been on Cymbalta.  The patient returns for further evaluation.  Past Medical History:  Diagnosis Date  . Cluster headache syndrome, intractable   . Depression   . Migraine without aura, with intractable migraine, so stated, without mention of status migrainosus 12/05/2012  . Ovarian cyst   . Type I diabetes mellitus (HCC)     Past Surgical  History:  Procedure Laterality Date  . APPENDECTOMY    . BUNIONECTOMY Bilateral   . CHOLECYSTECTOMY    . OVARIAN CYST REMOVAL      Family History  Adopted: Yes  Problem Relation Age of Onset  . Diabetes Father     Social history:  reports that she has never smoked. She has never used smokeless tobacco. She reports that she drinks alcohol. She reports that she does not use drugs.  Medications:  Prior to Admission medications   Medication Sig Start Date End Date Taking? Authorizing Provider  amphetamine-dextroamphetamine (ADDERALL) 20 MG tablet Take 20 mg by mouth 3 (three) times daily.  10/24/17  Yes [provider]  butalbital-acetaminophen-caffeine (FIORICET, ESGIC) 50-325-40 MG tablet Take 1 tablet by mouth every 6 (six) hours as needed for headache. 11/18/17  Yes York Spaniel, MD  clonazePAM (KLONOPIN) 0.5 MG tablet Take 0.5 mg by mouth 3 (three) times daily as needed for anxiety. For anxiety 01/09/13  Yes Thermon Leyland, NP  insulin aspart (NOVOLOG) 100 UNIT/ML injection Inject 10-20 Units into the skin 3 (three) times daily before meals. Sliding Scale   Yes [provider]  Insulin Glargine (BASAGLAR KWIKPEN) 100 UNIT/ML SOPN Inject 42 Units into the skin at bedtime.    Yes [provider]  levonorgestrel (MIRENA) 20 MCG/24HR IUD 1 each by Intrauterine route once. Placed 06/2014   Yes [provider]  Multiple Vitamin (MULTIVITAMIN WITH MINERALS) TABS Take 1 tablet by mouth daily.  01/09/13  Yes Thermon Leyland, NP  ondansetron (ZOFRAN) 4 MG tablet Take 1 tablet (4 mg total) by mouth 3 (three) times daily as needed. 11/18/17  Yes York Spaniel, MD  promethazine (PHENERGAN) 25 MG tablet Take 1 tablet (25 mg total) every 6 (six) hours as needed by mouth for nausea or vomiting. 05/14/17  Yes Hedges, Tinnie Gens, PA-C  traZODone (DESYREL) 50 MG tablet Take 50 mg by mouth at bedtime as needed for sleep.  10/08/17  Yes [provider]  zolpidem  (AMBIEN) 10 MG tablet Take 10 mg by mouth at bedtime as needed for sleep.    Yes [provider]  Brexpiprazole (REXULTI) 1 MG TABS Take 1 tablet (1 mg total) by mouth daily. 11/18/17   York Spaniel, MD  Erenumab-aooe (AIMOVIG) 140 MG/ML SOAJ Inject 140 mg into the skin every 30 (thirty) days. 11/18/17   York Spaniel, MD  Vilazodone HCl (VIIBRYD) 20 MG TABS Take 1 tablet (20 mg total) by mouth daily. 11/18/17   York Spaniel, MD      Allergies  Allergen Reactions  . Gluten Meal Other (See Comments)    abdominal cramps  . Sulfa Antibiotics Nausea And Vomiting  . Wellbutrin [Bupropion] Other (See Comments)    "Makes me suicidal."  . Amoxicillin Rash    Has patient had a PCN reaction causing immediate rash, facial/tongue/throat swelling, SOB or lightheadedness with hypotension: Yes Has patient had a PCN reaction causing severe rash involving mucus membranes or skin necrosis: No Has patient had a PCN reaction that required hospitalization: No Has patient had a PCN reaction occurring within the last 10 years: No If all of the above answers are "NO", then may proceed with Cephalosporin use.   . Codeine Rash    Cannot take just codeine. Able to tolerate derivatives such as hydrocodone    ROS:  Out of a complete 14 system review of symptoms, the patient complains only of the following symptoms, and all other reviewed systems are negative.  Headache Nausea  Blood pressure (!) 136/96, pulse (!) 116, height  (1.549 m), weight 156 lb 8 oz (71 kg).  Physical Exam  General: The patient is alert and cooperative at the time of the examination.  Eyes: Pupil on the right is round and reactive, left is postsurgical. Discs are flat on the right, not visualized on the left.  Neck: The neck is supple, no carotid bruits are noted.  Respiratory: The respiratory examination is clear.  Cardiovascular: The cardiovascular examination reveals a regular rate and rhythm, no  obvious murmurs or rubs are noted.  Skin: Extremities are without significant edema.  Neurologic Exam  Mental status: The patient is alert and oriented x 3 at the time of the examination. The patient has apparent normal recent and remote memory, with an apparently normal attention span and concentration ability.  Cranial nerves: Facial symmetry is present. There is good sensation of the face to pinprick and soft touch bilaterally. The strength of the facial muscles and the muscles to head turning and shoulder shrug are normal bilaterally. Speech is well enunciated, no aphasia or dysarthria is noted. Extraocular movements are full. Visual fields are full with the right eye, the left eye is blind. The tongue is midline, and the patient has symmetric elevation of the soft palate. No obvious hearing deficits are noted.  Motor: The motor testing reveals 5 over 5 strength of all 4 extremities. Good symmetric motor tone is noted throughout.  Sensory: Sensory testing is intact to pinprick, soft touch, vibration sensation, and position sense on all 4 extremities. No evidence of extinction is noted.  Coordination: Cerebellar testing reveals good finger-nose-finger and heel-to-shin bilaterally.  Gait and station: Gait is normal. Tandem gait is normal. Romberg is negative. No drift is seen.  Reflexes: Deep tendon reflexes are symmetric and normal bilaterally. Toes are downgoing bilaterally.   Assessment/Plan:  1.  Intractable migraine headache  The patient has had worsening headaches since a taper off of Cymbalta.  It is possible this may be the etiology of her headaches.  The patient will be given a trial on Aimovig, a prescription for Fioricet was also given, I cautioned her not to use the medication frequently as this may result in rebound headaches.  The patient will follow-up in 3 months.  If she is not getting better on Aimovig, Botox therapy may be indicated.  Marlan Palau MD 11/18/2017 4:32  PM  [facility] [address] [place], [place] [address]  Phone [phone] Fax [phone]

## 2017-11-21 ENCOUNTER — Telehealth: Payer: Self-pay | Admitting: Neurology

## 2017-11-21 ENCOUNTER — Other Ambulatory Visit: Payer: Self-pay | Admitting: Neurology

## 2017-11-21 ENCOUNTER — Telehealth: Payer: Self-pay | Admitting: *Deleted

## 2017-11-21 MED ORDER — FREMANEZUMAB-VFRM 225 MG/1.5ML ~~LOC~~ SOSY: 1.0000 | PREFILLED_SYRINGE | SUBCUTANEOUS | 11 refills | Status: DC

## 2017-11-21 MED ORDER — CHLORPROMAZINE HCL 100 MG PO TABS
100.0000 mg | ORAL_TABLET | Freq: Two times a day (BID) | ORAL | 0 refills | Status: DC
Start: 2017-11-21 — End: 2017-12-13

## 2017-11-21 NOTE — Telephone Encounter (Signed)
I called the patient.  The patient has had a headache since yesterday, she wishes to come in to get an infusion of IV Depacon.  I will try to get this set up.

## 2017-11-21 NOTE — Telephone Encounter (Signed)
Attempted PA through cover my meds, [facility]. Pt insurances preference is for Ajovy or [employer]. Spoke with Dr Anne Hahn and he agreed to change the pt to either one of those. Order placed and sent for Ajovy.

## 2017-11-21 NOTE — Telephone Encounter (Signed)
This patient but identified herself as a patient of Dr. Anne Hahn called at midnight on 20 Nov 2017 to inform me that she has headaches for  over 24 hours and heard that we could treat her chief complaint by infusions in the office.  She wanted to hear what these infusions would consist of and how that can be arranged for her in the ED.  I explained that the effusion is most likely a reference to Depakote IV and that I do not think she can get that in the emergency room but she would have to call during office hours.   I also asked her to refrain from " informational "calls at midnight. CD

## 2017-11-21 NOTE — Telephone Encounter (Signed)
Attempted PA Ajovy on covermymeds. Received message back "Please advise the dispensing pharmacy to contact the Pharmacy Help Line at [phone] for assistance."  I called pharmacy and relayed message. They verbalized understanding. They will call. I advised I am in the office tomorrow from 8-12pm.

## 2017-11-21 NOTE — Telephone Encounter (Signed)
Pt called she's had a HA since 12:00 pm yesterday. She is wanting to know more about the infusion Dr W talked about at her OV. Please call to advise

## 2017-11-21 NOTE — Telephone Encounter (Signed)
The patient comes into the office today for a severe headache, she got Depacon injection, total of 1500 mg, she did not gaining benefit with this.  She claims that Toradol injections previously have not been beneficial.  She also states that the Aimovig was denied through her insurance.  The patient will be given a 3-day course of Thorazine for the headache.  A prescription will be called in.

## 2017-11-22 NOTE — Telephone Encounter (Signed)
Spoke w/ Dr. Vickey Huger. This has been addressed. Pt came for infusion yesterday at our office. Nothing further needed.

## 2017-11-29 NOTE — Telephone Encounter (Signed)
I called the patient.  The patient got Depacon last week which was very helpful, she has had recurrence of her headache today, she did well for almost a week.  She will come in for a Depacon injection, we may consider putting her on oral Depakote if this continues to work well for her.

## 2017-11-29 NOTE — Telephone Encounter (Signed)
Pt requesting to come in for an IV treatment today(stating she is off today and doesn't want to end up in the ED). Pts headache came around 4am this morning. Please call to advise. Pt aware the office closes at 12 today

## 2017-12-03 ENCOUNTER — Other Ambulatory Visit: Payer: Self-pay

## 2017-12-03 ENCOUNTER — Ambulatory Visit (INDEPENDENT_AMBULATORY_CARE_PROVIDER_SITE_OTHER): Payer: BC Managed Care – PPO | Admitting: Obstetrics and Gynecology

## 2017-12-03 ENCOUNTER — Encounter: Payer: Self-pay | Admitting: Obstetrics and Gynecology

## 2017-12-03 VITALS — BP 158/86 | HR 108 | Resp 16 | Ht 60.25 in | Wt 154.0 lb

## 2017-12-03 DIAGNOSIS — R030 Elevated blood-pressure reading, without diagnosis of hypertension: Secondary | ICD-10-CM

## 2017-12-03 DIAGNOSIS — Z124 Encounter for screening for malignant neoplasm of cervix: Secondary | ICD-10-CM

## 2017-12-03 DIAGNOSIS — Z01419 Encounter for gynecological examination (general) (routine) without abnormal findings: Secondary | ICD-10-CM

## 2017-12-03 DIAGNOSIS — Z3009 Encounter for other general counseling and advice on contraception: Secondary | ICD-10-CM | POA: Diagnosis not present

## 2017-12-03 DIAGNOSIS — Z8639 Personal history of other endocrine, nutritional and metabolic disease: Secondary | ICD-10-CM

## 2017-12-03 NOTE — Progress Notes (Signed)
42 y.o. G0P0000 SingleCaucasianF here for annual exam. Patient would like to discuss having a tubal ligation  She has a mirena IUD, place in 12/14. She is sure she doesn't want kids and is interested in sterilization.  Prior to the IUD her cycles were normal.  She is a type one diabetic. She states her diabetes has always been difficult to control.  Sexually active, same partner x 4 years. Don't live together. No dyspareunia.     No LMP recorded. (Menstrual status: IUD).          Sexually active: Yes.    The current method of family planning is IUD.    Exercising: No.  The patient does not participate in regular exercise at present. Smoker:  no  Health Maintenance: Pap:  2017 WNL per patient in [place] 06-30-13 WNL NEG HR HPV  History of abnormal Pap:  no MMG:  Never Colonoscopy:  Never BMD:   Never TDaP:  11-22-10 Gardasil: no, declines.     reports that she has never smoked. She has never used smokeless tobacco. She reports that she drinks alcohol. She reports that she does not use drugs. Teaches special ed.   Past Medical History:  Diagnosis Date  . Cluster headache syndrome, intractable   . Depression   . Migraine without aura, with intractable migraine, so stated, without mention of status migrainosus 12/05/2012  . Ovarian cyst   . STD (sexually transmitted disease)    HSV II   . Type I diabetes mellitus (HCC)   Depression/anxiety is currently well controlled.   Past Surgical History:  Procedure Laterality Date  . APPENDECTOMY    . BUNIONECTOMY Bilateral   . CHOLECYSTECTOMY    . OVARIAN CYST REMOVAL      Current Outpatient Medications  Medication Sig Dispense Refill  . amphetamine-dextroamphetamine (ADDERALL) 20 MG tablet Take 20 mg by mouth 3 (three) times daily.     . Brexpiprazole (REXULTI) 1 MG TABS Take 1 tablet (1 mg total) by mouth daily. 30 tablet   . butalbital-acetaminophen-caffeine (FIORICET, ESGIC) 50-325-40 MG tablet Take 1 tablet by mouth every 6 (six) hours  as needed for headache. 40 tablet 1  . chlorproMAZINE (THORAZINE) 100 MG tablet Take 1 tablet (100 mg total) by mouth 2 (two) times daily. 6 tablet 0  . clonazePAM (KLONOPIN) 0.5 MG tablet Take 0.5 mg by mouth 3 (three) times daily as needed for anxiety. For anxiety    . insulin aspart (NOVOLOG) 100 UNIT/ML injection Inject 10-20 Units into the skin 3 (three) times daily before meals. Sliding Scale    . Insulin Glargine (BASAGLAR KWIKPEN) 100 UNIT/ML SOPN Inject 42 Units into the skin at bedtime.     Marland Kitchen levonorgestrel (MIRENA) 20 MCG/24HR IUD 1 each by Intrauterine route once. Placed 06/2014    . ondansetron (ZOFRAN) 4 MG tablet Take 1 tablet (4 mg total) by mouth 3 (three) times daily as needed. 30 tablet 3  . promethazine (PHENERGAN) 25 MG tablet Take 1 tablet (25 mg total) every 6 (six) hours as needed by mouth for nausea or vomiting. 30 tablet 0  . traZODone (DESYREL) 50 MG tablet Take 50 mg by mouth at bedtime as needed for sleep.     . Vilazodone HCl (VIIBRYD) 20 MG TABS Take 1 tablet (20 mg total) by mouth daily. 7 tablet   . zolpidem (AMBIEN) 10 MG tablet Take 10 mg by mouth at bedtime as needed for sleep.     . Multiple Vitamin (MULTIVITAMIN WITH MINERALS)  TABS Take 1 tablet by mouth daily.     No current facility-administered medications for this visit.     Family History  Adopted: Yes  Problem Relation Age of Onset  . Diabetes Father     Review of Systems  Constitutional: Negative.   HENT: Negative.   Eyes: Negative.   Respiratory: Negative.   Cardiovascular: Negative.   Gastrointestinal: Negative.   Endocrine: Negative.   Genitourinary: Negative.   Musculoskeletal: Negative.   Skin: Negative.   Allergic/Immunologic: Negative.   Neurological: Positive for headaches.  Psychiatric/Behavioral: Negative.        Anxiety and depression     Exam:   BP (!) 158/86 (BP Location: Right Arm, Patient Position: Sitting, Cuff Size: Normal)   Pulse (!) 108   Resp 16   Ht 5' 0.25"  (1.53 m)   Wt 154 lb (69.9 kg)   BMI 29.83 kg/m   Weight change: @WEIGHTCHANGE @ Height:   Height: 5' 0.25" (153 cm)  Ht Readings from Last 3 Encounters:  12/03/17 5' 0.25" (1.53 m)  11/18/17 5\' 1"  (1.549 m)  11/09/17 5\' 1"  (1.549 m)    General appearance: alert, cooperative and appears stated age Head: Normocephalic, without obvious abnormality, atraumatic Neck: no adenopathy, supple, symmetrical, trachea midline and thyroid normal to inspection and palpation Lungs: clear to auscultation bilaterally Cardiovascular: regular rate and rhythm Breasts: normal appearance, no masses or tenderness Abdomen: soft, non-tender; non distended,  no masses,  no organomegaly Extremities: extremities normal, atraumatic, no cyanosis or edema Skin: Skin color, texture, turgor normal. No rashes or lesions Lymph nodes: Cervical, supraclavicular, and axillary nodes normal. No abnormal inguinal nodes palpated Neurologic: Grossly normal   Pelvic: External genitalia:  no lesions              Urethra:  normal appearing urethra with no masses, tenderness or lesions              Bartholins and Skenes: normal                 Vagina: normal appearing vagina with normal color and discharge, no lesions              Cervix: no lesions and IUD string 3-4 cm               Bimanual Exam:  Uterus:  normal size, contour, position, consistency, mobility, non-tender and anteverted              Adnexa: no mass, fullness, tenderness               Rectovaginal: declines   Chaperone was present for exam.  A:  Well Woman with normal exam  Type I DM  Elevated BP without diagnosis of HTN  Anxiety and Depression, states under control  Desires sterilization. Discussed laparoscopic removal of tubes  P:   Pap with hpv  Mammogram recommended  Discussed breast self exam  Discussed calcium and vit D intake  Will get [facility]   Discussed with the patient that her that it is not  recommended to do elective surgery without a Hgb1C under 7. She states that her HgbA1C has never been under 7, never will be and that she has had plenty of surgery. I will look at her records, discuss with her Endocrinologist and get back to her  She is establishing care with a [facility] primary [facility] this [facility], will f/u her BP there.

## 2017-12-03 NOTE — Patient Instructions (Signed)
EXERCISE AND DIET:  We recommended that you start or continue a regular exercise program for good health. Regular exercise means any activity that makes your heart beat faster and makes you sweat.  We recommend exercising at least 30 minutes per day at least 3 days a week, preferably 4 or 5.  We also recommend a diet low in fat and sugar.  Inactivity, poor dietary choices and obesity can cause diabetes, heart attack, stroke, and kidney damage, among others.    ALCOHOL AND SMOKING:  Women should limit their alcohol intake to no more than 7 drinks/beers/glasses of wine (combined, not each!) per week. Moderation of alcohol intake to this level decreases your risk of breast cancer and liver damage. And of course, no recreational drugs are part of a healthy lifestyle.  And absolutely no smoking or even second hand smoke. Most people know smoking can cause heart and lung diseases, but did you know it also contributes to weakening of your bones? Aging of your skin?  Yellowing of your teeth and nails?  CALCIUM AND VITAMIN D:  Adequate intake of calcium and Vitamin D are recommended.  The recommendations for exact amounts of these supplements seem to change often, but generally speaking 600 mg of calcium (either carbonate or citrate) and 800 units of Vitamin D per day seems prudent. Certain women may benefit from higher intake of Vitamin D.  If you are among these women, your doctor will have told you during your visit.    PAP SMEARS:  Pap smears, to check for cervical cancer or precancers,  have traditionally been done yearly, although recent scientific advances have shown that most women can have pap smears less often.  However, every woman still should have a physical exam from her gynecologist every year. It will include a breast check, inspection of the vulva and vagina to check for abnormal growths or skin changes, a visual exam of the cervix, and then an exam to evaluate the size and shape of the uterus and  ovaries.  And after 40 years of age, a rectal exam is indicated to check for rectal cancers. We will also provide age appropriate advice regarding health maintenance, like when you should have certain vaccines, screening for sexually transmitted diseases, bone density testing, colonoscopy, mammograms, etc.   MAMMOGRAMS:  All women over 40 years old should have a yearly mammogram. Many facilities now offer a "3D" mammogram, which may cost around $50 extra out of pocket. If possible,  we recommend you accept the option to have the 3D mammogram performed.  It both reduces the number of women who will be called back for extra views which then turn out to be normal, and it is better than the routine mammogram at detecting truly abnormal areas.    COLONOSCOPY:  Colonoscopy to screen for colon cancer is recommended for all women at age 50.  We know, you hate the idea of the prep.  We agree, BUT, having colon cancer and not knowing it is worse!!  Colon cancer so often starts as a polyp that can be seen and removed at colonscopy, which can quite literally save your life!  And if your first colonoscopy is normal and you have no family history of colon cancer, most women don't have to have it again for 10 years.  Once every ten years, you can do something that may end up saving your life, right?  We will be happy to help you get it scheduled when you are ready.    Be sure to check your insurance coverage so you understand how much it will cost.  It may be covered as a preventative service at no cost, but you should check your particular policy.      Breast Self-Awareness Breast self-awareness means being familiar with how your breasts look and feel. It involves checking your breasts regularly and reporting any changes to your health care provider. Practicing breast self-awareness is important. A change in your breasts can be a sign of a serious medical problem. Being familiar with how your breasts look and feel allows  you to find any problems early, when treatment is more likely to be successful. All women should practice breast self-awareness, including women who have had breast implants. How to do a breast self-exam One way to learn what is normal for your breasts and whether your breasts are changing is to do a breast self-exam. To do a breast self-exam: Look for Changes  1. Remove all the clothing above your waist. 2. Stand in front of a mirror in a room with good lighting. 3. Put your hands on your hips. 4. Push your hands firmly downward. 5. Compare your breasts in the mirror. Look for differences between them (asymmetry), such as: ? Differences in shape. ? Differences in size. ? Puckers, dips, and bumps in one breast and not the other. 6. Look at each breast for changes in your skin, such as: ? Redness. ? Scaly areas. 7. Look for changes in your nipples, such as: ? Discharge. ? Bleeding. ? Dimpling. ? Redness. ? A change in position. Feel for Changes  Carefully feel your breasts for lumps and changes. It is best to do this while lying on your back on the floor and again while sitting or standing in the shower or tub with soapy water on your skin. Feel each breast in the following way:  Place the arm on the side of the breast you are examining above your head.  Feel your breast with the other hand.  Start in the nipple area and make  inch (2 cm) overlapping circles to feel your breast. Use the pads of your three middle fingers to do this. Apply light pressure, then medium pressure, then firm pressure. The light pressure will allow you to feel the tissue closest to the skin. The medium pressure will allow you to feel the tissue that is a little deeper. The firm pressure will allow you to feel the tissue close to the ribs.  Continue the overlapping circles, moving downward over the breast until you feel your ribs below your breast.  Move one finger-width toward the center of the body.  Continue to use the  inch (2 cm) overlapping circles to feel your breast as you move slowly up toward your collarbone.  Continue the up and down exam using all three pressures until you reach your armpit.  Write Down What You Find  Write down what is normal for each breast and any changes that you find. Keep a written record with breast changes or normal findings for each breast. By writing this information down, you do not need to depend only on memory for size, tenderness, or location. Write down where you are in your menstrual cycle, if you are still menstruating. If you are having trouble noticing differences in your breasts, do not get discouraged. With time you will become more familiar with the variations in your breasts and more comfortable with the exam. How often should I examine my breasts? Examine   your breasts every month. If you are breastfeeding, the best time to examine your breasts is after a feeding or after using a breast pump. If you menstruate, the best time to examine your breasts is 5-7 days after your period is over. During your period, your breasts are lumpier, and it may be more difficult to notice changes. When should I see my health care provider? See your health care provider if you notice:  A change in shape or size of your breasts or nipples.  A change in the skin of your breast or nipples, such as a reddened or scaly area.  Unusual discharge from your nipples.  A lump or thick area that was not there before.  Pain in your breasts.  Anything that concerns you.  This information is not intended to replace advice given to you by your health care provider. Make sure you discuss any questions you have with your health care provider. Document Released: 06/18/2005 Document Revised: 11/24/2015 Document Reviewed: 05/08/2015 Elsevier Interactive Patient Education  2018 Elsevier Inc.  

## 2017-12-04 ENCOUNTER — Other Ambulatory Visit (HOSPITAL_COMMUNITY)
Admission: RE | Admit: 2017-12-04 | Discharge: 2017-12-04 | Disposition: A | Discharge status: Home / Self Care | Payer: BC Managed Care – PPO | Source: Ambulatory Visit | Attending: Obstetrics and Gynecology | Admitting: Obstetrics and Gynecology

## 2017-12-04 DIAGNOSIS — Z124 Encounter for screening for malignant neoplasm of cervix: Secondary | ICD-10-CM | POA: Insufficient documentation

## 2017-12-04 NOTE — Addendum Note (Signed)
Addended by: Tobi BastosJERTSON, JILL E on: 12/04/2017 10:53 AM   Modules accepted: Orders

## 2017-12-06 LAB — CYTOLOGY - PAP
DIAGNOSIS: NEGATIVE
HPV (WINDOPATH): NOT DETECTED

## 2017-12-09 NOTE — Telephone Encounter (Signed)
Ashley/Covermymeds [phone] called said the pharmacy has tried dispensing the medication thru primary insurance but it states a PA is needed. [name] said to submit the PA to [facility] and can call it in with VO or can submit to covermymeds with new key# [id number].

## 2017-12-10 NOTE — Telephone Encounter (Addendum)
Called and spoke with [name] ([facility] health PA department) and completed PA Ajovy over the phone. Dx: G43.019. PA approved from 12/10/17-9/11/9. RU#[id number].   Pt has tried/failed: allergy to wellbutrin, imitrex, gabapentin, topamax, flexeril, seroquel, DHE 45, phenergan, verapamil, toradol, naproxen, prednisone, lithium, depacon, propranolol, cymbalta, fioricet  Faxed notice of approval to [facility] att: Judeth CornfieldStephanie at [phone]. Received fax confirmation.

## 2017-12-10 NOTE — Telephone Encounter (Signed)
Received message back from patient's pharmacy that I need to call insurance at [phone] about PA request

## 2017-12-10 NOTE — Telephone Encounter (Signed)
I attempted PA again on covermymeds with Key number provided below. Got the following message again on covermymeds: "Please advise the dispensing pharmacy to contact the Pharmacy Help Line at [phone] for assistance."  I called pt pharmacy and spoke with pharmacist, Judeth CornfieldStephanie and relayed message above. She is going to contact them and call me back today if I need to do anything further. I verified they are running claim for 1 pen per month.

## 2017-12-12 ENCOUNTER — Telehealth: Payer: Self-pay | Admitting: Neurology

## 2017-12-12 MED ORDER — DEXAMETHASONE 2 MG PO TABS: ORAL_TABLET | ORAL | 0 refills | Status: DC

## 2017-12-12 NOTE — Telephone Encounter (Signed)
Pt called stating she has a minor migraine Tuesday 6/11 and went ahead and took  Fremanezumab-vfrm (AJOVY) 225 MG/1.5ML SOSY but since taking the injectable her migraine has gotten worse. Please call to advise

## 2017-12-12 NOTE — Telephone Encounter (Signed)
I called the patient.  The patient just started Ajovy, she has bad headache at this time, she does have Thorazine to take for nausea, I will send in a prescription for Decadron to help knock out the headache.  The headache is been going on for about 2 days.

## 2017-12-12 NOTE — Addendum Note (Signed)
Addended by: York SpanielWILLIS, Ercilia Bettinger K on: 12/12/2017 03:08 PM   Modules accepted: Orders

## 2017-12-13 ENCOUNTER — Other Ambulatory Visit: Payer: Self-pay | Admitting: Neurology

## 2017-12-13 MED ORDER — CHLORPROMAZINE HCL 100 MG PO TABS: 100.0000 mg | ORAL_TABLET | Freq: Two times a day (BID) | ORAL | 0 refills | Status: DC

## 2017-12-13 NOTE — Telephone Encounter (Signed)
Pt requesting a refill for chlorproMAZINE (THORAZINE) 100 MG tablet [facility]

## 2017-12-17 ENCOUNTER — Telehealth: Payer: Self-pay | Admitting: Obstetrics and Gynecology

## 2017-12-17 NOTE — Telephone Encounter (Signed)
I called the patient.She has a headache, the prescription for Thorazine was back ordered, she is unable to get it.  She will come in today and get a shot of Depacon.

## 2017-12-17 NOTE — Telephone Encounter (Addendum)
Pt called to advise thorazine has been ordered but has not come in yet and does not know when it will be available by [facility]. Pt said she has checked with [place] pharmacies [place] and it is on back order. She is wanting to know if there is something else that can be sent in, she said the decadron infusion has helped in the past. She take the decadron oral over the weekend but it did not help. Please call to advise at [phone]

## 2017-12-17 NOTE — Telephone Encounter (Signed)
Patient has decided she would like tot proceed with surgery ASAP.

## 2017-12-18 NOTE — Telephone Encounter (Signed)
Ashley/Covermymeds [phone] called to advise an error msg was being received due to the quantity but that has been fixed. Will need to use Ref key # [id number]. Please submit the quantity as 1.5 syringe per 30 day. Please resubmit.

## 2017-12-18 NOTE — Telephone Encounter (Signed)
I called [facility] and verified they are getting paid claims for Ajovy. Pt cost roughly 19 dollars. Nothing further needed. PA was already completed.

## 2017-12-19 NOTE — Telephone Encounter (Signed)
This patient has a h/o uncontrolled diabetes, she was supposed to be establishing care with a new MD at [facility] (at the beginning of June). She is going to need medical clearance. I told her that her hgbA1C would need to be under 7 to have surgery, she said it never is. It would certainly need to be under 8 and I need the Okay from her Endocrinologist or Primary.   I've reviewed her notes from [facility], last HgbA1C was 10.5, this is not safe for elective surgery. Increased risks including infection. I understand her frustration, she is much better off having another mirena IUD.

## 2017-12-19 NOTE — Telephone Encounter (Signed)
Patient is calling to follow up from call on 12/17/17 regarding surgery.

## 2017-12-19 NOTE — Telephone Encounter (Signed)
Call to patient. Per DPR can leave message on voice mail which has number confirmation. Left message calling to let her know we have received her message. Requested call back with date preferences. Will review with Dr Oscar LaJertson for instructions.

## 2017-12-19 NOTE — Telephone Encounter (Signed)
Patient returns call. States she is very anxious to proceed with having "tubes tied."  States she needs surgery and recovery to allow for return to work in mid-July.  Advised will review with Dr Oscar LaJertson for instructions and call her back.

## 2017-12-20 NOTE — Telephone Encounter (Signed)
Call to patient. Advised Dr Oscar LaJertson has reviewed call and update provided. Reviewed lab results as we have available to us. States [facility], and was referred to endocrinology. States no A1C was done.  Patient states her A!C is never below 7 or 8. Advised that this is an elective procedure and alternatives are available. Cannot schedule this without well controlled diabetes and clearance from PCP about BP and endocrinology about diabetes.  Offered Mirena. Patient states this was too painful and traumatic and that is why she wants something permanent. Advised we can work toward salpingectomy, when we receive the documentation that she is medically cleared for an elective procedure.  Patient states she will call to move up endocrine appointment. Advised her current timeline may not be realistic.       Encounter closed.

## 2018-01-01 DIAGNOSIS — E785 Hyperlipidemia, unspecified: Secondary | ICD-10-CM

## 2018-01-01 DIAGNOSIS — E1069 Type 1 diabetes mellitus with other specified complication: Secondary | ICD-10-CM | POA: Insufficient documentation

## 2018-01-05 ENCOUNTER — Encounter (HOSPITAL_COMMUNITY): Payer: Self-pay

## 2018-01-05 ENCOUNTER — Emergency Department (HOSPITAL_COMMUNITY): Payer: BC Managed Care – PPO

## 2018-01-05 ENCOUNTER — Emergency Department (HOSPITAL_COMMUNITY)
Admission: EM | Admit: 2018-01-05 | Discharge: 2018-01-05 | Disposition: A | Discharge status: Home / Self Care | Payer: BC Managed Care – PPO | Attending: Emergency Medicine | Admitting: Emergency Medicine

## 2018-01-05 ENCOUNTER — Other Ambulatory Visit: Payer: Self-pay

## 2018-01-05 DIAGNOSIS — Z794 Long term (current) use of insulin: Secondary | ICD-10-CM | POA: Diagnosis not present

## 2018-01-05 DIAGNOSIS — Z79899 Other long term (current) drug therapy: Secondary | ICD-10-CM | POA: Diagnosis not present

## 2018-01-05 DIAGNOSIS — R079 Chest pain, unspecified: Secondary | ICD-10-CM | POA: Diagnosis present

## 2018-01-05 DIAGNOSIS — E109 Type 1 diabetes mellitus without complications: Secondary | ICD-10-CM | POA: Diagnosis not present

## 2018-01-05 LAB — BASIC METABOLIC PANEL
ANION GAP: 6 (ref 5–15)
BUN: 8 mg/dL (ref 6–20)
CO2: 26 mmol/L (ref 22–32)
Calcium: 8.8 mg/dL — ABNORMAL LOW (ref 8.9–10.3)
Chloride: 109 mmol/L (ref 98–111)
Creatinine, Ser: 0.57 mg/dL (ref 0.44–1.00)
GFR calc Af Amer: >60 mL/min (ref >60)
GLUCOSE: 96 mg/dL (ref 70–99)
POTASSIUM: 3.8 mmol/L (ref 3.5–5.1)
Sodium: 141 mmol/L (ref 135–145)

## 2018-01-05 LAB — CBC
HEMATOCRIT: 41.8 % (ref 36.0–46.0)
Hemoglobin: 14.1 g/dL (ref 12.0–15.0)
MCH: 29.8 pg (ref 26.0–34.0)
MCHC: 33.7 g/dL (ref 30.0–36.0)
MCV: 88.4 fL (ref 78.0–100.0)
Platelets: 464 10*3/uL — ABNORMAL HIGH (ref 150–400)
RBC: 4.73 MIL/uL (ref 3.87–5.11)
RDW: 13.2 % (ref 11.5–15.5)
WBC: 7.4 10*3/uL (ref 4.0–10.5)

## 2018-01-05 LAB — I-STAT TROPONIN, ED: Troponin i, poc: 0 ng/mL (ref 0.00–0.08)

## 2018-01-05 LAB — I-STAT BETA HCG BLOOD, ED (MC, WL, AP ONLY): I-stat hCG, quantitative: <5 m[IU]/mL (ref <5)

## 2018-01-05 LAB — D-DIMER, QUANTITATIVE: D-Dimer, Quant: 0.28 ug/mL-FEU (ref 0.00–0.50)

## 2018-01-05 LAB — LIPASE, BLOOD: Lipase: 23 U/L (ref 11–51)

## 2018-01-05 MED ORDER — SODIUM CHLORIDE 0.9 % IV BOLUS
1000.0000 mL | Freq: Once | INTRAVENOUS | Status: AC
  Administered 2018-01-05: 1000 mL via INTRAVENOUS

## 2018-01-05 MED ORDER — GI COCKTAIL ~~LOC~~
30.0000 mL | Freq: Once | ORAL | Status: AC
  Administered 2018-01-05: 30 mL via ORAL
  Filled 2018-01-05: qty 30

## 2018-01-05 MED ORDER — PANTOPRAZOLE SODIUM 20 MG PO TBEC: 20.0000 mg | DELAYED_RELEASE_TABLET | Freq: Every day | ORAL | 0 refills | Status: DC

## 2018-01-05 NOTE — ED Provider Notes (Signed)
[facility]-EMERGENCY DEPT Provider Note   CSN: [id number] Arrival date & time: 01/05/18  1010     History   Chief Complaint Chief Complaint  Patient presents with  . Chest Pain    HPI [name] is a 42 y.o. female.  Pt presents to the ED today with cp.  Pt said she woke up around 0230 with the pain.  She has had some sob and cough as well.  She was seen by her pcp and dx'd with a murmur.  She has outpatient cardiology f/u scheduled, but has not seen them yet.  The pt has also recently changed her insulin which has helped control her bs.     Past Medical History:  Diagnosis Date  . Cluster headache syndrome, intractable   . Depression   . Migraine without aura, with intractable migraine, so stated, without mention of status migrainosus 12/05/2012  . Ovarian cyst   . STD (sexually transmitted disease)    HSV II   . Type I diabetes mellitus [facility])     Patient Active Problem List   Diagnosis Date Noted  . DKA, type 1 (HCC) 02/14/2015  . DKA (diabetic ketoacidoses) (HCC) 02/14/2015  . Thyroid nodule 02/14/2015  . Depression with anxiety 02/14/2015  . Acute kidney injury (HCC) 02/14/2015  . Leukocytosis 02/14/2015  . Increased anion gap metabolic acidosis 02/14/2015  . Recurrent major depression-severe (HCC) 01/06/2013  . Generalized anxiety disorder 01/06/2013  . Common migraine with intractable migraine 12/05/2012  . IDDM (insulin dependent diabetes mellitus) (HCC) 12/05/2012    Past Surgical History:  Procedure Laterality Date  . APPENDECTOMY    . BUNIONECTOMY Bilateral   . CHOLECYSTECTOMY    . OVARIAN CYST REMOVAL       OB History    Gravida  0   Para  0   Term  0   Preterm  0   AB  0   Living  0     SAB  0   TAB  0   Ectopic  0   Multiple  0   Live Births  0            Home Medications    Prior to Admission medications   Medication Sig Start Date End Date Taking? Authorizing Provider    amphetamine-dextroamphetamine (ADDERALL) 20 MG tablet Take 20 mg by mouth 3 (three) times daily.  10/24/17  Yes [provider]  Brexpiprazole (REXULTI) 1 MG TABS Take 1 tablet (1 mg total) by mouth daily. Patient taking differently: Take 2 mg by mouth daily.  11/18/17  Yes York Spaniel, MD  butalbital-acetaminophen-caffeine (FIORICET, ESGIC) [phone] MG tablet Take 1 tablet by mouth every 6 (six) hours as needed for headache. 11/18/17  Yes York Spaniel, MD  chlorproMAZINE (THORAZINE) 100 MG tablet Take 1 tablet (100 mg total) by mouth 2 (two) times daily. Patient taking differently: Take 100 mg by mouth 2 (two) times daily as needed (migraine).  12/13/17  Yes York Spaniel, MD  clonazePAM (KLONOPIN) 0.5 MG tablet Take 0.5 mg by mouth 3 (three) times daily as needed for anxiety. For anxiety 01/09/13  Yes Thermon Leyland, NP  Fremanezumab-vfrm (AJOVY) 225 MG/1.5ML SOSY Inject 1 pen into the skin every 30 (thirty) days.   Yes [provider]  Insulin Aspart, w/Niacinamide, (FIASP FLEXTOUCH) 100 UNIT/ML SOPN Inject 1-10 Units into the skin 3 (three) times daily with meals. Per sliding scale   Yes [provider]  insulin degludec (TRESIBA FLEXTOUCH) 100 UNIT/ML SOPN FlexTouch Pen Inject 34 Units into the skin at bedtime.   Yes [provider]  levonorgestrel (MIRENA) 20 MCG/24HR IUD 1 each by Intrauterine route once. Placed 06/2014   Yes [provider]  Multiple Vitamin (MULTIVITAMIN WITH MINERALS) TABS Take 1 tablet by mouth daily. 01/09/13  Yes Thermon Leylandavis, Laura A, NP  ondansetron (ZOFRAN) 4 MG tablet Take 1 tablet (4 mg total) by mouth 3 (three) times daily as needed. 11/18/17  Yes York SpanielWillis, Charles K, MD  traZODone (DESYREL) 50 MG tablet Take 50 mg by mouth at bedtime as needed for sleep.  10/08/17  Yes [provider]  Vilazodone HCl (VIIBRYD) 20 MG TABS Take 1 tablet (20 mg total) by mouth daily. 11/18/17  Yes York SpanielWillis, Charles K, MD  zolpidem  (AMBIEN) 10 MG tablet Take 10 mg by mouth at bedtime as needed for sleep.    Yes [provider]  dexamethasone (DECADRON) 2 MG tablet Take 3 tablets the first day, 2 the second and 1 the third day Patient not taking: Reported on 01/05/2018 12/12/17   York SpanielWillis, Charles K, MD  pantoprazole (PROTONIX) 20 MG tablet Take 1 tablet (20 mg total) by mouth daily. 01/05/18   Jacalyn LefevreHaviland, Coutney Wildermuth, MD  promethazine (PHENERGAN) 25 MG tablet Take 1 tablet (25 mg total) every 6 (six) hours as needed by mouth for nausea or vomiting. Patient not taking: Reported on 01/05/2018 05/14/17   Eyvonne MechanicHedges, Jeffrey, PA-C    Family History Family History  Adopted: Yes  Problem Relation Age of Onset  . Diabetes Father     Social History Social History   Tobacco Use  . Smoking status: Never Smoker  . Smokeless tobacco: Never Used  Substance Use Topics  . Alcohol use: Yes    Comment: rare  . Drug use: No     Allergies   Gluten meal; Sulfa antibiotics; Wellbutrin [bupropion]; Amoxicillin; and Codeine   Review of Systems Review of Systems  Respiratory: Positive for cough and shortness of breath.   Cardiovascular: Positive for chest pain.  All other systems reviewed and are negative.    Physical Exam Updated Vital Signs BP 129/74   Pulse 93   Temp 98.8 F (37.1 C) (Oral)   Resp 15   Ht 5\' 1"  (1.549 m)   Wt 69.9 kg (154 lb)   SpO2 97%   BMI 29.10 kg/m   Physical Exam  Constitutional: She is oriented to person, place, and time. She appears well-developed and well-nourished.  HENT:  Head: Normocephalic and atraumatic.  Eyes: EOM are normal.  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 Tachycardia present.  Pulmonary/Chest: Effort normal and breath sounds normal.  Abdominal: Soft. Bowel sounds are normal.  Musculoskeletal: Normal range of motion.       Right lower leg: Normal.       Left lower leg: Normal.  Neurological: She is  alert and oriented to person, place, and time.  Skin: Skin is warm. Capillary refill takes less than 2 seconds.  Psychiatric: She has a normal mood and affect. Her behavior is normal.  Nursing note and vitals reviewed.    ED Treatments / Results  Labs (all labs ordered are listed, but only abnormal results are displayed) Labs Reviewed  CBC - Abnormal; Notable for the following components:      Result Value   Platelets 464 (*)    All other components within normal limits  BASIC METABOLIC PANEL - Abnormal; Notable for the following components:   Calcium 8.8 (*)    All other components within normal limits  LIPASE, BLOOD  D-DIMER, QUANTITATIVE (NOT AT [facility])  I-STAT TROPONIN, ED  I-STAT BETA HCG BLOOD, ED ([facility], [facility], [facility] ONLY)    EKG EKG Interpretation  Date/Time:  Sunday January 05 2018 10:16:00 EDT Ventricular Rate:  115 PR Interval:    QRS Duration: 71 QT Interval:  308 QTC Calculation: 426 R Axis:   88 Text Interpretation:  Sinus tachycardia Borderline T abnormalities, inferior leads No significant change since last tracing Confirmed by Ellin Fitzgibbons ([id number]) on 01/05/2018 10:23:10 AM Also confirmed by Sharna Gabrys ([id number]), editor Belcher, Jessica ([id number])  on 01/05/2018 11:10:25 AM   Radiology Dg Chest 2 View  Result Date: 01/05/2018 CLINICAL DATA:  Chest pain EXAM: CHEST - 2 VIEW COMPARISON:  June 27, 2016 FINDINGS: Lungs are clear. Heart size and pulmonary vascularity are normal. No adenopathy. No pneumothorax. No bone lesions. IMPRESSION: No edema or consolidation. Electronically Signed   By: William  Woodruff III M.D.   On: 01/05/2018 11:06    Procedures Procedures (including critical care time)  Medications Ordered in ED Medications  sodium chloride 0.9 % bolus 1,000 mL (1,000 mLs Intravenous New Bag/Given 01/05/18 1213)  gi cocktail (Maalox,Lidocaine,Donnatal) (30 mLs Oral Given 01/05/18 1213)     Initial Impression / Assessment and Plan / ED Course  I have  reviewed the triage vital signs and the nursing notes.  Pertinent labs & imaging results that were available during my care of the patient were reviewed by me and considered in my medical decision making (see chart for details).    Hr is better.  BS much better after seeing endocrinologist.  Low risk for CAD.  Cardiology appt this week.  Pt likely with gerd.  She knows to return if worse.  F/u with cards and with pcp.  Final Clinical Impressions(s) / ED Diagnoses   Final diagnoses:  Chest pain, unspecified type    ED Discharge Orders        Ordered    pantoprazole (PROTONIX) 20 MG tablet  Daily     07 /07/19 1347       Jacalyn Lefevre, MD 01/05/18 1349

## 2018-01-05 NOTE — ED Triage Notes (Signed)
Pt states she was woken up at 0230 today with sternal/epigastric area pain. Pt c/o that she was recently dx with a heart murmur, and has plans to visit a cardiologist this week. Pt states that the pain has increased since 0230. Pt also states she has had a cough lately.

## 2018-02-03 ENCOUNTER — Institutional Professional Consult (permissible substitution): Payer: BC Managed Care – PPO | Admitting: Neurology

## 2018-02-03 ENCOUNTER — Encounter

## 2018-03-21 ENCOUNTER — Other Ambulatory Visit: Payer: Self-pay

## 2018-03-21 ENCOUNTER — Encounter (HOSPITAL_COMMUNITY): Payer: Self-pay

## 2018-03-21 ENCOUNTER — Emergency Department (HOSPITAL_COMMUNITY): Payer: BC Managed Care – PPO

## 2018-03-21 ENCOUNTER — Emergency Department (HOSPITAL_COMMUNITY)
Admission: EM | Admit: 2018-03-21 | Discharge: 2018-03-21 | Disposition: A | Discharge status: Home / Self Care | Payer: BC Managed Care – PPO | Attending: Emergency Medicine | Admitting: Emergency Medicine

## 2018-03-21 DIAGNOSIS — K529 Noninfective gastroenteritis and colitis, unspecified: Secondary | ICD-10-CM | POA: Diagnosis not present

## 2018-03-21 DIAGNOSIS — R739 Hyperglycemia, unspecified: Secondary | ICD-10-CM

## 2018-03-21 DIAGNOSIS — E1065 Type 1 diabetes mellitus with hyperglycemia: Secondary | ICD-10-CM | POA: Insufficient documentation

## 2018-03-21 DIAGNOSIS — Z79899 Other long term (current) drug therapy: Secondary | ICD-10-CM | POA: Diagnosis not present

## 2018-03-21 DIAGNOSIS — R112 Nausea with vomiting, unspecified: Secondary | ICD-10-CM | POA: Diagnosis present

## 2018-03-21 LAB — CBG MONITORING, ED
GLUCOSE-CAPILLARY: 335 mg/dL — AB (ref 70–99)
Glucose-Capillary: 423 mg/dL — ABNORMAL HIGH (ref 70–99)
Glucose-Capillary: 234 mg/dL — ABNORMAL HIGH (ref 70–99)

## 2018-03-21 LAB — BASIC METABOLIC PANEL
Anion gap: 8 (ref 5–15)
BUN: 14 mg/dL (ref 6–20)
CALCIUM: 9.2 mg/dL (ref 8.9–10.3)
CHLORIDE: 104 mmol/L (ref 98–111)
CO2: 25 mmol/L (ref 22–32)
CREATININE: 0.88 mg/dL (ref 0.44–1.00)
GFR calc non Af Amer: >60 mL/min (ref >60)
GLUCOSE: 357 mg/dL — AB (ref 70–99)
Potassium: 3.9 mmol/L (ref 3.5–5.1)
Sodium: 137 mmol/L (ref 135–145)

## 2018-03-21 LAB — I-STAT BETA HCG BLOOD, ED (MC, WL, AP ONLY): I-stat hCG, quantitative: <5 m[IU]/mL (ref <5)

## 2018-03-21 LAB — CBC
HCT: 37.1 % (ref 36.0–46.0)
HEMOGLOBIN: 12.5 g/dL (ref 12.0–15.0)
MCH: 30.1 pg (ref 26.0–34.0)
MCHC: 33.7 g/dL (ref 30.0–36.0)
MCV: 89.4 fL (ref 78.0–100.0)
PLATELETS: 351 10*3/uL (ref 150–400)
RBC: 4.15 MIL/uL (ref 3.87–5.11)
RDW: 12.9 % (ref 11.5–15.5)
WBC: 10.5 10*3/uL (ref 4.0–10.5)

## 2018-03-21 LAB — URINALYSIS, ROUTINE W REFLEX MICROSCOPIC
BILIRUBIN URINE: NEGATIVE
Glucose, UA: >=500 mg/dL — AB
HGB URINE DIPSTICK: NEGATIVE
Ketones, ur: NEGATIVE mg/dL
Leukocytes, UA: NEGATIVE
Nitrite: NEGATIVE
PROTEIN: NEGATIVE mg/dL
Specific Gravity, Urine: 1.015 (ref 1.005–1.030)
pH: 6 (ref 5.0–8.0)

## 2018-03-21 LAB — PREGNANCY, URINE: Preg Test, Ur: NEGATIVE

## 2018-03-21 MED ORDER — ONDANSETRON 4 MG PO TBDP: ORAL_TABLET | ORAL | 0 refills | Status: DC

## 2018-03-21 MED ORDER — METOCLOPRAMIDE HCL 5 MG/ML IJ SOLN
10.0000 mg | Freq: Once | INTRAMUSCULAR | Status: AC
  Administered 2018-03-21: 10 mg via INTRAVENOUS
  Filled 2018-03-21: qty 2

## 2018-03-21 MED ORDER — ONDANSETRON HCL 4 MG/2ML IJ SOLN
4.0000 mg | Freq: Once | INTRAMUSCULAR | Status: AC
  Administered 2018-03-21: 4 mg via INTRAVENOUS
  Filled 2018-03-21: qty 2

## 2018-03-21 MED ORDER — ACETAMINOPHEN 500 MG PO TABS
1000.0000 mg | ORAL_TABLET | Freq: Once | ORAL | Status: AC
  Administered 2018-03-21: 1000 mg via ORAL
  Filled 2018-03-21: qty 2

## 2018-03-21 MED ORDER — KETOROLAC TROMETHAMINE 30 MG/ML IJ SOLN
30.0000 mg | Freq: Once | INTRAMUSCULAR | Status: AC
  Administered 2018-03-21: 30 mg via INTRAVENOUS
  Filled 2018-03-21: qty 1

## 2018-03-21 MED ORDER — SODIUM CHLORIDE 0.9 % IV BOLUS
1000.0000 mL | Freq: Once | INTRAVENOUS | Status: AC
  Administered 2018-03-21: 1000 mL via INTRAVENOUS

## 2018-03-21 NOTE — ED Notes (Signed)
Patient would like to wait until the IV is started due to "being a hard stick." RN Silvio PateShelia notified

## 2018-03-21 NOTE — ED Provider Notes (Signed)
[facility]-EMERGENC[facility] DEPT Provider Note   CSN: [id number] Arrival date & time: 03/21/18  1415     History   Chief Complaint Chief Complaint  Patient presents with  . Hyperglycemia  . Nausea  . tongue pain    HPI Donyelle Enyeart is a 42 y.o. female.  Patient is a 42 year old female with a history of diabetes who presents with nausea and vomiting.  She states that she has had a fever for the last 2 days but none today.  This was subjective fever and she did not check her temperature.  She has had some myalgias and a little bit of runny nose.  She has a little bit of coughing.  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  She has a little bit of diarrhea.  No abdominal pain.  No sores or rashes.  She uses injectable insulin.  She has been correcting today but states her sugars have still been running high.     Past Medical History:  Diagnosis Date  . Cluster headache syndrome, intractable   . Depression   . Migraine without aura, with intractable migraine, so stated, without mention of status migrainosus 12/05/2012  . Ovarian cyst   . STD (sexually transmitted disease)    HSV II   . Type I diabetes mellitus [facility])     Patient Active Problem List   Diagnosis Date Noted  . DKA, type 1 (HCC) 02/14/2015  . DKA (diabetic ketoacidoses) (HCC) 02/14/2015  . Thyroid nodule 02/14/2015  . Depression with anxiety 02/14/2015  . Acute kidney injury (HCC) 02/14/2015  . Leukocytosis 02/14/2015  . Increased anion gap metabolic acidosis 02/14/2015  . Recurrent major depression-severe (HCC) 01/06/2013  . Generalized anxiety disorder 01/06/2013  . Common migraine with intractable migraine 12/05/2012  . IDDM (insulin dependent diabetes mellitus) (HCC) 12/05/2012    Past Surgical History:  Procedure Laterality Date  . APPENDECTOMY    . BUNIONECTOMY Bilateral   . CHOLECYSTECTOMY    . OVARIAN CYST REMOVAL       OB  History    Gravida  0   Para  0   Term  0   Preterm  0   AB  0   Living  0     SAB  0   TAB  0   Ectopic  0   Multiple  0   Live Births  0            Home Medications    Prior to Admission medications   Medication Sig Start Date End Date Taking? Authorizing Provider  amphetamine-dextroamphetamine (ADDERALL) 20 MG tablet Take 20 mg by mouth 3 (three) times daily.  10/24/17  Yes [provider]  aspirin-sod bicarb-citric acid (ALKA-SELTZER) 325 MG TBEF tablet Take 325 mg by mouth every 6 (six) hours as needed (indigestion).   Yes [provider]  Brexpiprazole (REXULTI) 1 MG TABS Take 1 tablet (1 mg total) by mouth daily. 11/18/17  Yes York Spaniel, MD  butalbital-acetaminophen-caffeine (FIORICET, ESGIC) [phone] MG tablet Take 1 tablet by mouth every 6 (six) hours as needed for headache. 11/18/17  Yes York Spaniel, MD  chlorproMAZINE (THORAZINE) 100 MG tablet Take 1 tablet (100 mg total) by mouth 2 (two) times daily. Patient taking differently: Take 100 mg by mouth 2 (two) times daily as needed (migraine).  12/13/17  Yes York Spaniel, MD  Fremanezumab-vfrm (AJOVY) 225 MG/1.5ML  SOSY Inject 1 pen into the skin every 30 (thirty) days.   Yes [provider]  Insulin Aspart, w/Niacinamide, (FIASP FLEXTOUCH) 100 UNIT/ML SOPN Inject 1-10 Units into the skin 3 (three) times daily with meals. Per sliding scale   Yes [provider]  insulin degludec (TRESIBA FLEXTOUCH) 100 UNIT/ML SOPN FlexTouch Pen Inject 20 Units into the skin at bedtime.    Yes [provider]  levonorgestrel (MIRENA) 20 MCG/24HR IUD 1 each by Intrauterine route once. Placed 06/2014   Yes [provider]  Multiple Vitamin (MULTIVITAMIN WITH MINERALS) TABS Take 1 tablet by mouth daily. 01/09/13  Yes Thermon Leyland, NP  traZODone (DESYREL) 50 MG tablet Take 50 mg by mouth at bedtime as needed for sleep.  10/08/17  Yes [provider]  VIIBRYD  40 MG TABS Take 40 mg by mouth daily.  03/13/18  Yes [provider]  clonazePAM (KLONOPIN) 0.5 MG tablet Take 0.5 mg by mouth 3 (three) times daily as needed for anxiety. For anxiety 01/09/13   Thermon Leyland, NP  dexamethasone (DECADRON) 2 MG tablet Take 3 tablets the first day, 2 the second and 1 the third day Patient not taking: Reported on 01/05/2018 12/12/17   York Spaniel, MD  ondansetron (ZOFRAN ODT) 4 MG disintegrating tablet 4mg  ODT q4 hours prn nausea/vomit 03/21/18   Rolan Bucco, MD  pantoprazole (PROTONIX) 20 MG tablet Take 1 tablet (20 mg total) by mouth daily. Patient not taking: Reported on 03/21/2018 01/05/18   Jacalyn Lefevre, MD  promethazine (PHENERGAN) 25 MG tablet Take 1 tablet (25 mg total) every 6 (six) hours as needed by mouth for nausea or vomiting. Patient not taking: Reported on 01/05/2018 05/14/17   Hedges, Tinnie Gens, PA-C  Vilazodone HCl (VIIBRYD) 20 MG TABS Take 1 tablet (20 mg total) by mouth daily. Patient not taking: Reported on 03/21/2018 11/18/17   York Spaniel, MD  zolpidem (AMBIEN) 10 MG tablet Take 10 mg by mouth at bedtime as needed for sleep.     [provider]    Family History Family History  Adopted: Yes  Problem Relation Age of Onset  . Diabetes Father     Social History Social History   Tobacco Use  . Smoking status: Never Smoker  . Smokeless tobacco: Never Used  Substance Use Topics  . Alcohol use: Yes    Comment: rare  . Drug use: No     Allergies   Gluten meal; Sulfa antibiotics; Wellbutrin [bupropion]; Amoxicillin; and Codeine   Review of Systems Review of Systems  Constitutional: Positive for fatigue and fever. Negative for chills and diaphoresis.  HENT: Positive for rhinorrhea. Negative for congestion and sneezing.   Eyes: Negative.   Respiratory: Positive for cough. Negative for chest tightness and shortness of breath.   Cardiovascular: Negative for chest pain and leg swelling.  Gastrointestinal:  Positive for diarrhea, nausea and vomiting. Negative for abdominal pain and blood in stool.  Genitourinary: Negative for difficulty urinating, flank pain, frequency and hematuria.  Musculoskeletal: Positive for myalgias. Negative for arthralgias and back pain.  Skin: Negative for rash.  Neurological: Negative for dizziness, speech difficulty, weakness, numbness and headaches.     Physical Exam Updated Vital Signs BP 110/64   Pulse 78   Temp 99.4 F (37.4 C) (Oral)   Resp 14   Ht 5\' 1"  (1.549 m)   Wt 70.3 kg   SpO2 99%   BMI 29.29 kg/m   Physical Exam  Constitutional: She is  oriented to person, place, and time. She appears well-developed and well-nourished.  HENT:  Head: Normocephalic and atraumatic.  Slightly dry mucous membranes  Eyes: Pupils are equal, round, and reactive to light.  Neck: Normal range of motion. Neck supple.  Cardiovascular: Regular rhythm and normal heart sounds. Tachycardia present.  Pulmonary/Chest: Effort normal and breath sounds normal. No respiratory distress. She has no wheezes. She has no rales. She exhibits no tenderness.  Abdominal: Soft. Bowel sounds are normal. There is no tenderness. There is no rebound and no guarding.  Musculoskeletal: Normal range of motion. She exhibits no edema.  Lymphadenopathy:    She has no cervical adenopathy.  Neurological: She is alert and oriented to person, place, and time.  Skin: Skin is warm and dry. No rash noted.  Psychiatric: She has a normal mood and affect.     ED Treatments / Results  Labs (all labs ordered are listed, but only abnormal results are displayed) Labs Reviewed  BASIC METABOLIC PANEL - Abnormal; Notable for the following components:      Result Value   Glucose, Bld 357 (*)    All other components within normal limits  URINALYSIS, ROUTINE W REFLEX MICROSCOPIC - Abnormal; Notable for the following components:   Color, Urine STRAW (*)    Glucose, UA >=500 (*)    Bacteria, UA RARE (*)     All other components within normal limits  CBG MONITORING, ED - Abnormal; Notable for the following components:   Glucose-Capillary 423 (*)    All other components within normal limits  CBG MONITORING, ED - Abnormal; Notable for the following components:   Glucose-Capillary 335 (*)    All other components within normal limits  CBG MONITORING, ED - Abnormal; Notable for the following components:   Glucose-Capillary 234 (*)    All other components within normal limits  CBC  PREGNANCY, URINE  I-STAT BETA HCG BLOOD, ED ([facility], [facility], [facility] ONLY)    EKG EKG Interpretation  Date/Time:  Friday March 21 2018 15:26:31 EDT Ventricular Rate:  95 PR Interval:    QRS Duration: 75 QT Interval:  342 QTC Calculation: 430 R Axis:   76 Text Interpretation:  Sinus rhythm Borderline T abnormalities, inferior leads since last tracing no significant change Confirmed by Rolan Bucco [id number]) on 03/21/2018 3:45:11 PM   Radiology Dg Chest 2 View  Result Date: 03/21/2018 CLINICAL DATA:  Fever EXAM: CHEST - 2 VIEW COMPARISON:  January 05, 2018 FINDINGS: Lungs are clear. Heart size and pulmonary vascularity are normal. No adenopathy. No bone lesions. IMPRESSION: No edema or consolidation. Electronically Signed   By: Bretta Bang III M.D.   On: 03/21/2018 16:02    Procedures Procedures (including critical care time)  Medications Ordered in ED Medications  sodium chloride 0.9 % bolus 1,000 mL (0 mLs Intravenous Stopped 03/21/18 1732)  ondansetron (ZOFRAN) injection 4 mg (4 mg Intravenous Given 03/21/18 1621)  acetaminophen (TYLENOL) tablet 1,000 mg (1,000 mg Oral Given 03/21/18 1712)  metoCLOPramide (REGLAN) injection 10 mg (10 mg Intravenous Given 03/21/18 1713)  ketorolac (TORADOL) 30 MG/ML injection 30 mg (30 mg Intravenous Given 03/21/18 1805)     Initial Impression / Assessment and Plan / ED Course  I have reviewed the triage vital signs and the nursing notes.  Pertinent labs & imaging results that  were available during my care of the patient were reviewed by me and considered in my medical decision making (see chart for details).     Patient is a 42 year old  female with a history of diabetes who presents with hyperglycemia and vomiting.  She has no evidence of DKA.  Her glucose is elevated but she has no ketones.  No anion gap.  She was given IV fluids and antiemetics.  She is feeling better.  Her tachycardia has resolved.  Her glucose has improved.  Her other labs are non-concerning.  Her white count is normal.  Her LFTs are normal.  She does have some mild ongoing pain to her right lower abdomen but she does not want to stay for any further evaluation.  I feel this is likely viral but I discussed doing CT imaging of her abdomen and she is refusing this currently.  She was discharged home in good condition.  She was given a prescription for Zofran.  She was encouraged to have close follow-up with her PCP for reevaluation.  Return precautions were given.  Final Clinical Impressions(s) / ED Diagnoses   Final diagnoses:  Gastroenteritis  Hyperglycemia    ED Discharge Orders         Ordered    ondansetron (ZOFRAN ODT) 4 MG disintegrating tablet     03/21/18 2036           Rolan BuccoBelfi, Cabela Pacifico, MD 03/21/18 2056

## 2018-03-21 NOTE — ED Notes (Signed)
Patient transported to X-ray 

## 2018-03-21 NOTE — ED Notes (Signed)
Diet gingerale given

## 2018-03-21 NOTE — ED Triage Notes (Addendum)
Per EMS- Patient is coming from work and c/o polydipsia, N/V. [id number]-[id number] prior to arrival to the ED. EMS unable to obtain an IV.  Patient also c/o tongue pain x 2 days.

## 2018-03-27 ENCOUNTER — Telehealth: Payer: Self-pay | Admitting: *Deleted

## 2018-03-27 NOTE — Telephone Encounter (Signed)
Previous PA for Ajovy expired on 03/12/18. Need to complete another. Received request from [facility] [place] for this.   Initiated PA on covermymeds. KeyCrista Curb - PA Case ID: [id number] - Rx #: [id number].

## 2018-03-27 NOTE — Telephone Encounter (Signed)
LMVM for pt to return call concerning ajovy (how it is working for her),  Need this information prior to PA.

## 2018-03-27 NOTE — Telephone Encounter (Signed)
I called pt. She states she has noticed improvement in her migraines since starting Ajovy.    Submitted PA. Waiting on determination.  Your information has been submitted to [employer]. If [employer] has not responded to your request within 24 hours, contact [employer] at [phone]".

## 2018-03-28 NOTE — Telephone Encounter (Signed)
Received [employer] approval of Ajovy 225 mg per cover my meds. Approval 03/27/18 - 03/28/2019.

## 2018-03-28 NOTE — Telephone Encounter (Signed)
Fax received from [employer], phone# [phone].  Ajovy PA approved for dates 03/27/18 thru 03/28/19.  PA# [facility] Plan [phone] Non-Grandfathered [id number] [place]/fim

## 2018-04-01 ENCOUNTER — Emergency Department (HOSPITAL_COMMUNITY): Payer: BC Managed Care – PPO

## 2018-04-01 ENCOUNTER — Emergency Department (HOSPITAL_COMMUNITY)
Admission: EM | Admit: 2018-04-01 | Discharge: 2018-04-01 | Disposition: A | Discharge status: Home / Self Care | Payer: BC Managed Care – PPO | Attending: Emergency Medicine | Admitting: Emergency Medicine

## 2018-04-01 ENCOUNTER — Other Ambulatory Visit: Payer: Self-pay

## 2018-04-01 ENCOUNTER — Encounter (HOSPITAL_COMMUNITY): Payer: Self-pay | Admitting: Emergency Medicine

## 2018-04-01 DIAGNOSIS — K5909 Other constipation: Secondary | ICD-10-CM

## 2018-04-01 DIAGNOSIS — Z9049 Acquired absence of other specified parts of digestive tract: Secondary | ICD-10-CM | POA: Insufficient documentation

## 2018-04-01 DIAGNOSIS — F411 Generalized anxiety disorder: Secondary | ICD-10-CM | POA: Diagnosis not present

## 2018-04-01 DIAGNOSIS — E109 Type 1 diabetes mellitus without complications: Secondary | ICD-10-CM | POA: Diagnosis not present

## 2018-04-01 DIAGNOSIS — F329 Major depressive disorder, single episode, unspecified: Secondary | ICD-10-CM | POA: Insufficient documentation

## 2018-04-01 DIAGNOSIS — R1032 Left lower quadrant pain: Secondary | ICD-10-CM | POA: Diagnosis present

## 2018-04-01 DIAGNOSIS — R112 Nausea with vomiting, unspecified: Secondary | ICD-10-CM | POA: Insufficient documentation

## 2018-04-01 DIAGNOSIS — Z79899 Other long term (current) drug therapy: Secondary | ICD-10-CM | POA: Diagnosis not present

## 2018-04-01 LAB — BLOOD GAS, VENOUS
Acid-Base Excess: 0.4 mmol/L (ref 0.0–2.0)
Bicarbonate: 25.3 mmol/L (ref 20.0–28.0)
O2 Saturation: 66.1 %
PCO2 VEN: 44.2 mmHg (ref 44.0–60.0)
PH VEN: 7.377 (ref 7.250–7.430)
PO2 VEN: 35 mmHg (ref 32.0–45.0)
Patient temperature: 98.6

## 2018-04-01 LAB — CBC WITH DIFFERENTIAL/PLATELET
BASOS PCT: 1 %
Basophils Absolute: 0 10*3/uL (ref 0.0–0.1)
EOS ABS: 0.3 10*3/uL (ref 0.0–0.7)
Eosinophils Relative: 5 %
HEMATOCRIT: 42.3 % (ref 36.0–46.0)
HEMOGLOBIN: 13.9 g/dL (ref 12.0–15.0)
Lymphocytes Relative: 40 %
Lymphs Abs: 2.2 10*3/uL (ref 0.7–4.0)
MCH: 29.4 pg (ref 26.0–34.0)
MCHC: 32.9 g/dL (ref 30.0–36.0)
MCV: 89.4 fL (ref 78.0–100.0)
MONO ABS: 0.4 10*3/uL (ref 0.1–1.0)
Monocytes Relative: 6 %
NEUTROS ABS: 2.8 10*3/uL (ref 1.7–7.7)
Neutrophils Relative %: 48 %
Platelets: 384 10*3/uL (ref 150–400)
RBC: 4.73 MIL/uL (ref 3.87–5.11)
RDW: 12.8 % (ref 11.5–15.5)
WBC: 5.6 10*3/uL (ref 4.0–10.5)

## 2018-04-01 LAB — COMPREHENSIVE METABOLIC PANEL
ALBUMIN: 4 g/dL (ref 3.5–5.0)
ALT: 14 U/L (ref 0–44)
ANION GAP: 8 (ref 5–15)
AST: 14 U/L — AB (ref 15–41)
Alkaline Phosphatase: 51 U/L (ref 38–126)
BILIRUBIN TOTAL: 0.4 mg/dL (ref 0.3–1.2)
BUN: 11 mg/dL (ref 6–20)
CALCIUM: 9.2 mg/dL (ref 8.9–10.3)
CO2: 25 mmol/L (ref 22–32)
CREATININE: 0.91 mg/dL (ref 0.44–1.00)
Chloride: 105 mmol/L (ref 98–111)
GFR calc Af Amer: >60 mL/min (ref >60)
GFR calc non Af Amer: >60 mL/min (ref >60)
GLUCOSE: 236 mg/dL — AB (ref 70–99)
Potassium: 3.8 mmol/L (ref 3.5–5.1)
SODIUM: 138 mmol/L (ref 135–145)
Total Protein: 6.9 g/dL (ref 6.5–8.1)

## 2018-04-01 LAB — URINALYSIS, ROUTINE W REFLEX MICROSCOPIC
Bilirubin Urine: NEGATIVE
GLUCOSE, UA: 150 mg/dL — AB
KETONES UR: NEGATIVE mg/dL
LEUKOCYTES UA: NEGATIVE
Nitrite: NEGATIVE
PROTEIN: NEGATIVE mg/dL
Specific Gravity, Urine: 1.005 (ref 1.005–1.030)
pH: 6 (ref 5.0–8.0)

## 2018-04-01 LAB — LIPASE, BLOOD: Lipase: 25 U/L (ref 11–51)

## 2018-04-01 LAB — I-STAT BETA HCG BLOOD, ED (MC, WL, AP ONLY): I-stat hCG, quantitative: <5 m[IU]/mL (ref <5)

## 2018-04-01 MED ORDER — SODIUM CHLORIDE 0.9 % IV BOLUS
1000.0000 mL | Freq: Once | INTRAVENOUS | Status: AC
  Administered 2018-04-01: 1000 mL via INTRAVENOUS

## 2018-04-01 MED ORDER — DICYCLOMINE HCL 20 MG PO TABS: 20.0000 mg | ORAL_TABLET | Freq: Two times a day (BID) | ORAL | 0 refills | Status: DC

## 2018-04-01 MED ORDER — POLYETHYLENE GLYCOL 3350 17 G PO PACK: 17.0000 g | PACK | Freq: Two times a day (BID) | ORAL | 0 refills | Status: DC

## 2018-04-01 MED ORDER — MAGNESIUM CITRATE PO SOLN
1.0000 | Freq: Once | ORAL | Status: AC
  Administered 2018-04-01: 1 via ORAL
  Filled 2018-04-01: qty 296

## 2018-04-01 MED ORDER — FLEET ENEMA 7-19 GM/118ML RE ENEM
1.0000 | ENEMA | Freq: Once | RECTAL | Status: DC
  Filled 2018-04-01: qty 1

## 2018-04-01 MED ORDER — HYDROMORPHONE HCL 1 MG/ML IJ SOLN
1.0000 mg | Freq: Once | INTRAMUSCULAR | Status: AC
  Administered 2018-04-01: 1 mg via INTRAVENOUS
  Filled 2018-04-01: qty 1

## 2018-04-01 MED ORDER — ONDANSETRON 4 MG PO TBDP: ORAL_TABLET | ORAL | 0 refills | Status: DC

## 2018-04-01 MED ORDER — MORPHINE SULFATE (PF) 4 MG/ML IV SOLN
4.0000 mg | Freq: Once | INTRAVENOUS | Status: AC
  Administered 2018-04-01: 4 mg via INTRAVENOUS
  Filled 2018-04-01: qty 1

## 2018-04-01 MED ORDER — PROMETHAZINE HCL 25 MG/ML IJ SOLN
25.0000 mg | Freq: Once | INTRAMUSCULAR | Status: AC
  Administered 2018-04-01: 25 mg via INTRAVENOUS
  Filled 2018-04-01: qty 1

## 2018-04-01 MED ORDER — TRAMADOL HCL 50 MG PO TABS: 50.0000 mg | ORAL_TABLET | Freq: Four times a day (QID) | ORAL | 0 refills | Status: DC | PRN

## 2018-04-01 NOTE — ED Provider Notes (Signed)
[facility]-EMERGENCY DEPT Provider Note   CSN: [id number] Arrival date & time: 04/01/18  1027     History   Chief Complaint Chief Complaint  Patient presents with  . Flank Pain  . Emesis    HPI [name] is a 42 y.o. female history of depression, diabetes, previous kidney stone who presented with left flank pain.  Patient states that she has intermittent left flank pain going on for the last several weeks.  She came to the ED recently and was recommended to have a CT abdomen pelvis but she wants to leave at that time.  About 5 days ago she saw her primary care doctor and had a CT abdomen pelvis that showed a left intrarenal stone with no hydro-at that time.  She states that her pain was controlled until yesterday around 11 AM.  She has sudden onset of sharp left-sided flank pain and associated with nausea and vomiting.  She states that the pain is constant and not relieving with Tylenol and Motrin.  She states that her blood sugar is also elevated around 400s.   The history is provided by the patient.    Past Medical History:  Diagnosis Date  . Cluster headache syndrome, intractable   . Depression   . Migraine without aura, with intractable migraine, so stated, without mention of status migrainosus 12/05/2012  . Ovarian cyst   . STD (sexually transmitted disease)    HSV II   . Type I diabetes mellitus [facility])     Patient Active Problem List   Diagnosis Date Noted  . DKA, type 1 (HCC) 02/14/2015  . DKA (diabetic ketoacidoses) (HCC) 02/14/2015  . Thyroid nodule 02/14/2015  . Depression with anxiety 02/14/2015  . Acute kidney injury (HCC) 02/14/2015  . Leukocytosis 02/14/2015  . Increased anion gap metabolic acidosis 02/14/2015  . Recurrent major depression-severe (HCC) 01/06/2013  . Generalized anxiety disorder 01/06/2013  . Common migraine with intractable migraine 12/05/2012  . IDDM (insulin dependent diabetes mellitus) (HCC) 12/05/2012    Past  Surgical History:  Procedure Laterality Date  . APPENDECTOMY    . BUNIONECTOMY Bilateral   . CHOLECYSTECTOMY    . OVARIAN CYST REMOVAL       OB History    Gravida  0   Para  0   Term  0   Preterm  0   AB  0   Living  0     SAB  0   TAB  0   Ectopic  0   Multiple  0   Live Births  0            Home Medications    Prior to Admission medications   Medication Sig Start Date End Date Taking? Authorizing Provider  amphetamine-dextroamphetamine (ADDERALL) 20 MG tablet Take 20 mg by mouth 3 (three) times daily.  10/24/17  Yes [provider]  Brexpiprazole (REXULTI) 1 MG TABS Take 1 tablet (1 mg total) by mouth daily. 11/18/17  Yes York Spaniel, MD  butalbital-acetaminophen-caffeine (FIORICET, ESGIC) [phone] MG tablet Take 1 tablet by mouth every 6 (six) hours as needed for headache. 11/18/17  Yes York Spaniel, MD  chlorproMAZINE (THORAZINE) 100 MG tablet Take 1 tablet (100 mg total) by mouth 2 (two) times daily. Patient taking differently: Take 100 mg by mouth 2 (two) times daily as needed (migraine).  12/13/17  Yes York Spaniel, MD  clonazePAM (KLONOPIN) 0.5 MG tablet Take 0.5 mg by mouth 3 (three)  times daily as needed for anxiety. For anxiety 01/09/13  Yes Thermon Leyland, NP  Fremanezumab-vfrm (AJOVY) 225 MG/1.5ML SOSY Inject 1 pen into the skin every 30 (thirty) days.   Yes [provider]  ibuprofen (ADVIL,MOTRIN) 200 MG tablet Take 400-600 mg by mouth every 6 (six) hours as needed for fever or mild pain.   Yes [provider]  Insulin Aspart, w/Niacinamide, (FIASP FLEXTOUCH) 100 UNIT/ML SOPN Inject 1-10 Units into the skin 3 (three) times daily with meals. Per sliding scale    Yes [provider]  insulin degludec (TRESIBA FLEXTOUCH) 100 UNIT/ML SOPN FlexTouch Pen Inject 20 Units into the skin at bedtime.    Yes [provider]  levonorgestrel (MIRENA) 20 MCG/24HR IUD 1 each by Intrauterine route once. Placed  06/2014   Yes [provider]  Multiple Vitamin (MULTIVITAMIN WITH MINERALS) TABS Take 1 tablet by mouth daily. 01/09/13  Yes Thermon Leyland, NP  ondansetron (ZOFRAN ODT) 4 MG disintegrating tablet 4mg  ODT q4 hours prn nausea/vomit Patient taking differently: Take 4 mg by mouth every 4 (four) hours as needed for nausea or vomiting.  03/21/18  Yes Rolan Bucco, MD  traZODone (DESYREL) 50 MG tablet Take 50 mg by mouth at bedtime as needed for sleep.  10/08/17  Yes [provider]  VIIBRYD 40 MG TABS Take 40 mg by mouth daily.  03/13/18  Yes [provider]  zolpidem (AMBIEN) 10 MG tablet Take 10 mg by mouth at bedtime as needed for sleep.    Yes [provider]  dexamethasone (DECADRON) 2 MG tablet Take 3 tablets the first day, 2 the second and 1 the third day Patient not taking: Reported on 01/05/2018 12/12/17   York Spaniel, MD  pantoprazole (PROTONIX) 20 MG tablet Take 1 tablet (20 mg total) by mouth daily. Patient not taking: Reported on 03/21/2018 01/05/18   Jacalyn Lefevre, MD  promethazine (PHENERGAN) 25 MG tablet Take 1 tablet (25 mg total) every 6 (six) hours as needed by mouth for nausea or vomiting. Patient not taking: Reported on 01/05/2018 05/14/17   Hedges, Tinnie Gens, PA-C  Vilazodone HCl (VIIBRYD) 20 MG TABS Take 1 tablet (20 mg total) by mouth daily. Patient not taking: Reported on 03/21/2018 11/18/17   York Spaniel, MD    Family History Family History  Adopted: Yes  Problem Relation Age of Onset  . Diabetes Father     Social History Social History   Tobacco Use  . Smoking status: Never Smoker  . Smokeless tobacco: Never Used  Substance Use Topics  . Alcohol use: Yes    Comment: rare  . Drug use: No     Allergies   Gluten meal; Sulfa antibiotics; Wellbutrin [bupropion]; Amoxicillin; and Codeine   Review of Systems Review of Systems  Gastrointestinal: Positive for vomiting.  Genitourinary: Positive for flank pain.  All other  systems reviewed and are negative.    Physical Exam Updated Vital Signs BP 105/64 (BP Location: Right Arm)   Pulse 85   Temp 97.8 F (36.6 C)   Resp 18   LMP 03/26/2018 (Exact Date)   SpO2 98%   Physical Exam  Constitutional: She is oriented to person, place, and time.  Uncomfortable   HENT:  Head: Normocephalic.  Mouth/Throat: Oropharynx is clear and moist.  Eyes: Pupils are equal, round, and reactive to light. Conjunctivae and EOM are normal.  Neck: Normal range of motion. Neck supple.  Cardiovascular: Normal rate, regular rhythm and normal heart sounds.  Pulmonary/Chest: Effort normal and breath sounds normal. No stridor. No respiratory distress. She has no wheezes.  Abdominal: Soft. Bowel sounds are normal. She exhibits no distension. There is no tenderness.  + L CVAT   Musculoskeletal: Normal range of motion.  Neurological: She is alert and oriented to person, place, and time. No cranial nerve deficit. Coordination normal.  Skin: Skin is warm. Capillary refill takes less than 2 seconds.  Psychiatric: She has a normal mood and affect.  Nursing note and vitals reviewed.    ED Treatments / Results  Labs (all labs ordered are listed, but only abnormal results are displayed) Labs Reviewed  COMPREHENSIVE METABOLIC PANEL - Abnormal; Notable for the following components:      Result Value   Glucose, Bld 236 (*)    AST 14 (*)    All other components within normal limits  URINALYSIS, ROUTINE W REFLEX MICROSCOPIC - Abnormal; Notable for the following components:   Color, Urine STRAW (*)    Glucose, UA 150 (*)    Hgb urine dipstick SMALL (*)    Bacteria, UA FEW (*)    All other components within normal limits  CBC WITH DIFFERENTIAL/PLATELET  LIPASE, BLOOD  BLOOD GAS, VENOUS  I-STAT BETA HCG BLOOD, ED ([facility], [facility], [facility] ONLY)    EKG None  Radiology Dg Abdomen 1 View  Result Date: 04/01/2018 CLINICAL DATA:  Left side abdominal pain.  History of kidney stones. EXAM:  ABDOMEN - 1 VIEW COMPARISON:  CT 05/14/2017 FINDINGS: Large stool burden throughout the colon. Oral contrast material is seen within the colon. Rounded calcifications in the pelvis likely reflect phleboliths. No visible suspicious calcification. No organomegaly or free air. IUD noted in the pelvis. IMPRESSION: Large stool burden throughout the colon. No definite visible suspicious calcification. Small stones could be obscured by stool and contrast within the colon. Electronically Signed   By: Charlett Nose M.D.   On: 04/01/2018 08:06   US Renal  Result Date: 04/01/2018 CLINICAL DATA:  Left flank pain. EXAM: RENAL / URINARY TRACT ULTRASOUND COMPLETE COMPARISON:  CT 05/14/2017. FINDINGS: Right Kidney: Length: 11.4 cm. Echogenicity within normal limits. No mass or hydronephrosis visualized. Tiny echogenic foci noted the renal pelvis. Nonobstructing renal stones cannot be excluded. Left Kidney: Length: 11.3 cm. Left renal cortical irregularity suggesting scarring echogenicity within normal limits. No mass or hydronephrosis visualized. Tiny echogenic foci noted the renal pelvis. Nonobstructing renal stones cannot be excluded. Bladder: Appears normal for degree of bladder distention. IMPRESSION: 1. Tiny echogenic foci noted the renal pelvis bilaterally. Tiny nonobstructing stones cannot be excluded. Left renal cortical irregularity suggesting scarring. 2.  No acute abnormality.  No hydronephrosis. Electronically Signed   By: Maisie Fus  Register   On: 04/01/2018 08:37    Procedures Procedures (including critical care time)  Medications Ordered in ED Medications  sodium chloride 0.9 % bolus 1,000 mL (0 mLs Intravenous Stopped 04/01/18 0823)  promethazine (PHENERGAN) injection 25 mg (25 mg Intravenous Given 04/01/18 0752)  morphine 4 MG/ML injection 4 mg (4 mg Intravenous Given 04/01/18 0752)  sodium chloride 0.9 % bolus 1,000 mL (1,000 mLs Intravenous New Bag/Given (Non-Interop) 04/01/18 0943)  magnesium citrate  solution 1 Bottle (1 Bottle Oral Given 04/01/18 0949)  HYDROmorphone (DILAUDID) injection 1 mg (1 mg Intravenous Given 04/01/18 0949)  HYDROmorphone (DILAUDID) injection 1 mg (1 mg Intravenous Given 04/01/18 1132)     Initial Impression / Assessment and Plan / ED Course  I have reviewed the triage vital signs and the nursing notes.  Pertinent labs & imaging results that were available during my care of the patient were reviewed by me and considered in my medical decision making (see chart for details).     [name] is a 42 y.o. female here with L flank pain. Likely renal colic vs pyelo vs DKA. Recent CT showed L intra renal stone with no hydro. Will get 1 view abdomen, Renal US, labs, UA. Will hydrate and give pain meds and reassess.   12:33 PM WBC nl. Glucose 236 with nl AG. Xray showed constipation. US showed no hydro. UA showed no UTI. I ordered enema initially but she refused it and she was given magnesium citrate but still had no bowel movement. I think pain likely from constipation. Recommend miralax twice daily, bentyl for pain, tramadol prn.   Final Clinical Impressions(s) / ED Diagnoses   Final diagnoses:  None    ED Discharge Orders    None       Charlynne Pander, MD 04/01/18 1234

## 2018-04-01 NOTE — ED Triage Notes (Signed)
Pt reports that she had kidney stone on CT scan and believes that she is trying to pass it. Been in lots of pain since around noon yesterday. Reports vomited twice this morning.

## 2018-04-01 NOTE — Discharge Instructions (Addendum)
Stay hydrated.   You are constipated. Take miralax twice daily until you have normal bowel movements for a week   Take bentyl for cramps. Take tramadol for severe pain   Take zofran for nausea.   See GI doctor for follow up if you have persistent pain for a colonoscopy   Return to ER if you have worse abdominal pain, vomiting, fever.

## 2018-04-01 NOTE — ED Notes (Signed)
PT. HAS BEEN INFORMED THAT UA IS NEEDED.

## 2018-04-01 NOTE — ED Notes (Signed)
Ultrasound at bedside

## 2018-04-01 NOTE — ED Notes (Signed)
Made Dee with Respiratory aware that VBG in mini lab to be ran.

## 2018-04-17 DIAGNOSIS — E042 Nontoxic multinodular goiter: Secondary | ICD-10-CM | POA: Insufficient documentation

## 2018-04-24 ENCOUNTER — Ambulatory Visit: Payer: BC Managed Care – PPO | Admitting: Neurology

## 2018-06-03 DIAGNOSIS — G44221 Chronic tension-type headache, intractable: Secondary | ICD-10-CM | POA: Insufficient documentation

## 2018-06-04 ENCOUNTER — Telehealth: Payer: Self-pay | Admitting: Neurology

## 2018-06-04 NOTE — Telephone Encounter (Signed)
Pt states she just got out the hospital for migraines and would like to know if she can come in for headache treatment and get something for nausea.  Please call

## 2018-06-04 NOTE — Telephone Encounter (Signed)
I called the patient.  The patient has a significant headache, we will get her in for Depacon injection with Phenergan 25 mg, she will have somebody driving her back.  She is to remain on the Ajovy, this has helped her headache.

## 2018-06-04 NOTE — Telephone Encounter (Signed)
Spoke with [name]. She sts. she has just been released from [facility] in [place], where she was admitted for depression. Sts. has had a h/a, nausea, vomiting for 3 wks; she believes brought on by depression, stress. Sts. while hospitalized, she was receiving Phenergan and Tramadol for  pain, nausea. She has also tried Thorazine for nausea and Fioricet for h/a, without relief. Would like to come in for IV for h/a. Sts. she has been compliant with Ajovy since July, is due for another injection next wk, and believes it helps; she again sts. she thinks h/a is due to increased stress, h/a.  I will pass this update along to Dr. Anne HahnWillis to see how he would like to proceed/fim

## 2018-06-16 ENCOUNTER — Ambulatory Visit (HOSPITAL_BASED_OUTPATIENT_CLINIC_OR_DEPARTMENT_OTHER): Payer: BC Managed Care – PPO | Admitting: Psychiatry

## 2018-06-16 ENCOUNTER — Other Ambulatory Visit: Payer: Self-pay

## 2018-06-16 ENCOUNTER — Encounter: Payer: Self-pay | Admitting: Psychiatry

## 2018-06-16 VITALS — BP 145/90 | HR 106 | Temp 99.2°F | Wt 159.0 lb

## 2018-06-16 DIAGNOSIS — F332 Major depressive disorder, recurrent severe without psychotic features: Secondary | ICD-10-CM

## 2018-06-16 NOTE — Progress Notes (Signed)
ECT: This is an ECT consult for this 42 year old woman referred by her outpatient psychiatrist, Dr.Kaur.  Patient seen chart reviewed.  Patient reports current symptoms of depressed mood most of the time.  Also irritable.  Little motivation.  Low energy constantly.  Frequently feels like doing nothing at all during the day.  Sleep is impaired without current medicine.  Patient denies any active suicidal intent or plan but admits to sometimes having passive wishes to be "gone".  She denies any psychotic symptoms.  Patient is not drinking or using any drugs.  She is taking prescribed psychiatric medicine from her regular outpatient psychiatrist and has a therapist she is seeing.  Patient was recently admitted to [facility] psychiatric ward and ECT was recommended at that time but patient did not stay in the hospital long enough to start inpatient treatment there.  Social history: Patient is married and lives with her husband.  No children at home.  She is employed but currently on work leave for about a month.  [employer] work extremely stressful  Medical history: Patient has diabetes and has an insulin pump.  Reports that sugars are reasonably well controlled on current regimen.  Substance abuse history: Alcohol use only occasional no history of alcohol abuse no other drug use.  Past psychiatric history: Patient reports that she has had depressive symptoms going back to about 2001.  Has been on multiple medications as well as mood stabilizers.  Currently taking a combination of gabapentin, Cymbalta, mirtazapine and trazodone with only occasional as needed Klonopin.  She does not have a history of previous suicide attempts.  Does have a history of hospitalization.  No known history of mania or psychotic symptoms.  Mental status exam: Patient is on time and cooperative.  Good historian.  Well dressed neatly groomed good eye contact.  Affect blunted and dysphoric.  Mood stated as being bad.  Thoughts are lucid  with no sign of delusional thinking or disorganization.  Denies hallucinations.  No sign of paranoia.  Affect is blunted and flat.  Patient denies any intention or plan to harm self but does have some passive thoughts of wishing to be "gone".  She shows good insight and judgment.  She is alert and oriented x4.  Appears to be fully cognitively intact with normal memory function.  She is capable of making appropriate medical decisions on her own.  Assessment: This is a 42 year old woman with a diagnosis of recurrent severe major depression without psychotic features.  Currently suffering from severe symptoms which have made it impossible for her to work and because severe limitations in her quality of life.  She has been on multiple medications and had therapy and had inpatient treatment all without regaining function.  By this set of criteria the patient is an appropriate candidate for electroconvulsive therapy.  Patient has no known medical conditions which would be a major hindrance or contraindication and her medication would not be a contraindication.  Patient was educated about ECT.  Details of the treatment plan were reviewed.  Patient was given opportunity to ask all the questions she wanted.  I recommended that we begin index course of ECT with right unilateral treatment proposed to begin on January 3 if possible.  Earlier treatment is not practical because of the holidays.  Patient understands the plan.  She was given orders for the usual preanesthesia work-up tests.  I will send an email to utilization review and the ECT nursing staff.  Patient was advised that she  does not need to make any changes to her medication but that she should not take clonazepam the night before or morning of ECT treatment.

## 2018-06-20 ENCOUNTER — Telehealth: Payer: Self-pay

## 2018-06-27 ENCOUNTER — Inpatient Hospital Stay: Admission: RE | Admit: 2018-06-27 | Payer: Self-pay | Source: Ambulatory Visit

## 2018-06-30 ENCOUNTER — Other Ambulatory Visit: Payer: Self-pay

## 2018-06-30 ENCOUNTER — Ambulatory Visit
Admission: RE | Admit: 2018-06-30 | Discharge: 2018-06-30 | Disposition: A | Discharge status: Home / Self Care | Payer: BC Managed Care – PPO | Source: Ambulatory Visit | Attending: Psychiatry | Admitting: Psychiatry

## 2018-06-30 ENCOUNTER — Encounter
Admission: RE | Admit: 2018-06-30 | Discharge: 2018-06-30 | Disposition: A | Discharge status: Home / Self Care | Payer: BC Managed Care – PPO | Source: Ambulatory Visit | Attending: Psychiatry | Admitting: Psychiatry

## 2018-06-30 DIAGNOSIS — Z01818 Encounter for other preprocedural examination: Secondary | ICD-10-CM | POA: Insufficient documentation

## 2018-06-30 HISTORY — DX: Encounter for adoption services: Z02.82

## 2018-06-30 HISTORY — DX: Other specified health status: Z78.9

## 2018-06-30 LAB — URINALYSIS, ROUTINE W REFLEX MICROSCOPIC
BACTERIA UA: NONE SEEN
BILIRUBIN URINE: NEGATIVE
Glucose, UA: >=500 mg/dL — AB
HGB URINE DIPSTICK: NEGATIVE
KETONES UR: NEGATIVE mg/dL
LEUKOCYTES UA: NEGATIVE
NITRITE: NEGATIVE
Protein, ur: NEGATIVE mg/dL
Specific Gravity, Urine: 1 — ABNORMAL LOW (ref 1.005–1.030)
pH: 7 (ref 5.0–8.0)

## 2018-06-30 LAB — BASIC METABOLIC PANEL
ANION GAP: 9 (ref 5–15)
BUN: 15 mg/dL (ref 6–20)
CHLORIDE: 100 mmol/L (ref 98–111)
CO2: 24 mmol/L (ref 22–32)
Calcium: 9.1 mg/dL (ref 8.9–10.3)
Creatinine, Ser: 0.74 mg/dL (ref 0.44–1.00)
GFR calc non Af Amer: >60 mL/min (ref >60)
Glucose, Bld: 276 mg/dL — ABNORMAL HIGH (ref 70–99)
POTASSIUM: 3.7 mmol/L (ref 3.5–5.1)
Sodium: 133 mmol/L — ABNORMAL LOW (ref 135–145)

## 2018-06-30 LAB — CBC
HEMATOCRIT: 44.3 % (ref 36.0–46.0)
HEMOGLOBIN: 14 g/dL (ref 12.0–15.0)
MCH: 29.1 pg (ref 26.0–34.0)
MCHC: 31.6 g/dL (ref 30.0–36.0)
MCV: 92.1 fL (ref 80.0–100.0)
NRBC: 0 % (ref 0.0–0.2)
Platelets: 384 10*3/uL (ref 150–400)
RBC: 4.81 MIL/uL (ref 3.87–5.11)
RDW: 12.5 % (ref 11.5–15.5)
WBC: 11.5 10*3/uL — AB (ref 4.0–10.5)

## 2018-07-03 ENCOUNTER — Other Ambulatory Visit: Payer: Self-pay | Admitting: Psychiatry

## 2018-07-04 ENCOUNTER — Encounter: Payer: Self-pay | Admitting: Anesthesiology

## 2018-07-04 ENCOUNTER — Encounter
Admission: RE | Admit: 2018-07-04 | Discharge: 2018-07-04 | Disposition: A | Discharge status: Home / Self Care | Payer: BC Managed Care – PPO | Source: Ambulatory Visit | Attending: Psychiatry | Admitting: Psychiatry

## 2018-07-04 DIAGNOSIS — E109 Type 1 diabetes mellitus without complications: Secondary | ICD-10-CM | POA: Insufficient documentation

## 2018-07-04 DIAGNOSIS — Z794 Long term (current) use of insulin: Secondary | ICD-10-CM | POA: Insufficient documentation

## 2018-07-04 DIAGNOSIS — Z885 Allergy status to narcotic agent status: Secondary | ICD-10-CM | POA: Diagnosis not present

## 2018-07-04 DIAGNOSIS — Z882 Allergy status to sulfonamides status: Secondary | ICD-10-CM | POA: Insufficient documentation

## 2018-07-04 DIAGNOSIS — F332 Major depressive disorder, recurrent severe without psychotic features: Secondary | ICD-10-CM | POA: Insufficient documentation

## 2018-07-04 LAB — GLUCOSE, CAPILLARY: Glucose-Capillary: 141 mg/dL — ABNORMAL HIGH (ref 70–99)

## 2018-07-04 LAB — POCT PREGNANCY, URINE: Preg Test, Ur: NEGATIVE

## 2018-07-04 MED ORDER — SUCCINYLCHOLINE CHLORIDE 20 MG/ML IJ SOLN
INTRAMUSCULAR | Status: DC | PRN
  Administered 2018-07-04: 90 mg via INTRAVENOUS

## 2018-07-04 MED ORDER — SODIUM CHLORIDE 0.9 % IV SOLN
INTRAVENOUS | Status: DC | PRN
  Administered 2018-07-04: 11:00:00 via INTRAVENOUS

## 2018-07-04 MED ORDER — METHOHEXITAL SODIUM 100 MG/10ML IV SOSY
PREFILLED_SYRINGE | INTRAVENOUS | Status: DC | PRN
  Administered 2018-07-04: 70 mg via INTRAVENOUS

## 2018-07-04 MED ORDER — SODIUM CHLORIDE 0.9 % IV SOLN
500.0000 mL | Freq: Once | INTRAVENOUS | Status: AC
  Administered 2018-07-04: 500 mL via INTRAVENOUS

## 2018-07-04 NOTE — Anesthesia Postprocedure Evaluation (Signed)
Anesthesia Post Note  Patient: KONNER SHORES  Procedure(s) Performed: ECT TX  Patient location during evaluation: PACU Anesthesia Type: General Level of consciousness: awake and alert Pain management: pain level controlled Vital Signs Assessment: post-procedure vital signs reviewed and stable Respiratory status: spontaneous breathing, nonlabored ventilation and respiratory function stable Cardiovascular status: blood pressure returned to baseline and stable Postop Assessment: no signs of nausea or vomiting Anesthetic complications: no     Last Vitals:  Vitals:   07/04/18 1135 07/04/18 1150  BP: 125/88   Pulse: 100 98  Resp: 16 (!) 98  Temp:  37.1 C  SpO2: 94%     Last Pain:  Vitals:   07/04/18 1150  TempSrc: Oral  PainSc: 0-No pain                 Latorya Bautch

## 2018-07-04 NOTE — Discharge Instructions (Signed)
1)  The drugs that you have been given will stay in your system until tomorrow so for the       next 24 hours you should not:  A. Drive an automobile  B. Make any legal decisions  C. Drink any alcoholic beverages  2)  You may resume your regular meals upon return home.  3)  A responsible adult must take you home.  Someone should stay with you for a few          hours, then be available by phone for the remainder of the treatment day.  4)  You May experience any of the following symptoms:  Headache, Nausea and a dry mouth (due to the medications you were given),  temporary memory loss and some confusion, or sore muscles (a warm bath  should help this).  If you you experience any of these symptoms let us know on                your return visit.  5)  Report any of the following: any acute discomfort, severe headache, or temperature        greater than 100.5 F.   Also report any unusual redness, swelling, drainage, or pain         at your IV site.    You may report Symptoms to:  ECT PROGRAM- [place] [facility] [place]          Phone: [phone], ECT Department           or Dr. Shary Key office [phone]  6)  Your next ECT Treatment is Monday January   We will call 2 days prior to your scheduled appointment for arrival times.  7)  Nothing to eat or drink after midnight the night before your procedure.  8)  Take ***     With a sip of water the morning of your procedure.  9)  Other Instructions: Call [phone] to cancel the morning of your procedure due         to illness or emergency.  10) We will call within 72 hours to assess how you are feeling.

## 2018-07-04 NOTE — Anesthesia Post-op Follow-up Note (Signed)
Anesthesia QCDR form completed.        

## 2018-07-04 NOTE — Transfer of Care (Signed)
Immediate Anesthesia Transfer of Care Note  Patient: [name]  Procedure(s) Performed: ECT TX  Patient Location: PACU  Anesthesia Type:General  Level of Consciousness: awake, alert  and oriented  Airway & Oxygen Therapy: Patient Spontanous Breathing and Patient connected to face mask oxygen  Post-op Assessment: Report given to RN and Post -op Vital signs reviewed and stable  Post vital signs: Reviewed and stable  Last Vitals:  Vitals Value Taken Time  BP 131/87 07/04/2018 11:05 AM  Temp 36.8 C 07/04/2018 11:05 AM  Pulse 99 07/04/2018 11:06 AM  Resp 21 07/04/2018 11:06 AM  SpO2 92 % 07/04/2018 11:06 AM  Vitals shown include unvalidated device data.  Last Pain:  Vitals:   07/04/18 0904  TempSrc: Oral  PainSc:          Complications: No apparent anesthesia complications

## 2018-07-04 NOTE — H&P (Signed)
[name] is an 43 y.o. female.   Chief Complaint: Patient with chronic severe depression has depressed mood but no psychotic or suicidal symptoms HPI: History of recurrent severe depression.  History of diabetes.  No previous ECT  Past Medical History:  Diagnosis Date  . Adopted   . Cluster headache syndrome, intractable   . Depression   . Migraine without aura, with intractable migraine, so stated, without mention of status migrainosus 12/05/2012  . Ovarian cyst   . STD (sexually transmitted disease)    HSV II   . Type I diabetes mellitus (HCC)     Past Surgical History:  Procedure Laterality Date  . APPENDECTOMY    . BUNIONECTOMY Bilateral   . CHOLECYSTECTOMY    . EYE SURGERY    . OVARIAN CYST REMOVAL      Family History  Adopted: Yes  Problem Relation Age of Onset  . Diabetes Father    Social History:  reports that she has never smoked. She has never used smokeless tobacco. She reports current alcohol use. She reports that she does not use drugs.  Allergies:  Allergies  Allergen Reactions  . Gluten Meal Other (See Comments)    abdominal cramps  . Sulfa Antibiotics Nausea And Vomiting  . Wellbutrin [Bupropion] Other (See Comments)    "Makes me suicidal."  . Amoxicillin Rash    Has patient had a PCN reaction causing immediate rash, facial/tongue/throat swelling, SOB or lightheadedness with hypotension: Yes Has patient had a PCN reaction causing severe rash involving mucus membranes or skin necrosis: No Has patient had a PCN reaction that required hospitalization: No Has patient had a PCN reaction occurring within the last 10 years: No If all of the above answers are "NO", then may proceed with Cephalosporin use.   . Codeine Rash    Cannot take just codeine. Able to tolerate derivatives such as hydrocodone    (Not in a hospital admission)   Results for orders placed or performed during the hospital encounter of 07/04/18 (from the past 48 hour(s))   Pregnancy, urine POC     Status: None   Collection Time: 07/04/18  8:50 AM  Result Value Ref Range   Preg Test, Ur NEGATIVE NEGATIVE    Comment:        THE SENSITIVITY OF THIS METHODOLOGY IS >24 mIU/mL    No results found.  Review of Systems  Constitutional: Negative.   HENT: Negative.   Eyes: Negative.   Respiratory: Negative.   Cardiovascular: Negative.   Gastrointestinal: Negative.   Musculoskeletal: Negative.   Skin: Negative.   Neurological: Negative.   Psychiatric/Behavioral: Positive for depression. Negative for hallucinations, memory loss, substance abuse and suicidal ideas. The patient is not nervous/anxious and does not have insomnia.     Blood pressure 120/87, pulse 87, temperature 98.8 F (37.1 C), temperature source Oral, resp. rate 16, height 5\' 1"  (1.549 m), weight 68 kg, SpO2 98 %. Physical Exam  Nursing note and vitals reviewed. Constitutional: She appears well-developed and well-nourished.  HENT:  Head: Normocephalic and atraumatic.  Eyes: Pupils are equal, round, and reactive to light. Conjunctivae are normal.  Neck: Normal range of motion.  Cardiovascular: Regular rhythm and normal heart sounds.  Respiratory: Effort normal.  GI: Soft.  Musculoskeletal: Normal range of motion.  Neurological: She is alert.  Skin: Skin is warm and dry.  Psychiatric: Judgment normal. Her affect is blunt. Her speech is delayed. She is slowed. Cognition and memory are normal. She exhibits a  depressed mood. She expresses no suicidal ideation.     Assessment/Plan Treatment today right unilateral and continue with the beginning of an index course of ECT  Mordecai Rasmussen, MD 07/04/2018, 9:59 AM

## 2018-07-04 NOTE — Anesthesia Preprocedure Evaluation (Signed)
Anesthesia Evaluation  Patient identified by MRN, date of birth, ID band Patient awake    Reviewed: Allergy & Precautions, NPO status , Patient's Chart, lab work & pertinent test results  History of Anesthesia Complications Negative for: history of anesthetic complications  Airway Mallampati: II  TM Distance: >3 FB Neck ROM: Full    Dental no notable dental hx.    Pulmonary neg pulmonary ROS, neg sleep apnea, neg COPD,    breath sounds clear to auscultation- rhonchi (-) wheezing      Cardiovascular Exercise Tolerance: Good (-) hypertension(-) CAD, (-) Past MI, (-) Cardiac Stents and (-) CABG  Rhythm:Regular Rate:Normal - Systolic murmurs and - Diastolic murmurs    Neuro/Psych  Headaches, neg Seizures PSYCHIATRIC DISORDERS Anxiety Depression    GI/Hepatic negative GI ROS, Neg liver ROS,   Endo/Other  diabetes, Type 1, Insulin Dependent  Renal/GU negative Renal ROS     Musculoskeletal negative musculoskeletal ROS (+)   Abdominal (+) - obese,   Peds  Hematology negative hematology ROS (+)   Anesthesia Other Findings Past Medical History: No date: Adopted No date: Cluster headache syndrome, intractable No date: Depression 12/05/2012: Migraine without aura, with intractable migraine, so stated,  without mention of status migrainosus No date: Ovarian cyst No date: 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  No date: Type I diabetes mellitus (HCC)   Reproductive/Obstetrics                             Anesthesia Physical Anesthesia Plan  ASA: II  Anesthesia Plan: General   Post-op Pain Management:    Induction: Intravenous  PONV Risk Score and Plan: 2 and Ondansetron  Airway Management Planned: Mask  Additional Equipment:   Intra-op Plan:   Post-operative Plan:   Informed Consent: I have reviewed the patients History and Physical, chart, labs and discussed the  procedure including the risks, benefits and alternatives for the proposed anesthesia with the patient or authorized representative who has indicated his/her understanding and acceptance.   Dental advisory given  Plan Discussed with: CRNA and Anesthesiologist  Anesthesia Plan Comments:         Anesthesia Quick Evaluation

## 2018-07-04 NOTE — Anesthesia Procedure Notes (Signed)
Procedure Name: General with mask airway Performed by: Luane Rochon, Anne Ng, CRNA Pre-anesthesia Checklist: Patient identified, Emergency Drugs available, Suction available and Patient being monitored Patient Re-evaluated:Patient Re-evaluated prior to induction Oxygen Delivery Method: Circle system utilized Preoxygenation: Pre-oxygenation with 100% oxygen Induction Type: IV induction Ventilation: Mask ventilation without difficulty Airway Equipment and Method: Bite block Dental Injury: Teeth and Oropharynx as per pre-operative assessment

## 2018-07-04 NOTE — Procedures (Signed)
ECT SERVICES Physician's Interval Evaluation & Treatment Note  Patient Identification: [name] MRN:  [id number] Date of Evaluation:  07/04/2018 TX #: 1  MADRS:   MMSE:   P.E. Findings:  Physical unremarkable vitals normal heart and lung.  Psychiatric Interval Note:  Patient with chronic severe depression no psychosis no active suicidal ideation  Subjective:  Patient is a 43 y.o. female seen for evaluation for Electroconvulsive Therapy. Still feeling very depressed  Treatment Summary:   [x]   Right Unilateral             []  Bilateral   % Energy : 0.3 ms 40%   Impedance: 1960 ohms  Seizure Energy Index: 6044 V squared  Postictal Suppression Index: 57%  Seizure Concordance Index: 98%  Medications  Pre Shock: Brevital 70 mg succinylcholine 90 mg  Post Shock:    Seizure Duration: 47 seconds by foot movement 59 seconds by EEG   Comments: First treatment seems to have gone well we will see her back next week and to continue the index course  Lungs:  [x]   Clear to auscultation               []  Other:   Heart:    [x]   Regular rhythm             []  irregular rhythm    [x]   Previous H&P reviewed, patient examined and there are NO CHANGES                 []   Previous H&P reviewed, patient examined and there are changes noted.   Mordecai Rasmussen, MD 1/3/202010:51 AM

## 2018-07-06 ENCOUNTER — Other Ambulatory Visit: Payer: Self-pay | Admitting: Psychiatry

## 2018-07-07 ENCOUNTER — Telehealth: Payer: Self-pay | Admitting: Neurology

## 2018-07-07 ENCOUNTER — Telehealth: Payer: Self-pay

## 2018-07-07 ENCOUNTER — Telehealth (HOSPITAL_COMMUNITY): Payer: Self-pay | Admitting: *Deleted

## 2018-07-07 ENCOUNTER — Ambulatory Visit: Payer: BC Managed Care – PPO | Admitting: Neurology

## 2018-07-07 NOTE — Telephone Encounter (Signed)
Called for prior authorization for ECT on 07/04/17 was on hold for 2.5 hours. Called back today spoke with Enrique Sack who states to fax paperwork to [facility] at [phone]. Paperwork faxed and received confirmation.

## 2018-07-07 NOTE — Telephone Encounter (Signed)
This patient was 20 minutes late for her revisit appointment today.

## 2018-07-08 ENCOUNTER — Encounter: Payer: Self-pay | Admitting: Neurology

## 2018-07-08 ENCOUNTER — Other Ambulatory Visit: Payer: Self-pay | Admitting: Psychiatry

## 2018-07-09 ENCOUNTER — Encounter: Payer: Self-pay | Admitting: Anesthesiology

## 2018-07-09 ENCOUNTER — Encounter
Admission: RE | Admit: 2018-07-09 | Discharge: 2018-07-09 | Disposition: A | Discharge status: Home / Self Care | Payer: BC Managed Care – PPO | Source: Ambulatory Visit | Attending: Psychiatry | Admitting: Psychiatry

## 2018-07-09 DIAGNOSIS — Z888 Allergy status to other drugs, medicaments and biological substances status: Secondary | ICD-10-CM | POA: Insufficient documentation

## 2018-07-09 DIAGNOSIS — Z794 Long term (current) use of insulin: Secondary | ICD-10-CM | POA: Insufficient documentation

## 2018-07-09 DIAGNOSIS — F332 Major depressive disorder, recurrent severe without psychotic features: Secondary | ICD-10-CM | POA: Diagnosis not present

## 2018-07-09 DIAGNOSIS — Z833 Family history of diabetes mellitus: Secondary | ICD-10-CM | POA: Insufficient documentation

## 2018-07-09 DIAGNOSIS — Z882 Allergy status to sulfonamides status: Secondary | ICD-10-CM | POA: Diagnosis not present

## 2018-07-09 DIAGNOSIS — Z880 Allergy status to penicillin: Secondary | ICD-10-CM | POA: Diagnosis not present

## 2018-07-09 DIAGNOSIS — Z91018 Allergy to other foods: Secondary | ICD-10-CM | POA: Insufficient documentation

## 2018-07-09 DIAGNOSIS — Z9049 Acquired absence of other specified parts of digestive tract: Secondary | ICD-10-CM | POA: Diagnosis not present

## 2018-07-09 DIAGNOSIS — Z885 Allergy status to narcotic agent status: Secondary | ICD-10-CM | POA: Diagnosis not present

## 2018-07-09 DIAGNOSIS — F339 Major depressive disorder, recurrent, unspecified: Secondary | ICD-10-CM | POA: Diagnosis not present

## 2018-07-09 LAB — POCT PREGNANCY, URINE: Preg Test, Ur: NEGATIVE

## 2018-07-09 LAB — GLUCOSE, CAPILLARY
Glucose-Capillary: 148 mg/dL — ABNORMAL HIGH (ref 70–99)
Glucose-Capillary: 158 mg/dL — ABNORMAL HIGH (ref 70–99)

## 2018-07-09 MED ORDER — SODIUM CHLORIDE 0.9 % IV SOLN
INTRAVENOUS | Status: DC | PRN
  Administered 2018-07-09: 10:00:00 via INTRAVENOUS

## 2018-07-09 MED ORDER — ONDANSETRON HCL 4 MG/2ML IJ SOLN: 4.0000 mg | Freq: Once | INTRAMUSCULAR | Status: DC

## 2018-07-09 MED ORDER — SUCCINYLCHOLINE CHLORIDE 20 MG/ML IJ SOLN
INTRAMUSCULAR | Status: DC | PRN
  Administered 2018-07-09: 90 mg via INTRAVENOUS

## 2018-07-09 MED ORDER — METHOHEXITAL SODIUM 100 MG/10ML IV SOSY
PREFILLED_SYRINGE | INTRAVENOUS | Status: DC | PRN
  Administered 2018-07-09: 70 mg via INTRAVENOUS

## 2018-07-09 MED ORDER — KETOROLAC TROMETHAMINE 30 MG/ML IJ SOLN
30.0000 mg | Freq: Once | INTRAMUSCULAR | Status: AC
  Administered 2018-07-09: 30 mg via INTRAVENOUS

## 2018-07-09 MED ORDER — ONDANSETRON HCL 4 MG/2ML IJ SOLN
INTRAMUSCULAR | Status: AC
  Administered 2018-07-09: 4 mg
  Filled 2018-07-09: qty 2

## 2018-07-09 MED ORDER — KETOROLAC TROMETHAMINE 30 MG/ML IJ SOLN
INTRAMUSCULAR | Status: AC
  Filled 2018-07-09: qty 1

## 2018-07-09 MED ORDER — SODIUM CHLORIDE 0.9 % IV SOLN
500.0000 mL | Freq: Once | INTRAVENOUS | Status: AC
  Administered 2018-07-09: 500 mL via INTRAVENOUS

## 2018-07-09 NOTE — Anesthesia Post-op Follow-up Note (Signed)
Anesthesia QCDR form completed.        

## 2018-07-09 NOTE — H&P (Signed)
[name] is an 43 y.o. female.   Chief Complaint: No specific complaint other than chronic depression HPI: Severe recurrent depression beginning index course had first treatment and then missed 1.  Continuing index course  Past Medical History:  Diagnosis Date  . Adopted   . Cluster headache syndrome, intractable   . Depression   . Migraine without aura, with intractable migraine, so stated, without mention of status migrainosus 12/05/2012  . Ovarian cyst   . STD (sexually transmitted disease)    HSV II   . Type I diabetes mellitus (HCC)     Past Surgical History:  Procedure Laterality Date  . APPENDECTOMY    . BUNIONECTOMY Bilateral   . CHOLECYSTECTOMY    . EYE SURGERY    . OVARIAN CYST REMOVAL      Family History  Adopted: Yes  Problem Relation Age of Onset  . Diabetes Father    Social History:  reports that she has never smoked. She has never used smokeless tobacco. She reports current alcohol use. She reports that she does not use drugs.  Allergies:  Allergies  Allergen Reactions  . Gluten Meal Other (See Comments)    abdominal cramps  . Sulfa Antibiotics Nausea And Vomiting  . Wellbutrin [Bupropion] Other (See Comments)    "Makes me suicidal."  . Amoxicillin Rash    Has patient had a PCN reaction causing immediate rash, facial/tongue/throat swelling, SOB or lightheadedness with hypotension: Yes Has patient had a PCN reaction causing severe rash involving mucus membranes or skin necrosis: No Has patient had a PCN reaction that required hospitalization: No Has patient had a PCN reaction occurring within the last 10 years: No If all of the above answers are "NO", then may proceed with Cephalosporin use.   . Codeine Rash    Cannot take just codeine. Able to tolerate derivatives such as hydrocodone    (Not in a hospital admission)   No results found for this or any previous visit (from the past 48 hour(s)). No results found.  Review of Systems   Constitutional: Negative.   HENT: Negative.   Eyes: Negative.   Respiratory: Negative.   Cardiovascular: Negative.   Gastrointestinal: Negative.   Musculoskeletal: Negative.   Skin: Negative.   Neurological: Negative.   Psychiatric/Behavioral: Positive for depression. Negative for hallucinations, memory loss, substance abuse and suicidal ideas. The patient is nervous/anxious. The patient does not have insomnia.     Blood pressure (!) 134/92, pulse (!) 105, temperature 99.1 F (37.3 C), temperature source Oral, resp. rate 16, height 5\' 1"  (1.549 m), weight 68.3 kg, SpO2 98 %. Physical Exam  Nursing note and vitals reviewed. Constitutional: She appears well-developed and well-nourished.  HENT:  Head: Normocephalic and atraumatic.  Eyes: Pupils are equal, round, and reactive to light. Conjunctivae are normal.  Neck: Normal range of motion.  Cardiovascular: Regular rhythm and normal heart sounds.  Respiratory: Effort normal.  GI: Soft.  Musculoskeletal: Normal range of motion.  Neurological: She is alert.  Skin: Skin is warm and dry.  Psychiatric: Judgment normal. Her affect is blunt. Her speech is delayed. She is slowed. Cognition and memory are normal. She expresses no suicidal ideation.     Assessment/Plan Follow-up with index course treatment 3 times a week as scheduled as long as patient is agreeable  Mordecai Rasmussen, MD 07/09/2018, 9:11 AM

## 2018-07-09 NOTE — Anesthesia Procedure Notes (Signed)
Procedure Name: General with mask airway Date/Time: 07/09/2018 10:22 AM Performed by: Irving Burton, CRNA Pre-anesthesia Checklist: Patient identified, Emergency Drugs available, Suction available and Patient being monitored Patient Re-evaluated:Patient Re-evaluated prior to induction Oxygen Delivery Method: Circle system utilized Preoxygenation: Pre-oxygenation with 100% oxygen Induction Type: IV induction Ventilation: Mask ventilation without difficulty

## 2018-07-09 NOTE — Discharge Instructions (Signed)
1)  The drugs that you have been given will stay in your system until tomorrow so for the       next 24 hours you should not:  A. Drive an automobile  B. Make any legal decisions  C. Drink any alcoholic beverages  2)  You may resume your regular meals upon return home.  3)  A responsible adult must take you home.  Someone should stay with you for a few          hours, then be available by phone for the remainder of the treatment day.  4)  You May experience any of the following symptoms:  Headache, Nausea and a dry mouth (due to the medications you were given),  temporary memory loss and some confusion, or sore muscles (a warm bath  should help this).  If you you experience any of these symptoms let us know on                your return visit.  5)  Report any of the following: any acute discomfort, severe headache, or temperature        greater than 100.5 F.   Also report any unusual redness, swelling, drainage, or pain         at your IV site.    You may report Symptoms to:  ECT PROGRAM- [place] [facility]          Phone: [phone], ECT Department           or Dr. Shary Key office [phone]  6)  Your next ECT Treatment is Friday January 10 at 8:45  We will call 2 days prior to your scheduled appointment for arrival times.  7)  Nothing to eat or drink after midnight the night before your procedure.  8)  Take      With a sip of water the morning of your procedure.  9)  Other Instructions: Call [phone] to cancel the morning of your procedure due         to illness or emergency.  10) We will call within 72 hours to assess how you are feeling.

## 2018-07-09 NOTE — Anesthesia Postprocedure Evaluation (Signed)
Anesthesia Post Note  Patient: [name]  Procedure(s) Performed: ECT TX  Patient location during evaluation: PACU Anesthesia Type: General Level of consciousness: awake and alert Pain management: pain level controlled Vital Signs Assessment: post-procedure vital signs reviewed and stable Respiratory status: spontaneous breathing, nonlabored ventilation, respiratory function stable and patient connected to nasal cannula oxygen Cardiovascular status: blood pressure returned to baseline and stable Postop Assessment: no apparent nausea or vomiting Anesthetic complications: no     Last Vitals:  Vitals:   07/09/18 1115 07/09/18 1123  BP: 139/79 (!) 145/91  Pulse: 94 98  Resp: 15 16  Temp: (!) 36.2 C   SpO2: 96%     Last Pain:  Vitals:   07/09/18 1123  TempSrc:   PainSc: 0-No pain                 Cleda Mccreedy Piscitello

## 2018-07-09 NOTE — Transfer of Care (Signed)
Immediate Anesthesia Transfer of Care Note  Patient: [name]  Procedure(s) Performed: ECT TX  Patient Location: PACU  Anesthesia Type:General  Level of Consciousness: sedated  Airway & Oxygen Therapy: Patient connected to face mask oxygen  Post-op Assessment: Post -op Vital signs reviewed and stable  Post vital signs: stable  Last Vitals:  Vitals Value Taken Time  BP 133/78 07/09/2018 10:30 AM  Temp 36.2 C 07/09/2018 10:30 AM  Pulse 102 07/09/2018 10:30 AM  Resp 18 07/09/2018 10:30 AM  SpO2 96 % 07/09/2018 10:30 AM  Vitals shown include unvalidated device data.  Last Pain:  Vitals:   07/09/18 1030  TempSrc: Temporal  PainSc: (P) 0-No pain         Complications: No apparent anesthesia complications

## 2018-07-09 NOTE — Procedures (Signed)
ECT SERVICES Physician's Interval Evaluation & Treatment Note  Patient Identification: [name] MRN:  [id number] Date of Evaluation:  07/09/2018 TX #: 2  MADRS:   MMSE:   P.E. Findings:  No change in physical exam  Psychiatric Interval Note:  Mood still depressed but not much different  Subjective:  Patient is a 43 y.o. female seen for evaluation for Electroconvulsive Therapy. No specific new complaint  Treatment Summary:   [x]   Right Unilateral             []  Bilateral   % Energy : 0.3 ms 40%   Impedance: 2600 ohms  Seizure Energy Index: 7196 V squared  Postictal Suppression Index: 65%  Seizure Concordance Index: 90%  Medications  Pre Shock: Toradol 30 mg Brevital 70 mg succinylcholine 90 mg  Post Shock:    Seizure Duration: 48 seconds EMG 87 seconds EEG   Comments:  We will add 0.2 of Robinul  Lungs:  [x]   Clear to auscultation               []  Other:   Heart:    [x]   Regular rhythm             []  irregular rhythm    [x]   Previous H&P reviewed, patient examined and there are NO CHANGES                 []   Previous H&P reviewed, patient examined and there are changes noted.   Mordecai Rasmussen, MD 1/8/202010:15 AM

## 2018-07-09 NOTE — Anesthesia Preprocedure Evaluation (Signed)
Anesthesia Evaluation  Patient identified by MRN, date of birth, ID band Patient awake    Reviewed: Allergy & Precautions, NPO status , Patient's Chart, lab work & pertinent test results  History of Anesthesia Complications Negative for: history of anesthetic complications  Airway Mallampati: II  TM Distance: >3 FB Neck ROM: Full    Dental no notable dental hx. (+) Chipped   Pulmonary neg pulmonary ROS, neg sleep apnea, neg COPD,    breath sounds clear to auscultation- rhonchi (-) wheezing      Cardiovascular Exercise Tolerance: Good (-) hypertension(-) CAD, (-) Past MI, (-) Cardiac Stents and (-) CABG  Rhythm:Regular Rate:Normal - Systolic murmurs and - Diastolic murmurs    Neuro/Psych  Headaches, neg Seizures PSYCHIATRIC DISORDERS Anxiety Depression    GI/Hepatic negative GI ROS, Neg liver ROS,   Endo/Other  diabetes, Type 1, Insulin Dependent  Renal/GU Renal disease     Musculoskeletal negative musculoskeletal ROS (+)   Abdominal (+) - obese,   Peds  Hematology negative hematology ROS (+)   Anesthesia Other Findings Past Medical History: No date: Adopted No date: Cluster headache syndrome, intractable No date: Depression 12/05/2012: Migraine without aura, with intractable migraine, so stated,  without mention of status migrainosus No date: Ovarian cyst No date: 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  No date: Type I diabetes mellitus (HCC)   Reproductive/Obstetrics                             Anesthesia Physical  Anesthesia Plan  ASA: III  Anesthesia Plan: General   Post-op Pain Management:    Induction: Intravenous  PONV Risk Score and Plan: 2  Airway Management Planned: Mask  Additional Equipment:   Intra-op Plan:   Post-operative Plan:   Informed Consent: I have reviewed the patients History and Physical, chart, labs and discussed the procedure  including the risks, benefits and alternatives for the proposed anesthesia with the patient or authorized representative who has indicated his/her understanding and acceptance.     Dental advisory given  Plan Discussed with: CRNA and Anesthesiologist  Anesthesia Plan Comments: (Patient consented for risks of anesthesia including but not limited to:  - adverse reactions to medications - risk of intubation if required - damage to teeth, lips or other oral mucosa - sore throat or hoarseness - Damage to heart, brain, lungs or loss of life  Patient voiced understanding.)        Anesthesia Quick Evaluation  

## 2018-07-11 ENCOUNTER — Encounter: Payer: Self-pay | Admitting: Anesthesiology

## 2018-07-11 ENCOUNTER — Ambulatory Visit
Admission: RE | Admit: 2018-07-11 | Discharge: 2018-07-11 | Disposition: A | Discharge status: Home / Self Care | Payer: BC Managed Care – PPO | Source: Ambulatory Visit | Attending: Psychiatry | Admitting: Psychiatry

## 2018-07-11 ENCOUNTER — Other Ambulatory Visit: Payer: Self-pay | Admitting: Psychiatry

## 2018-07-11 DIAGNOSIS — F332 Major depressive disorder, recurrent severe without psychotic features: Secondary | ICD-10-CM | POA: Insufficient documentation

## 2018-07-11 DIAGNOSIS — E109 Type 1 diabetes mellitus without complications: Secondary | ICD-10-CM | POA: Insufficient documentation

## 2018-07-11 MED ORDER — SUCCINYLCHOLINE CHLORIDE 20 MG/ML IJ SOLN
INTRAMUSCULAR | Status: DC | PRN
  Administered 2018-07-11: 100 mg via INTRAVENOUS

## 2018-07-11 MED ORDER — SODIUM CHLORIDE 0.9 % IV SOLN
500.0000 mL | Freq: Once | INTRAVENOUS | Status: AC
  Administered 2018-07-11: 500 mL via INTRAVENOUS

## 2018-07-11 MED ORDER — SODIUM CHLORIDE 0.9 % IV SOLN
INTRAVENOUS | Status: DC | PRN
  Administered 2018-07-11: 11:00:00 via INTRAVENOUS

## 2018-07-11 MED ORDER — ONDANSETRON HCL 4 MG/2ML IJ SOLN: 4.0000 mg | Freq: Once | INTRAMUSCULAR | Status: DC | PRN

## 2018-07-11 MED ORDER — GLYCOPYRROLATE 0.2 MG/ML IJ SOLN
0.2000 mg | Freq: Once | INTRAMUSCULAR | Status: AC
  Administered 2018-07-11: 0.2 mg via INTRAVENOUS

## 2018-07-11 MED ORDER — SUCCINYLCHOLINE CHLORIDE 20 MG/ML IJ SOLN
INTRAMUSCULAR | Status: AC
  Filled 2018-07-11: qty 1

## 2018-07-11 MED ORDER — METHOHEXITAL SODIUM 100 MG/10ML IV SOSY
PREFILLED_SYRINGE | INTRAVENOUS | Status: DC | PRN
  Administered 2018-07-11: 70 mg via INTRAVENOUS

## 2018-07-11 MED ORDER — KETOROLAC TROMETHAMINE 30 MG/ML IJ SOLN
30.0000 mg | Freq: Once | INTRAMUSCULAR | Status: AC
  Administered 2018-07-11: 30 mg via INTRAVENOUS

## 2018-07-11 MED ORDER — SCOPOLAMINE 1 MG/3DAYS TD PT72
1.0000 | MEDICATED_PATCH | TRANSDERMAL | Status: DC
  Administered 2018-07-11: 1.5 mg via TRANSDERMAL
  Filled 2018-07-11: qty 1

## 2018-07-11 MED ORDER — KETOROLAC TROMETHAMINE 30 MG/ML IJ SOLN
INTRAMUSCULAR | Status: AC
  Filled 2018-07-11: qty 1

## 2018-07-11 MED ORDER — GLYCOPYRROLATE 0.2 MG/ML IJ SOLN
INTRAMUSCULAR | Status: AC
  Filled 2018-07-11: qty 1

## 2018-07-11 NOTE — Procedures (Signed)
ECT SERVICES Physician's Interval Evaluation & Treatment Note  Patient Identification: [name] MRN:  [id number] Date of Evaluation:  07/11/2018 TX #: 3  MADRS:   MMSE:   P.E. Findings:  No change in physical exam  Psychiatric Interval Note:  Stable depressed not suicidal  Subjective:  Patient is a 43 y.o. female seen for evaluation for Electroconvulsive Therapy. Not suicidal but still depressed no real change  Treatment Summary:   [x]   Right Unilateral             []  Bilateral   % Energy : 0.3 ms 40%   Impedance: 2350 ohms  Seizure Energy Index: 5430 V squared  Postictal Suppression Index: 55%  Seizure Concordance Index: 95%  Medications  Pre Shock: Robinul 0.2 mg Toradol 30 mg Brevital 70 mg succinylcholine 90 mg  Post Shock:    Seizure Duration: 60 seconds EMG 128 seconds EEG   Comments: Follow-up on Monday  Lungs:  [x]   Clear to auscultation               []  Other:   Heart:    [x]   Regular rhythm             []  irregular rhythm    [x]   Previous H&P reviewed, patient examined and there are NO CHANGES                 []   Previous H&P reviewed, patient examined and there are changes noted.   Mordecai Rasmussen, MD 1/10/202011:30 AM

## 2018-07-11 NOTE — Anesthesia Postprocedure Evaluation (Signed)
Anesthesia Post Note  Patient: [name]  Procedure(s) Performed: ECT TX  Patient location during evaluation: PACU Anesthesia Type: General Level of consciousness: awake and alert Pain management: pain level controlled Vital Signs Assessment: post-procedure vital signs reviewed and stable Respiratory status: spontaneous breathing and respiratory function stable Cardiovascular status: stable Anesthetic complications: no     Last Vitals:  Vitals:   07/11/18 0945 07/11/18 1136  BP: 131/75 115/82  Pulse: (!) 1 (!) 106  Resp: 18 15  Temp: 37 C (!) (P) 36.4 C  SpO2: 97% 98%    Last Pain:  Vitals:   07/11/18 1136  TempSrc:   PainSc: (P) Asleep                 KEPHART,WILLIAM K

## 2018-07-11 NOTE — H&P (Signed)
[name] is an 43 y.o. female.   Chief Complaint: Patient has no new complaint continues to have chronic depression HPI: History of severe recurrent depression  Past Medical History:  Diagnosis Date  . Adopted   . Cluster headache syndrome, intractable   . Depression   . Migraine without aura, with intractable migraine, so stated, without mention of status migrainosus 12/05/2012  . Ovarian cyst   . STD (sexually transmitted disease)    HSV II   . Type I diabetes mellitus (HCC)     Past Surgical History:  Procedure Laterality Date  . APPENDECTOMY    . BUNIONECTOMY Bilateral   . CHOLECYSTECTOMY    . EYE SURGERY    . OVARIAN CYST REMOVAL      Family History  Adopted: Yes  Problem Relation Age of Onset  . Diabetes Father    Social History:  reports that she has never smoked. She has never used smokeless tobacco. She reports current alcohol use. She reports that she does not use drugs.  Allergies:  Allergies  Allergen Reactions  . Gluten Meal Other (See Comments)    abdominal cramps  . Sulfa Antibiotics Nausea And Vomiting  . Wellbutrin [Bupropion] Other (See Comments)    "Makes me suicidal."  . Amoxicillin Rash    Has patient had a PCN reaction causing immediate rash, facial/tongue/throat swelling, SOB or lightheadedness with hypotension: Yes Has patient had a PCN reaction causing severe rash involving mucus membranes or skin necrosis: No Has patient had a PCN reaction that required hospitalization: No Has patient had a PCN reaction occurring within the last 10 years: No If all of the above answers are "NO", then may proceed with Cephalosporin use.   . Codeine Rash    Cannot take just codeine. Able to tolerate derivatives such as hydrocodone    (Not in a hospital admission)   Results for orders placed or performed during the hospital encounter of 07/09/18 (from the past 48 hour(s))  Pregnancy, urine POC     Status: None   Collection Time: 07/09/18  3:06 PM   Result Value Ref Range   Preg Test, Ur NEGATIVE NEGATIVE    Comment:        THE SENSITIVITY OF THIS METHODOLOGY IS >24 mIU/mL    No results found.  Review of Systems  Constitutional: Negative.   HENT: Negative.   Eyes: Negative.   Respiratory: Negative.   Cardiovascular: Negative.   Gastrointestinal: Positive for nausea.  Musculoskeletal: Negative.   Skin: Negative.   Neurological: Negative.   Psychiatric/Behavioral: Positive for depression. Negative for hallucinations, memory loss, substance abuse and suicidal ideas. The patient is not nervous/anxious and does not have insomnia.     Blood pressure 131/75, pulse (!) 1, temperature 98.6 F (37 C), temperature source Oral, resp. rate 18, height 5\' 1"  (1.549 m), SpO2 97 %. Physical Exam  Nursing note and vitals reviewed. Constitutional: She appears well-developed and well-nourished.  HENT:  Head: Normocephalic and atraumatic.  Eyes: Pupils are equal, round, and reactive to light. Conjunctivae are normal.  Neck: Normal range of motion.  Cardiovascular: Regular rhythm and normal heart sounds.  Respiratory: Effort normal.  GI: Soft.  Musculoskeletal: Normal range of motion.  Neurological: She is alert.  Skin: Skin is warm and dry.  Psychiatric: Judgment normal. Her affect is blunt. Her speech is delayed. She is slowed. Cognition and memory are normal. She expresses no suicidal ideation.     Assessment/Plan Physically stable.  Patient was given  a scopolamine patch by anesthesia today because of prior nausea.  Otherwise no change to overall treatment plan  Mordecai Rasmussen, MD 07/11/2018, 11:34 AM

## 2018-07-11 NOTE — Transfer of Care (Signed)
Immediate Anesthesia Transfer of Care Note  Patient: [name]  Procedure(s) Performed: ECT TX  Patient Location: PACU  Anesthesia Type:General  Level of Consciousness: drowsy and patient cooperative  Airway & Oxygen Therapy: Patient Spontanous Breathing and Patient connected to face mask oxygen  Post-op Assessment: Report given to RN and Post -op Vital signs reviewed and stable  Post vital signs: Reviewed and stable  Last Vitals:  Vitals Value Taken Time  BP 115/82 07/11/2018 11:36 AM  Temp    Pulse 105 07/11/2018 11:37 AM  Resp 14 07/11/2018 11:37 AM  SpO2 98 % 07/11/2018 11:37 AM  Vitals shown include unvalidated device data.  Last Pain:  Vitals:   07/11/18 0949  TempSrc:   PainSc: 0-No pain         Complications: No apparent anesthesia complications

## 2018-07-11 NOTE — Discharge Instructions (Signed)
1)  The drugs that you have been given will stay in your system until tomorrow so for the       next 24 hours you should not:  A. Drive an automobile  B. Make any legal decisions  C. Drink any alcoholic beverages  2)  You may resume your regular meals upon return home.  3)  A responsible adult must take you home.  Someone should stay with you for a few          hours, then be available by phone for the remainder of the treatment day.  4)  You May experience any of the following symptoms:  Headache, Nausea and a dry mouth (due to the medications you were given),  temporary memory loss and some confusion, or sore muscles (a warm bath  should help this).  If you you experience any of these symptoms let us know on                your return visit.  5)  Report any of the following: any acute discomfort, severe headache, or temperature        greater than 100.5 F.   Also report any unusual redness, swelling, drainage, or pain         at your IV site.    You may report Symptoms to:  ECT PROGRAM- [place] at [facility]          Phone: [phone], ECT Department           or Dr. Shary Key office [phone]  6)  Your next ECT Treatment is Monday January 10 at 8:30   We will call 2 days prior to your scheduled appointment for arrival times.  7)  Nothing to eat or drink after midnight the night before your procedure.  8)  Take     With a sip of water the morning of your procedure.  9)  Other Instructions: Call [phone] to cancel the morning of your procedure due         to illness or emergency.  10) We will call within 72 hours to assess how you are feeling.

## 2018-07-11 NOTE — Anesthesia Preprocedure Evaluation (Signed)
Anesthesia Evaluation  Patient identified by MRN, date of birth, ID band Patient awake    Reviewed: Allergy & Precautions, NPO status , Patient's Chart, lab work & pertinent test results  History of Anesthesia Complications Negative for: history of anesthetic complications  Airway Mallampati: II  TM Distance: >3 FB Neck ROM: Full    Dental no notable dental hx. (+) Chipped   Pulmonary neg pulmonary ROS, neg sleep apnea, neg COPD,    breath sounds clear to auscultation- rhonchi (-) wheezing      Cardiovascular Exercise Tolerance: Good (-) hypertension(-) CAD, (-) Past MI, (-) Cardiac Stents and (-) CABG  Rhythm:Regular Rate:Normal - Systolic murmurs and - Diastolic murmurs    Neuro/Psych  Headaches, neg Seizures PSYCHIATRIC DISORDERS Anxiety Depression    GI/Hepatic negative GI ROS, Neg liver ROS,   Endo/Other  diabetes, Type 1, Insulin Dependent  Renal/GU Renal disease     Musculoskeletal negative musculoskeletal ROS (+)   Abdominal (+) - obese,   Peds  Hematology negative hematology ROS (+)   Anesthesia Other Findings   Reproductive/Obstetrics                             Anesthesia Physical  Anesthesia Plan  ASA: III  Anesthesia Plan: General   Post-op Pain Management:    Induction: Intravenous  PONV Risk Score and Plan: 2  Airway Management Planned: Mask  Additional Equipment:   Intra-op Plan:   Post-operative Plan:   Informed Consent: I have reviewed the patients History and Physical, chart, labs and discussed the procedure including the risks, benefits and alternatives for the proposed anesthesia with the patient or authorized representative who has indicated his/her understanding and acceptance.     Plan Discussed with: CRNA and Anesthesiologist  Anesthesia Plan Comments:         Anesthesia Quick Evaluation

## 2018-07-11 NOTE — Anesthesia Post-op Follow-up Note (Signed)
Anesthesia QCDR form completed.        

## 2018-07-14 ENCOUNTER — Encounter: Payer: Self-pay | Admitting: Anesthesiology

## 2018-07-14 ENCOUNTER — Encounter (HOSPITAL_BASED_OUTPATIENT_CLINIC_OR_DEPARTMENT_OTHER)
Admission: RE | Admit: 2018-07-14 | Discharge: 2018-07-14 | Disposition: A | Discharge status: Home / Self Care | Payer: BC Managed Care – PPO | Source: Ambulatory Visit | Attending: Psychiatry | Admitting: Psychiatry

## 2018-07-14 ENCOUNTER — Other Ambulatory Visit: Payer: Self-pay | Admitting: Psychiatry

## 2018-07-14 DIAGNOSIS — F332 Major depressive disorder, recurrent severe without psychotic features: Secondary | ICD-10-CM

## 2018-07-14 DIAGNOSIS — F339 Major depressive disorder, recurrent, unspecified: Secondary | ICD-10-CM | POA: Diagnosis not present

## 2018-07-14 LAB — GLUCOSE, CAPILLARY: Glucose-Capillary: 139 mg/dL — ABNORMAL HIGH (ref 70–99)

## 2018-07-14 LAB — POCT PREGNANCY, URINE: Preg Test, Ur: NEGATIVE

## 2018-07-14 MED ORDER — FENTANYL CITRATE (PF) 100 MCG/2ML IJ SOLN: 25.0000 ug | INTRAMUSCULAR | Status: DC | PRN

## 2018-07-14 MED ORDER — METHOHEXITAL SODIUM 100 MG/10ML IV SOSY
PREFILLED_SYRINGE | INTRAVENOUS | Status: DC | PRN
  Administered 2018-07-14: 70 mg via INTRAVENOUS

## 2018-07-14 MED ORDER — SUCCINYLCHOLINE CHLORIDE 200 MG/10ML IV SOSY
PREFILLED_SYRINGE | INTRAVENOUS | Status: DC | PRN
  Administered 2018-07-14: 90 mg via INTRAVENOUS

## 2018-07-14 MED ORDER — GLYCOPYRROLATE 0.2 MG/ML IJ SOLN
0.2000 mg | Freq: Once | INTRAMUSCULAR | Status: AC
  Administered 2018-07-14: 0.2 mg via INTRAVENOUS

## 2018-07-14 MED ORDER — ONDANSETRON HCL 4 MG/2ML IJ SOLN
4.0000 mg | Freq: Once | INTRAMUSCULAR | Status: AC
  Administered 2018-07-14: 4 mg via INTRAVENOUS

## 2018-07-14 MED ORDER — SODIUM CHLORIDE 0.9 % IV SOLN
500.0000 mL | Freq: Once | INTRAVENOUS | Status: AC
  Administered 2018-07-14: 500 mL via INTRAVENOUS

## 2018-07-14 MED ORDER — KETOROLAC TROMETHAMINE 30 MG/ML IJ SOLN
30.0000 mg | Freq: Once | INTRAMUSCULAR | Status: AC
  Administered 2018-07-14: 30 mg via INTRAVENOUS

## 2018-07-14 MED ORDER — SODIUM CHLORIDE 0.9 % IV SOLN
INTRAVENOUS | Status: DC | PRN
  Administered 2018-07-14: 10:00:00 via INTRAVENOUS

## 2018-07-14 MED ORDER — SUCCINYLCHOLINE CHLORIDE 20 MG/ML IJ SOLN
INTRAMUSCULAR | Status: AC
  Filled 2018-07-14: qty 1

## 2018-07-14 MED ORDER — ONDANSETRON HCL 4 MG/2ML IJ SOLN: 4.0000 mg | Freq: Once | INTRAMUSCULAR | Status: DC | PRN

## 2018-07-14 MED ORDER — KETOROLAC TROMETHAMINE 30 MG/ML IJ SOLN
INTRAMUSCULAR | Status: AC
  Filled 2018-07-14: qty 1

## 2018-07-14 MED ORDER — GLYCOPYRROLATE 0.2 MG/ML IJ SOLN
INTRAMUSCULAR | Status: AC
  Filled 2018-07-14: qty 1

## 2018-07-14 MED ORDER — ONDANSETRON HCL 4 MG/2ML IJ SOLN
INTRAMUSCULAR | Status: AC
  Filled 2018-07-14: qty 2

## 2018-07-14 NOTE — Transfer of Care (Signed)
Immediate Anesthesia Transfer of Care Note  Patient: [name]  Procedure(s) Performed: ECT TX  Patient Location: PACU  Anesthesia Type:General  Level of Consciousness: sedated  Airway & Oxygen Therapy: Patient Spontanous Breathing and Patient connected to face mask oxygen  Post-op Assessment: Report given to RN and Post -op Vital signs reviewed and stable  Post vital signs: Reviewed and stable  Last Vitals:  Vitals Value Taken Time  BP    Temp    Pulse    Resp    SpO2      Last Pain:  Vitals:   07/14/18 0912  TempSrc: Oral  PainSc: 0-No pain         Complications: No apparent anesthesia complications

## 2018-07-14 NOTE — Anesthesia Procedure Notes (Signed)
Date/Time: 07/14/2018 11:44 AM Performed by: Lily Kocher, CRNA Pre-anesthesia Checklist: Patient identified, Emergency Drugs available, Suction available and Patient being monitored Patient Re-evaluated:Patient Re-evaluated prior to induction Oxygen Delivery Method: Circle system utilized Preoxygenation: Pre-oxygenation with 100% oxygen Induction Type: IV induction Ventilation: Mask ventilation without difficulty and Mask ventilation throughout procedure Airway Equipment and Method: Bite block Placement Confirmation: positive ETCO2 Dental Injury: Teeth and Oropharynx as per pre-operative assessment

## 2018-07-14 NOTE — H&P (Signed)
KARLETTA PRUDEN is an 43 y.o. female.   Chief Complaint: Mood continues to be depressed a little bit better HPI: History of severe recurrent depression  Past Medical History:  Diagnosis Date  . Adopted   . Cluster headache syndrome, intractable   . Depression   . Migraine without aura, with intractable migraine, so stated, without mention of status migrainosus 12/05/2012  . Ovarian cyst   . STD (sexually transmitted disease)    HSV II   . Type I diabetes mellitus (HCC)     Past Surgical History:  Procedure Laterality Date  . APPENDECTOMY    . BUNIONECTOMY Bilateral   . CHOLECYSTECTOMY    . EYE SURGERY    . OVARIAN CYST REMOVAL      Family History  Adopted: Yes  Problem Relation Age of Onset  . Diabetes Father    Social History:  reports that she has never smoked. She has never used smokeless tobacco. She reports current alcohol use. She reports that she does not use drugs.  Allergies:  Allergies  Allergen Reactions  . Gluten Meal Other (See Comments)    abdominal cramps  . Sulfa Antibiotics Nausea And Vomiting  . Wellbutrin [Bupropion] Other (See Comments)    "Makes me suicidal."  . Amoxicillin Rash    Has patient had a PCN reaction causing immediate rash, facial/tongue/throat swelling, SOB or lightheadedness with hypotension: Yes Has patient had a PCN reaction causing severe rash involving mucus membranes or skin necrosis: No Has patient had a PCN reaction that required hospitalization: No Has patient had a PCN reaction occurring within the last 10 years: No If all of the above answers are "NO", then may proceed with Cephalosporin use.   . Codeine Rash    Cannot take just codeine. Able to tolerate derivatives such as hydrocodone    (Not in a hospital admission)   Results for orders placed or performed during the hospital encounter of 07/14/18 (from the past 48 hour(s))  Pregnancy, urine POC     Status: None   Collection Time: 07/14/18  9:14 AM  Result Value Ref  Range   Preg Test, Ur NEGATIVE NEGATIVE    Comment:        THE SENSITIVITY OF THIS METHODOLOGY IS >24 mIU/mL   Glucose, capillary     Status: Abnormal   Collection Time: 07/14/18  9:20 AM  Result Value Ref Range   Glucose-Capillary 139 (H) 70 - 99 mg/dL   No results found.  Review of Systems  Constitutional: Negative.   HENT: Negative.   Eyes: Negative.   Respiratory: Negative.   Cardiovascular: Negative.   Gastrointestinal: Negative.   Musculoskeletal: Negative.   Skin: Negative.   Neurological: Negative.   Psychiatric/Behavioral: Negative.     Blood pressure 124/78, pulse 97, temperature (!) 97.1 F (36.2 C), temperature source Oral, resp. rate 18, height 5\' 1"  (1.549 m), weight 74.4 kg. Physical Exam  Nursing note and vitals reviewed. Constitutional: She appears well-developed and well-nourished.  HENT:  Head: Normocephalic and atraumatic.  Eyes: Pupils are equal, round, and reactive to light. Conjunctivae are normal.  Neck: Normal range of motion.  Cardiovascular: Normal heart sounds.  Respiratory: Effort normal.  GI: Soft.  Musculoskeletal: Normal range of motion.  Neurological: She is alert.  Skin: Skin is warm and dry.  Psychiatric: She has a normal mood and affect. Her behavior is normal. Judgment and thought content normal.     Assessment/Plan Continue index course of ECT Mordecai Rasmussen, MD 07/14/2018,  10:09 AM

## 2018-07-14 NOTE — Anesthesia Preprocedure Evaluation (Signed)
Anesthesia Evaluation  Patient identified by MRN, date of birth, ID band Patient awake    Reviewed: Allergy & Precautions, H&P , NPO status , Patient's Chart, lab work & pertinent test results, reviewed documented beta blocker date and time   Airway Mallampati: II   Neck ROM: full    Dental  (+) Poor Dentition   Pulmonary neg pulmonary ROS,    Pulmonary exam normal        Cardiovascular Exercise Tolerance: Good negative cardio ROS Normal cardiovascular exam Rhythm:regular Rate:Normal     Neuro/Psych  Headaches, PSYCHIATRIC DISORDERS Anxiety Depression    GI/Hepatic negative GI ROS, Neg liver ROS,   Endo/Other  negative endocrine ROSdiabetes  Renal/GU Renal disease  negative genitourinary   Musculoskeletal   Abdominal   Peds  Hematology negative hematology ROS (+)   Anesthesia Other Findings Past Medical History: No date: Adopted No date: Cluster headache syndrome, intractable No date: Depression 12/05/2012: Migraine without aura, with intractable migraine, so stated,  without mention of status migrainosus No date: Ovarian cyst No date: 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  No date: Type I diabetes mellitus (HCC) Past Surgical History: No date: APPENDECTOMY No date: BUNIONECTOMY; Bilateral No date: CHOLECYSTECTOMY No date: EYE SURGERY No date: OVARIAN CYST REMOVAL BMI    Body Mass Index:  30.99 kg/m     Reproductive/Obstetrics negative OB ROS                             Anesthesia Physical Anesthesia Plan  ASA: III  Anesthesia Plan: General   Post-op Pain Management:    Induction:   PONV Risk Score and Plan:   Airway Management Planned:   Additional Equipment:   Intra-op Plan:   Post-operative Plan:   Informed Consent: I have reviewed the patients History and Physical, chart, labs and discussed the procedure including the risks, benefits and  alternatives for the proposed anesthesia with the patient or authorized representative who has indicated his/her understanding and acceptance.   Dental Advisory Given  Plan Discussed with: CRNA  Anesthesia Plan Comments:         Anesthesia Quick Evaluation

## 2018-07-14 NOTE — Discharge Instructions (Addendum)
1)  The drugs that you have been given will stay in your system until tomorrow so for the       next 24 hours you should not:  A. Drive an automobile  B. Make any legal decisions  C. Drink any alcoholic beverages  2)  You may resume your regular meals upon return home.  3)  A responsible adult must take you home.  Someone should stay with you for a few          hours, then be available by phone for the remainder of the treatment day.  4)  You May experience any of the following symptoms:  Headache, Nausea and a dry mouth (due to the medications you were given),  temporary memory loss and some confusion, or sore muscles (a warm bath  should help this).  If you you experience any of these symptoms let us know on                your return visit.  5)  Report any of the following: any acute discomfort, severe headache, or temperature        greater than 100.5 F.   Also report any unusual redness, swelling, drainage, or pain         at your IV site.    You may report Symptoms to:  ECT PROGRAM- [place] [facility]          Phone: [phone], ECT Department           or Dr. Shary Keylapac's office [phone]  6)  Your next ECT Treatment is Wednesday January 15 8:30   We will call 2 days prior to your scheduled appointment for arrival times.  7)  Nothing to eat or drink after midnight the night before your procedure.  8)  Take    With a sip of water the morning of your procedure.  9)  Other Instructions: Call [phone] to cancel the morning of your procedure due         to illness or emergency.  10) We will call within 72 hours to assess how you are feeling.

## 2018-07-14 NOTE — Anesthesia Post-op Follow-up Note (Signed)
Anesthesia QCDR form completed.        

## 2018-07-14 NOTE — Procedures (Signed)
ECT SERVICES Physician's Interval Evaluation & Treatment Note  Patient Identification: [name] MRN:  [id number] Date of Evaluation:  07/14/2018 TX #: 4  MADRS:   MMSE:   P.E. Findings:  No change to physical  Psychiatric Interval Note:  A little more irritable perhaps more upbeat  Subjective:  Patient is a 43 y.o. female seen for evaluation for Electroconvulsive Therapy. No specific complaint except being hungry  Treatment Summary:   [x]   Right Unilateral             []  Bilateral   % Energy : 0.3 ms 40%   Impedance: 2850 ohms  Seizure Energy Index: 9817 V squared  Postictal Suppression Index: 71%  Seizure Concordance Index: 94%  Medications  Pre Shock: Robinul 0.2 mg Toradol 30 mg Brevital 70 mg succinylcholine 90 mg  Post Shock:    Seizure Duration: 46 seconds by muscle movements 72 seconds by EEG   Comments: Follow-up Wednesday  Lungs:  [x]   Clear to auscultation               []  Other:   Heart:    [x]   Regular rhythm             []  irregular rhythm    [x]   Previous H&P reviewed, patient examined and there are NO CHANGES                 []   Previous H&P reviewed, patient examined and there are changes noted.   Mordecai Rasmussen, MD 1/13/202011:47 AM

## 2018-07-15 ENCOUNTER — Other Ambulatory Visit: Payer: Self-pay | Admitting: Psychiatry

## 2018-07-16 ENCOUNTER — Ambulatory Visit
Admission: RE | Admit: 2018-07-16 | Discharge: 2018-07-16 | Disposition: A | Discharge status: Home / Self Care | Payer: BC Managed Care – PPO | Source: Ambulatory Visit | Attending: Psychiatry | Admitting: Psychiatry

## 2018-07-16 ENCOUNTER — Telehealth (HOSPITAL_COMMUNITY): Payer: Self-pay | Admitting: *Deleted

## 2018-07-16 ENCOUNTER — Encounter: Payer: Self-pay | Admitting: Anesthesiology

## 2018-07-16 ENCOUNTER — Ambulatory Visit: Payer: Self-pay | Admitting: Anesthesiology

## 2018-07-16 ENCOUNTER — Telehealth: Payer: Self-pay

## 2018-07-16 DIAGNOSIS — F332 Major depressive disorder, recurrent severe without psychotic features: Secondary | ICD-10-CM | POA: Diagnosis not present

## 2018-07-16 DIAGNOSIS — F329 Major depressive disorder, single episode, unspecified: Secondary | ICD-10-CM | POA: Insufficient documentation

## 2018-07-16 DIAGNOSIS — Z9049 Acquired absence of other specified parts of digestive tract: Secondary | ICD-10-CM | POA: Diagnosis not present

## 2018-07-16 DIAGNOSIS — Z880 Allergy status to penicillin: Secondary | ICD-10-CM | POA: Diagnosis not present

## 2018-07-16 DIAGNOSIS — Z885 Allergy status to narcotic agent status: Secondary | ICD-10-CM | POA: Insufficient documentation

## 2018-07-16 DIAGNOSIS — Z833 Family history of diabetes mellitus: Secondary | ICD-10-CM | POA: Insufficient documentation

## 2018-07-16 DIAGNOSIS — Z888 Allergy status to other drugs, medicaments and biological substances status: Secondary | ICD-10-CM | POA: Diagnosis not present

## 2018-07-16 DIAGNOSIS — Z91018 Allergy to other foods: Secondary | ICD-10-CM | POA: Diagnosis not present

## 2018-07-16 DIAGNOSIS — Z882 Allergy status to sulfonamides status: Secondary | ICD-10-CM | POA: Insufficient documentation

## 2018-07-16 DIAGNOSIS — Z794 Long term (current) use of insulin: Secondary | ICD-10-CM | POA: Insufficient documentation

## 2018-07-16 LAB — GLUCOSE, CAPILLARY
Glucose-Capillary: 167 mg/dL — ABNORMAL HIGH (ref 70–99)
Glucose-Capillary: 162 mg/dL — ABNORMAL HIGH (ref 70–99)

## 2018-07-16 MED ORDER — SODIUM CHLORIDE 0.9 % IV SOLN
INTRAVENOUS | Status: DC | PRN
  Administered 2018-07-16: 09:00:00 via INTRAVENOUS

## 2018-07-16 MED ORDER — GLYCOPYRROLATE 0.2 MG/ML IJ SOLN
0.2000 mg | Freq: Once | INTRAMUSCULAR | Status: AC
  Administered 2018-07-16: 0.2 mg via INTRAVENOUS

## 2018-07-16 MED ORDER — ONDANSETRON HCL 4 MG/2ML IJ SOLN
4.0000 mg | Freq: Once | INTRAMUSCULAR | Status: AC
  Administered 2018-07-16: 4 mg via INTRAVENOUS

## 2018-07-16 MED ORDER — METHOHEXITAL SODIUM 100 MG/10ML IV SOSY
PREFILLED_SYRINGE | INTRAVENOUS | Status: DC | PRN
  Administered 2018-07-16: 70 mg via INTRAVENOUS

## 2018-07-16 MED ORDER — SODIUM CHLORIDE 0.9 % IV SOLN
500.0000 mL | Freq: Once | INTRAVENOUS | Status: AC
  Administered 2018-07-16: 500 mL via INTRAVENOUS

## 2018-07-16 MED ORDER — ONDANSETRON HCL 4 MG/2ML IJ SOLN
INTRAMUSCULAR | Status: AC
  Administered 2018-07-16: 4 mg via INTRAVENOUS
  Filled 2018-07-16: qty 2

## 2018-07-16 MED ORDER — SUCCINYLCHOLINE CHLORIDE 20 MG/ML IJ SOLN
INTRAMUSCULAR | Status: DC | PRN
  Administered 2018-07-16: 90 mg via INTRAVENOUS

## 2018-07-16 MED ORDER — KETOROLAC TROMETHAMINE 30 MG/ML IJ SOLN
30.0000 mg | Freq: Once | INTRAMUSCULAR | Status: AC
  Administered 2018-07-16: 30 mg via INTRAVENOUS

## 2018-07-16 MED ORDER — GLYCOPYRROLATE 0.2 MG/ML IJ SOLN
INTRAMUSCULAR | Status: AC
  Administered 2018-07-16: 0.2 mg via INTRAVENOUS
  Filled 2018-07-16: qty 1

## 2018-07-16 MED ORDER — KETOROLAC TROMETHAMINE 30 MG/ML IJ SOLN
INTRAMUSCULAR | Status: AC
  Administered 2018-07-16: 30 mg via INTRAVENOUS
  Filled 2018-07-16: qty 1

## 2018-07-16 NOTE — Anesthesia Preprocedure Evaluation (Signed)
Anesthesia Evaluation  Patient identified by MRN, date of birth, ID band Patient awake    Reviewed: Allergy & Precautions, NPO status , Patient's Chart, lab work & pertinent test results  History of Anesthesia Complications Negative for: history of anesthetic complications  Airway Mallampati: II  TM Distance: >3 FB Neck ROM: Full    Dental no notable dental hx. (+) Chipped   Pulmonary neg pulmonary ROS, neg sleep apnea, neg COPD,    breath sounds clear to auscultation- rhonchi (-) wheezing      Cardiovascular Exercise Tolerance: Good (-) hypertension(-) CAD, (-) Past MI, (-) Cardiac Stents and (-) CABG  Rhythm:Regular Rate:Normal - Systolic murmurs and - Diastolic murmurs    Neuro/Psych  Headaches, neg Seizures PSYCHIATRIC DISORDERS Anxiety Depression    GI/Hepatic negative GI ROS, Neg liver ROS,   Endo/Other  diabetes, Type 1, Insulin Dependent  Renal/GU Renal disease     Musculoskeletal negative musculoskeletal ROS (+)   Abdominal (+) - obese,   Peds  Hematology negative hematology ROS (+)   Anesthesia Other Findings Past Medical History: No date: Adopted No date: Cluster headache syndrome, intractable No date: Depression 12/05/2012: Migraine without aura, with intractable migraine, so stated,  without mention of status migrainosus No date: Ovarian cyst No date: 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  No date: Type I diabetes mellitus (HCC)   Reproductive/Obstetrics                             Anesthesia Physical  Anesthesia Plan  ASA: III  Anesthesia Plan: General   Post-op Pain Management:    Induction: Intravenous  PONV Risk Score and Plan: 2  Airway Management Planned: Mask  Additional Equipment:   Intra-op Plan:   Post-operative Plan:   Informed Consent: I have reviewed the patients History and Physical, chart, labs and discussed the procedure  including the risks, benefits and alternatives for the proposed anesthesia with the patient or authorized representative who has indicated his/her understanding and acceptance.     Dental advisory given  Plan Discussed with: CRNA and Anesthesiologist  Anesthesia Plan Comments: (Patient consented for risks of anesthesia including but not limited to:  - adverse reactions to medications - risk of intubation if required - damage to teeth, lips or other oral mucosa - sore throat or hoarseness - Damage to heart, brain, lungs or loss of life  Patient voiced understanding.)        Anesthesia Quick Evaluation

## 2018-07-16 NOTE — Discharge Instructions (Signed)
1)  The drugs that you have been given will stay in your system until tomorrow so for the       next 24 hours you should not:  A. Drive an automobile  B. Make any legal decisions  C. Drink any alcoholic beverages  2)  You may resume your regular meals upon return home.  3)  A responsible adult must take you home.  Someone should stay with you for a few          hours, then be available by phone for the remainder of the treatment day.  4)  You May experience any of the following symptoms:  Headache, Nausea and a dry mouth (due to the medications you were given),  temporary memory loss and some confusion, or sore muscles (a warm bath  should help this).  If you you experience any of these symptoms let us know on                your return visit.  5)  Report any of the following: any acute discomfort, severe headache, or temperature        greater than 100.5 F.   Also report any unusual redness, swelling, drainage, or pain         at your IV site.    You may report Symptoms to:  ECT PROGRAM- [place] [facility]          Phone: [phone], ECT Department           or Dr. Shary Keylapac's office [phone]  6)  Your next ECT Treatment is WHEN YOU CALL  We will call 2 days prior to your scheduled appointment for arrival times.  7)  Nothing to eat or drink after midnight the night before your procedure.  8)  Take     With a sip of water the morning of your procedure.  9)  Other Instructions: Call [phone] to cancel the morning of your procedure due         to illness or emergency.  10) We will call within 72 hours to assess how you are feeling.

## 2018-07-16 NOTE — Telephone Encounter (Signed)
[name] with [facility] called to give authorization of ECT from 07/07/18-07/23/18 for 8 sessions of ECT. Was told that 07/04/18 will need to be appealed since this writer did not speak with a representative on 07/04/18. Explained that this writer was on hold for 2.5 hours and called back on 07/07/18. Given number to appeals department. Auth #[id number].

## 2018-07-16 NOTE — Procedures (Signed)
ECT SERVICES Physician's Interval Evaluation & Treatment Note   Patient Identification: [name] MRN:  [id number] Date of Evaluation:  07/16/2018 TX #: 5  MADRS:   MMSE:   P.E. Findings:  No change to physical exam  Psychiatric Interval Note:  Patient reports that she is feeling "weird" from the treatment.  She is unsure if it is helping.  We subsequently were told by her boyfriend that he think she is definitely better.  Not reporting any psychotic symptoms.  Only mild feelings of memory impairment.  Subjective:  Patient is a 43 y.o. female seen for evaluation for Electroconvulsive Therapy. Not suicidal not psychotic patient seems mildly anxious  Treatment Summary:   [x]   Right Unilateral             []  Bilateral   % Energy : 0.3 ms 40%   Impedance: 2490 ohms  Seizure Energy Index: 2983 V squared  Postictal Suppression Index: 74%  Seizure Concordance Index: 93%  Medications  Pre Shock: Robinul 0.2 mg Toradol 30 mg Brevital 70 mg succinylcholine 90 mg  Post Shock:    Seizure Duration: 41 seconds by muscle movements 73 seconds by EEG   Comments: Patient had stated that she was unsure if she would come back on Friday but she did state that she wanted to have her treatment today.  Obviously seems ambivalent about treatment.  I am going to send a brief note to her outpatient psychiatrist updating her.  I am happy to continue with the treatment but certainly am not going to try to press the patient against her will.  Lungs:  [x]   Clear to auscultation               []  Other:   Heart:    [x]   Regular rhythm             []  irregular rhythm    [x]   Previous H&P reviewed, patient examined and there are NO CHANGES                 []   Previous H&P reviewed, patient examined and there are changes noted.   Mordecai Rasmussen, MD 1/15/20203:10 PM

## 2018-07-16 NOTE — Anesthesia Postprocedure Evaluation (Signed)
Anesthesia Post Note  Patient: [name]  Procedure(s) Performed: ECT TX  Patient location during evaluation: PACU Anesthesia Type: General Level of consciousness: awake and alert Pain management: pain level controlled Vital Signs Assessment: post-procedure vital signs reviewed and stable Respiratory status: spontaneous breathing, nonlabored ventilation, respiratory function stable and patient connected to nasal cannula oxygen Cardiovascular status: blood pressure returned to baseline and stable Postop Assessment: no apparent nausea or vomiting Anesthetic complications: no     Last Vitals:  Vitals:   07/16/18 1046 07/16/18 1105  BP: 131/87 137/83  Pulse: 100 93  Resp: 18 16  Temp:  36.4 C  SpO2: 95%     Last Pain:  Vitals:   07/16/18 1105  TempSrc: Oral  PainSc: 0-No pain                 Cleda Mccreedy Piscitello

## 2018-07-16 NOTE — Transfer of Care (Signed)
Immediate Anesthesia Transfer of Care Note  Patient: Candice CruiseCheri D Dickmann  Procedure(s) Performed: ECT TX  Patient Location: PACU  Anesthesia Type:General  Level of Consciousness: sedated  Airway & Oxygen Therapy: Patient Spontanous Breathing and Patient connected to face mask oxygen  Post-op Assessment: Report given to RN and Post -op Vital signs reviewed and stable  Post vital signs: Reviewed and stable  Last Vitals:  Vitals Value Taken Time  BP 122/94 07/16/2018 10:27 AM  Temp    Pulse 99 07/16/2018 10:27 AM  Resp 14 07/16/2018 10:27 AM  SpO2 99 % 07/16/2018 10:27 AM  Vitals shown include unvalidated device data.  Last Pain:  Vitals:   07/16/18 0904  TempSrc:   PainSc: 0-No pain         Complications: No apparent anesthesia complications

## 2018-07-16 NOTE — Anesthesia Post-op Follow-up Note (Signed)
Anesthesia QCDR form completed.        

## 2018-07-16 NOTE — Anesthesia Postprocedure Evaluation (Signed)
Anesthesia Post Note  Patient: [name]  Procedure(s) Performed: ECT TX  Patient location during evaluation: PACU Anesthesia Type: General Level of consciousness: awake and alert Pain management: pain level controlled Vital Signs Assessment: post-procedure vital signs reviewed and stable Respiratory status: spontaneous breathing, nonlabored ventilation, respiratory function stable and patient connected to nasal cannula oxygen Cardiovascular status: blood pressure returned to baseline and stable Postop Assessment: no apparent nausea or vomiting Anesthetic complications: no     Last Vitals:  Vitals:   07/14/18 1215 07/14/18 1232  BP: 113/61 124/85  Pulse: 95 98  Resp: 18 16  Temp: 36.8 C     Last Pain:  Vitals:   07/14/18 1232  TempSrc: Oral  PainSc: 0-No pain                 Yevette Edwards

## 2018-07-16 NOTE — H&P (Signed)
[name] is an 43 y.o. female.   Chief Complaint: Feeling weird from treatments HPI: chronic depression   Past Medical History:  Diagnosis Date  . Adopted   . Cluster headache syndrome, intractable   . Depression   . Migraine without aura, with intractable migraine, so stated, without mention of status migrainosus 12/05/2012  . Ovarian cyst   . STD (sexually transmitted disease)    HSV II   . Type I diabetes mellitus (HCC)     Past Surgical History:  Procedure Laterality Date  . APPENDECTOMY    . BUNIONECTOMY Bilateral   . CHOLECYSTECTOMY    . EYE SURGERY    . OVARIAN CYST REMOVAL      Family History  Adopted: Yes  Problem Relation Age of Onset  . Diabetes Father    Social History:  reports that she has never smoked. She has never used smokeless tobacco. She reports current alcohol use. She reports that she does not use drugs.  Allergies:  Allergies  Allergen Reactions  . Gluten Meal Other (See Comments)    abdominal cramps  . Sulfa Antibiotics Nausea And Vomiting  . Wellbutrin [Bupropion] Other (See Comments)    "Makes me suicidal."  . Amoxicillin Rash    Has patient had a PCN reaction causing immediate rash, facial/tongue/throat swelling, SOB or lightheadedness with hypotension: Yes Has patient had a PCN reaction causing severe rash involving mucus membranes or skin necrosis: No Has patient had a PCN reaction that required hospitalization: No Has patient had a PCN reaction occurring within the last 10 years: No If all of the above answers are "NO", then may proceed with Cephalosporin use.   . Codeine Rash    Cannot take just codeine. Able to tolerate derivatives such as hydrocodone    (Not in a hospital admission)   Results for orders placed or performed during the hospital encounter of 07/16/18 (from the past 48 hour(s))  Glucose, capillary     Status: Abnormal   Collection Time: 07/16/18  9:01 AM  Result Value Ref Range   Glucose-Capillary 167 (H) 70  - 99 mg/dL   No results found.  Review of Systems  Constitutional: Negative.   HENT: Negative.   Eyes: Negative.   Respiratory: Negative.   Cardiovascular: Negative.   Gastrointestinal: Negative.   Musculoskeletal: Negative.   Skin: Negative.   Neurological: Negative.   Psychiatric/Behavioral: Positive for depression. Negative for hallucinations, memory loss, substance abuse and suicidal ideas. The patient is nervous/anxious. The patient does not have insomnia.     Blood pressure (!) 145/82, pulse 98, temperature (!) 97.3 F (36.3 C), temperature source Oral, resp. rate 18, height 5\' 1"  (1.549 m), weight 74.8 kg, SpO2 100 %. Physical Exam  Nursing note and vitals reviewed. Constitutional: She appears well-developed and well-nourished.  HENT:  Head: Normocephalic and atraumatic.  Eyes: Pupils are equal, round, and reactive to light. Conjunctivae are normal.  Neck: Normal range of motion.  Cardiovascular: Normal heart sounds.  Respiratory: Effort normal.  GI: Soft.  Musculoskeletal: Normal range of motion.  Neurological: She is alert.  Skin: Skin is warm and dry.  Psychiatric: Her speech is normal and behavior is normal. Her mood appears anxious.     Assessment/Plan PAtient prefers trestment today but will hold off on Friday and call us  Mordecai Rasmussen, MD 07/16/2018, 10:01 AM

## 2018-07-18 ENCOUNTER — Encounter: Payer: Self-pay | Admitting: Anesthesiology

## 2018-07-18 ENCOUNTER — Encounter (HOSPITAL_BASED_OUTPATIENT_CLINIC_OR_DEPARTMENT_OTHER)
Admission: RE | Admit: 2018-07-18 | Discharge: 2018-07-18 | Disposition: A | Discharge status: Home / Self Care | Payer: BC Managed Care – PPO | Source: Ambulatory Visit | Attending: Psychiatry | Admitting: Psychiatry

## 2018-07-18 ENCOUNTER — Other Ambulatory Visit: Payer: Self-pay | Admitting: Psychiatry

## 2018-07-18 DIAGNOSIS — F332 Major depressive disorder, recurrent severe without psychotic features: Secondary | ICD-10-CM

## 2018-07-18 LAB — GLUCOSE, CAPILLARY
Glucose-Capillary: 117 mg/dL — ABNORMAL HIGH (ref 70–99)
Glucose-Capillary: 130 mg/dL — ABNORMAL HIGH (ref 70–99)

## 2018-07-18 LAB — POCT PREGNANCY, URINE: Preg Test, Ur: NEGATIVE

## 2018-07-18 MED ORDER — ONDANSETRON HCL 4 MG/2ML IJ SOLN
4.0000 mg | Freq: Once | INTRAMUSCULAR | Status: AC
  Administered 2018-07-18: 4 mg via INTRAVENOUS

## 2018-07-18 MED ORDER — SODIUM CHLORIDE 0.9 % IV SOLN
500.0000 mL | Freq: Once | INTRAVENOUS | Status: AC
  Administered 2018-07-18: 500 mL via INTRAVENOUS

## 2018-07-18 MED ORDER — SODIUM CHLORIDE 0.9 % IV SOLN
INTRAVENOUS | Status: DC | PRN
  Administered 2018-07-18: 10:00:00 via INTRAVENOUS

## 2018-07-18 MED ORDER — SUCCINYLCHOLINE CHLORIDE 20 MG/ML IJ SOLN
INTRAMUSCULAR | Status: DC | PRN
  Administered 2018-07-18: 90 mg via INTRAVENOUS

## 2018-07-18 MED ORDER — GLYCOPYRROLATE 0.2 MG/ML IJ SOLN
0.2000 mg | Freq: Once | INTRAMUSCULAR | Status: AC
  Administered 2018-07-18: 0.2 mg via INTRAVENOUS

## 2018-07-18 MED ORDER — METHOHEXITAL SODIUM 0.5 G IJ SOLR
INTRAMUSCULAR | Status: AC
  Filled 2018-07-18: qty 500

## 2018-07-18 MED ORDER — KETOROLAC TROMETHAMINE 30 MG/ML IJ SOLN
30.0000 mg | Freq: Once | INTRAMUSCULAR | Status: AC
  Administered 2018-07-18: 30 mg via INTRAVENOUS

## 2018-07-18 MED ORDER — ONDANSETRON HCL 4 MG/2ML IJ SOLN
INTRAMUSCULAR | Status: AC
  Filled 2018-07-18: qty 2

## 2018-07-18 MED ORDER — KETOROLAC TROMETHAMINE 30 MG/ML IJ SOLN
INTRAMUSCULAR | Status: AC
  Administered 2018-07-18: 30 mg via INTRAVENOUS
  Filled 2018-07-18: qty 1

## 2018-07-18 MED ORDER — SUCCINYLCHOLINE CHLORIDE 20 MG/ML IJ SOLN
INTRAMUSCULAR | Status: AC
  Filled 2018-07-18: qty 1

## 2018-07-18 MED ORDER — GLYCOPYRROLATE 0.2 MG/ML IJ SOLN
INTRAMUSCULAR | Status: AC
  Administered 2018-07-18: 0.2 mg via INTRAVENOUS
  Filled 2018-07-18: qty 1

## 2018-07-18 MED ORDER — METHOHEXITAL SODIUM 100 MG/10ML IV SOSY
PREFILLED_SYRINGE | INTRAVENOUS | Status: DC | PRN
  Administered 2018-07-18: 70 mg via INTRAVENOUS

## 2018-07-18 NOTE — H&P (Signed)
[name] is an 43 y.o. female.   Chief Complaint: Patient is feeling like she is having significant memory impairment but also that her mood is improved HPI: History of recurrent depression  Past Medical History:  Diagnosis Date  . Adopted   . Cluster headache syndrome, intractable   . Depression   . Migraine without aura, with intractable migraine, so stated, without mention of status migrainosus 12/05/2012  . Ovarian cyst   . STD (sexually transmitted disease)    HSV II   . Type I diabetes mellitus (HCC)     Past Surgical History:  Procedure Laterality Date  . APPENDECTOMY    . BUNIONECTOMY Bilateral   . CHOLECYSTECTOMY    . EYE SURGERY    . OVARIAN CYST REMOVAL      Family History  Adopted: Yes  Problem Relation Age of Onset  . Diabetes Father    Social History:  reports that she has never smoked. She has never used smokeless tobacco. She reports current alcohol use. She reports that she does not use drugs.  Allergies:  Allergies  Allergen Reactions  . Gluten Meal Other (See Comments)    abdominal cramps  . Sulfa Antibiotics Nausea And Vomiting  . Wellbutrin [Bupropion] Other (See Comments)    "Makes me suicidal."  . Amoxicillin Rash    Has patient had a PCN reaction causing immediate rash, facial/tongue/throat swelling, SOB or lightheadedness with hypotension: Yes Has patient had a PCN reaction causing severe rash involving mucus membranes or skin necrosis: No Has patient had a PCN reaction that required hospitalization: No Has patient had a PCN reaction occurring within the last 10 years: No If all of the above answers are "NO", then may proceed with Cephalosporin use.   . Codeine Rash    Cannot take just codeine. Able to tolerate derivatives such as hydrocodone    (Not in a hospital admission)   Results for orders placed or performed during the hospital encounter of 07/18/18 (from the past 48 hour(s))  Pregnancy, urine POC     Status: None   Collection Time: 07/18/18  9:04 AM  Result Value Ref Range   Preg Test, Ur NEGATIVE NEGATIVE    Comment:        THE SENSITIVITY OF THIS METHODOLOGY IS >24 mIU/mL   Glucose, capillary     Status: Abnormal   Collection Time: 07/18/18  9:10 AM  Result Value Ref Range   Glucose-Capillary 117 (H) 70 - 99 mg/dL   No results found.  Review of Systems  Constitutional: Negative.   HENT: Negative.   Eyes: Negative.   Respiratory: Negative.   Cardiovascular: Negative.   Gastrointestinal: Negative.   Musculoskeletal: Negative.   Skin: Negative.   Neurological: Negative.   Psychiatric/Behavioral: Positive for memory loss. Negative for depression, hallucinations, substance abuse and suicidal ideas. The patient is nervous/anxious. The patient does not have insomnia.     Blood pressure 136/89, pulse 91, resp. rate 18, SpO2 100 %. Physical Exam  Nursing note and vitals reviewed. Constitutional: She appears well-developed and well-nourished.  HENT:  Head: Normocephalic and atraumatic.  Eyes: Pupils are equal, round, and reactive to light. Conjunctivae are normal.  Neck: Normal range of motion.  Cardiovascular: Normal heart sounds.  Respiratory: Effort normal.  GI: Soft.  Musculoskeletal: Normal range of motion.  Neurological: She is alert.  Skin: Skin is warm and dry.  Psychiatric: She has a normal mood and affect. Her behavior is normal. Judgment and thought content normal.  Assessment/Plan Discussed treatment plan and proposed continuing schedule on Monday although we will reassess every treatment for a time to stop the index course  Mordecai Rasmussen, MD 07/18/2018, 10:05 AM

## 2018-07-18 NOTE — Discharge Instructions (Signed)
1)  The drugs that you have been given will stay in your system until tomorrow so for the       next 24 hours you should not:  A. Drive an automobile  B. Make any legal decisions  C. Drink any alcoholic beverages  2)  You may resume your regular meals upon return home.  3)  A responsible adult must take you home.  Someone should stay with you for a few          hours, then be available by phone for the remainder of the treatment day.  4)  You May experience any of the following symptoms:  Headache, Nausea and a dry mouth (due to the medications you were given),  temporary memory loss and some confusion, or sore muscles (a warm bath  should help this).  If you you experience any of these symptoms let us know on                your return visit.  5)  Report any of the following: any acute discomfort, severe headache, or temperature        greater than 100.5 F.   Also report any unusual redness, swelling, drainage, or pain         at your IV site.    You may report Symptoms to:  ECT PROGRAM- [place] [facility]          Phone: [phone], ECT Department           or Dr. Shary Key office [phone]  6)  Your next ECT Treatment is Day monday  Date July 21, 2018  We will call 2 days prior to your scheduled appointment for arrival times.  7)  Nothing to eat or drink after midnight the night before your procedure.  8)  Take .     With a sip of water the morning of your procedure.  9)  Other Instructions: Call [phone] to cancel the morning of your procedure due         to illness or emergency.  10) We will call within 72 hours to assess how you are feeling.

## 2018-07-18 NOTE — Anesthesia Preprocedure Evaluation (Signed)
Anesthesia Evaluation  Patient identified by MRN, date of birth, ID band Patient awake    Reviewed: Allergy & Precautions, NPO status , Patient's Chart, lab work & pertinent test results  History of Anesthesia Complications Negative for: history of anesthetic complications  Airway Mallampati: II  TM Distance: >3 FB Neck ROM: Full    Dental no notable dental hx. (+) Chipped   Pulmonary neg pulmonary ROS, neg sleep apnea, neg COPD,    breath sounds clear to auscultation- rhonchi (-) wheezing      Cardiovascular Exercise Tolerance: Good (-) hypertension(-) CAD, (-) Past MI, (-) Cardiac Stents and (-) CABG  Rhythm:Regular Rate:Normal - Systolic murmurs and - Diastolic murmurs    Neuro/Psych  Headaches, neg Seizures PSYCHIATRIC DISORDERS Anxiety Depression    GI/Hepatic negative GI ROS, Neg liver ROS,   Endo/Other  diabetes, Type 1, Insulin Dependent  Renal/GU Renal disease     Musculoskeletal negative musculoskeletal ROS (+)   Abdominal (+) - obese,   Peds  Hematology negative hematology ROS (+)   Anesthesia Other Findings Past Medical History: No date: Adopted No date: Cluster headache syndrome, intractable No date: Depression 12/05/2012: Migraine without aura, with intractable migraine, so stated,  without mention of status migrainosus No date: Ovarian cyst No date: 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  No date: Type I diabetes mellitus (HCC)   Reproductive/Obstetrics                             Anesthesia Physical  Anesthesia Plan  ASA: III  Anesthesia Plan: General   Post-op Pain Management:    Induction: Intravenous  PONV Risk Score and Plan: 2  Airway Management Planned: Mask  Additional Equipment:   Intra-op Plan:   Post-operative Plan:   Informed Consent: I have reviewed the patients History and Physical, chart, labs and discussed the procedure  including the risks, benefits and alternatives for the proposed anesthesia with the patient or authorized representative who has indicated his/her understanding and acceptance.     Dental advisory given  Plan Discussed with: CRNA and Anesthesiologist  Anesthesia Plan Comments: (Patient consented for risks of anesthesia including but not limited to:  - adverse reactions to medications - risk of intubation if required - damage to teeth, lips or other oral mucosa - sore throat or hoarseness - Damage to heart, brain, lungs or loss of life  Patient voiced understanding.)        Anesthesia Quick Evaluation  

## 2018-07-18 NOTE — Anesthesia Procedure Notes (Signed)
Date/Time: 07/18/2018 10:14 AM Performed by: Omer Jack, CRNA Pre-anesthesia Checklist: Patient identified, Emergency Drugs available, Suction available and Patient being monitored Patient Re-evaluated:Patient Re-evaluated prior to induction Oxygen Delivery Method: Circle system utilized Preoxygenation: Pre-oxygenation with 100% oxygen Induction Type: IV induction Ventilation: Mask ventilation without difficulty and Mask ventilation throughout procedure Airway Equipment and Method: Bite block Placement Confirmation: positive ETCO2 Dental Injury: Teeth and Oropharynx as per pre-operative assessment

## 2018-07-18 NOTE — Transfer of Care (Signed)
Immediate Anesthesia Transfer of Care Note  Patient: [name]  Procedure(s) Performed: ECT TX  Patient Location: PACU  Anesthesia Type:General  Level of Consciousness: drowsy and patient cooperative  Airway & Oxygen Therapy: Patient Spontanous Breathing and Patient connected to face mask oxygen  Post-op Assessment: Report given to RN and Post -op Vital signs reviewed and stable  Post vital signs: Reviewed and stable  Last Vitals:  Vitals Value Taken Time  BP 123/88 07/18/2018 10:24 AM  Temp    Pulse 89 07/18/2018 10:24 AM  Resp 25 07/18/2018 10:24 AM  SpO2 99 % 07/18/2018 10:24 AM    Last Pain:  Vitals:   07/18/18 0903  PainSc: 0-No pain         Complications: No apparent anesthesia complications

## 2018-07-18 NOTE — Procedures (Signed)
ECT SERVICES Physician's Interval Evaluation & Treatment Note  Patient Identification: [name] MRN:  [id number] Date of Evaluation:  07/18/2018 TX #: [id number]  MADRS:   MMSE:   P.E. Findings:  No change to physical exam  Psychiatric Interval Note:  Patient feels like her mood is better although she is having some memory problems  Subjective:  Patient is a 43 y.o. female seen for evaluation for Electroconvulsive Therapy. Noticing some memory impairment finds it frustrating  Treatment Summary:   [x]   Right Unilateral             []  Bilateral   % Energy : 0.3 ms 40%   Impedance: 2180 ohms  Seizure Energy Index: 4650 V squared  Postictal Suppression Index: 44%  Seizure Concordance Index: 80%  Medications  Pre Shock: Robinul 0.2 mg Toradol 30 mg Brevital 70 mg succinylcholine 90 mg  Post Shock:    Seizure Duration: 45 seconds by EMG 69 seconds by EEG   Comments: Follow-up on Monday with ongoing assessment of wind and the index course  Lungs:  [x]   Clear to auscultation               []  Other:   Heart:    [x]   Regular rhythm             []  irregular rhythm    [x]   Previous H&P reviewed, patient examined and there are NO CHANGES                 []   Previous H&P reviewed, patient examined and there are changes noted.   Mordecai Rasmussen, MD 1/17/202010:07 AM

## 2018-07-18 NOTE — Anesthesia Post-op Follow-up Note (Signed)
Anesthesia QCDR form completed.        

## 2018-07-21 ENCOUNTER — Observation Stay (HOSPITAL_COMMUNITY): Payer: BC Managed Care – PPO

## 2018-07-21 ENCOUNTER — Observation Stay (HOSPITAL_COMMUNITY)
Admission: EM | Admit: 2018-07-21 | Discharge: 2018-07-23 | Disposition: A | Discharge status: Home / Self Care | Payer: BC Managed Care – PPO | Attending: Internal Medicine | Admitting: Internal Medicine

## 2018-07-21 ENCOUNTER — Encounter (HOSPITAL_COMMUNITY): Payer: Self-pay | Admitting: Family Medicine

## 2018-07-21 ENCOUNTER — Telehealth: Payer: Self-pay

## 2018-07-21 ENCOUNTER — Other Ambulatory Visit: Payer: Self-pay | Admitting: Psychiatry

## 2018-07-21 DIAGNOSIS — E1310 Other specified diabetes mellitus with ketoacidosis without coma: Principal | ICD-10-CM | POA: Insufficient documentation

## 2018-07-21 DIAGNOSIS — F418 Other specified anxiety disorders: Secondary | ICD-10-CM | POA: Diagnosis not present

## 2018-07-21 DIAGNOSIS — Z79899 Other long term (current) drug therapy: Secondary | ICD-10-CM | POA: Insufficient documentation

## 2018-07-21 DIAGNOSIS — IMO0001 Reserved for inherently not codable concepts without codable children: Secondary | ICD-10-CM

## 2018-07-21 DIAGNOSIS — D72829 Elevated white blood cell count, unspecified: Secondary | ICD-10-CM | POA: Insufficient documentation

## 2018-07-21 DIAGNOSIS — F419 Anxiety disorder, unspecified: Secondary | ICD-10-CM | POA: Insufficient documentation

## 2018-07-21 DIAGNOSIS — F329 Major depressive disorder, single episode, unspecified: Secondary | ICD-10-CM | POA: Insufficient documentation

## 2018-07-21 DIAGNOSIS — N179 Acute kidney failure, unspecified: Secondary | ICD-10-CM | POA: Diagnosis not present

## 2018-07-21 DIAGNOSIS — R112 Nausea with vomiting, unspecified: Secondary | ICD-10-CM | POA: Diagnosis present

## 2018-07-21 DIAGNOSIS — E119 Type 2 diabetes mellitus without complications: Secondary | ICD-10-CM

## 2018-07-21 DIAGNOSIS — Z794 Long term (current) use of insulin: Secondary | ICD-10-CM | POA: Diagnosis not present

## 2018-07-21 DIAGNOSIS — E109 Type 1 diabetes mellitus without complications: Secondary | ICD-10-CM | POA: Insufficient documentation

## 2018-07-21 DIAGNOSIS — E1110 Type 2 diabetes mellitus with ketoacidosis without coma: Secondary | ICD-10-CM | POA: Diagnosis present

## 2018-07-21 DIAGNOSIS — E1010 Type 1 diabetes mellitus with ketoacidosis without coma: Secondary | ICD-10-CM | POA: Diagnosis present

## 2018-07-21 DIAGNOSIS — E869 Volume depletion, unspecified: Secondary | ICD-10-CM | POA: Diagnosis not present

## 2018-07-21 LAB — CBC
HCT: 43.5 % (ref 36.0–46.0)
Hemoglobin: 13.6 g/dL (ref 12.0–15.0)
MCH: 29.1 pg (ref 26.0–34.0)
MCHC: 31.3 g/dL (ref 30.0–36.0)
MCV: 92.9 fL (ref 80.0–100.0)
Platelets: 418 10*3/uL — ABNORMAL HIGH (ref 150–400)
RBC: 4.68 MIL/uL (ref 3.87–5.11)
RDW: 12.5 % (ref 11.5–15.5)
WBC: 12.9 10*3/uL — ABNORMAL HIGH (ref 4.0–10.5)
nRBC: 0 % (ref 0.0–0.2)

## 2018-07-21 LAB — CBC WITH DIFFERENTIAL/PLATELET
Abs Immature Granulocytes: 0.06 10*3/uL (ref 0.00–0.07)
Basophils Absolute: 0.1 10*3/uL (ref 0.0–0.1)
Basophils Relative: 0 %
Eosinophils Absolute: 0.1 10*3/uL (ref 0.0–0.5)
Eosinophils Relative: 1 %
HCT: 44.6 % (ref 36.0–46.0)
Hemoglobin: 13.8 g/dL (ref 12.0–15.0)
Immature Granulocytes: 0 %
Lymphocytes Relative: 11 %
Lymphs Abs: 1.6 10*3/uL (ref 0.7–4.0)
MCH: 29.1 pg (ref 26.0–34.0)
MCHC: 30.9 g/dL (ref 30.0–36.0)
MCV: 94.1 fL (ref 80.0–100.0)
Monocytes Absolute: 0.4 10*3/uL (ref 0.1–1.0)
Monocytes Relative: 3 %
Neutro Abs: 12.1 10*3/uL — ABNORMAL HIGH (ref 1.7–7.7)
Neutrophils Relative %: 85 %
Platelets: 417 10*3/uL — ABNORMAL HIGH (ref 150–400)
RBC: 4.74 MIL/uL (ref 3.87–5.11)
RDW: 12.4 % (ref 11.5–15.5)
WBC: 14.3 10*3/uL — ABNORMAL HIGH (ref 4.0–10.5)
nRBC: 0 % (ref 0.0–0.2)

## 2018-07-21 LAB — BASIC METABOLIC PANEL
ANION GAP: 10 (ref 5–15)
Anion gap: 18 — ABNORMAL HIGH (ref 5–15)
BUN: 27 mg/dL — ABNORMAL HIGH (ref 6–20)
BUN: 20 mg/dL (ref 6–20)
CALCIUM: 9 mg/dL (ref 8.9–10.3)
CO2: 15 mmol/L — AB (ref 22–32)
CO2: 18 mmol/L — ABNORMAL LOW (ref 22–32)
Calcium: 8.4 mg/dL — ABNORMAL LOW (ref 8.9–10.3)
Chloride: 100 mmol/L (ref 98–111)
Chloride: 106 mmol/L (ref 98–111)
Creatinine, Ser: 1.09 mg/dL — ABNORMAL HIGH (ref 0.44–1.00)
Creatinine, Ser: 0.77 mg/dL (ref 0.44–1.00)
GFR calc Af Amer: >60 mL/min (ref >60)
GFR calc Af Amer: >60 mL/min (ref >60)
GFR calc non Af Amer: >60 mL/min (ref >60)
GFR calc non Af Amer: >60 mL/min (ref >60)
GLUCOSE: 164 mg/dL — AB (ref 70–99)
Glucose, Bld: 396 mg/dL — ABNORMAL HIGH (ref 70–99)
Potassium: 4.6 mmol/L (ref 3.5–5.1)
Potassium: 4.6 mmol/L (ref 3.5–5.1)
Sodium: 133 mmol/L — ABNORMAL LOW (ref 135–145)
Sodium: 134 mmol/L — ABNORMAL LOW (ref 135–145)

## 2018-07-21 LAB — GLUCOSE, CAPILLARY
Glucose-Capillary: 145 mg/dL — ABNORMAL HIGH (ref 70–99)
Glucose-Capillary: 152 mg/dL — ABNORMAL HIGH (ref 70–99)
Glucose-Capillary: 158 mg/dL — ABNORMAL HIGH (ref 70–99)
Glucose-Capillary: 169 mg/dL — ABNORMAL HIGH (ref 70–99)
Glucose-Capillary: 181 mg/dL — ABNORMAL HIGH (ref 70–99)
Glucose-Capillary: 279 mg/dL — ABNORMAL HIGH (ref 70–99)
Glucose-Capillary: 388 mg/dL — ABNORMAL HIGH (ref 70–99)

## 2018-07-21 LAB — COMPREHENSIVE METABOLIC PANEL
ALT: 19 U/L (ref 0–44)
AST: 17 U/L (ref 15–41)
Albumin: 4.7 g/dL (ref 3.5–5.0)
Alkaline Phosphatase: 70 U/L (ref 38–126)
Anion gap: 18 — ABNORMAL HIGH (ref 5–15)
BUN: 28 mg/dL — ABNORMAL HIGH (ref 6–20)
CO2: 19 mmol/L — ABNORMAL LOW (ref 22–32)
Calcium: 9.3 mg/dL (ref 8.9–10.3)
Chloride: 93 mmol/L — ABNORMAL LOW (ref 98–111)
Creatinine, Ser: 1.23 mg/dL — ABNORMAL HIGH (ref 0.44–1.00)
GFR calc Af Amer: >60 mL/min (ref >60)
GFR calc non Af Amer: 54 mL/min — ABNORMAL LOW (ref >60)
Glucose, Bld: 581 mg/dL (ref 70–99)
Potassium: 5 mmol/L (ref 3.5–5.1)
Sodium: 130 mmol/L — ABNORMAL LOW (ref 135–145)
Total Bilirubin: 1.7 mg/dL — ABNORMAL HIGH (ref 0.3–1.2)
Total Protein: 8 g/dL (ref 6.5–8.1)

## 2018-07-21 LAB — MAGNESIUM: Magnesium: 2.6 mg/dL — ABNORMAL HIGH (ref 1.7–2.4)

## 2018-07-21 LAB — CBG MONITORING, ED
GLUCOSE-CAPILLARY: 529 mg/dL — AB (ref 70–99)
Glucose-Capillary: 491 mg/dL — ABNORMAL HIGH (ref 70–99)

## 2018-07-21 LAB — MRSA PCR SCREENING: MRSA by PCR: NEGATIVE

## 2018-07-21 MED ORDER — SODIUM CHLORIDE 0.9 % IV SOLN
INTRAVENOUS | Status: DC
  Administered 2018-07-21: 17:00:00 via INTRAVENOUS

## 2018-07-21 MED ORDER — PANTOPRAZOLE SODIUM 40 MG PO TBEC
40.0000 mg | DELAYED_RELEASE_TABLET | Freq: Every day | ORAL | Status: DC
  Administered 2018-07-21 – 2018-07-23 (×3): 40 mg via ORAL
  Filled 2018-07-21 (×3): qty 1

## 2018-07-21 MED ORDER — POTASSIUM CHLORIDE 10 MEQ/100ML IV SOLN
10.0000 meq | INTRAVENOUS | Status: AC
  Administered 2018-07-21: 10 meq via INTRAVENOUS

## 2018-07-21 MED ORDER — ENOXAPARIN SODIUM 40 MG/0.4ML ~~LOC~~ SOLN
40.0000 mg | SUBCUTANEOUS | Status: DC
  Administered 2018-07-22: 40 mg via SUBCUTANEOUS
  Filled 2018-07-21 (×2): qty 0.4

## 2018-07-21 MED ORDER — ACETAMINOPHEN 650 MG RE SUPP: 650.0000 mg | Freq: Four times a day (QID) | RECTAL | Status: DC | PRN

## 2018-07-21 MED ORDER — ONDANSETRON HCL 4 MG PO TABS: 4.0000 mg | ORAL_TABLET | Freq: Four times a day (QID) | ORAL | Status: DC | PRN

## 2018-07-21 MED ORDER — TRAZODONE HCL 50 MG PO TABS: 200.0000 mg | ORAL_TABLET | Freq: Every evening | ORAL | Status: DC | PRN

## 2018-07-21 MED ORDER — GABAPENTIN 100 MG PO CAPS
200.0000 mg | ORAL_CAPSULE | Freq: Three times a day (TID) | ORAL | Status: DC
  Administered 2018-07-21 – 2018-07-23 (×5): 200 mg via ORAL
  Filled 2018-07-21 (×6): qty 2

## 2018-07-21 MED ORDER — LACTATED RINGERS IV BOLUS
2000.0000 mL | Freq: Once | INTRAVENOUS | Status: AC
  Administered 2018-07-21: 2000 mL via INTRAVENOUS

## 2018-07-21 MED ORDER — POLYETHYLENE GLYCOL 3350 17 G PO PACK
17.0000 g | PACK | Freq: Every day | ORAL | Status: DC | PRN
  Administered 2018-07-22: 17 g via ORAL
  Filled 2018-07-21: qty 1

## 2018-07-21 MED ORDER — ZOLPIDEM TARTRATE 10 MG PO TABS
10.0000 mg | ORAL_TABLET | Freq: Every evening | ORAL | Status: DC | PRN
  Administered 2018-07-21 – 2018-07-22 (×2): 10 mg via ORAL
  Filled 2018-07-21 (×2): qty 1

## 2018-07-21 MED ORDER — SODIUM CHLORIDE 0.9 % IV BOLUS: 1000.0000 mL | Freq: Once | INTRAVENOUS | Status: DC

## 2018-07-21 MED ORDER — DULOXETINE HCL 30 MG PO CPEP
30.0000 mg | ORAL_CAPSULE | Freq: Three times a day (TID) | ORAL | Status: DC
  Administered 2018-07-21 – 2018-07-23 (×5): 30 mg via ORAL
  Filled 2018-07-21 (×6): qty 1

## 2018-07-21 MED ORDER — OXYCODONE-ACETAMINOPHEN 5-325 MG PO TABS
1.0000 | ORAL_TABLET | ORAL | Status: DC | PRN
  Administered 2018-07-21 – 2018-07-23 (×7): 1 via ORAL
  Filled 2018-07-21 (×7): qty 1

## 2018-07-21 MED ORDER — ONDANSETRON HCL 4 MG/2ML IJ SOLN
4.0000 mg | Freq: Once | INTRAMUSCULAR | Status: AC
  Administered 2018-07-21: 4 mg via INTRAVENOUS
  Filled 2018-07-21: qty 2

## 2018-07-21 MED ORDER — DEXTROSE-NACL 5-0.45 % IV SOLN: INTRAVENOUS | Status: DC

## 2018-07-21 MED ORDER — AMPHETAMINE-DEXTROAMPHETAMINE 20 MG PO TABS
20.0000 mg | ORAL_TABLET | Freq: Three times a day (TID) | ORAL | Status: DC
  Administered 2018-07-22 – 2018-07-23 (×3): 20 mg via ORAL
  Filled 2018-07-21 (×3): qty 2

## 2018-07-21 MED ORDER — DICYCLOMINE HCL 20 MG PO TABS
20.0000 mg | ORAL_TABLET | Freq: Two times a day (BID) | ORAL | Status: DC
  Filled 2018-07-21 (×4): qty 1

## 2018-07-21 MED ORDER — POTASSIUM CHLORIDE 10 MEQ/100ML IV SOLN: 10.0000 meq | INTRAVENOUS | Status: DC

## 2018-07-21 MED ORDER — POTASSIUM CHLORIDE 10 MEQ/100ML IV SOLN
10.0000 meq | INTRAVENOUS | Status: DC
  Filled 2018-07-21: qty 100

## 2018-07-21 MED ORDER — ONDANSETRON HCL 4 MG/2ML IJ SOLN
4.0000 mg | Freq: Four times a day (QID) | INTRAMUSCULAR | Status: DC | PRN
  Administered 2018-07-22: 4 mg via INTRAVENOUS
  Filled 2018-07-21: qty 2

## 2018-07-21 MED ORDER — MIRTAZAPINE 30 MG PO TABS
45.0000 mg | ORAL_TABLET | Freq: Every day | ORAL | Status: DC
  Administered 2018-07-21 – 2018-07-22 (×2): 45 mg via ORAL
  Filled 2018-07-21 (×2): qty 3

## 2018-07-21 MED ORDER — DEXTROSE-NACL 5-0.45 % IV SOLN
INTRAVENOUS | Status: DC
  Administered 2018-07-21: 20:00:00 via INTRAVENOUS

## 2018-07-21 MED ORDER — CHLORPROMAZINE HCL 50 MG PO TABS
50.0000 mg | ORAL_TABLET | Freq: Two times a day (BID) | ORAL | Status: DC
  Filled 2018-07-21 (×4): qty 1

## 2018-07-21 MED ORDER — ACETAMINOPHEN 325 MG PO TABS
650.0000 mg | ORAL_TABLET | Freq: Four times a day (QID) | ORAL | Status: DC | PRN
  Administered 2018-07-21: 650 mg via ORAL
  Filled 2018-07-21: qty 2

## 2018-07-21 MED ORDER — INSULIN REGULAR(HUMAN) IN NACL 100-0.9 UT/100ML-% IV SOLN
INTRAVENOUS | Status: DC
  Administered 2018-07-21: 3.3 [IU]/h via INTRAVENOUS

## 2018-07-21 MED ORDER — LEVONORGESTREL 20 MCG/24HR IU IUD: 1.0000 | INTRAUTERINE_SYSTEM | Freq: Once | INTRAUTERINE | Status: DC

## 2018-07-21 MED ORDER — SODIUM CHLORIDE 0.9 % IV BOLUS
1000.0000 mL | Freq: Once | INTRAVENOUS | Status: AC
  Administered 2018-07-21: 1000 mL via INTRAVENOUS

## 2018-07-21 MED ORDER — CLONAZEPAM 0.5 MG PO TABS
0.5000 mg | ORAL_TABLET | Freq: Three times a day (TID) | ORAL | Status: DC | PRN
  Administered 2018-07-23: 0.5 mg via ORAL
  Filled 2018-07-21 (×2): qty 1

## 2018-07-21 MED ORDER — INSULIN REGULAR(HUMAN) IN NACL 100-0.9 UT/100ML-% IV SOLN
INTRAVENOUS | Status: DC
  Administered 2018-07-21: 4.3 [IU]/h via INTRAVENOUS
  Filled 2018-07-21: qty 100

## 2018-07-21 NOTE — ED Notes (Signed)
Bed: WG66 Expected date:  Expected time:  Means of arrival:  Comments: Lopezperez from Triage

## 2018-07-21 NOTE — ED Notes (Signed)
Date and time results received: 07/21/18 2:51 PM   Test: glucose Critical Value: 581  Name of Provider Notified: Juleen China MD  Orders Received? Or Actions Taken?: made aware; treatment already in process; see MAR

## 2018-07-21 NOTE — ED Provider Notes (Signed)
[facility]-EMERGENCY DEPT Provider Note   CSN: [id number] Arrival date & time: 07/21/18  1314     History   Chief Complaint Chief Complaint  Patient presents with  . Hyperglycemia  . Emesis    HPI [name] is a 43 y.o. female.  HPI   43 year old female with nausea and vomiting.  Hyperglycemia.  Symptoms began yesterday progressing throughout the day today.  States that she cannot keep anything down.  Denies any significant abdominal pain.  No diarrhea.  No urinary complaints.  She is a known diabetic and has an insulin pump.  She reports compliance with medications.  No fevers.  Past Medical History:  Diagnosis Date  . Adopted   . Cluster headache syndrome, intractable   . Depression   . Migraine without aura, with intractable migraine, so stated, without mention of status migrainosus 12/05/2012  . Ovarian cyst   . STD (sexually transmitted disease)    HSV II   . Type I diabetes mellitus [facility])     Patient Active Problem List   Diagnosis Date Noted  . Volume depletion 07/21/2018  . Morbid obesity (HCC) 06/16/2018  . Chronic tension-type headache, intractable 06/03/2018  . Multiple thyroid nodules 04/17/2018  . 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 DKA, type 1 (HCC) 02/14/2015  . DKA (diabetic ketoacidoses) (HCC) 02/14/2015  . Thyroid nodule 02/14/2015  . Depression with anxiety 02/14/2015  . Acute kidney injury (HCC) 02/14/2015  . Leukocytosis 02/14/2015  . Increased anion gap metabolic acidosis 02/14/2015  . History of frequent urinary tract infections 01/25/2014  . Anxiety 12/04/2013  . Neuropathic pain 12/04/2013  . Recurrent UTI 12/04/2013  . Aphakia of left eye 06/23/2013  . Retinal detachment 06/23/2013  . PDR (proliferative diabetic retinopathy) (HCC) 06/23/2013  . Recurrent major depression-severe (HCC) 01/06/2013  . Generalized anxiety disorder 01/06/2013  . Common migraine with intractable  migraine 12/05/2012  . IDDM (insulin dependent diabetes mellitus) (HCC) 12/05/2012  . RLQ abdominal pain 11/30/2011    Past Surgical History:  Procedure Laterality Date  . APPENDECTOMY    . BUNIONECTOMY Bilateral   . CHOLECYSTECTOMY    . EYE SURGERY    . OVARIAN CYST REMOVAL       OB History    Gravida  0   Para  0   Term  0   Preterm  0   AB  0   Living  0     SAB  0   TAB  0   Ectopic  0   Multiple  0   Live Births  0            Home Medications    Prior to Admission medications   Medication Sig Start Date End Date Taking? Authorizing Provider  amphetamine-dextroamphetamine (ADDERALL) 20 MG tablet Take 20 mg by mouth 3 (three) times daily.  10/24/17  Yes [provider]  chlorproMAZINE (THORAZINE) 50 MG tablet Take 50 mg by mouth 2 (two) times daily. 06/06/18  Yes [provider]  clonazePAM (KLONOPIN) 0.5 MG tablet Take 0.5 mg by mouth 3 (three) times daily as needed for anxiety. For anxiety 01/09/13  Yes Thermon Leyland, NP  dicyclomine (BENTYL) 20 MG tablet Take 1 tablet (20 mg total) by mouth 2 (two) times daily. 04/01/18  Yes Charlynne Pander, MD  DULoxetine (CYMBALTA) 30 MG capsule Take 30 mg by mouth 3 (three) times daily. 07/14/18  Yes [provider]  Darlys Gales (  AJOVY) 225 MG/1.5ML SOSY Inject 1 pen into the skin every 30 (thirty) days.   Yes [provider]  gabapentin (NEURONTIN) 300 MG capsule Take 300 mg by mouth 3 (three) times daily.  06/04/18 07/21/18 Yes [provider]  ibuprofen (ADVIL,MOTRIN) 200 MG tablet Take 400-600 mg by mouth every 6 (six) hours as needed for fever or mild pain.   Yes [provider]  Insulin Aspart, w/Niacinamide, (FIASP FLEXTOUCH) 100 UNIT/ML SOPN Inject 1-10 Units into the skin 3 (three) times daily with meals. Per sliding scale    Yes [provider]  insulin degludec (TRESIBA FLEXTOUCH) 100 UNIT/ML SOPN FlexTouch Pen Inject 20 Units into the skin  at bedtime.    Yes [provider]  levonorgestrel (MIRENA) 20 MCG/24HR IUD 1 each by Intrauterine route once. Placed 06/2014   Yes [provider]  mirtazapine (REMERON) 15 MG tablet Take 45 mg by mouth at bedtime.  06/27/18  Yes [provider]  pantoprazole (PROTONIX) 40 MG tablet Take 40 mg by mouth daily. 06/30/18  Yes [provider]  traZODone (DESYREL) 50 MG tablet Take 200 mg by mouth at bedtime as needed for sleep.  10/08/17  Yes [provider]  zolpidem (AMBIEN) 10 MG tablet Take 10 mg by mouth at bedtime as needed for sleep.    Yes [provider]  dexamethasone (DECADRON) 2 MG tablet Take 3 tablets the first day, 2 the second and 1 the third day Patient not taking: Reported on 07/18/2018 12/12/17   York Spaniel, MD  mirtazapine (REMERON) 7.5 MG tablet Take by mouth. 06/04/18 07/18/18  [provider]  polyethylene glycol (MIRALAX / GLYCOLAX) packet Take 17 g by mouth 2 (two) times daily. Patient not taking: Reported on 07/21/2018 04/01/18   Charlynne Pander, MD  traZODone (DESYREL) 150 MG tablet Take 150 mg by mouth at bedtime as needed.  06/04/18 07/04/18  [provider]    Family History Family History  Adopted: Yes  Problem Relation Age of Onset  . Diabetes Father     Social History Social History   Tobacco Use  . Smoking status: Never Smoker  . Smokeless tobacco: Never Used  Substance Use Topics  . Alcohol use: Yes    Comment: Once a month   . Drug use: No     Allergies   Gluten meal; Sulfa antibiotics; Wellbutrin [bupropion]; Amoxicillin; and Codeine   Review of Systems Review of Systems  All systems reviewed and negative, other than as noted in HPI.  Physical Exam Updated Vital Signs BP 117/61   Pulse (!) 112   Temp 98.8 F (37.1 C) (Oral)   Resp 19   Ht 5\' 1"  (1.549 m)   Wt 72.6 kg   LMP 07/21/2018   SpO2 97%   BMI 30.23 kg/m   Physical Exam Vitals signs and nursing note  reviewed.  Constitutional:      Appearance: She is well-developed.     Comments: Laying in bed.  Appears tired, but not toxic.  HENT:     Head: Normocephalic and atraumatic.  Eyes:     General:        Right eye: No discharge.        Left eye: No discharge.     Conjunctiva/sclera: Conjunctivae normal.  Neck:     Musculoskeletal: Neck supple.  Cardiovascular:     Rate and Rhythm: Regular rhythm. Tachycardia present.     Heart sounds: Normal heart sounds. No murmur. No  friction rub. No gallop.   Pulmonary:     Effort: Pulmonary effort is normal. No respiratory distress.     Breath sounds: Normal breath sounds.  Abdominal:     General: There is no distension.     Palpations: Abdomen is soft.     Tenderness: There is no abdominal tenderness.  Musculoskeletal:        General: No tenderness.  Skin:    General: Skin is warm and dry.  Neurological:     Mental Status: She is alert.  Psychiatric:        Behavior: Behavior normal.        Thought Content: Thought content normal.      ED Treatments / Results  Labs (all labs ordered are listed, but only abnormal results are displayed) Labs Reviewed  CBC WITH DIFFERENTIAL/PLATELET - Abnormal; Notable for the following components:      Result Value   WBC 14.3 (*)    Platelets 417 (*)    Neutro Abs 12.1 (*)    All other components within normal limits  COMPREHENSIVE METABOLIC PANEL - Abnormal; Notable for the following components:   Sodium 130 (*)    Chloride 93 (*)    CO2 19 (*)    Glucose, Bld 581 (*)    BUN 28 (*)    Creatinine, Ser 1.23 (*)    Total Bilirubin 1.7 (*)    GFR calc non Af Amer 54 (*)    Anion gap 18 (*)    All other components within normal limits  MAGNESIUM - Abnormal; Notable for the following components:   Magnesium 2.6 (*)    All other components within normal limits  CBG MONITORING, ED - Abnormal; Notable for the following components:   Glucose-Capillary 529 (*)    All other components within normal  limits  CBG MONITORING, ED - Abnormal; Notable for the following components:   Glucose-Capillary 491 (*)    All other components within normal limits  URINALYSIS, ROUTINE W REFLEX MICROSCOPIC  PREGNANCY, URINE    EKG None  Radiology No results found.  Procedures Procedures (including critical care time)  CRITICAL CARE Performed by: Raeford RazorStephen Allaya Abbasi Total critical care time: 35 minutes Critical care time was exclusive of separately billable procedures and treating other patients. Critical care was necessary to treat or prevent imminent or life-threatening deterioration. Critical care was time spent personally by me on the following activities: development of treatment plan with patient and/or surrogate as well as nursing, discussions with consultants, evaluation of patient's response to treatment, examination of patient, obtaining history from patient or surrogate, ordering and performing treatments and interventions, ordering and review of laboratory studies, ordering and review of radiographic studies, pulse oximetry and re-evaluation of patient's condition.   Medications Ordered in ED Medications  insulin regular, human (MYXREDLIN) 100 units/ 100 mL infusion (4.3 Units/hr Intravenous New Bag/Given 07/21/18 1553)  potassium chloride 10 mEq in 100 mL IVPB (has no administration in time range)  dextrose 5 %-0.45 % sodium chloride infusion (has no administration in time range)  sodium chloride 0.9 % bolus 1,000 mL (has no administration in time range)  0.9 %  sodium chloride infusion (has no administration in time range)  lactated ringers bolus 2,000 mL (2,000 mLs Intravenous New Bag/Given 07/21/18 1416)  ondansetron (ZOFRAN) injection 4 mg (4 mg Intravenous Given 07/21/18 1416)     Initial Impression / Assessment and Plan / ED Course  I have reviewed the triage vital signs and the nursing notes.  Pertinent labs & imaging results that were available during my care of the patient were  reviewed by me and considered in my medical decision making (see chart for details).     43 year old female with nausea/vomiting.  Hyperglycemia.  She has some mild DKA.  Unsure as to exact precipitant.  She is afebrile.  No overt infectious symptoms.  UA still pending though.  She reports compliance with her medicines.  She was bolused with IV fluids.  Will continue.  Insulin drip.  Admission for ongoing treatment.  Final Clinical Impressions(s) / ED Diagnoses   Final diagnoses:  Diabetic ketoacidosis without coma associated with other specified diabetes mellitus [facility](HCC)    ED Discharge Orders    None       Raeford RazorKohut, Greer Koe[place], MD 07/21/18 [phone]

## 2018-07-21 NOTE — H&P (Addendum)
[facility] History and Physical  [name] IHK:[id number] DOB: 12/07/1975 DOA: 07/21/2018  Referring physician: ED  PCP: Jordan Hawks, PA-C   Chief Complaint: Nausea vomiting  HPI: [name] is a 43 y.o. female with past medical history of migraine, depression, type 1 diabetes on insulin pump presented to the hospital with nausea vomiting progressively getting worse for 1 day.  She stated that she was unable to keep anything down.  She does have insulin pump and reports compliance with medication.  Denies any recent illnesses.  Patient denies any abdominal pain diarrhea or constipation.  Denies any sick contacts or recent travel.  Denies urinary urgency, frequency or dysuria.  Patient denies any changes in her medication.  She has seen her endocrinologist Crista Curb as outpatient 2 weeks prior.  She does have a history of depression and is currently on multiple medications including treatment at [facility].  Complains of mild headache with her history of migraine.  Patient takes from [place] monthly injection for migraine.  ED Course: In the ED, patient was noted to have a blood glucose level of 581, creatinine of 1.2, sodium of 130, WBC elevated at 14.3.  Anion gap was 18.  Patient was given 3 L of IV fluid bolus and was started on insulin drip.  Hospitalist team was then consulted for admission to the hospital.  Review of Systems:  All systems were reviewed and were negative unless otherwise mentioned in the HPI  Past Medical History:  Diagnosis Date  . Adopted   . Cluster headache syndrome, intractable   . Depression   . Migraine without aura, with intractable migraine, so stated, without mention of status migrainosus 12/05/2012  . Ovarian cyst   . STD (sexually transmitted disease)    HSV II   . Type I diabetes mellitus (HCC)    Past Surgical History:  Procedure Laterality Date  . APPENDECTOMY    . BUNIONECTOMY Bilateral   .  CHOLECYSTECTOMY    . EYE SURGERY    . OVARIAN CYST REMOVAL      Social History:  reports that she has never smoked. She has never used smokeless tobacco. She reports current alcohol use. She reports that she does not use drugs.  Allergies  Allergen Reactions  . Gluten Meal Other (See Comments)    abdominal cramps  . Sulfa Antibiotics Nausea And Vomiting  . Wellbutrin [Bupropion] Other (See Comments)    "Makes me suicidal."  . Amoxicillin Rash    Has patient had a PCN reaction causing immediate rash, facial/tongue/throat swelling, SOB or lightheadedness with hypotension: Yes Has patient had a PCN reaction causing severe rash involving mucus membranes or skin necrosis: No Has patient had a PCN reaction that required hospitalization: No Has patient had a PCN reaction occurring within the last 10 years: No If all of the above answers are "NO", then may proceed with Cephalosporin use.   . Codeine Rash    Cannot take just codeine. Able to tolerate derivatives such as hydrocodone    Family History  Adopted: Yes  Problem Relation Age of Onset  . Diabetes Father      Prior to Admission medications   Medication Sig Start Date End Date Taking? Authorizing Provider  amphetamine-dextroamphetamine (ADDERALL) 20 MG tablet Take 20 mg by mouth 3 (three) times daily.  10/24/17  Yes [provider]  chlorproMAZINE (THORAZINE) 50 MG tablet Take 50 mg by mouth 2 (two) times daily. 06/06/18  Yes [provider]  clonazePAM (KLONOPIN) 0.5 MG tablet Take 0.5 mg by mouth 3 (three) times daily as needed for anxiety. For anxiety 01/09/13  Yes Thermon Leyland, NP  dicyclomine (BENTYL) 20 MG tablet Take 1 tablet (20 mg total) by mouth 2 (two) times daily. 04/01/18  Yes Charlynne Pander, MD  DULoxetine (CYMBALTA) 30 MG capsule Take 30 mg by mouth 3 (three) times daily. 07/14/18  Yes [provider]  Fremanezumab-vfrm (AJOVY) 225 MG/1.5ML SOSY Inject 1 pen into the skin every 30  (thirty) days.   Yes [provider]  gabapentin (NEURONTIN) 300 MG capsule Take 300 mg by mouth 3 (three) times daily.  06/04/18 07/21/18 Yes [provider]  ibuprofen (ADVIL,MOTRIN) 200 MG tablet Take 400-600 mg by mouth every 6 (six) hours as needed for fever or mild pain.   Yes [provider]  Insulin Aspart, w/Niacinamide, (FIASP FLEXTOUCH) 100 UNIT/ML SOPN Inject 1-10 Units into the skin 3 (three) times daily with meals. Per sliding scale    Yes [provider]  insulin degludec (TRESIBA FLEXTOUCH) 100 UNIT/ML SOPN FlexTouch Pen Inject 20 Units into the skin at bedtime.    Yes [provider]  levonorgestrel (MIRENA) 20 MCG/24HR IUD 1 each by Intrauterine route once. Placed 06/2014   Yes [provider]  mirtazapine (REMERON) 15 MG tablet Take 45 mg by mouth at bedtime.  06/27/18  Yes [provider]  pantoprazole (PROTONIX) 40 MG tablet Take 40 mg by mouth daily. 06/30/18  Yes [provider]  traZODone (DESYREL) 50 MG tablet Take 200 mg by mouth at bedtime as needed for sleep.  10/08/17  Yes [provider]  zolpidem (AMBIEN) 10 MG tablet Take 10 mg by mouth at bedtime as needed for sleep.    Yes [provider]  dexamethasone (DECADRON) 2 MG tablet Take 3 tablets the first day, 2 the second and 1 the third day Patient not taking: Reported on 07/18/2018 12/12/17   York Spaniel, MD  mirtazapine (REMERON) 7.5 MG tablet Take by mouth. 06/04/18 07/18/18  [provider]  polyethylene glycol (MIRALAX / GLYCOLAX) packet Take 17 g by mouth 2 (two) times daily. Patient not taking: Reported on 07/21/2018 04/01/18   Charlynne Pander, MD  traZODone (DESYREL) 150 MG tablet Take 150 mg by mouth at bedtime as needed.  06/04/18 07/04/18  [provider]    Physical Exam: Vitals:   07/21/18 1334 07/21/18 1400 07/21/18 1459  BP: 126/85 121/64 117/61  Pulse: (!) 105 (!) 107 (!) 112  Resp: 20 15 19     Temp: 98.8 F (37.1 C)    TempSrc: Oral    SpO2: 100% 97% 97%  Weight: 72.6 kg    Height: 5\' 1"  (1.549 m)     Wt Readings from Last 3 Encounters:  07/21/18 72.6 kg  03/21/18 70.3 kg  01/05/18 69.9 kg   Body mass index is 30.23 kg/m.  General:  Average built, not in obvious distress, obese HENT: Normocephalic, pupils equally reacting to light and accommodation.  No scleral pallor or icterus noted. Oral mucosa is moist.  Chest:  Clear breath sounds.  Diminished breath sounds bilaterally. No crackles or wheezes.  CVS: S1 &S2 heard. No murmur.  Mildly tachycardic.  Regular rate and rhythm. Abdomen: Soft, nontender, nondistended.  Bowel sounds are heard.  Liver is not palpable, no abdominal mass palpated Extremities: No cyanosis, clubbing or edema.  Peripheral pulses are palpable. Psych: Alert, awake and oriented, depressed mood CNS:  No  cranial nerve deficits.  Power equal in all extremities.   No cerebellar signs.   Skin: Warm and dry.  No rashes noted.  Labs on Admission:   CBC: Recent Labs  Lab 07/21/18 1407  WBC 14.3*  NEUTROABS 12.1*  HGB 13.8  HCT 44.6  MCV 94.1  PLT 417*    Basic Metabolic Panel: Recent Labs  Lab 07/21/18 1407  NA 130*  K 5.0  CL 93*  CO2 19*  GLUCOSE 581*  BUN 28*  CREATININE 1.23*  CALCIUM 9.3  MG 2.6*    Liver Function Tests: Recent Labs  Lab 07/21/18 1407  AST 17  ALT 19  ALKPHOS 70  BILITOT 1.7*  PROT 8.0  ALBUMIN 4.7   No results for input(s): LIPASE, AMYLASE in the last 168 hours. No results for input(s): AMMONIA in the last 168 hours.  Cardiac Enzymes: No results for input(s): CKTOTAL, CKMB, CKMBINDEX, TROPONINI in the last 168 hours.  BNP (last 3 results) No results for input(s): BNP in the last 8760 hours.  ProBNP (last 3 results) No results for input(s): PROBNP in the last 8760 hours.  CBG: Recent Labs  Lab 07/16/18 0901 07/16/18 1028 07/18/18 0910 07/18/18 1028 07/21/18 1333  GLUCAP 167* 162* 117*  130* 529*    Lipase     Component Value Date/Time   LIPASE 25 04/01/2018 0741   LIPASE 43 (L) 11/09/2013 0841     Urinalysis    Component Value Date/Time   COLORURINE COLORLESS (A) 06/30/2018 1544   APPEARANCEUR CLEAR (A) 06/30/2018 1544   APPEARANCEUR Hazy 11/24/2013 1449   LABSPEC 1.000 (L) 06/30/2018 1544   LABSPEC 1.003 11/24/2013 1449   PHURINE 7.0 06/30/2018 1544   GLUCOSEU >=500 (A) 06/30/2018 1544   GLUCOSEU >=500 11/24/2013 1449   HGBUR NEGATIVE 06/30/2018 1544   BILIRUBINUR NEGATIVE 06/30/2018 1544   BILIRUBINUR Negative 11/24/2013 1449   KETONESUR NEGATIVE 06/30/2018 1544   PROTEINUR NEGATIVE 06/30/2018 1544   UROBILINOGEN 0.2 02/14/2015 1557   NITRITE NEGATIVE 06/30/2018 1544   LEUKOCYTESUR NEGATIVE 06/30/2018 1544   LEUKOCYTESUR 1+ 11/24/2013 1449     Drugs of Abuse     Component Value Date/Time   LABOPIA NONE DETECTED 01/03/2013 1323   COCAINSCRNUR NONE DETECTED 01/03/2013 1323   LABBENZ NONE DETECTED 01/03/2013 1323   AMPHETMU NONE DETECTED 01/03/2013 1323   THCU NONE DETECTED 01/03/2013 1323   LABBARB NONE DETECTED 01/03/2013 1323      Radiological Exams on Admission: No results found.  EKG: Personally reviewed by me which shows normal sinus rhythm  Assessment/Plan Principal Problem:   DKA, type 1 (HCC) Active Problems:   IDDM (insulin dependent diabetes mellitus) (HCC)   Depression with anxiety   Acute kidney injury (HCC)   Leukocytosis   Volume depletion  Mild diabetic ketoacidosis, type 1 diabetes mellitus with mildly elevated gap.  She received 3 L of IV fluid hydration in the ED.  We will follow DKA protocol with insulin drip.  Check urinalysis, chest x-ray.  Patient has been started on insulin drip in the ED.  Will admit patient in stepdown unit.  No obvious precipitating factors noted so far.  Patient does have a insulin pump.  Likely patient might need outpatient follow-up for closer monitoring of her pump to ensure that she is getting  adequate doses.  Leukocytosis likely reactive.  Will monitor CBC in a.m.  Volume depletion.  Continue IV fluid hydration.  Mild acute kidney injury likely secondary to volume depletion.  Will decrease the  dose of Neurontin.  Continue IV fluid hydration.  Closely monitor.  Hold off Advil  History of depression and anxiety.  On Thorazine, duloxetine, mirtazapine, Xanax.  Patient goes to [facility] for outpatient treatment of her depression (ECT).  Continue all of her medications.   Consultant: None  Code Status: Full code  DVT Prophylaxis: Lovenox  Antibiotics: None  Family Communication: No one available at bedside.  Patients' condition and plan of care including tests being ordered have been discussed with the patient who indicate understanding and agree with the plan.  Disposition Plan: Home  Severity of Illness: The appropriate patient status for this patient is OBSERVATION. Observation status is judged to be reasonable and necessary in order to provide the required intensity of service to ensure the patient's safety. The patient's presenting symptoms, physical exam findings, and initial radiographic and laboratory data in the context of their medical condition is felt to place them at decreased risk for further clinical deterioration. Furthermore, it is anticipated that the patient will be medically stable for discharge from the hospital within 2 midnights of admission.  Signed, Joycelyn DasLaxman Morningstar Toft, MD [facility] 07/21/2018

## 2018-07-21 NOTE — ED Triage Notes (Signed)
Patient is a diabetic and reports her blood sugar at home is reading above 600mg /dL since last night. When entering the room, patient was vomiting a large amount of emesis. She reports starting vomiting in the middle of the night. Patient denies any pain or reason for the high blood sugar.

## 2018-07-21 NOTE — ED Notes (Signed)
ED TO INPATIENT HANDOFF REPORT  Name/Age/Gender [name] 43 y.o. female  Code Status Code Status History    Date Active Date Inactive Code Status Order ID Comments User Context   02/14/2015 2030 02/17/2015 1421 Full Code [id number]  Edsel Petrin, DO Inpatient   01/05/2013 1802 01/09/2013 1941 Full Code [id number]  Earney Navy, NP Inpatient   01/03/2013 1434 01/05/2013 1536 Full Code [id number]  Hurman Horn, MD ED   12/05/2012 2051 12/08/2012 1419 Full Code [id number]  Philip Aspen, Limmie Patricia, MD Inpatient      Home/SNF/Other Home  Chief Complaint hyperglycemia;emesis  Level of Care/Admitting Diagnosis ED Disposition    ED Disposition Condition Comment   Admit  Hospital Area: [facility] [[id number]]  Level of Care: Stepdown [14]  Admit to SDU based on following criteria: Other see comments  Comments: Insulin drip  Diagnosis: Diabetic ketoacidosis [facility]) [401027]  Admitting Physician: Joycelyn Das [[id number]]  Attending Physician: Joycelyn Das [[id number]]  PT Class (Do Not Modify): Observation [104]  PT Acc Code (Do Not Modify): Observation [10022]       Medical History Past Medical History:  Diagnosis Date  . Adopted   . Cluster headache syndrome, intractable   . Depression   . Migraine without aura, with intractable migraine, so stated, without mention of status migrainosus 12/05/2012  . Ovarian cyst   . STD (sexually transmitted disease)    HSV II   . Type I diabetes mellitus (HCC)     Allergies Allergies  Allergen Reactions  . Gluten Meal Other (See Comments)    abdominal cramps  . Sulfa Antibiotics Nausea And Vomiting  . Wellbutrin [Bupropion] Other (See Comments)    "Makes me suicidal."  . Amoxicillin Rash    Has patient had a PCN reaction causing immediate rash, facial/tongue/throat swelling, SOB or lightheadedness with hypotension: Yes Has patient had a PCN reaction causing severe rash involving mucus membranes or skin necrosis:  No Has patient had a PCN reaction that required hospitalization: No Has patient had a PCN reaction occurring within the last 10 years: No If all of the above answers are "NO", then may proceed with Cephalosporin use.   . Codeine Rash    Cannot take just codeine. Able to tolerate derivatives such as hydrocodone    IV Location/Drains/Wounds Patient Lines/Drains/Airways Status   Active Line/Drains/Airways    Name:   Placement date:   Placement time:   Site:   Days:   Peripheral IV 07/21/18 Right Antecubital   07/21/18    1358    Antecubital   less than 1   Peripheral IV 07/21/18 Left Hand   07/21/18    -    Hand   less than 1   Airway   07/14/18    1135     7          Labs/Imaging Results for orders placed or performed during the hospital encounter of 07/21/18 (from the past 48 hour(s))  CBG monitoring, ED     Status: Abnormal   Collection Time: 07/21/18  1:33 PM  Result Value Ref Range   Glucose-Capillary 529 (HH) 70 - 99 mg/dL  CBC with Differential     Status: Abnormal   Collection Time: 07/21/18  2:07 PM  Result Value Ref Range   WBC 14.3 (H) 4.0 - 10.5 K/uL   RBC 4.74 3.87 - 5.11 MIL/uL   Hemoglobin 13.8 12.0 - 15.0 g/dL   HCT 95.6 38.7 - 56.4 %  MCV 94.1 80.0 - 100.0 fL   MCH 29.1 26.0 - 34.0 pg   MCHC 30.9 30.0 - 36.0 g/dL   RDW 16.112.4 09.611.5 - 04.515.5 %   Platelets 417 (H) 150 - 400 K/uL   nRBC 0.0 0.0 - 0.2 %   Neutrophils Relative % 85 %   Neutro Abs 12.1 (H) 1.7 - 7.7 K/uL   Lymphocytes Relative 11 %   Lymphs Abs 1.6 0.7 - 4.0 K/uL   Monocytes Relative 3 %   Monocytes Absolute 0.4 0.1 - 1.0 K/uL   Eosinophils Relative 1 %   Eosinophils Absolute 0.1 0.0 - 0.5 K/uL   Basophils Relative 0 %   Basophils Absolute 0.1 0.0 - 0.1 K/uL   Immature Granulocytes 0 %   Abs Immature Granulocytes 0.06 0.00 - 0.07 K/uL    Comment: Performed at [facility], [address] [place], [place] [address]  Comprehensive metabolic panel     Status: Abnormal    Collection Time: 07/21/18  2:07 PM  Result Value Ref Range   Sodium 130 (L) 135 - 145 mmol/L   Potassium 5.0 3.5 - 5.1 mmol/L   Chloride 93 (L) 98 - 111 mmol/L   CO2 19 (L) 22 - 32 mmol/L   Glucose, Bld 581 (HH) 70 - 99 mg/dL    Comment: CRITICAL RESULT CALLED TO, READ BACK BY AND VERIFIED WITH: MORGAN ROSSER,RN [id number] @ 1452 BY J SCOTTON    BUN 28 (H) 6 - 20 mg/dL   Creatinine, Ser 1.911.23 (H) 0.44 - 1.00 mg/dL   Calcium 9.3 8.9 - 47.810.3 mg/dL   Total Protein 8.0 6.5 - 8.1 g/dL   Albumin 4.7 3.5 - 5.0 g/dL   AST 17 15 - 41 U/L   ALT 19 0 - 44 U/L   Alkaline Phosphatase 70 38 - 126 U/L   Total Bilirubin 1.7 (H) 0.3 - 1.2 mg/dL   GFR calc non Af Amer 54 (L) >60 mL/min   GFR calc Af Amer >60 >60 mL/min   Anion gap 18 (H) 5 - 15    Comment: Performed at [facility], [address] [place], [place] [address]  Magnesium     Status: Abnormal   Collection Time: 07/21/18  2:07 PM  Result Value Ref Range   Magnesium 2.6 (H) 1.7 - 2.4 mg/dL    Comment: Performed at [facility], [address] [place], [place] [address]  CBG monitoring, ED     Status: Abnormal   Collection Time: 07/21/18  3:49 PM  Result Value Ref Range   Glucose-Capillary 491 (H) 70 - 99 mg/dL   No results found. None  Pending Labs Unresulted Labs (From admission, onward)    Start     Ordered   07/21/18 1407  Urinalysis, Routine w reflex microscopic  Once,   R     07/21/18 1409   07/21/18 1407  Pregnancy, urine  ONCE - STAT,   STAT     07/21/18 1409   Signed and Held  HIV antibody (Routine Testing)  Once,   R     Signed and Held   Signed and Held  CBC  (enoxaparin (LOVENOX)    CrCl < 30 ml/min)  Once,   R    Comments:  Baseline for enoxaparin therapy IF NOT ALREADY DRAWN.  Notify MD if PLT < 100 K.    Signed and Held   Signed and Held  Creatinine, serum  (enoxaparin (LOVENOX)  CrCl < 30 ml/min)  Once,   R    Comments:  Baseline for enoxaparin therapy IF NOT ALREADY  DRAWN.    Signed and Held   Signed and Held  Creatinine, serum  (enoxaparin (LOVENOX)    CrCl < 30 ml/min)  Weekly,   R    Comments:  while on enoxaparin therapy.    Signed and Held   Signed and Held  CBC  Tomorrow morning,   R     Signed and Held          Vitals/Pain Today's Vitals   07/21/18 1459 07/21/18 1500 07/21/18 1530 07/21/18 1600  BP: 117/61 (!) 111/54 (!) 123/56 (!) 115/45  Pulse: (!) 112 (!) 117 (!) 113 (!) 113  Resp: 19   16  Temp:      TempSrc:      SpO2: 97% 97% 100% 99%  Weight:      Height:      PainSc:        Isolation Precautions No active isolations  Medications Medications  insulin regular, human (MYXREDLIN) 100 units/ 100 mL infusion (4.3 Units/hr Intravenous New Bag/Given 07/21/18 1553)  potassium chloride 10 mEq in 100 mL IVPB (has no administration in time range)  dextrose 5 %-0.45 % sodium chloride infusion (has no administration in time range)  sodium chloride 0.9 % bolus 1,000 mL (has no administration in time range)  0.9 %  sodium chloride infusion (has no administration in time range)  lactated ringers bolus 2,000 mL (2,000 mLs Intravenous New Bag/Given 07/21/18 1416)  ondansetron (ZOFRAN) injection 4 mg (4 mg Intravenous Given 07/21/18 1416)    Mobility walks

## 2018-07-21 NOTE — Anesthesia Postprocedure Evaluation (Signed)
Anesthesia Post Note  Patient: [name]  Procedure(s) Performed: ECT TX  Patient location during evaluation: PACU Anesthesia Type: General Level of consciousness: awake and alert Pain management: pain level controlled Vital Signs Assessment: post-procedure vital signs reviewed and stable Respiratory status: spontaneous breathing, nonlabored ventilation, respiratory function stable and patient connected to nasal cannula oxygen Cardiovascular status: blood pressure returned to baseline and stable Postop Assessment: no apparent nausea or vomiting Anesthetic complications: no     Last Vitals:  Vitals:   07/18/18 1042 07/18/18 1056  BP:  121/82  Pulse: 90 89  Resp: 16 16  Temp: 36.6 C 36.4 C  SpO2: 96%     Last Pain:  Vitals:   07/21/18 1440  TempSrc:   PainSc: 0-No pain                 Lenard Simmer

## 2018-07-22 ENCOUNTER — Other Ambulatory Visit: Payer: Self-pay

## 2018-07-22 DIAGNOSIS — N179 Acute kidney failure, unspecified: Secondary | ICD-10-CM | POA: Diagnosis not present

## 2018-07-22 DIAGNOSIS — E1010 Type 1 diabetes mellitus with ketoacidosis without coma: Secondary | ICD-10-CM | POA: Diagnosis not present

## 2018-07-22 DIAGNOSIS — E119 Type 2 diabetes mellitus without complications: Secondary | ICD-10-CM | POA: Diagnosis not present

## 2018-07-22 DIAGNOSIS — D72829 Elevated white blood cell count, unspecified: Secondary | ICD-10-CM | POA: Diagnosis not present

## 2018-07-22 DIAGNOSIS — Z794 Long term (current) use of insulin: Secondary | ICD-10-CM

## 2018-07-22 LAB — BASIC METABOLIC PANEL
Anion gap: 9 (ref 5–15)
Anion gap: 8 (ref 5–15)
Anion gap: 7 (ref 5–15)
Anion gap: 8 (ref 5–15)
BUN: 18 mg/dL (ref 6–20)
BUN: 15 mg/dL (ref 6–20)
BUN: 12 mg/dL (ref 6–20)
BUN: 10 mg/dL (ref 6–20)
CO2: 18 mmol/L — ABNORMAL LOW (ref 22–32)
CO2: 18 mmol/L — ABNORMAL LOW (ref 22–32)
CO2: 18 mmol/L — ABNORMAL LOW (ref 22–32)
CO2: 18 mmol/L — ABNORMAL LOW (ref 22–32)
CREATININE: 0.65 mg/dL (ref 0.44–1.00)
Calcium: 7.9 mg/dL — ABNORMAL LOW (ref 8.9–10.3)
Calcium: 7.7 mg/dL — ABNORMAL LOW (ref 8.9–10.3)
Calcium: 7.9 mg/dL — ABNORMAL LOW (ref 8.9–10.3)
Calcium: 7.9 mg/dL — ABNORMAL LOW (ref 8.9–10.3)
Chloride: 108 mmol/L (ref 98–111)
Chloride: 108 mmol/L (ref 98–111)
Chloride: 114 mmol/L — ABNORMAL HIGH (ref 98–111)
Chloride: 110 mmol/L (ref 98–111)
Creatinine, Ser: 0.71 mg/dL (ref 0.44–1.00)
Creatinine, Ser: 0.69 mg/dL (ref 0.44–1.00)
Creatinine, Ser: 0.66 mg/dL (ref 0.44–1.00)
GFR calc Af Amer: >60 mL/min (ref >60)
GFR calc Af Amer: >60 mL/min (ref >60)
GFR calc Af Amer: >60 mL/min (ref >60)
GFR calc Af Amer: >60 mL/min (ref >60)
GFR calc non Af Amer: >60 mL/min (ref >60)
GFR calc non Af Amer: >60 mL/min (ref >60)
GFR calc non Af Amer: >60 mL/min (ref >60)
GFR calc non Af Amer: >60 mL/min (ref >60)
GLUCOSE: 154 mg/dL — AB (ref 70–99)
Glucose, Bld: 197 mg/dL — ABNORMAL HIGH (ref 70–99)
Glucose, Bld: 117 mg/dL — ABNORMAL HIGH (ref 70–99)
Glucose, Bld: 243 mg/dL — ABNORMAL HIGH (ref 70–99)
Potassium: 4 mmol/L (ref 3.5–5.1)
Potassium: 4.2 mmol/L (ref 3.5–5.1)
Potassium: 4.2 mmol/L (ref 3.5–5.1)
Potassium: 4.8 mmol/L (ref 3.5–5.1)
Sodium: 135 mmol/L (ref 135–145)
Sodium: 134 mmol/L — ABNORMAL LOW (ref 135–145)
Sodium: 139 mmol/L (ref 135–145)
Sodium: 136 mmol/L (ref 135–145)

## 2018-07-22 LAB — GLUCOSE, CAPILLARY
Glucose-Capillary: 106 mg/dL — ABNORMAL HIGH (ref 70–99)
Glucose-Capillary: 109 mg/dL — ABNORMAL HIGH (ref 70–99)
Glucose-Capillary: 126 mg/dL — ABNORMAL HIGH (ref 70–99)
Glucose-Capillary: 134 mg/dL — ABNORMAL HIGH (ref 70–99)
Glucose-Capillary: 144 mg/dL — ABNORMAL HIGH (ref 70–99)
Glucose-Capillary: 149 mg/dL — ABNORMAL HIGH (ref 70–99)
Glucose-Capillary: 151 mg/dL — ABNORMAL HIGH (ref 70–99)
Glucose-Capillary: 160 mg/dL — ABNORMAL HIGH (ref 70–99)
Glucose-Capillary: 164 mg/dL — ABNORMAL HIGH (ref 70–99)
Glucose-Capillary: 171 mg/dL — ABNORMAL HIGH (ref 70–99)
Glucose-Capillary: 173 mg/dL — ABNORMAL HIGH (ref 70–99)
Glucose-Capillary: 174 mg/dL — ABNORMAL HIGH (ref 70–99)
Glucose-Capillary: 181 mg/dL — ABNORMAL HIGH (ref 70–99)
Glucose-Capillary: 185 mg/dL — ABNORMAL HIGH (ref 70–99)
Glucose-Capillary: 203 mg/dL — ABNORMAL HIGH (ref 70–99)
Glucose-Capillary: 236 mg/dL — ABNORMAL HIGH (ref 70–99)
Glucose-Capillary: 262 mg/dL — ABNORMAL HIGH (ref 70–99)

## 2018-07-22 LAB — URINALYSIS, ROUTINE W REFLEX MICROSCOPIC
BILIRUBIN URINE: NEGATIVE
Glucose, UA: >=500 mg/dL — AB
Ketones, ur: 80 mg/dL — AB
Leukocytes, UA: NEGATIVE
Nitrite: NEGATIVE
Protein, ur: NEGATIVE mg/dL
Specific Gravity, Urine: 1.008 (ref 1.005–1.030)
pH: 5 (ref 5.0–8.0)

## 2018-07-22 LAB — CBC
HCT: 36.3 % (ref 36.0–46.0)
HEMOGLOBIN: 11.4 g/dL — AB (ref 12.0–15.0)
MCH: 29.6 pg (ref 26.0–34.0)
MCHC: 31.4 g/dL (ref 30.0–36.0)
MCV: 94.3 fL (ref 80.0–100.0)
Platelets: 340 10*3/uL (ref 150–400)
RBC: 3.85 MIL/uL — ABNORMAL LOW (ref 3.87–5.11)
RDW: 12.8 % (ref 11.5–15.5)
WBC: 9.7 10*3/uL (ref 4.0–10.5)
nRBC: 0 % (ref 0.0–0.2)

## 2018-07-22 LAB — PREGNANCY, URINE: Preg Test, Ur: NEGATIVE

## 2018-07-22 LAB — HIV ANTIBODY (ROUTINE TESTING W REFLEX): HIV Screen 4th Generation wRfx: NONREACTIVE

## 2018-07-22 MED ORDER — INSULIN ASPART 100 UNIT/ML ~~LOC~~ SOLN
0.0000 [IU] | Freq: Three times a day (TID) | SUBCUTANEOUS | Status: DC
  Administered 2018-07-22 – 2018-07-23 (×2): 5 [IU] via SUBCUTANEOUS
  Administered 2018-07-23: 3 [IU] via SUBCUTANEOUS

## 2018-07-22 MED ORDER — INSULIN GLARGINE 100 UNIT/ML ~~LOC~~ SOLN
10.0000 [IU] | Freq: Two times a day (BID) | SUBCUTANEOUS | Status: DC
  Administered 2018-07-22 (×2): 10 [IU] via SUBCUTANEOUS
  Filled 2018-07-22 (×3): qty 0.1

## 2018-07-22 MED ORDER — INSULIN ASPART 100 UNIT/ML ~~LOC~~ SOLN
6.0000 [IU] | Freq: Three times a day (TID) | SUBCUTANEOUS | Status: DC
  Administered 2018-07-22: 6 [IU] via SUBCUTANEOUS

## 2018-07-22 MED ORDER — DEXTROSE 10 % IV SOLN
INTRAVENOUS | Status: DC
  Administered 2018-07-22 (×2): via INTRAVENOUS
  Filled 2018-07-22 (×3): qty 1000

## 2018-07-22 MED ORDER — INSULIN ASPART 100 UNIT/ML ~~LOC~~ SOLN: 0.0000 [IU] | Freq: Every day | SUBCUTANEOUS | Status: DC

## 2018-07-22 MED ORDER — SODIUM CHLORIDE 0.9 % IV BOLUS
1000.0000 mL | Freq: Once | INTRAVENOUS | Status: AC
  Administered 2018-07-22: 1000 mL via INTRAVENOUS

## 2018-07-22 MED ORDER — LACTATED RINGERS IV BOLUS
1000.0000 mL | Freq: Once | INTRAVENOUS | Status: AC
  Administered 2018-07-22: 1000 mL via INTRAVENOUS

## 2018-07-22 MED ORDER — POTASSIUM CHLORIDE CRYS ER 20 MEQ PO TBCR
20.0000 meq | EXTENDED_RELEASE_TABLET | Freq: Once | ORAL | Status: AC
  Administered 2018-07-22: 20 meq via ORAL
  Filled 2018-07-22: qty 1

## 2018-07-22 NOTE — Progress Notes (Signed)
PROGRESS NOTE    [name]  ZOX:[id number] DOB: 02/14/1976 DOA: 07/21/2018 PCP: Jordan Hawks, PA-C    Brief Narrative:  43 y.o. female with past medical history of migraine, depression, type 1 diabetes on insulin pump presented to the hospital with nausea vomiting progressively getting worse for 1 day.  She stated that she was unable to keep anything down.  She does have insulin pump and reports compliance with medication.  Denies any recent illnesses.  Patient denies any abdominal pain diarrhea or constipation.  Denies any sick contacts or recent travel.  Denies urinary urgency, frequency or dysuria.  Patient denies any changes in her medication.  She has seen her endocrinologist Crista Curb as outpatient 2 weeks prior.  She does have a history of depression and is currently on multiple medications including treatment at [facility].  Complains of mild headache with her history of migraine.  Patient takes from [place] monthly injection for migraine.  ED Course: In the ED, patient was noted to have a blood glucose level of 581, creatinine of 1.2, sodium of 130, WBC elevated at 14.3.  Anion gap was 18.  Patient was given 3 L of IV fluid bolus and was started on insulin drip.  Hospitalist team was then consulted for admission to the hospital.  Assessment & Plan:   Principal Problem:   DKA, type 1 (HCC) Active Problems:   IDDM (insulin dependent diabetes mellitus) (HCC)   Depression with anxiety   Acute kidney injury (HCC)   Leukocytosis   Volume depletion   Diabetic ketoacidosis (HCC)  Diabetic ketoacidosis, type 1 diabetes mellitus with mildly elevated gap.   -Pt with hx of insulin pump with concerns of malfunctioning device -Given aggressive IVF overnight. GAP closed this AM and glucose appears better controlled -Patient remains persistently acidotic -Appreciate input by Diabetic Coordinator. Plan to continue on subq scheduled insulin at time of discharge  and have patient follow up with Endocrine in order to resume insulin pump at that time -Will give trial of transitioning off insulin pump. Cont to monitor closely.  Leukocytosis  -likely reactive. -Labs reviewed. Normalized   Dehydration -given aggressive IVF hydration  Mild acute kidney injury likely secondary to volume depletion.   -Neurontin dose was decreased at time of presentation -Continue IV fluid hydration.  -Avoid nephrotoxic agents  History of depression and anxiety.   -Patient is continued on Thorazine, duloxetine, mirtazapine, Xanax.   -Patient goes to [facility] for outpatient treatment of her depression (ECT).    DVT prophylaxis: Lovenox subQ Code Status: Full Family Communication: Pt in room, family not at bedside Disposition Plan: Uncertain at this time  Consultants:     Procedures:     Antimicrobials: Anti-infectives (From admission, onward)   None       Subjective: Wanting to start eating  Objective: Vitals:   07/22/18 0500 07/22/18 0600 07/22/18 0800 07/22/18 1200  BP: (!) 105/49 123/72    Pulse: 94 85    Resp: 12 15    Temp:   97.6 F (36.4 C) 98.4 F (36.9 C)  TempSrc:   Oral Oral  SpO2: 99% 100%    Weight: 77.7 kg     Height:        Intake/Output Summary (Last 24 hours) at 07/22/2018 1403 Last data filed at 07/22/2018 1220 Gross per 24 hour  Intake 4903.71 ml  Output 2500 ml  Net 2403.71 ml   Filed Weights   07/21/18 1334 07/22/18 0500  Weight:  72.6 kg 77.7 kg    Examination:  General exam: Appears calm and comfortable  Respiratory system: Clear to auscultation. Respiratory effort normal. Cardiovascular system: S1 & S2 heard, RRR Gastrointestinal system: Abdomen is nondistended, soft and nontender. No organomegaly or masses felt. Normal bowel sounds heard. Central nervous system: Alert and oriented. No focal neurological deficits. Extremities: Symmetric 5 x 5 power. Skin: No rashes, lesions    Psychiatry: Judgement and insight appear normal. Mood & affect appropriate.   Data Reviewed: I have personally reviewed following labs and imaging studies  CBC: Recent Labs  Lab 07/21/18 1407 07/21/18 1722 07/22/18 0158  WBC 14.3* 12.9* 9.7  NEUTROABS 12.1*  --   --   HGB 13.8 13.6 11.4*  HCT 44.6 43.5 36.3  MCV 94.1 92.9 94.3  PLT 417* 418* 340   Basic Metabolic Panel: Recent Labs  Lab 07/21/18 1407 07/21/18 1722 07/21/18 2228 07/22/18 0158 07/22/18 0555 07/22/18 1012  NA 130* 133* 134* 135 134* 139  K 5.0 4.6 4.6 4.0 4.2 4.2  CL 93* 100 106 108 108 114*  CO2 19* 15* 18* 18* 18* 18*  GLUCOSE 581* 396* 164* 154* 197* 117*  BUN 28* 27* 20 18 15 12   CREATININE 1.23* 1.09* 0.77 0.71 0.69 0.65  CALCIUM 9.3 9.0 8.4* 7.9* 7.7* 7.9*  MG 2.6*  --   --   --   --   --    GFR: Estimated Creatinine Clearance: 87.4 mL/min (by C-G formula based on SCr of 0.65 mg/dL). Liver Function Tests: Recent Labs  Lab 07/21/18 1407  AST 17  ALT 19  ALKPHOS 70  BILITOT 1.7*  PROT 8.0  ALBUMIN 4.7   No results for input(s): LIPASE, AMYLASE in the last 168 hours. No results for input(s): AMMONIA in the last 168 hours. Coagulation Profile: No results for input(s): INR, PROTIME in the last 168 hours. Cardiac Enzymes: No results for input(s): CKTOTAL, CKMB, CKMBINDEX, TROPONINI in the last 168 hours. BNP (last 3 results) No results for input(s): PROBNP in the last 8760 hours. HbA1C: No results for input(s): HGBA1C in the last 72 hours. CBG: Recent Labs  Lab 07/22/18 0858 07/22/18 1008 07/22/18 1105 07/22/18 1215 07/22/18 1325  GLUCAP 106* 126* 160* 164* 236*   Lipid Profile: No results for input(s): CHOL, HDL, LDLCALC, TRIG, CHOLHDL, LDLDIRECT in the last 72 hours. Thyroid Function Tests: No results for input(s): TSH, T4TOTAL, FREET4, T3FREE, THYROIDAB in the last 72 hours. Anemia Panel: No results for input(s): VITAMINB12, FOLATE, FERRITIN, TIBC, IRON, RETICCTPCT in the  last 72 hours. Sepsis Labs: No results for input(s): PROCALCITON, LATICACIDVEN in the last 168 hours.  Recent Results (from the past 240 hour(s))  MRSA PCR Screening     Status: None   Collection Time: 07/21/18  8:15 PM  Result Value Ref Range Status   MRSA by PCR NEGATIVE NEGATIVE Final    Comment:        The GeneXpert MRSA Assay (FDA approved for NASAL specimens only), is one component of a comprehensive MRSA colonization surveillance program. It is not intended to diagnose MRSA infection nor to guide or monitor treatment for MRSA infections. Performed at [facility], [address] [place], [place] [address]      Radiology Studies: Dg Chest Port 1 View  Result Date: 07/21/2018 CLINICAL DATA:  Nausea and vomiting, DKA EXAM: PORTABLE CHEST 1 VIEW COMPARISON:  06/30/2018 FINDINGS: The heart size and mediastinal contours are within normal limits. Both lungs are clear. The  visualized skeletal structures are unremarkable. IMPRESSION: No active disease. Electronically Signed   By: Alcide Clever M.D.   On: 07/21/2018 17:55    Scheduled Meds: . amphetamine-dextroamphetamine  20 mg Oral TID  . chlorproMAZINE  50 mg Oral BID  . dicyclomine  20 mg Oral BID  . DULoxetine  30 mg Oral TID  . enoxaparin (LOVENOX) injection  40 mg Subcutaneous Q24H  . gabapentin  200 mg Oral TID  . insulin aspart  0-5 Units Subcutaneous QHS  . insulin aspart  0-9 Units Subcutaneous TID WC  . insulin aspart  6 Units Subcutaneous TID WC  . insulin glargine  10 Units Subcutaneous BID  . mirtazapine  45 mg Oral QHS  . pantoprazole  40 mg Oral Daily   Continuous Infusions: . dextrose 100 mL/hr at 07/22/18 1220  . insulin 3.5 Units/hr (07/22/18 1335)     LOS: 0 days   Rickey Barbara, MD [facility] Pager On Amion  If 7PM-7AM, please contact night-coverage 07/22/2018, 2:03 PM

## 2018-07-22 NOTE — Progress Notes (Signed)
Inpatient Diabetes Program Recommendations  AACE/ADA: New Consensus Statement on Inpatient Glycemic Control (2015)  Target Ranges:  Prepandial:   less than 140 mg/dL      Peak postprandial:   less than 180 mg/dL (1-2 hours)      Critically ill patients:  140 - 180 mg/dL   Lab Results  Component Value Date   GLUCAP 106 (H) 07/22/2018   HGBA1C 10.6 (H) 02/15/2015    Review of Glycemic Control  Diabetes history: DM1 Outpatient Diabetes medications: Insulin pump Current orders for Inpatient glycemic control: IV insulin per DKA protocol  Insulin pump: Basal - 0.85 units/H Bolus - 1:7 CHO ratio, CF 30 with goal of 120 HgbA1C on 07/10/2018 is 8.1%.  Spoke with pt about her glucose control PTA. Pt states she's had Med[employer]onic 670G insulin pump since November 2019. States the CGM does not seem to be working properly. Pt previously had a Libre and it was more user friendly. Discussed need to return to Endo to restart pump, as pt is having problems and admitted for DKA. Pt agrees to this.   Inpatient Diabetes Program Recommendations:     Continue with insulin drip until criteria met for discontinuation. Give Lantus 1-2 hours prior to discontinuation of drip.  Lantus 10 units bid Novolog 0-9 units Q4H x 12H, then tidwc and hs Novolog 6 units tidwc for meal coverage insulin.  Will follow closely.  Thank you. Lorenda Peck, RD, LDN, CDE Inpatient Diabetes Coordinator [phone]

## 2018-07-23 DIAGNOSIS — E1010 Type 1 diabetes mellitus with ketoacidosis without coma: Secondary | ICD-10-CM | POA: Diagnosis not present

## 2018-07-23 DIAGNOSIS — N179 Acute kidney failure, unspecified: Secondary | ICD-10-CM | POA: Diagnosis not present

## 2018-07-23 LAB — GLUCOSE, CAPILLARY
Glucose-Capillary: 172 mg/dL — ABNORMAL HIGH (ref 70–99)
Glucose-Capillary: 270 mg/dL — ABNORMAL HIGH (ref 70–99)
Glucose-Capillary: 194 mg/dL — ABNORMAL HIGH (ref 70–99)
Glucose-Capillary: 67 mg/dL — ABNORMAL LOW (ref 70–99)

## 2018-07-23 LAB — BASIC METABOLIC PANEL
Anion gap: 7 (ref 5–15)
Anion gap: 8 (ref 5–15)
BUN: 11 mg/dL (ref 6–20)
BUN: 13 mg/dL (ref 6–20)
CALCIUM: 9.1 mg/dL (ref 8.9–10.3)
CO2: 22 mmol/L (ref 22–32)
CO2: 23 mmol/L (ref 22–32)
Calcium: 8.5 mg/dL — ABNORMAL LOW (ref 8.9–10.3)
Chloride: 109 mmol/L (ref 98–111)
Chloride: 107 mmol/L (ref 98–111)
Creatinine, Ser: 0.72 mg/dL (ref 0.44–1.00)
Creatinine, Ser: 0.71 mg/dL (ref 0.44–1.00)
GFR calc Af Amer: >60 mL/min (ref >60)
GFR calc Af Amer: >60 mL/min (ref >60)
GFR calc non Af Amer: >60 mL/min (ref >60)
Glucose, Bld: 212 mg/dL — ABNORMAL HIGH (ref 70–99)
Glucose, Bld: 142 mg/dL — ABNORMAL HIGH (ref 70–99)
POTASSIUM: 4.6 mmol/L (ref 3.5–5.1)
Potassium: 4.7 mmol/L (ref 3.5–5.1)
SODIUM: 138 mmol/L (ref 135–145)
Sodium: 138 mmol/L (ref 135–145)

## 2018-07-23 MED ORDER — INSULIN ASPART 100 UNIT/ML ~~LOC~~ SOLN
8.0000 [IU] | Freq: Three times a day (TID) | SUBCUTANEOUS | Status: DC
  Administered 2018-07-23 (×2): 8 [IU] via SUBCUTANEOUS

## 2018-07-23 MED ORDER — GABAPENTIN 100 MG PO CAPS: 200.0000 mg | ORAL_CAPSULE | Freq: Three times a day (TID) | ORAL | 0 refills | Status: DC

## 2018-07-23 MED ORDER — INSULIN GLARGINE 100 UNITS/ML SOLOSTAR PEN: 12.0000 [IU] | PEN_INJECTOR | Freq: Every day | SUBCUTANEOUS | 0 refills | Status: DC

## 2018-07-23 MED ORDER — INSULIN GLARGINE 100 UNIT/ML ~~LOC~~ SOLN
12.0000 [IU] | Freq: Two times a day (BID) | SUBCUTANEOUS | Status: DC
  Administered 2018-07-23: 12 [IU] via SUBCUTANEOUS
  Filled 2018-07-23 (×2): qty 0.12

## 2018-07-23 MED ORDER — BLOOD GLUCOSE MONITOR KIT: PACK | 0 refills | Status: DC

## 2018-07-23 MED ORDER — INSULIN ASPART 100 UNIT/ML FLEXPEN: 8.0000 [IU] | PEN_INJECTOR | Freq: Three times a day (TID) | SUBCUTANEOUS | 0 refills | Status: DC

## 2018-07-23 MED ORDER — INSULIN ASPART 100 UNIT/ML ~~LOC~~ SOLN: 8.0000 [IU] | Freq: Three times a day (TID) | SUBCUTANEOUS | Status: DC

## 2018-07-23 NOTE — Progress Notes (Signed)
Patient to transfer to 6E08 report given to receiving nurse Hilton Cork, all questions answered at this time.  Pt. VSS with no s/s of distress noted.  Patient stable at transfer.

## 2018-07-23 NOTE — Discharge Instructions (Signed)
Diabetic Ketoacidosis °Diabetic ketoacidosis is a serious complication of diabetes. This condition develops when there is not enough insulin in the body. Insulin is an hormone that regulates blood sugar levels in the body. Normally, insulin allows glucose to enter the cells in the body. The cells break down glucose for energy. Without enough insulin, the body cannot break down glucose, so it breaks down fats instead. This leads to high blood glucose levels in the body and the production of acids that are called ketones. Ketones are poisonous at high levels. °If diabetic ketoacidosis is not treated, it can cause severe dehydration and can lead to a coma or death. °What are the causes? °This condition develops when a lack of insulin causes the body to break down fats instead of glucose. This may be triggered by: °· Stress on the body. This stress is brought on by an illness. °· Infection. °· Medicines that raise blood glucose levels. °· Not taking diabetes medicine. °· New onset of type 1 diabetes mellitus. °What are the signs or symptoms? °Symptoms of this condition include: °· Fatigue. °· Weight loss. °· Excessive thirst. °· Light-headedness. °· Fruity or sweet-smelling breath. °· Excessive urination. °· Vision changes. °· Confusion or irritability. °· Nausea. °· Vomiting. °· Rapid breathing. °· Abdominal pain. °· Feeling flushed. °How is this diagnosed? °This condition is diagnosed based on your medical history, a physical exam, and blood tests. You may also have a urine test to check for ketones. °How is this treated? °This condition may be treated with: °· Fluid replacement. This may be done to correct dehydration. °· Insulin injections. These may be given through the skin or through an IV tube. °· Electrolyte replacement. Electrolytes are minerals in your blood. Electrolytes such as potassium and sodium may be given in pill form or through an IV tube. °· Antibiotic medicines. These may be prescribed if your  condition was caused by an infection. °Diabetic ketoacidosis is a serious medical condition. You may need emergency treatment in the hospital to monitor your condition. °Follow these instructions at home: °Eating and drinking °· Drink enough fluids to keep your urine clear or pale yellow. °· If you are not able to eat, drink clear fluids in small amounts as you are able. Clear fluids include water, ice chips, fruit juice with water added (diluted), and low-calorie sports drinks. You may also have sugar-free jello or popsicles. °· If you are able to eat, follow your usual diet and drink sugar-free liquids, such as water. °Medicines °· Take over-the-counter and prescription medicines only as told by your health care provider. °· Continue to take insulin and other diabetes medicines as told by your health care provider. °· If you were prescribed an antibiotic, take it as told by your health care provider. Do not stop taking the antibiotic even if you start to feel better. °General instructions ° °· Check your urine for ketones when you are ill and as told by your health care provider. °? If your blood glucose is 240 mg/dL (13.3 mmol/L) or higher, check your urine ketones every 4-6 hours. °· Check your blood glucose every day, as often as told by your health care provider. °? If your blood glucose is high, drink plenty of fluids. This helps to flush out ketones. °? If your blood glucose is above your target for 2 tests in a row, contact your health care provider. °· Carry a medical alert card or wear medical alert jewelry that says that you have diabetes. °· Rest   and exercise only as told by your health care provider. Do not exercise when your blood glucose is high and you have ketones in your urine.  If you get sick, call your health care provider and begin treatment quickly. Your body often needs extra insulin to fight an illness. Check your blood glucose every 4-6 hours when you are sick.  Keep all follow-up  visits as told by your health care provider. This is important. Contact a health care provider if:  Your blood glucose level is higher than 240 mg/dL (95.2 mmol/L) for 2 days in a row.  You have moderate or large ketones in your urine.  You have a fever.  You cannot eat or drink without vomiting.  You have been vomiting for more than 2 hours.  You continue to have symptoms of diabetic ketoacidosis.  You develop new symptoms. Get help right away if:  Your blood glucose monitor reads high even when you are taking insulin.  You faint.  You have chest pain.  You have trouble breathing.  You have sudden trouble speaking or swallowing.  You have vomiting or diarrhea that gets worse after 3 hours.  You are unable to stay awake.  You have trouble thinking.  You are severely dehydrated. Symptoms of severe dehydration include: ? Extreme thirst. ? Dry mouth. ? Rapid breathing. These symptoms may represent a serious problem that is an emergency. Do not wait to see if the symptoms will go away. Get medical help right away. Call your local emergency services (911 in the [place] Do not drive yourself to the hospital. Summary  Diabetic ketoacidosis is a serious complication of diabetes. This condition develops when there is not enough insulin in the body.  This condition is diagnosed based on your medical history, a physical exam, and blood tests. You may also have a urine test to check for ketones.  Diabetic ketoacidosis is a serious medical condition. You may need emergency treatment in the hospital to monitor your condition.  Contact your health care provider if your blood glucose is higher than 240 mg/dl for 2 days in a row or if you have moderate or large ketones in your urine. This information is not intended to replace advice given to you by your health care provider. Make sure you discuss any questions you have with your health care provider. Document Released: 06/15/2000  Document Revised: 07/23/2016 Document Reviewed: 07/23/2016 Elsevier Interactive Patient Education  2019 [employer].   Tips for Eating Away From Home If You Have Diabetes Controlling your blood sugar (glucose) levels can be challenging when you do not prepare your own meals. The following tips can help you manage your diabetes when you eat away from home. If you have questions or if you need help, work with your health care provider or diet and nutrition specialist (dietitian). Planning ahead Plan ahead if you know you will be eating away from home:  Try to eat your meals and snacks at about the same time each day. If you know your meal is going to be later than normal, make sure you have a small snack. Being very hungry can cause you to make unhealthy food choices.  Make a list of restaurants near you that offer healthy choices. If a restaurant has a carry-out menu, take the menu home and plan what you will order ahead of time.  Look up the restaurant you want to eat at online. Many chain and fast-food restaurants list nutritional information online. Use this information  to choose the healthiest options and to calculate how many carbohydrates will be in your meal.  Use a carbohydrate-counting book or mobile app to look up the carbohydrate content and serving size of the foods you want to eat. Free foods A "free food" is any food or drink that has less than 5 grams of carbohydrates and less than 20 calories per serving. These food are high in fiber and nutrients and low in calories, carbohydrates, and fats. Free foods include:  Non-starchy vegetables, such as carrots, broccoli, celery, lettuce, or green beans.  Non-sugar drinks, such as water, unsweetened coffee, or unsweetened tea.  Low-calorie salad dressings.  Sugar-free gelatin. Starting meals with a salad full of vegetables is a healthy choice that includes a lot of free foods. Avoid high-calorie salad toppings like bacon, cheese,  and high-fat dressings. Ask for your salad dressing to be served on the side so that you dip your fork in the dressing and then in the salad. This allows you to control how much dressing you eat and still get the flavor with every bite. Choices to control carbohydrates   Ask your server to take away the bread basket or chips from your table.  Choose light yogurt or [place] yogurt instead of non-fat sweetened yogurt.  Order fresh fruit. A salad bar often offers fresh fruit choices. Avoid canned fruit because it is usually packed in sugar or syrup.  Order a salad, and ask for dressing on the side.  Ask for substitutes. For example, if your meal comes with french fries, ask for a side salad or steamed veggies instead. If a meal comes with fried chicken, ask for grilled chicken instead. Beverages  Choose drinks that are low in calories and sugar, such as: ? Water. ? Unsweetened tea or coffee. ? Lowfat milk.  Avoid the following drinks: ? Alcoholic beverages. ? Regular (not diet) sodas. Other tips  If you take insulin, wait to take your insulin once your food arrives to your table. This will ensure that your insulin and your food are timed correctly.  Become familiar with serving sizes and learn to recognize how many servings are in a portion. Restaurant portions are typically two to three times larger than what you really need.  Ask your server for a to-go box at the beginning of the meal. When your food comes, leave the amount you should have on your plate, and put the rest in the to-go box so that you are not tempted to eat too much.  Consider splitting an entree with someone and ordering a side salad.  Avoid buffets. They are typically too tempting and result in overeating. Where to find more information  American Diabetes Association: www.diabetes.org  American Association of Diabetes Educators: www.diabeteseducator.org Summary  Plan ahead when eating away from home.  Try to  eat your meals and snacks at about the same time each day. If you know your meal is going to be later than normal, make sure you have a small snack. Being very hungry can cause you to make unhealthy food choices.  Ask for substitutes. For example, if your meal comes with french fries, ask for a side salad or steamed veggies instead. If a meal comes with fried chicken, ask for grilled chicken instead.  Ask for a to-go box when you order your meal. Divide your meal before you start eating. This information is not intended to replace advice given to you by your health care provider. Make sure you discuss any questions  you have with your health care provider. Document Released: 06/18/2005 Document Revised: 09/26/2016 Document Reviewed: 09/26/2016 Elsevier Interactive Patient Education  2019 [employer]   Preventing Diabetes Mellitus Complications You can take action to prevent or slow down problems that are caused by diabetes (diabetes mellitus). Following your diabetes plan and taking care of yourself can reduce your risk of serious or life-threatening complications. What actions can I take to prevent diabetes complications? Manage your diabetes   Follow instructions from your health care providers about managing your diabetes. Your diabetes may be managed by a team of health care providers who can teach you how to care for yourself and can answer questions that you have.  Educate yourself about your condition so you can make healthy choices about eating and physical activity.  Check your blood sugar (glucose) levels as often as directed. Your health care provider will help you decide how often to check your blood glucose level depending on your treatment goals and how well you are meeting them.  Ask your health care provider if you should take low-dose aspirin daily and what dose is recommended for you. Taking low-dose aspirin daily is recommended to help prevent cardiovascular disease. Do  not use nicotine or tobacco Do not use any products that contain nicotine or tobacco, such as cigarettes and e-cigarettes. If you need help quitting, ask your health care provider. Nicotine raises your risk for diabetes problems. If you quit using nicotine:  You will lower your risk for heart attack, stroke, nerve disease, and kidney disease.  Your cholesterol and blood pressure may improve.  Your blood circulation will improve. Keep your blood pressure under control Your personal target blood pressure is determined based on:  Your age.  Your medicines.  How long you have had diabetes.  Any other medical conditions you have. To control your blood pressure:  Follow instructions from your health care provider about meal planning, exercise, and medicines.  Make sure your health care provider checks your blood pressure at every medical visit.  Monitor your blood pressure at home as told by your health care provider.  Keep your cholesterol under control To control your cholesterol:  Follow instructions from your health care provider about meal planning, exercise, and medicines.  Have your cholesterol checked at least once a year.  You may be prescribed medicine to lower cholesterol (statin). If you are not taking a statin, ask your health care provider if you should be. Controlling your cholesterol may:  Help prevent heart disease and stroke. These are the most common health problems for people with diabetes.  Improve your blood flow. Schedule and keep yearly physical exams and eye exams Your health care provider will tell you how often you need medical visits depending on your diabetes management plan. Keep all follow-up visits as directed. This is important so possible problems can be identified early and complications can be avoided or treated.  Every visit with your health care provider should include measuring your: ? Weight. ? Blood pressure. ? Blood glucose  control.  Your A1c (hemoglobin A1c) level should be checked: ? At least 2 times a year, if you are meeting your treatment goals. ? 4 times a year, if you are not meeting treatment goals or if your treatment goals have changed.  Your blood lipids (lipid profile) should be checked yearly. You should also be checked yearly for protein in your urine (urine microalbumin).  If you have type 1 diabetes, get an eye exam 3-5 years after  you are diagnosed, and then once a year after your first exam.  If you have type 2 diabetes, get an eye exam as soon as you are diagnosed, and then once a year after your first exam. Keep your vaccines current It is recommended that you receive:  A flu (influenza) vaccine every year.  A pneumonia (pneumococcal) vaccine and a hepatitis B vaccine. If you are age 42 or older, you may get the pneumonia vaccine as a series of two separate shots. Ask your health care provider which other vaccines may be recommended. Take care of your feet Diabetes may cause you to have poor blood circulation to your legs and feet. Because of this, taking care of your feet is very important. Diabetes can cause:  The skin on the feet to get thinner, break more easily, and heal more slowly.  Nerve damage in your legs and feet, which results in decreased feeling. You may not notice minor injuries that could lead to serious problems. To avoid foot problems:  Check your skin and feet every day for cuts, bruises, redness, blisters, or sores.  Schedule a foot exam with your health care provider once every year. This exam includes: ? Inspecting of the structure and skin of your feet. ? Checking the pulses and sensation in your feet.  Make sure that your health care provider performs a visual foot exam at every medical visit.  Take care of your teeth People with poorly controlled diabetes are more likely to have gum (periodontal) disease. Diabetes can make periodontal diseases harder to  control. If not treated, periodontal diseases can lead to tooth loss. To prevent this:  Brush your teeth twice a day.  Floss at least once a day.  Visit your dentist 2 times a year. Drink responsibly Limit alcohol intake to no more than 1 drink a day for nonpregnant women and 2 drinks a day for men. One drink equals 12 oz of beer, 5 oz of wine, or 1 oz of hard liquor.  It is important to eat food when you drink alcohol to avoid low blood glucose (hypoglycemia). Avoid alcohol if you:  Have a history of alcohol abuse or dependence.  Are pregnant.  Have liver disease, pancreatitis, advanced neuropathy, or severe hypertriglyceridemia. Lessen stress Living with diabetes can be stressful. When you are experiencing stress, your blood glucose may be affected in two ways:  Stress hormones may cause your blood glucose to rise.  You may be distracted from taking good care of yourself. Be aware of your stress level and make changes to help you manage challenging situations. To lower your stress levels:  Consider joining a support group.  Do planned relaxation or meditation.  Do a hobby that you enjoy.  Maintain healthy relationships.  Exercise regularly.  Work with your health care provider or a mental health professional. Summary  You can take action to prevent or slow down problems that are caused by diabetes (diabetes mellitus). Following your diabetes plan and taking care of yourself can reduce your risk of serious or life-threatening complications.  Follow instructions from your health care providers about managing your diabetes. Your diabetes may be managed by a team of health care providers who can teach you how to care for yourself and can answer questions that you have.  Your health care provider will tell you how often you need medical visits depending on your diabetes management plan. Keep all follow-up visits as directed. This is important so possible problems can be  identified early and complications can be avoided or treated. This information is not intended to replace advice given to you by your health care provider. Make sure you discuss any questions you have with your health care provider. Document Released: 03/06/2011 Document Revised: 02/05/2017 Document Reviewed: 03/17/2016 Elsevier Interactive Patient Education  2019 [employer]

## 2018-07-23 NOTE — Discharge Summary (Signed)
Discharge Summary  [name] QQI:[id number] DOB: 02/23/1976  PCP: Mindi Curling, PA-C  Admit date: 07/21/2018 Discharge date: 07/23/2018  Time spent: 35 minutes  Recommendations for Outpatient Follow-up:  1. Follow-up with your endocrinologist 2. Follow-up with your PCP 3. Take your medications as prescribed  Discharge Diagnoses:  Active Hospital Problems   Diagnosis Date Noted  . DKA, type 1 ([name]) 02/14/2015  . Volume depletion 07/21/2018  . Diabetic ketoacidosis ([place]) 07/21/2018  . Depression with anxiety 02/14/2015  . Leukocytosis 02/14/2015  . Acute kidney injury ([place]) 02/14/2015  . IDDM (insulin dependent diabetes mellitus) ([name]) 12/05/2012    Resolved Hospital Problems  No resolved problems to display.    Discharge Condition: Stable  Diet recommendation: Resume previous diet  Vitals:   07/23/18 1030 07/23/18 1120  BP: (!) 141/88 (!) 151/93  Pulse:  (!) 109  Resp:  16  Temp:  99.8 F (37.7 C)  SpO2:  99%    History of present illness:  43 y.o.femalewith past medical history of migraine, depression, type 1 diabetes on insulin pump, ADHD, presented to the hospital with nausea, vomiting, progressively getting worse for 1 day. Stated that she was unable to keep anything down. She does have insulin pump and reports compliance with medications, no changes. Denies any recent illnesses.Patient denies any abdominal pain, diarrhea, or constipation. Denies any sick contacts or recent travel. Denies urinary urgency, frequency, or dysuria.She has seen her endocrinologist Francetta Found 2 weeks prior. She does have a history of depression and is currently on multiple medications including treatment at [facility]. Complains of mild headache with her history of migraine. Patient takes [place] monthly injection formigraine.  In the ED, patient was noted to have a blood glucose level of 581, creatinine of 1.2, sodium of 130, WBC elevated at  14.3. Anion gap was 18. Patient was given 3 L of IV fluid bolus and was started on insulin drip. Hospitalist team was then consulted for admission to the hospital.   Tolerated her treatment well.  07/23/2018: Patient seen and examined at bedside.  No acute events overnight.  Continuing to hold use of insulin pump until she follows up with her endocrinologist.  Hyperglycemia this morning increased her Lantus and NovoLog doses with frequent CBGs.  Tolerated well with no episodes of hypoglycemia.  On the day of discharge, the patient was hemodynamically stable.  She will need to follow-up with her endocrinologist, her PCP posthospitalization.  She will also need to keep a log of her CBGs.  Hospital Course:  Principal Problem:   DKA, type 1 ([name]) Active Problems:   IDDM (insulin dependent diabetes mellitus) ([name])   Depression with anxiety   Acute kidney injury ([name])   Leukocytosis   Volume depletion   Diabetic ketoacidosis ([name])  Diabetic ketoacidosis,type 1 diabetes mellitus -Pt with hx of insulin pump with concerns of malfunctioning device -Given aggressive IVF and insulin drip.  Tolerated treatment well. -Gap is now closed and metabolic acidosis has resolved. -Persistent hyperglycemia, increased doses of Lantus and NovoLog with frequent CBGs -No episodes of hypoglycemia.  Follow-up with your endocrinologist outpatient  Please call within 48 hours to make an appointment for close follow-up posthospitalization.    Resolved leukocytosis  -likely reactive. -Afebrile with no sign of active infective process  Dehydration secondary to DKA -given aggressive IVF hydration  Acute kidney injury likely suspect prerenal secondary to volume depletion from DKA -Normal creatinine at baseline -Presented with creatinine of 1.23 and GFR 54 -Avoid dehydration  and nephrotoxic agents  History of depression and anxiety.  -Continue home medications Thorazine, duloxetine, mirtazapine,Xanax.   -Patient goes to [facility] for outpatient treatment of her depression(ECT).   Code Status: Full   Consultants:  None  Procedures:  None  Antimicrobials:     Anti-infectives (From admission, onward)   None      Discharge Exam: BP (!) 151/93 (BP Location: Left Arm)   Pulse (!) 109   Temp 99.8 F (37.7 C) (Oral)   Resp 16   Ht 5' 1"  (1.549 m)   Wt 72.6 kg   LMP 07/21/2018   SpO2 99%   BMI 30.23 kg/m  . General: 43 y.o. year-old female well developed well nourished in no acute distress.  Alert and oriented x3. . Cardiovascular: Regular rate and rhythm with no rubs or gallops.  No thyromegaly or JVD noted.   Marland Kitchen Respiratory: Clear to auscultation with no wheezes or rales. Good inspiratory effort. . Abdomen: Soft nontender nondistended with normal bowel sounds x4 quadrants. . Musculoskeletal: No lower extremity edema. 2/4 pulses in all 4 extremities. . Skin: No ulcerative lesions noted or rashes, . Psychiatry: Mood is appropriate for condition and setting  Discharge Instructions You were cared for by a hospitalist during your hospital stay. If you have any questions about your discharge medications or the care you received while you were in the hospital after you are discharged, you can call the unit and asked to speak with the hospitalist on call if the hospitalist that took care of you is not available. Once you are discharged, your primary care physician will handle any further medical issues. Please note that NO REFILLS for any discharge medications will be authorized once you are discharged, as it is imperative that you return to your primary care physician (or establish a relationship with a primary care physician if you do not have one) for your aftercare needs so that they can reassess your need for medications and monitor your lab values.   Allergies as of 07/23/2018      Reactions   Gluten Meal Other (See Comments)   abdominal cramps   Sulfa  Antibiotics Nausea And Vomiting   Wellbutrin [bupropion] Other (See Comments)   "Makes me suicidal."   Amoxicillin Rash   Has patient had a PCN reaction causing immediate rash, facial/tongue/throat swelling, SOB or lightheadedness with hypotension: Yes Has patient had a PCN reaction causing severe rash involving mucus membranes or skin necrosis: No Has patient had a PCN reaction that required hospitalization: No Has patient had a PCN reaction occurring within the last 10 years: No If all of the above answers are "NO", then may proceed with Cephalosporin use.   Codeine Rash   Cannot take just codeine. Able to tolerate derivatives such as hydrocodone      Medication List    STOP taking these medications   dexamethasone 2 MG tablet Commonly known as:  DECADRON   FIASP FLEXTOUCH 100 UNIT/ML Sopn Generic drug:  Insulin Aspart (w/Niacinamide)   TRESIBA FLEXTOUCH 100 UNIT/ML Sopn FlexTouch Pen Generic drug:  insulin degludec     TAKE these medications   AJOVY 225 MG/1.5ML Sosy Generic drug:  Fremanezumab-vfrm Inject 1 pen into the skin every 30 (thirty) days.   amphetamine-dextroamphetamine 20 MG tablet Commonly known as:  ADDERALL Take 20 mg by mouth 3 (three) times daily.   blood glucose meter kit and supplies Kit Dispense based on patient and insurance preference. Use up to four times daily  as directed. (FOR ICD-9 250.00, 250.01).   chlorproMAZINE 50 MG tablet Commonly known as:  THORAZINE Take 50 mg by mouth 2 (two) times daily.   clonazePAM 0.5 MG tablet Commonly known as:  KLONOPIN Take 0.5 mg by mouth 3 (three) times daily as needed for anxiety. For anxiety   dicyclomine 20 MG tablet Commonly known as:  BENTYL Take 1 tablet (20 mg total) by mouth 2 (two) times daily.   DULoxetine 30 MG capsule Commonly known as:  CYMBALTA Take 30 mg by mouth 3 (three) times daily.   gabapentin 100 MG capsule Commonly known as:  NEURONTIN Take 2 capsules (200 mg total) by mouth  3 (three) times daily. What changed:    medication strength  how much to take   ibuprofen 200 MG tablet Commonly known as:  ADVIL,MOTRIN Take 400-600 mg by mouth every 6 (six) hours as needed for fever or mild pain.   insulin aspart 100 UNIT/ML FlexPen Commonly known as:  NOVOLOG Inject 8 Units into the skin 3 (three) times daily with meals.   insulin glargine 100 unit/mL Sopn Commonly known as:  LANTUS Inject 0.12 mLs (12 Units total) into the skin daily.   levonorgestrel 20 MCG/24HR IUD Commonly known as:  MIRENA 1 each by Intrauterine route once. Placed 06/2014   mirtazapine 15 MG tablet Commonly known as:  REMERON Take 45 mg by mouth at bedtime. What changed:  Another medication with the same name was removed. Continue taking this medication, and follow the directions you see here.   pantoprazole 40 MG tablet Commonly known as:  PROTONIX Take 40 mg by mouth daily.   polyethylene glycol packet Commonly known as:  MIRALAX / GLYCOLAX Take 17 g by mouth 2 (two) times daily.   traZODone 50 MG tablet Commonly known as:  DESYREL Take 200 mg by mouth at bedtime as needed for sleep. What changed:  Another medication with the same name was removed. Continue taking this medication, and follow the directions you see here.   zolpidem 10 MG tablet Commonly known as:  AMBIEN Take 10 mg by mouth at bedtime as needed for sleep.      Allergies  Allergen Reactions  . Gluten Meal Other (See Comments)    abdominal cramps  . Sulfa Antibiotics Nausea And Vomiting  . Wellbutrin [Bupropion] Other (See Comments)    "Makes me suicidal."  . Amoxicillin Rash    Has patient had a PCN reaction causing immediate rash, facial/tongue/throat swelling, SOB or lightheadedness with hypotension: Yes Has patient had a PCN reaction causing severe rash involving mucus membranes or skin necrosis: No Has patient had a PCN reaction that required hospitalization: No Has patient had a PCN reaction  occurring within the last 10 years: No If all of the above answers are "NO", then may proceed with Cephalosporin use.   . Codeine Rash    Cannot take just codeine. Able to tolerate derivatives such as hydrocodone   Follow-up Information    Juanda Chance. Call in 1 day(s).   Specialty:  Physician Assistant Why:  please call for a post hospital follow up appointment Contact information: [address] [place] [address] [phone]            The results of significant diagnostics from this hospitalization (including imaging, microbiology, ancillary and laboratory) are listed below for reference.    Significant Diagnostic Studies: Dg Chest 2 View  Result Date: 06/30/2018 CLINICAL DATA:  Preoperative assessment for electroconvulsive  therapy. Cough. EXAM: CHEST - 2 VIEW COMPARISON:  March 21, 2018 FINDINGS: The lungs are clear. Heart size and pulmonary vascularity are normal. No adenopathy. No bone lesions. IMPRESSION: No abnormality noted. Electronically Signed   By: Lowella Grip III M.D.   On: 06/30/2018 20:01   Dg Chest Port 1 View  Result Date: 07/21/2018 CLINICAL DATA:  Nausea and vomiting, DKA EXAM: PORTABLE CHEST 1 VIEW COMPARISON:  06/30/2018 FINDINGS: The heart size and mediastinal contours are within normal limits. Both lungs are clear. The visualized skeletal structures are unremarkable. IMPRESSION: No active disease. Electronically Signed   By: Inez Catalina M.D.   On: 07/21/2018 17:55    Microbiology: Recent Results (from the past 240 hour(s))  MRSA PCR Screening     Status: None   Collection Time: 07/21/18  8:15 PM  Result Value Ref Range Status   MRSA by PCR NEGATIVE NEGATIVE Final    Comment:        The GeneXpert MRSA Assay (FDA approved for NASAL specimens only), is one component of a comprehensive MRSA colonization surveillance program. It is not intended to diagnose MRSA infection nor to guide or monitor treatment  for MRSA infections. Performed at [facility], [place] [address] [place], [place] [address]      Labs: Basic Metabolic Panel: Recent Labs  Lab 07/21/18 1407  07/22/18 0158 07/22/18 0555 07/22/18 1012 07/22/18 1425 07/23/18 0253  NA 130*   < > 135 134* 139 136 138  K 5.0   < > 4.0 4.2 4.2 4.8 4.6  CL 93*   < > 108 108 114* 110 109  CO2 19*   < > 18* 18* 18* 18* 22  GLUCOSE 581*   < > 154* 197* 117* 243* 212*  BUN 28*   < > 18 15 12 10 11   CREATININE 1.23*   < > 0.71 0.69 0.65 0.66 0.72  CALCIUM 9.3   < > 7.9* 7.7* 7.9* 7.9* 8.5*  MG 2.6*  --   --   --   --   --   --    < > = values in this interval not displayed.   Liver Function Tests: Recent Labs  Lab 07/21/18 1407  AST 17  ALT 19  ALKPHOS 70  BILITOT 1.7*  PROT 8.0  ALBUMIN 4.7   No results for input(s): LIPASE, AMYLASE in the last 168 hours. No results for input(s): AMMONIA in the last 168 hours. CBC: Recent Labs  Lab 07/21/18 1407 07/21/18 1722 07/22/18 0158  WBC 14.3* 12.9* 9.7  NEUTROABS 12.1*  --   --   HGB 13.8 13.6 11.4*  HCT 44.6 43.5 36.3  MCV 94.1 92.9 94.3  PLT 417* 418* 340   Cardiac Enzymes: No results for input(s): CKTOTAL, CKMB, CKMBINDEX, TROPONINI in the last 168 hours. BNP: BNP (last 3 results) No results for input(s): BNP in the last 8760 hours.  ProBNP (last 3 results) No results for input(s): PROBNP in the last 8760 hours.  CBG: Recent Labs  Lab 07/22/18 1641 07/22/18 2153 07/23/18 0158 07/23/18 0742 07/23/18 1133  GLUCAP 262* 109* 172* 270* 194*       Signed:  Kayleen Memos, MD [facility] 07/23/2018, 1:17 PM

## 2018-07-25 ENCOUNTER — Telehealth: Payer: Self-pay

## 2018-07-27 ENCOUNTER — Other Ambulatory Visit: Payer: Self-pay | Admitting: Psychiatry

## 2018-07-28 ENCOUNTER — Encounter: Payer: Self-pay | Admitting: Anesthesiology

## 2018-07-28 ENCOUNTER — Telehealth: Payer: Self-pay

## 2018-07-28 ENCOUNTER — Inpatient Hospital Stay: Admission: RE | Admit: 2018-07-28 | Payer: Self-pay | Source: Ambulatory Visit

## 2018-08-13 ENCOUNTER — Telehealth: Payer: Self-pay

## 2018-08-25 ENCOUNTER — Encounter: Payer: Self-pay | Admitting: Obstetrics and Gynecology

## 2018-08-25 ENCOUNTER — Other Ambulatory Visit: Payer: Self-pay

## 2018-08-25 ENCOUNTER — Ambulatory Visit (INDEPENDENT_AMBULATORY_CARE_PROVIDER_SITE_OTHER): Payer: BC Managed Care – PPO | Admitting: Obstetrics and Gynecology

## 2018-08-25 VITALS — BP 148/88 | HR 96 | Wt 166.2 lb

## 2018-08-25 DIAGNOSIS — R3915 Urgency of urination: Secondary | ICD-10-CM | POA: Diagnosis not present

## 2018-08-25 DIAGNOSIS — Z3009 Encounter for other general counseling and advice on contraception: Secondary | ICD-10-CM | POA: Diagnosis not present

## 2018-08-25 DIAGNOSIS — R350 Frequency of micturition: Secondary | ICD-10-CM | POA: Diagnosis not present

## 2018-08-25 DIAGNOSIS — N3090 Cystitis, unspecified without hematuria: Secondary | ICD-10-CM | POA: Diagnosis not present

## 2018-08-25 LAB — POCT URINALYSIS DIPSTICK
Bilirubin, UA: NEGATIVE
Blood, UA: POSITIVE
Glucose, UA: NEGATIVE
Ketones, UA: NEGATIVE
Nitrite, UA: NEGATIVE
PH UA: 7 (ref 5.0–8.0)
PROTEIN UA: NEGATIVE
Spec Grav, UA: 1.01 (ref 1.010–1.025)
Urobilinogen, UA: 0.2 E.U./dL

## 2018-08-25 MED ORDER — PHENAZOPYRIDINE HCL 200 MG PO TABS: 200.0000 mg | ORAL_TABLET | Freq: Three times a day (TID) | ORAL | 0 refills | Status: DC | PRN

## 2018-08-25 MED ORDER — NITROFURANTOIN MONOHYD MACRO 100 MG PO CAPS: 100.0000 mg | ORAL_CAPSULE | Freq: Two times a day (BID) | ORAL | 0 refills | Status: DC

## 2018-08-25 MED ORDER — MISOPROSTOL 200 MCG PO TABS: ORAL_TABLET | ORAL | 0 refills | Status: DC

## 2018-08-25 MED ORDER — HYDROMORPHONE HCL 2 MG PO TABS: 2.0000 mg | ORAL_TABLET | ORAL | 0 refills | Status: DC | PRN

## 2018-08-25 MED ORDER — IBUPROFEN 800 MG PO TABS: 800.0000 mg | ORAL_TABLET | Freq: Three times a day (TID) | ORAL | 1 refills | Status: DC | PRN

## 2018-08-25 NOTE — Progress Notes (Signed)
GYNECOLOGY  VISIT   HPI: 43 y.o.   Single White or Caucasian Not Hispanic or Latino  female   G0P0000 with No LMP recorded. (Menstrual status: IUD).   here to discuss contraception and UTI symptoms.   She c/o urinary frequency, urgency and dysuria for the last few days. No fevers, moderate mid back pain. Doesn't feel systemically ill.  Prior to the IUD, she had moderate cramps and sometime heavy.  She isn't having cycles with the mirena.  She had a horrible time with her last IUD insertion and is asking for pain medication prior to insertion.   HgbA1C from last month was 8.1  H/O uncontrolled Type 1 Diabetes w/retinopathy. She was admitted to the hospital on 07/21/18 with DKA  GYNECOLOGIC HISTORY: No LMP recorded. (Menstrual status: IUD). Contraception: IUD Menopausal hormone therapy: None        OB History    Gravida  0   Para  0   Term  0   Preterm  0   AB  0   Living  0     SAB  0   TAB  0   Ectopic  0   Multiple  0   Live Births  0              Patient Active Problem List   Diagnosis Date Noted  . Volume depletion 07/21/2018  . Diabetic ketoacidosis ([place]) 07/21/2018  . Morbid obesity ([place]) 06/16/2018  . Chronic tension-type headache, intractable 06/03/2018  . Multiple thyroid nodules 04/17/2018  . 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 DKA, type 1 ([place]) 02/14/2015  . DKA (diabetic ketoacidoses) ([place]) 02/14/2015  . Thyroid nodule 02/14/2015  . Depression with anxiety 02/14/2015  . Acute kidney injury ([place]) 02/14/2015  . Leukocytosis 02/14/2015  . Increased anion gap metabolic acidosis 37/16/9678  . History of frequent urinary tract infections 01/25/2014  . Anxiety 12/04/2013  . Neuropathic pain 12/04/2013  . Recurrent UTI 12/04/2013  . Aphakia of left eye 06/23/2013  . Retinal detachment 06/23/2013  . PDR (proliferative diabetic retinopathy) ([place]) 06/23/2013  . Recurrent major depression-severe ([place]) 01/06/2013  .  Generalized anxiety disorder 01/06/2013  . Common migraine with intractable migraine 12/05/2012  . IDDM (insulin dependent diabetes mellitus) ([place]) 12/05/2012  . RLQ abdominal pain 11/30/2011    Past Medical History:  Diagnosis Date  . Adopted   . Cluster headache syndrome, intractable   . Depression   . Migraine without aura, with intractable migraine, so stated, without mention of status migrainosus 12/05/2012  . Ovarian cyst   . STD (sexually transmitted disease)    HSV II   . Type I diabetes mellitus ([place])     Past Surgical History:  Procedure Laterality Date  . APPENDECTOMY    . BUNIONECTOMY Bilateral   . CHOLECYSTECTOMY    . EYE SURGERY    . OVARIAN CYST REMOVAL      Current Outpatient Medications  Medication Sig Dispense Refill  . amphetamine-dextroamphetamine (ADDERALL) 20 MG tablet Take 20 mg by mouth 3 (three) times daily.     . blood glucose meter kit and supplies KIT Dispense based on patient and insurance preference. Use up to four times daily as directed. (FOR ICD-9 250.00, 250.01). 1 each 0  . chlorproMAZINE (THORAZINE) 50 MG tablet Take 50 mg by mouth 2 (two) times daily.    . clonazePAM (KLONOPIN) 0.5 MG tablet Take 0.5 mg by mouth 3 (three) times daily as needed for anxiety.  For anxiety    . dicyclomine (BENTYL) 20 MG tablet Take 1 tablet (20 mg total) by mouth 2 (two) times daily. 20 tablet 0  . DULoxetine (CYMBALTA) 60 MG capsule Take 60 mg by mouth 2 (two) times daily.    . Fremanezumab-vfrm (AJOVY) 225 MG/1.5ML SOSY Inject 1 pen into the skin every 30 (thirty) days.    Marland Kitchen ibuprofen (ADVIL,MOTRIN) 200 MG tablet Take 400-600 mg by mouth every 6 (six) hours as needed for fever or mild pain.    . Insulin Human (INSULIN PUMP) SOLN Inject into the skin.    Marland Kitchen levonorgestrel (MIRENA) 20 MCG/24HR IUD 1 each by Intrauterine route once. Placed 06/2014    . pantoprazole (PROTONIX) 40 MG tablet Take 40 mg by mouth daily.    . traZODone (DESYREL) 50 MG tablet Take 200 mg  by mouth at bedtime as needed for sleep.     Marland Kitchen zolpidem (AMBIEN) 10 MG tablet Take 10 mg by mouth at bedtime as needed for sleep.     . Flibanserin (ADDYI) 100 MG TABS Take 100 mg by mouth daily.    Marland Kitchen gabapentin (NEURONTIN) 300 MG capsule Take 300 mg by mouth 3 (three) times daily.     . mirtazapine (REMERON) 15 MG tablet Take 45 mg by mouth at bedtime.     . polyethylene glycol (MIRALAX / GLYCOLAX) packet Take 17 g by mouth 2 (two) times daily. (Patient not taking: Reported on 07/21/2018) 30 each 0  . REXULTI 2 MG TABS Take 2 mg by mouth daily.     No current facility-administered medications for this visit.      ALLERGIES: Gluten meal; Sulfa antibiotics; Wellbutrin [bupropion]; Amoxicillin; and Codeine  Family History  Adopted: Yes  Problem Relation Age of Onset  . Diabetes Father     Social History   Socioeconomic History  . Marital status: Single    Spouse name: Not on file  . Number of children: 0  . Years of education: Not on file  . Highest education level: Some college, no degree  Occupational History    Employer: [employer]  Social Needs  . Financial resource strain: Hard  . Food insecurity:    Worry: Often true    Inability: Often true  . Transportation needs:    Medical: Yes    Non-medical: Yes  Tobacco Use  . Smoking status: Never Smoker  . Smokeless tobacco: Never Used  Substance and Sexual Activity  . Alcohol use: Yes    Comment: Once a month   . Drug use: No  . Sexual activity: Yes    Partners: Male    Birth control/protection: I.U.D.  Lifestyle  . Physical activity:    Days per week: 0 days    Minutes per session: 0 min  . Stress: Very much  Relationships  . Social connections:    Talks on phone: Not on file    Gets together: Not on file    Attends religious service: Never    Active member of club or organization: No    Attends meetings of clubs or organizations: Never    Relationship status: Never married  . Intimate partner violence:     Fear of current or ex partner: No    Emotionally abused: Yes    Physically abused: No    Forced sexual activity: No  Other Topics Concern  . Not on file  Social History Narrative   Patient works at [employer] and lives alone.  She may stay with friends at times.   She has no children and a college education.   Some tea and soda   Right handed     Review of Systems  Constitutional: Negative.   HENT: Negative.   Eyes: Negative.   Respiratory: Negative.   Gastrointestinal: Negative.   Genitourinary: Positive for dysuria, frequency and urgency.       Loss of sexual interest  Musculoskeletal: Negative.   Skin: Negative.   Neurological: Positive for headaches.  Endo/Heme/Allergies:       Excessive thirst  Psychiatric/Behavioral: Positive for depression. The patient is nervous/anxious.        Lack of coordination    PHYSICAL EXAMINATION:    BP (!) 148/88 (BP Location: Right Arm, Patient Position: Sitting, Cuff Size: Normal)   Pulse 96   Wt 166 lb 3.2 oz (75.4 kg)   BMI 31.40 kg/m   T 99   General appearance: alert, cooperative and appears stated age Abdomen: soft, mild diffuse tenderness, no rebound, no guarding; non distended, no masses,  no organomegaly CVA: mildly tender bilaterally   Urine dip +blood, +leuk  ASSESSMENT Cystitis Contraception management, discussed option for IUD's, she would like another mirena    PLAN Urine for ua, c&s Macrobid and pyridium for UTI Call with fever or worsening back pain Return for mirena IUD insertion Will pre-treat with dilaudid, ibuprofen and cytotec (she has a very hard time with prior insertion). Consent signed today, boyfriend will come with her   An After Visit Summary was printed and given to the patient.
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
## 2018-08-25 NOTE — Patient Instructions (Signed)

## 2018-08-26 ENCOUNTER — Ambulatory Visit: Payer: BC Managed Care – PPO | Admitting: Neurology

## 2018-08-26 LAB — URINALYSIS, MICROSCOPIC ONLY
Casts: NONE SEEN /lpf
RBC, UA: NONE SEEN /hpf (ref 0–2)

## 2018-08-26 LAB — URINE CULTURE

## 2018-08-27 ENCOUNTER — Telehealth: Payer: Self-pay

## 2018-08-27 ENCOUNTER — Telehealth: Payer: Self-pay | Admitting: Obstetrics and Gynecology

## 2018-08-27 DIAGNOSIS — N3090 Cystitis, unspecified without hematuria: Secondary | ICD-10-CM

## 2018-08-27 DIAGNOSIS — R3915 Urgency of urination: Secondary | ICD-10-CM

## 2018-08-27 DIAGNOSIS — R350 Frequency of micturition: Secondary | ICD-10-CM

## 2018-08-27 MED ORDER — PROMETHAZINE HCL 25 MG PO TABS: 25.0000 mg | ORAL_TABLET | Freq: Four times a day (QID) | ORAL | 1 refills | Status: DC | PRN

## 2018-08-27 NOTE — Telephone Encounter (Signed)
Spoke with patient. Menses started today, is scheduled for Mirena IUD exchange on 2/27, current IUD expired. Asking if ok to proceed?   Advised OK to proceed with IUD exchange as scheduled with Dr. Oscar La. Patient verbalizes understanding, will keep Procedure as scheduled for 2/27 at 4pm.  Routing to provider for final review. Patient is agreeable to disposition. Will close encounter.

## 2018-08-27 NOTE — Addendum Note (Signed)
Addended by: York Spaniel on: 08/27/2018 10:10 AM   Modules accepted: Orders

## 2018-08-27 NOTE — Telephone Encounter (Signed)
Patient was unable to keep scheduled appt with Dr. Jannifer Franklin on 08/26/18 at 8:30 due to not having copayment. Patient states she will call back and reschedule this appt. She did want to request a rx for Promethazine and Fioricet to help with migraine management until she is able to be seen. I advised I would ask but this request may not be met due to f/u visit being needed. Patient verbalized understanding. Preferred [facility] [address].

## 2018-08-27 NOTE — Telephone Encounter (Signed)
Spoke with patient. Results given. Patient verbalizes understanding. States she is still having ongoing symptoms and would like a referral to urology at this time. Referral placed to [facility]. Patient is aware she will be contacted to schedule an appointment.

## 2018-08-27 NOTE — Telephone Encounter (Signed)
We have not given this patient a prescription for Fioricet in the past, I do not like using this medication individuals that have frequent headache.  I will send in a prescription for the Phenergan.

## 2018-08-27 NOTE — Telephone Encounter (Signed)
Patient's period started today and she's scheduled for mirena insertion tomorrow. Should she keep appointment?

## 2018-08-28 ENCOUNTER — Other Ambulatory Visit: Payer: Self-pay

## 2018-08-28 ENCOUNTER — Ambulatory Visit (INDEPENDENT_AMBULATORY_CARE_PROVIDER_SITE_OTHER): Payer: BC Managed Care – PPO | Admitting: Obstetrics and Gynecology

## 2018-08-28 ENCOUNTER — Encounter: Payer: Self-pay | Admitting: Obstetrics and Gynecology

## 2018-08-28 VITALS — BP 126/62 | HR 88 | Wt 168.0 lb

## 2018-08-28 DIAGNOSIS — Z3009 Encounter for other general counseling and advice on contraception: Secondary | ICD-10-CM

## 2018-08-28 DIAGNOSIS — R350 Frequency of micturition: Secondary | ICD-10-CM | POA: Diagnosis not present

## 2018-08-28 DIAGNOSIS — Z01812 Encounter for preprocedural laboratory examination: Secondary | ICD-10-CM

## 2018-08-28 LAB — POCT URINALYSIS DIPSTICK
Bilirubin, UA: NEGATIVE
Glucose, UA: NEGATIVE
Ketones, UA: NEGATIVE
Nitrite, UA: NEGATIVE
PH UA: 5 (ref 5.0–8.0)
Protein, UA: NEGATIVE
RBC UA: POSITIVE
Spec Grav, UA: 1.01 (ref 1.010–1.025)
Urobilinogen, UA: 0.2 E.U./dL

## 2018-08-28 LAB — POCT URINE PREGNANCY: Preg Test, Ur: NEGATIVE

## 2018-08-28 NOTE — Patient Instructions (Signed)

## 2018-09-10 ENCOUNTER — Other Ambulatory Visit: Payer: Self-pay | Admitting: Obstetrics and Gynecology

## 2018-09-17 ENCOUNTER — Other Ambulatory Visit: Payer: Self-pay

## 2018-09-17 ENCOUNTER — Inpatient Hospital Stay (HOSPITAL_COMMUNITY)
Admission: EM | Admit: 2018-09-17 | Discharge: 2018-09-19 | DRG: 919 | Disposition: A | Discharge status: Home / Self Care | Payer: BC Managed Care – PPO | Attending: Internal Medicine | Admitting: Internal Medicine

## 2018-09-17 ENCOUNTER — Encounter (HOSPITAL_COMMUNITY): Payer: Self-pay

## 2018-09-17 ENCOUNTER — Inpatient Hospital Stay (HOSPITAL_COMMUNITY): Payer: BC Managed Care – PPO

## 2018-09-17 DIAGNOSIS — Z793 Long term (current) use of hormonal contraceptives: Secondary | ICD-10-CM

## 2018-09-17 DIAGNOSIS — Z888 Allergy status to other drugs, medicaments and biological substances status: Secondary | ICD-10-CM

## 2018-09-17 DIAGNOSIS — F332 Major depressive disorder, recurrent severe without psychotic features: Secondary | ICD-10-CM | POA: Diagnosis present

## 2018-09-17 DIAGNOSIS — Z794 Long term (current) use of insulin: Secondary | ICD-10-CM | POA: Diagnosis not present

## 2018-09-17 DIAGNOSIS — Z882 Allergy status to sulfonamides status: Secondary | ICD-10-CM | POA: Diagnosis not present

## 2018-09-17 DIAGNOSIS — T85694A Other mechanical complication of insulin pump, initial encounter: Secondary | ICD-10-CM | POA: Diagnosis not present

## 2018-09-17 DIAGNOSIS — Y742 Prosthetic and other implants, materials and accessory general hospital and personal-use devices associated with adverse incidents: Secondary | ICD-10-CM | POA: Diagnosis present

## 2018-09-17 DIAGNOSIS — E1010 Type 1 diabetes mellitus with ketoacidosis without coma: Secondary | ICD-10-CM

## 2018-09-17 DIAGNOSIS — Z79891 Long term (current) use of opiate analgesic: Secondary | ICD-10-CM

## 2018-09-17 DIAGNOSIS — F909 Attention-deficit hyperactivity disorder, unspecified type: Secondary | ICD-10-CM | POA: Diagnosis present

## 2018-09-17 DIAGNOSIS — K219 Gastro-esophageal reflux disease without esophagitis: Secondary | ICD-10-CM | POA: Diagnosis present

## 2018-09-17 DIAGNOSIS — F418 Other specified anxiety disorders: Secondary | ICD-10-CM | POA: Diagnosis present

## 2018-09-17 DIAGNOSIS — D72829 Elevated white blood cell count, unspecified: Secondary | ICD-10-CM | POA: Diagnosis present

## 2018-09-17 DIAGNOSIS — E041 Nontoxic single thyroid nodule: Secondary | ICD-10-CM | POA: Diagnosis present

## 2018-09-17 DIAGNOSIS — Z885 Allergy status to narcotic agent status: Secondary | ICD-10-CM

## 2018-09-17 DIAGNOSIS — E103599 Type 1 diabetes mellitus with proliferative diabetic retinopathy without macular edema, unspecified eye: Secondary | ICD-10-CM | POA: Diagnosis present

## 2018-09-17 DIAGNOSIS — F411 Generalized anxiety disorder: Secondary | ICD-10-CM | POA: Diagnosis present

## 2018-09-17 DIAGNOSIS — E860 Dehydration: Secondary | ICD-10-CM | POA: Diagnosis present

## 2018-09-17 DIAGNOSIS — Z881 Allergy status to other antibiotic agents status: Secondary | ICD-10-CM

## 2018-09-17 DIAGNOSIS — Z6831 Body mass index (BMI) 31.0-31.9, adult: Secondary | ICD-10-CM | POA: Diagnosis not present

## 2018-09-17 DIAGNOSIS — E7849 Other hyperlipidemia: Secondary | ICD-10-CM | POA: Diagnosis present

## 2018-09-17 DIAGNOSIS — A6000 Herpesviral infection of urogenital system, unspecified: Secondary | ICD-10-CM | POA: Diagnosis present

## 2018-09-17 DIAGNOSIS — Z79899 Other long term (current) drug therapy: Secondary | ICD-10-CM

## 2018-09-17 DIAGNOSIS — E119 Type 2 diabetes mellitus without complications: Secondary | ICD-10-CM

## 2018-09-17 DIAGNOSIS — G629 Polyneuropathy, unspecified: Secondary | ICD-10-CM | POA: Diagnosis present

## 2018-09-17 DIAGNOSIS — M792 Neuralgia and neuritis, unspecified: Secondary | ICD-10-CM | POA: Diagnosis present

## 2018-09-17 DIAGNOSIS — D638 Anemia in other chronic diseases classified elsewhere: Secondary | ICD-10-CM | POA: Diagnosis present

## 2018-09-17 DIAGNOSIS — Z9049 Acquired absence of other specified parts of digestive tract: Secondary | ICD-10-CM | POA: Diagnosis not present

## 2018-09-17 DIAGNOSIS — IMO0001 Reserved for inherently not codable concepts without codable children: Secondary | ICD-10-CM

## 2018-09-17 LAB — COMPREHENSIVE METABOLIC PANEL
ALT: 17 U/L (ref 0–44)
AST: 20 U/L (ref 15–41)
Albumin: 4.5 g/dL (ref 3.5–5.0)
Alkaline Phosphatase: 62 U/L (ref 38–126)
Anion gap: 20 — ABNORMAL HIGH (ref 5–15)
BUN: 19 mg/dL (ref 6–20)
CO2: 13 mmol/L — ABNORMAL LOW (ref 22–32)
Calcium: 9 mg/dL (ref 8.9–10.3)
Chloride: 97 mmol/L — ABNORMAL LOW (ref 98–111)
Creatinine, Ser: 1.08 mg/dL — ABNORMAL HIGH (ref 0.44–1.00)
GFR calc non Af Amer: >60 mL/min (ref >60)
Glucose, Bld: 498 mg/dL — ABNORMAL HIGH (ref 70–99)
POTASSIUM: 4.5 mmol/L (ref 3.5–5.1)
Sodium: 130 mmol/L — ABNORMAL LOW (ref 135–145)
Total Bilirubin: 1.2 mg/dL (ref 0.3–1.2)
Total Protein: 7.7 g/dL (ref 6.5–8.1)

## 2018-09-17 LAB — BASIC METABOLIC PANEL
Anion gap: 9 (ref 5–15)
BUN: 16 mg/dL (ref 6–20)
CO2: 16 mmol/L — ABNORMAL LOW (ref 22–32)
Calcium: 7.9 mg/dL — ABNORMAL LOW (ref 8.9–10.3)
Chloride: 109 mmol/L (ref 98–111)
Creatinine, Ser: 0.88 mg/dL (ref 0.44–1.00)
GFR calc Af Amer: >60 mL/min (ref >60)
GFR calc non Af Amer: >60 mL/min (ref >60)
Glucose, Bld: 244 mg/dL — ABNORMAL HIGH (ref 70–99)
Potassium: 4.5 mmol/L (ref 3.5–5.1)
Sodium: 134 mmol/L — ABNORMAL LOW (ref 135–145)

## 2018-09-17 LAB — POCT I-STAT EG7
Acid-base deficit: 12 mmol/L — ABNORMAL HIGH (ref 0.0–2.0)
Bicarbonate: 14.1 mmol/L — ABNORMAL LOW (ref 20.0–28.0)
Calcium, Ion: 1.15 mmol/L (ref 1.15–1.40)
HCT: 42 % (ref 36.0–46.0)
HEMOGLOBIN: 14.3 g/dL (ref 12.0–15.0)
O2 Saturation: 79 %
Potassium: 4.6 mmol/L (ref 3.5–5.1)
SODIUM: 129 mmol/L — AB (ref 135–145)
TCO2: 15 mmol/L — ABNORMAL LOW (ref 22–32)
pCO2, Ven: 32.3 mmHg — ABNORMAL LOW (ref 44.0–60.0)
pH, Ven: 7.249 — ABNORMAL LOW (ref 7.250–7.430)
pO2, Ven: 50 mmHg — ABNORMAL HIGH (ref 32.0–45.0)

## 2018-09-17 LAB — MAGNESIUM: Magnesium: 2.1 mg/dL (ref 1.7–2.4)

## 2018-09-17 LAB — GLUCOSE, CAPILLARY
GLUCOSE-CAPILLARY: 206 mg/dL — AB (ref 70–99)
GLUCOSE-CAPILLARY: 233 mg/dL — AB (ref 70–99)
Glucose-Capillary: 172 mg/dL — ABNORMAL HIGH (ref 70–99)

## 2018-09-17 LAB — CBC
HCT: 43.1 % (ref 36.0–46.0)
Hemoglobin: 13.3 g/dL (ref 12.0–15.0)
MCH: 29.1 pg (ref 26.0–34.0)
MCHC: 30.9 g/dL (ref 30.0–36.0)
MCV: 94.3 fL (ref 80.0–100.0)
NRBC: 0 % (ref 0.0–0.2)
PLATELETS: 368 10*3/uL (ref 150–400)
RBC: 4.57 MIL/uL (ref 3.87–5.11)
RDW: 12.6 % (ref 11.5–15.5)
WBC: 16.8 10*3/uL — ABNORMAL HIGH (ref 4.0–10.5)

## 2018-09-17 LAB — HEMOGLOBIN A1C
HEMOGLOBIN A1C: 7.5 % — AB (ref 4.8–5.6)
Mean Plasma Glucose: 168.55 mg/dL

## 2018-09-17 LAB — CBG MONITORING, ED
Glucose-Capillary: 460 mg/dL — ABNORMAL HIGH (ref 70–99)
Glucose-Capillary: 430 mg/dL — ABNORMAL HIGH (ref 70–99)
Glucose-Capillary: 320 mg/dL — ABNORMAL HIGH (ref 70–99)
Glucose-Capillary: 227 mg/dL — ABNORMAL HIGH (ref 70–99)

## 2018-09-17 LAB — I-STAT BETA HCG BLOOD, ED (MC, WL, AP ONLY): I-stat hCG, quantitative: <5 m[IU]/mL (ref <5)

## 2018-09-17 LAB — TROPONIN I: Troponin I: <0.03 ng/mL (ref <0.03)

## 2018-09-17 LAB — PHOSPHORUS: Phosphorus: 2.3 mg/dL — ABNORMAL LOW (ref 2.5–4.6)

## 2018-09-17 LAB — MRSA PCR SCREENING: MRSA by PCR: NEGATIVE

## 2018-09-17 LAB — LIPASE, BLOOD: Lipase: 23 U/L (ref 11–51)

## 2018-09-17 LAB — BETA-HYDROXYBUTYRIC ACID: Beta-Hydroxybutyric Acid: 2.76 mmol/L — ABNORMAL HIGH (ref 0.05–0.27)

## 2018-09-17 MED ORDER — GABAPENTIN 300 MG PO CAPS
300.0000 mg | ORAL_CAPSULE | Freq: Three times a day (TID) | ORAL | Status: DC
  Administered 2018-09-17 – 2018-09-19 (×3): 300 mg via ORAL
  Filled 2018-09-17 (×4): qty 1

## 2018-09-17 MED ORDER — ACETAMINOPHEN 325 MG PO TABS: 650.0000 mg | ORAL_TABLET | ORAL | Status: DC | PRN

## 2018-09-17 MED ORDER — SODIUM CHLORIDE 0.9 % IV SOLN: 500.0000 mL | Freq: Once | INTRAVENOUS | Status: DC

## 2018-09-17 MED ORDER — CLONAZEPAM 0.5 MG PO TABS
0.5000 mg | ORAL_TABLET | Freq: Three times a day (TID) | ORAL | Status: DC | PRN
  Administered 2018-09-19: 0.5 mg via ORAL
  Filled 2018-09-17: qty 1

## 2018-09-17 MED ORDER — PANTOPRAZOLE SODIUM 40 MG PO TBEC
40.0000 mg | DELAYED_RELEASE_TABLET | Freq: Once | ORAL | Status: AC
  Administered 2018-09-17: 40 mg via ORAL
  Filled 2018-09-17: qty 1

## 2018-09-17 MED ORDER — KETOROLAC TROMETHAMINE 30 MG/ML IJ SOLN: 30.0000 mg | Freq: Once | INTRAMUSCULAR | Status: DC

## 2018-09-17 MED ORDER — AMPHETAMINE-DEXTROAMPHETAMINE 20 MG PO TABS
20.0000 mg | ORAL_TABLET | Freq: Three times a day (TID) | ORAL | Status: DC
  Administered 2018-09-18 – 2018-09-19 (×4): 20 mg via ORAL
  Filled 2018-09-17: qty 1
  Filled 2018-09-17 (×2): qty 2
  Filled 2018-09-17: qty 1

## 2018-09-17 MED ORDER — DEXTROSE-NACL 5-0.45 % IV SOLN
INTRAVENOUS | Status: DC
  Administered 2018-09-17 – 2018-09-18 (×2): via INTRAVENOUS

## 2018-09-17 MED ORDER — TRAZODONE HCL 50 MG PO TABS: 150.0000 mg | ORAL_TABLET | Freq: Every evening | ORAL | Status: DC | PRN

## 2018-09-17 MED ORDER — ONDANSETRON HCL 4 MG/2ML IJ SOLN: 4.0000 mg | Freq: Four times a day (QID) | INTRAMUSCULAR | Status: DC | PRN

## 2018-09-17 MED ORDER — BREXPIPRAZOLE 2 MG PO TABS
2.0000 mg | ORAL_TABLET | Freq: Every day | ORAL | Status: DC
  Administered 2018-09-17 – 2018-09-19 (×3): 2 mg via ORAL
  Filled 2018-09-17 (×3): qty 1

## 2018-09-17 MED ORDER — POTASSIUM CHLORIDE 10 MEQ/100ML IV SOLN
10.0000 meq | INTRAVENOUS | Status: AC
  Administered 2018-09-17 (×2): 10 meq via INTRAVENOUS
  Filled 2018-09-17 (×2): qty 100

## 2018-09-17 MED ORDER — SODIUM CHLORIDE 0.9 % IV SOLN
500.0000 mL | Freq: Once | INTRAVENOUS | Status: AC
  Administered 2018-09-17: 500 mL via INTRAVENOUS

## 2018-09-17 MED ORDER — GLYCOPYRROLATE 0.2 MG/ML IJ SOLN: 0.2000 mg | Freq: Once | INTRAMUSCULAR | Status: DC

## 2018-09-17 MED ORDER — PROMETHAZINE HCL 25 MG/ML IJ SOLN
25.0000 mg | Freq: Once | INTRAMUSCULAR | Status: AC
  Administered 2018-09-17: 25 mg via INTRAVENOUS
  Filled 2018-09-17: qty 1

## 2018-09-17 MED ORDER — SODIUM CHLORIDE 0.9 % IV BOLUS
1000.0000 mL | Freq: Once | INTRAVENOUS | Status: AC
  Administered 2018-09-17: 1000 mL via INTRAVENOUS

## 2018-09-17 MED ORDER — INSULIN REGULAR(HUMAN) IN NACL 100-0.9 UT/100ML-% IV SOLN
INTRAVENOUS | Status: DC
  Administered 2018-09-17: 3.7 [IU]/h via INTRAVENOUS
  Filled 2018-09-17: qty 100

## 2018-09-17 MED ORDER — PANTOPRAZOLE SODIUM 40 MG PO TBEC
40.0000 mg | DELAYED_RELEASE_TABLET | Freq: Every day | ORAL | Status: DC
  Administered 2018-09-17 – 2018-09-19 (×3): 40 mg via ORAL
  Filled 2018-09-17 (×3): qty 1

## 2018-09-17 MED ORDER — DICYCLOMINE HCL 20 MG PO TABS
20.0000 mg | ORAL_TABLET | Freq: Two times a day (BID) | ORAL | Status: DC
  Administered 2018-09-17 – 2018-09-19 (×4): 20 mg via ORAL
  Filled 2018-09-17 (×4): qty 1

## 2018-09-17 MED ORDER — INSULIN REGULAR(HUMAN) IN NACL 100-0.9 UT/100ML-% IV SOLN: INTRAVENOUS | Status: DC

## 2018-09-17 MED ORDER — ENOXAPARIN SODIUM 40 MG/0.4ML ~~LOC~~ SOLN
40.0000 mg | SUBCUTANEOUS | Status: DC
  Administered 2018-09-17 – 2018-09-18 (×2): 40 mg via SUBCUTANEOUS
  Filled 2018-09-17 (×2): qty 0.4

## 2018-09-17 MED ORDER — ONDANSETRON HCL 4 MG/2ML IJ SOLN
4.0000 mg | Freq: Once | INTRAMUSCULAR | Status: AC
  Administered 2018-09-17: 4 mg via INTRAVENOUS
  Filled 2018-09-17: qty 2

## 2018-09-17 MED ORDER — DULOXETINE HCL 30 MG PO CPEP
30.0000 mg | ORAL_CAPSULE | Freq: Every day | ORAL | Status: DC
  Administered 2018-09-17 – 2018-09-19 (×3): 30 mg via ORAL
  Filled 2018-09-17 (×3): qty 1

## 2018-09-17 MED ORDER — SODIUM CHLORIDE 0.9 % IV SOLN
INTRAVENOUS | Status: DC
  Administered 2018-09-17: 19:00:00 via INTRAVENOUS

## 2018-09-17 MED ORDER — ZOLPIDEM TARTRATE 5 MG PO TABS
5.0000 mg | ORAL_TABLET | Freq: Every day | ORAL | Status: DC
  Administered 2018-09-18: 5 mg via ORAL
  Filled 2018-09-17: qty 1

## 2018-09-17 NOTE — ED Notes (Signed)
CBG 227 insulin gtt changed to 1.7 units /hr

## 2018-09-17 NOTE — ED Notes (Signed)
ED TO INPATIENT HANDOFF REPORT  ED Nurse Name and Phone #: [phone]  S Name/Age/Gender [name] 43 y.o. female Room/Bed: WA13/WA13  Code Status   Code Status: Full Code  Home/SNF/Other Home Patient oriented to: self, place, time and situation Is this baseline? Yes   Triage Complete: Triage complete  Chief Complaint DKA, High blood sugar  Triage Note Patient c/o elevated blood sugar since 0200 today. Patient also c/o N/V, abdominal pain and generalized aching. CBG-460.   Allergies Allergies  Allergen Reactions  . Gluten Meal Other (See Comments)    abdominal cramps Patient denies allergy  . Sulfa Antibiotics Nausea And Vomiting  . Wellbutrin [Bupropion] Other (See Comments)    "Makes me suicidal."  . Amoxicillin Rash    Has patient had a PCN reaction causing immediate rash, facial/tongue/throat swelling, SOB or lightheadedness with hypotension: Yes Has patient had a PCN reaction causing severe rash involving mucus membranes or skin necrosis: No Has patient had a PCN reaction that required hospitalization: No Has patient had a PCN reaction occurring within the last 10 years: No If all of the above answers are "NO", then may proceed with Cephalosporin use.   . Codeine Rash    Cannot take just codeine. Able to tolerate derivatives such as hydrocodone    Level of Care/Admitting Diagnosis ED Disposition    ED Disposition Condition Comment   Admit  Hospital Area: [facility] [[id number]]  Level of Care: Stepdown [14]  Admit to SDU based on following criteria: Hemodynamic compromise or significant risk of instability:  Patient requiring short term acute titration and management of vasoactive drips, and invasive monitoring (i.e., CVP and Arterial line).  Diagnosis: DKA, type 1 (HCC) [829562]  Admitting Physician: Rodolph Bong [[id number]]  Attending Physician: Ramiro Harvest V [[id number]]  Estimated length of stay: past midnight tomorrow  Certification:: I certify this patient will need inpatient services for at least 2 midnights  PT Class (Do Not Modify): Inpatient [101]  PT Acc Code (Do Not Modify): Private [1]       B Medical/Surgery History Past Medical History:  Diagnosis Date  . Adopted   . Cluster headache syndrome, intractable   . Depression   . Migraine without aura, with intractable migraine, so stated, without mention of status migrainosus 12/05/2012  . Ovarian cyst   . STD (sexually transmitted disease)    HSV II   . Type I diabetes mellitus (HCC)    Past Surgical History:  Procedure Laterality Date  . APPENDECTOMY    . BUNIONECTOMY Bilateral   . CHOLECYSTECTOMY    . EYE SURGERY    . OVARIAN CYST REMOVAL       A IV Location/Drains/Wounds Patient Lines/Drains/Airways Status   Active Line/Drains/Airways    Name:   Placement date:   Placement time:   Site:   Days:   Peripheral IV 07/21/18 Right Antecubital   07/21/18    1358    Antecubital   58   Peripheral IV 07/21/18 Left Hand   07/21/18    -    Hand   58   Peripheral IV 09/17/18 Right Antecubital   09/17/18    1645    Antecubital   less than 1   Peripheral IV 09/17/18 Left Antecubital   09/17/18    1710    Antecubital   less than 1   Airway   07/14/18    1135     65  Intake/Output Last 24 hours  Intake/Output Summary (Last 24 hours) at 09/17/2018 1956 Last data filed at 09/17/2018 1839 Gross per 24 hour  Intake 1100 ml  Output -  Net 1100 ml    Labs/Imaging Results for orders placed or performed during the hospital encounter of 09/17/18 (from the past 48 hour(s))  CBG monitoring, ED     Status: Abnormal   Collection Time: 09/17/18  4:10 PM  Result Value Ref Range   Glucose-Capillary 460 (H) 70 - 99 mg/dL  CBC     Status: Abnormal   Collection Time: 09/17/18  4:20 PM  Result Value Ref Range   WBC 16.8 (H) 4.0 - 10.5 K/uL   RBC 4.57 3.87 - 5.11 MIL/uL   Hemoglobin 13.3 12.0 - 15.0 g/dL   HCT 86.5 78.4 - 69.6 %   MCV 94.3  80.0 - 100.0 fL   MCH 29.1 26.0 - 34.0 pg   MCHC 30.9 30.0 - 36.0 g/dL   RDW 29.5 28.4 - 13.2 %   Platelets 368 150 - 400 K/uL   nRBC 0.0 0.0 - 0.2 %    Comment: Performed at [facility], [address], [place], [place] [address]  Comprehensive metabolic panel     Status: Abnormal   Collection Time: 09/17/18  4:24 PM  Result Value Ref Range   Sodium 130 (L) 135 - 145 mmol/L   Potassium 4.5 3.5 - 5.1 mmol/L   Chloride 97 (L) 98 - 111 mmol/L   CO2 13 (L) 22 - 32 mmol/L   Glucose, Bld 498 (H) 70 - 99 mg/dL   BUN 19 6 - 20 mg/dL   Creatinine, Ser 2.72 (H) 0.44 - 1.00 mg/dL   Calcium 9.0 8.9 - 53.6 mg/dL   Total Protein 7.7 6.5 - 8.1 g/dL   Albumin 4.5 3.5 - 5.0 g/dL   AST 20 15 - 41 U/L   ALT 17 0 - 44 U/L   Alkaline Phosphatase 62 38 - 126 U/L   Total Bilirubin 1.2 0.3 - 1.2 mg/dL   GFR calc non Af Amer >60 >60 mL/min   GFR calc Af Amer >60 >60 mL/min   Anion gap 20 (H) 5 - 15    Comment: Performed at [facility], [address] [place], [place] [address]  Lipase, blood     Status: None   Collection Time: 09/17/18  4:24 PM  Result Value Ref Range   Lipase 23 11 - 51 U/L    Comment: Performed at [facility], [address] [place], [place] [address]  I-Stat beta hCG blood, ED     Status: None   Collection Time: 09/17/18  4:33 PM  Result Value Ref Range   I-stat hCG, quantitative <5.0 <5 mIU/mL   Comment 3            Comment:   GEST. AGE      CONC.  (mIU/mL)   <=1 WEEK        5 - 50     2 WEEKS       50 - 500     3 WEEKS       100 - 10,000     4 WEEKS     1,000 - 30,000        FEMALE AND NON-PREGNANT FEMALE:     LESS THAN 5 mIU/mL   POCT I-Stat EG7     Status: Abnormal   Collection Time: 09/17/18  4:34 PM  Result Value Ref Range   pH, Ven 7.249 (L) 7.250 - 7.430   pCO2, Ven 32.3 (L) 44.0 - 60.0 mmHg   pO2, Ven 50.0 (H) 32.0 - 45.0 mmHg   Bicarbonate 14.1 (L) 20.0 - 28.0 mmol/L   TCO2 15 (L) 22 - 32 mmol/L    O2 Saturation 79.0 %   Acid-base deficit 12.0 (H) 0.0 - 2.0 mmol/L   Sodium 129 (L) 135 - 145 mmol/L   Potassium 4.6 3.5 - 5.1 mmol/L   Calcium, Ion 1.15 1.15 - 1.40 mmol/L   HCT 42.0 36.0 - 46.0 %   Hemoglobin 14.3 12.0 - 15.0 g/dL   Patient temperature HIDE    Sample type VENOUS   CBG monitoring, ED     Status: Abnormal   Collection Time: 09/17/18  5:16 PM  Result Value Ref Range   Glucose-Capillary 430 (H) 70 - 99 mg/dL  CBG monitoring, ED     Status: Abnormal   Collection Time: 09/17/18  6:31 PM  Result Value Ref Range   Glucose-Capillary 320 (H) 70 - 99 mg/dL  CBG monitoring, ED     Status: Abnormal   Collection Time: 09/17/18  7:43 PM  Result Value Ref Range   Glucose-Capillary 227 (H) 70 - 99 mg/dL   Dg Chest 2 View  Result Date: 09/17/2018 CLINICAL DATA:  Elevated blood sugar since 0200 hours today, nausea, vomiting, abdominal pain, generalized aching, history diabetes mellitus EXAM: CHEST - 2 VIEW COMPARISON:  07/21/2018 FINDINGS: Normal heart size, mediastinal contours, and pulmonary vascularity. Lungs clear. No pleural effusion or pneumothorax. Bones unremarkable. IMPRESSION: No acute abnormalities. Electronically Signed   By: Ulyses Southward M.D.   On: 09/17/2018 18:53    Pending Labs Unresulted Labs (From admission, onward)    Start     Ordered   09/18/18 0500  CBC with Differential/Platelet  Tomorrow morning,   R     09/17/18 1944   09/17/18 1854  Magnesium  Add-on,   R     09/17/18 1854   09/17/18 1854  Phosphorus  Add-on,   R     09/17/18 1854   09/17/18 1854  Hemoglobin A1c  Add-on,   R     09/17/18 1854   09/17/18 1854  Troponin I - Add-On to previous collection  Add-on,   R     09/17/18 1854   09/17/18 1811  Pregnancy, urine  Once,   R     09/17/18 1811   09/17/18 1810  Beta-hydroxybutyric acid  Once,   R     09/17/18 1811   09/17/18 1809  Basic metabolic panel  STAT Now then every 4 hours ,   STAT     09/17/18 1811   09/17/18 1731  Culture, Urine  Once,    R     09/17/18 1730   09/17/18 1617  Urinalysis, Routine w reflex microscopic  Once,   STAT     09/17/18 1616          Vitals/Pain Today's Vitals   09/17/18 1700 09/17/18 1730 09/17/18 1800 09/17/18 1830  BP: 135/60 131/64 120/85 137/63  Pulse: (!) 124 (!) 120 (!) 113 (!) 111  Resp: 17  19 18   Temp:      TempSrc:      SpO2: 97% 97% 98% 98%  Weight:      Height:        Isolation Precautions No active isolations  Medications Medications  0.9 %  sodium chloride infusion ( Intravenous  Transfusing/Transfer 09/17/18 1927)  dextrose 5 %-0.45 % sodium chloride infusion (has no administration in time range)  insulin regular, human (MYXREDLIN) 100 units/ 100 mL infusion ( Intravenous Transfusing/Transfer 09/17/18 1955)  enoxaparin (LOVENOX) injection 40 mg (has no administration in time range)  sodium chloride 0.9 % bolus 1,000 mL (1,000 mLs Intravenous Transfusing/Transfer 09/17/18 1926)  promethazine (PHENERGAN) injection 25 mg (25 mg Intravenous Given 09/17/18 1639)  potassium chloride 10 mEq in 100 mL IVPB (10 mEq Intravenous Transfusing/Transfer 09/17/18 1926)  pantoprazole (PROTONIX) EC tablet 40 mg (40 mg Oral Given 09/17/18 1726)  sodium chloride 0.9 % bolus 1,000 mL (0 mLs Intravenous Stopped 09/17/18 1839)    Mobility walks Low fall risk   Focused Assessments hyperglycemia   R Recommendations: See Admitting Provider Note  Report given to:   Additional Notes: condition stable

## 2018-09-17 NOTE — ED Notes (Signed)
Patient aware we need urine sample. Patient states she voided before arriving and does not have to go at this time. Patient will try again after fluids are finished.

## 2018-09-17 NOTE — ED Notes (Signed)
Attempted to call report. No answer.

## 2018-09-17 NOTE — ED Provider Notes (Signed)
[facility] DEPT Provider Note   CSN: [id number] Arrival date & time: 09/17/18  1557    History   Chief Complaint Chief Complaint  Patient presents with  . Hyperglycemia  . Emesis  . Abdominal Pain    HPI [name] is a 43 y.o. female hx of type 1 DM, depression, here presenting with possible DKA.  Patient states that 2 days ago she saw her primary care doctor for hyperglycemia and urinary frequency.  Her urinalysis was normal at that time and she was given a medicine for bladder spasms.  Patient states that she has a insulin pump and that seems to be malfunctioning.  She states that since 2 AM, her blood sugar has been running in the 400s.  She has given herself multiple shots of insulin but her blood sugar has remained elevated.  She also has persistent vomiting and states that she is dehydrated.  Denies any fevers or chills.      The history is provided by the patient.    Past Medical History:  Diagnosis Date  . Adopted   . Cluster headache syndrome, intractable   . Depression   . Migraine without aura, with intractable migraine, so stated, without mention of status migrainosus 12/05/2012  . Ovarian cyst   . STD (sexually transmitted disease)    HSV II   . Type I diabetes mellitus [facility])     Patient Active Problem List   Diagnosis Date Noted  . Dehydration 09/17/2018  . Gastroesophageal reflux disease   . ADHD   . Volume depletion 07/21/2018  . Diabetic ketoacidosis ([place]) 07/21/2018  . Morbid obesity ([place]) 06/16/2018  . Chronic tension-type headache, intractable 06/03/2018  . Multiple thyroid nodules 04/17/2018  . 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 DKA, type 1 ([place]) 02/14/2015  . DKA (diabetic ketoacidoses) ([place]) 02/14/2015  . Thyroid nodule 02/14/2015  . Depression with anxiety 02/14/2015  . Acute kidney injury ([place]) 02/14/2015  . Leukocytosis 02/14/2015  . Increased anion gap metabolic acidosis  63/84/6659  . History of frequent urinary tract infections 01/25/2014  . Anxiety 12/04/2013  . Neuropathic pain 12/04/2013  . Recurrent UTI 12/04/2013  . Aphakia of left eye 06/23/2013  . Retinal detachment 06/23/2013  . PDR (proliferative diabetic retinopathy) ([place]) 06/23/2013  . Recurrent major depression-severe ([place]) 01/06/2013  . Generalized anxiety disorder 01/06/2013  . Common migraine with intractable migraine 12/05/2012  . IDDM (insulin dependent diabetes mellitus) ([place]) 12/05/2012  . RLQ abdominal pain 11/30/2011    Past Surgical History:  Procedure Laterality Date  . APPENDECTOMY    . BUNIONECTOMY Bilateral   . CHOLECYSTECTOMY    . EYE SURGERY    . OVARIAN CYST REMOVAL       OB History    Gravida  0   Para  0   Term  0   Preterm  0   AB  0   Living  0     SAB  0   TAB  0   Ectopic  0   Multiple  0   Live Births  0            Home Medications    Prior to Admission medications   Medication Sig Start Date End Date Taking? Authorizing Provider  amphetamine-dextroamphetamine (ADDERALL) 20 MG tablet Take 20 mg by mouth 3 (three) times daily.  10/24/17  Yes [provider]  benzonatate (TESSALON) 100 MG capsule Take 100 mg by mouth  3 (three) times daily as needed for cough.  08/26/18  Yes [provider]  clonazePAM (KLONOPIN) 0.5 MG tablet Take 0.5 mg by mouth 3 (three) times daily as needed for anxiety. For anxiety 01/09/13  Yes Niel Hummer, NP  dicyclomine (BENTYL) 20 MG tablet Take 1 tablet (20 mg total) by mouth 2 (two) times daily. 04/01/18  Yes Drenda Freeze, MD  DULoxetine (CYMBALTA) 30 MG capsule Take 30 mg by mouth daily.  09/09/18  Yes [provider]  Fremanezumab-vfrm (AJOVY) 225 MG/1.5ML SOSY Inject 1 pen into the skin every 30 (thirty) days.   Yes [provider]  gabapentin (NEURONTIN) 300 MG capsule Take 300 mg by mouth 3 (three) times daily.  08/27/18  Yes [provider]   HYDROmorphone (DILAUDID) 2 MG tablet Take 1 tablet (2 mg total) by mouth every 4 (four) hours as needed. 08/25/18  Yes Salvadore Dom, MD  levonorgestrel [facility]) 20 MCG/24HR IUD 1 each by Intrauterine route once. Placed 06/2014   Yes [provider]  misoprostol (CYTOTEC) 200 MCG tablet Place 2 tablets vaginally 6-12 hours prior to IUD insertion 08/25/18  Yes Salvadore Dom, MD  ondansetron (ZOFRAN-ODT) 4 MG disintegrating tablet Take 4 mg by mouth every 8 (eight) hours as needed for nausea or vomiting.  09/10/18  Yes [provider]  promethazine (PHENERGAN) 25 MG tablet Take 1 tablet (25 mg total) by mouth every 6 (six) hours as needed for nausea or vomiting. 08/27/18  Yes Kathrynn Ducking, MD  REXULTI 2 MG TABS Take 2 mg by mouth daily. 08/02/18  Yes [provider]  SPRAVATO, 84 MG DOSE, 28 MG/DEVICE SOPK Take 84 mg by mouth 2 (two) times a week.  09/17/18  Yes [provider]  traZODone (DESYREL) 150 MG tablet Take 150 mg by mouth at bedtime as needed for sleep.  08/29/18  Yes [provider]  zolpidem (AMBIEN) 10 MG tablet Take 10 mg by mouth at bedtime.    Yes [provider]  blood glucose meter kit and supplies KIT Dispense based on patient and insurance preference. Use up to four times daily as directed. (FOR ICD-9 250.00, 250.01). 07/23/18   Kayleen Memos, DO  ibuprofen (ADVIL,MOTRIN) 800 MG tablet Take 1 tablet (800 mg total) by mouth every 8 (eight) hours as needed. Patient taking differently: Take 800 mg by mouth every 8 (eight) hours as needed for moderate pain.  08/25/18   Salvadore Dom, MD  insulin aspart (NOVOLOG) 100 UNIT/ML injection Inject 0-9 Units into the skin 3 (three) times daily with meals. 09/19/18   Eugenie Filler, MD  Insulin Aspart, w/Niacinamide, (FIASP FLEXTOUCH) 100 UNIT/ML SOPN Inject 1-8 Units into the skin 3 (three) times daily. 09/19/18   Eugenie Filler, MD  Insulin Glargine (BASAGLAR KWIKPEN)  100 UNIT/ML SOPN Inject 0.22 mLs (22 Units total) into the skin daily. 09/19/18   Eugenie Filler, MD  Insulin Pen Needle 31G X 5 MM MISC 1-8 Units by Does not apply route 3 (three) times daily. 09/19/18   Eugenie Filler, MD  pantoprazole (PROTONIX) 40 MG tablet Take 1 tablet (40 mg total) by mouth daily. 09/19/18   Eugenie Filler, MD    Family History Family History  Adopted: Yes  Problem Relation Age of Onset  . Diabetes Father     Social History Social History   Tobacco Use  . Smoking status: Never Smoker  . Smokeless tobacco: Never Used  Substance  Use Topics  . Alcohol use: Yes    Comment: Once a month   . Drug use: No     Allergies   Sulfa antibiotics; Wellbutrin [bupropion]; Amoxicillin; and Codeine   Review of Systems Review of Systems  Gastrointestinal: Positive for abdominal pain and vomiting.  All other systems reviewed and are negative.    Physical Exam Updated Vital Signs BP 130/68 (BP Location: Right Arm)   Pulse 67   Temp 98.5 F (36.9 C) (Oral)   Resp 20   Ht 5' 1"  (1.549 m)   Wt 76.6 kg   LMP 08/27/2018   SpO2 96%   BMI 31.91 kg/m   Physical Exam Vitals signs and nursing note reviewed.  Constitutional:      Appearance: She is well-developed.  HENT:     Head: Normocephalic.     Comments: MM slightly dry  Cardiovascular:     Rate and Rhythm: Regular rhythm. Tachycardia present.     Heart sounds: Normal heart sounds.  Pulmonary:     Effort: Pulmonary effort is normal.     Breath sounds: Normal breath sounds.  Abdominal:     General: Abdomen is flat. Bowel sounds are normal.     Palpations: Abdomen is soft.  Skin:    General: Skin is warm.     Capillary Refill: Capillary refill takes less than 2 seconds.  Neurological:     General: No focal deficit present.     Mental Status: She is alert and oriented to person, place, and time.  Psychiatric:        Mood and Affect: Mood normal.        Behavior: Behavior normal.       ED Treatments / Results  Labs (all labs ordered are listed, but only abnormal results are displayed) Labs Reviewed  CBC - Abnormal; Notable for the following components:      Result Value   WBC 16.8 (*)    All other components within normal limits  URINALYSIS, ROUTINE W REFLEX MICROSCOPIC - Abnormal; Notable for the following components:   Glucose, UA 50 (*)    Ketones, ur 20 (*)    All other components within normal limits  COMPREHENSIVE METABOLIC PANEL - Abnormal; Notable for the following components:   Sodium 130 (*)    Chloride 97 (*)    CO2 13 (*)    Glucose, Bld 498 (*)    Creatinine, Ser 1.08 (*)    Anion gap 20 (*)    All other components within normal limits  BASIC METABOLIC PANEL - Abnormal; Notable for the following components:   Sodium 134 (*)    CO2 16 (*)    Glucose, Bld 244 (*)    Calcium 7.9 (*)    All other components within normal limits  BETA-HYDROXYBUTYRIC ACID - Abnormal; Notable for the following components:   Beta-Hydroxybutyric Acid 2.76 (*)    All other components within normal limits  PHOSPHORUS - Abnormal; Notable for the following components:   Phosphorus 2.3 (*)    All other components within normal limits  HEMOGLOBIN A1C - Abnormal; Notable for the following components:   Hgb A1c MFr Bld 7.5 (*)    All other components within normal limits  CBC WITH DIFFERENTIAL/PLATELET - Abnormal; Notable for the following components:   RBC 3.55 (*)    Hemoglobin 10.6 (*)    HCT 34.1 (*)    All other components within normal limits  GLUCOSE, CAPILLARY - Abnormal; Notable for the following  components:   Glucose-Capillary 206 (*)    All other components within normal limits  BASIC METABOLIC PANEL - Abnormal; Notable for the following components:   Chloride 114 (*)    CO2 17 (*)    Glucose, Bld 161 (*)    Calcium 7.4 (*)    All other components within normal limits  BASIC METABOLIC PANEL - Abnormal; Notable for the following components:   Chloride 114  (*)    CO2 14 (*)    Glucose, Bld 182 (*)    Calcium 7.0 (*)    All other components within normal limits  GLUCOSE, CAPILLARY - Abnormal; Notable for the following components:   Glucose-Capillary 233 (*)    All other components within normal limits  GLUCOSE, CAPILLARY - Abnormal; Notable for the following components:   Glucose-Capillary 172 (*)    All other components within normal limits  GLUCOSE, CAPILLARY - Abnormal; Notable for the following components:   Glucose-Capillary 164 (*)    All other components within normal limits  GLUCOSE, CAPILLARY - Abnormal; Notable for the following components:   Glucose-Capillary 128 (*)    All other components within normal limits  GLUCOSE, CAPILLARY - Abnormal; Notable for the following components:   Glucose-Capillary 102 (*)    All other components within normal limits  GLUCOSE, CAPILLARY - Abnormal; Notable for the following components:   Glucose-Capillary 133 (*)    All other components within normal limits  GLUCOSE, CAPILLARY - Abnormal; Notable for the following components:   Glucose-Capillary 143 (*)    All other components within normal limits  GLUCOSE, CAPILLARY - Abnormal; Notable for the following components:   Glucose-Capillary 188 (*)    All other components within normal limits  BASIC METABOLIC PANEL - Abnormal; Notable for the following components:   Chloride 113 (*)    CO2 19 (*)    Glucose, Bld 142 (*)    Calcium 7.2 (*)    All other components within normal limits  BASIC METABOLIC PANEL - Abnormal; Notable for the following components:   Sodium 134 (*)    Potassium 3.4 (*)    CO2 18 (*)    Glucose, Bld 146 (*)    BUN 5 (*)    Calcium 7.3 (*)    All other components within normal limits  GLUCOSE, CAPILLARY - Abnormal; Notable for the following components:   Glucose-Capillary 188 (*)    All other components within normal limits  GLUCOSE, CAPILLARY - Abnormal; Notable for the following components:   Glucose-Capillary  147 (*)    All other components within normal limits  IRON AND TIBC - Abnormal; Notable for the following components:   TIBC 206 (*)    All other components within normal limits  GLUCOSE, CAPILLARY - Abnormal; Notable for the following components:   Glucose-Capillary 140 (*)    All other components within normal limits  GLUCOSE, CAPILLARY - Abnormal; Notable for the following components:   Glucose-Capillary 122 (*)    All other components within normal limits  GLUCOSE, CAPILLARY - Abnormal; Notable for the following components:   Glucose-Capillary 131 (*)    All other components within normal limits  GLUCOSE, CAPILLARY - Abnormal; Notable for the following components:   Glucose-Capillary 135 (*)    All other components within normal limits  GLUCOSE, CAPILLARY - Abnormal; Notable for the following components:   Glucose-Capillary 196 (*)    All other components within normal limits  GLUCOSE, CAPILLARY - Abnormal; Notable for the following components:  Glucose-Capillary 206 (*)    All other components within normal limits  BASIC METABOLIC PANEL - Abnormal; Notable for the following components:   Chloride 112 (*)    Glucose, Bld 182 (*)    Calcium 8.0 (*)    All other components within normal limits  CBC - Abnormal; Notable for the following components:   RBC 3.80 (*)    Hemoglobin 11.2 (*)    HCT 35.2 (*)    All other components within normal limits  GLUCOSE, CAPILLARY - Abnormal; Notable for the following components:   Glucose-Capillary 172 (*)    All other components within normal limits  GLUCOSE, CAPILLARY - Abnormal; Notable for the following components:   Glucose-Capillary 133 (*)    All other components within normal limits  GLUCOSE, CAPILLARY - Abnormal; Notable for the following components:   Glucose-Capillary 244 (*)    All other components within normal limits  CBG MONITORING, ED - Abnormal; Notable for the following components:   Glucose-Capillary 460 (*)    All  other components within normal limits  CBG MONITORING, ED - Abnormal; Notable for the following components:   Glucose-Capillary 430 (*)    All other components within normal limits  POCT I-STAT EG7 - Abnormal; Notable for the following components:   pH, Ven 7.249 (*)    pCO2, Ven 32.3 (*)    pO2, Ven 50.0 (*)    Bicarbonate 14.1 (*)    TCO2 15 (*)    Acid-base deficit 12.0 (*)    Sodium 129 (*)    All other components within normal limits  CBG MONITORING, ED - Abnormal; Notable for the following components:   Glucose-Capillary 320 (*)    All other components within normal limits  CBG MONITORING, ED - Abnormal; Notable for the following components:   Glucose-Capillary 227 (*)    All other components within normal limits  MRSA PCR SCREENING  URINE CULTURE  LIPASE, BLOOD  MAGNESIUM  TROPONIN I  PREGNANCY, URINE  VITAMIN B12  FOLATE  FERRITIN  GLUCOSE, CAPILLARY  I-STAT BETA HCG BLOOD, ED ([facility], [facility], [facility] ONLY)    EKG EKG Interpretation  Date/Time:  Wednesday September 17 2018 18:44:48 EDT Ventricular Rate:  109 PR Interval:    QRS Duration: 76 QT Interval:  315 QTC Calculation: 425 R Axis:   86 Text Interpretation:  Sinus tachycardia Borderline T abnormalities, inferior leads No significant change was found Confirmed by Shanon Rosser [phone]) on 09/18/2018 9:49:13 AM   Radiology Dg Chest 2 View  Result Date: 09/17/2018 CLINICAL DATA:  Elevated blood sugar since 0200 hours today, nausea, vomiting, abdominal pain, generalized aching, history diabetes mellitus EXAM: CHEST - 2 VIEW COMPARISON:  07/21/2018 FINDINGS: Normal heart size, mediastinal contours, and pulmonary vascularity. Lungs clear. No pleural effusion or pneumothorax. Bones unremarkable. IMPRESSION: No acute abnormalities. Electronically Signed   By: Lavonia Dana M.D.   On: 09/17/2018 18:53    Procedures Procedures (including critical care time)  CRITICAL CARE Performed by: Wandra Arthurs   Total critical care time: 40  minutes  Critical care time was exclusive of separately billable procedures and treating other patients.  Critical care was necessary to treat or prevent imminent or life-threatening deterioration.  Critical care was time spent personally by me on the following activities: development of treatment plan with patient and/or surrogate as well as nursing, discussions with consultants, evaluation of patient's response to treatment, examination of patient, obtaining history from patient or surrogate, ordering and performing treatments and interventions,  ordering and review of laboratory studies, ordering and review of radiographic studies, pulse oximetry and re-evaluation of patient's condition.   Medications Ordered in ED Medications  enoxaparin (LOVENOX) injection 40 mg (40 mg Subcutaneous Given 09/18/18 2131)  amphetamine-dextroamphetamine (ADDERALL) tablet 20 mg (20 mg Oral Given 09/19/18 1230)  DULoxetine (CYMBALTA) DR capsule 30 mg (30 mg Oral Given 09/19/18 0948)  Brexpiprazole TABS 2 mg (2 mg Oral Given 09/19/18 0947)  traZODone (DESYREL) tablet 150 mg (has no administration in time range)  zolpidem (AMBIEN) tablet 5 mg (5 mg Oral Given 09/18/18 2131)  dicyclomine (BENTYL) tablet 20 mg (20 mg Oral Given 09/19/18 0948)  pantoprazole (PROTONIX) EC tablet 40 mg (40 mg Oral Given 09/19/18 0950)  clonazePAM (KLONOPIN) tablet 0.5 mg (0.5 mg Oral Given 09/19/18 0951)  gabapentin (NEURONTIN) capsule 300 mg (300 mg Oral Given 09/19/18 0949)  acetaminophen (TYLENOL) tablet 650 mg (has no administration in time range)  ondansetron (ZOFRAN) injection 4 mg (has no administration in time range)  glycopyrrolate (ROBINUL) injection 0.2 mg (has no administration in time range)  ketorolac (TORADOL) 30 MG/ML injection 30 mg (has no administration in time range)  glycopyrrolate (ROBINUL) injection 0.2 mg (has no administration in time range)  ketorolac (TORADOL) 30 MG/ML injection 30 mg (has no administration in time  range)  MEDLINE mouth rinse (15 mLs Mouth Rinse Not Given 09/18/18 2049)  insulin glargine (LANTUS) injection 22 Units (22 Units Subcutaneous Given 09/19/18 0949)  insulin aspart (novoLOG) injection 0-9 Units (has no administration in time range)  insulin starter kit- pen needles (English) 1 kit (has no administration in time range)  sodium chloride 0.9 % bolus 1,000 mL (0 mLs Intravenous Stopped 09/17/18 2001)  promethazine (PHENERGAN) injection 25 mg (25 mg Intravenous Given 09/17/18 1639)  potassium chloride 10 mEq in 100 mL IVPB (0 mEq Intravenous Stopped 09/17/18 2001)  pantoprazole (PROTONIX) EC tablet 40 mg (40 mg Oral Given 09/17/18 1726)  sodium chloride 0.9 % bolus 1,000 mL (0 mLs Intravenous Stopped 09/17/18 1839)  0.9 %  sodium chloride infusion (0 mLs Intravenous Stopped 09/18/18 0821)  ondansetron (ZOFRAN) injection 4 mg (4 mg Intravenous Given 09/17/18 2043)  sodium chloride 0.9 % bolus 1,000 mL (0 mLs Intravenous Stopping Infusion hung by another clincian 09/18/18 0823)  sodium chloride 0.9 % bolus 1,000 mL (0 mLs Intravenous Stopping Infusion hung by another clincian 09/18/18 0823)  sodium chloride 0.9 % bolus 1,000 mL ( Intravenous Rate/Dose Verify 09/18/18 1023)  sodium chloride 0.45 % bolus 1,000 mL ( Intravenous Rate/Dose Verify 09/18/18 1023)  potassium chloride SA (K-DUR,KLOR-CON) CR tablet 40 mEq (40 mEq Oral Given 09/18/18 1644)     Initial Impression / Assessment and Plan / ED Course  I have reviewed the triage vital signs and the nursing notes.  Pertinent labs & imaging results that were available during my care of the patient were reviewed by me and considered in my medical decision making (see chart for details).       [name] is a 43 y.o. female here with vomiting, hyperglycemia. Likely early DKA since she is type 1 diabetic and her insulin pump malfunctioned. Will get VBG, labs, UA. Will hydrate and start IV insulin.   5 pm  Patient's CO2 is 16. AG is 20. PH  7.25. Started on IV insulin. Hospitalist to admit for DKA.    Final Clinical Impressions(s) / ED Diagnoses   Final diagnoses:  DKA, type 1 ([place])  IDDM (insulin dependent diabetes mellitus) ([place])  ED Discharge Orders         Ordered    insulin aspart (NOVOLOG) 100 UNIT/ML injection  3 times daily with meals     09/19/18 1147    pantoprazole (PROTONIX) 40 MG tablet  Daily     09/19/18 1147    Insulin Aspart, w/Niacinamide, (FIASP FLEXTOUCH) 100 UNIT/ML SOPN  3 times daily     09/19/18 1147    Insulin Glargine (BASAGLAR KWIKPEN) 100 UNIT/ML SOPN  Daily     09/19/18 1147    Insulin Pen Needle 31G X 5 MM MISC  3 times daily     09/19/18 1147    Increase activity slowly     09/19/18 1147    Diet Carb Modified     09/19/18 1147           Drenda Freeze, MD 09/19/18 1500

## 2018-09-17 NOTE — H&P (Signed)
History and Physical    [name] YPP:[id number] DOB: May 21, 1976 DOA: 09/17/2018  PCP: Mindi Curling, PA-C  Patient coming from: Home  Chief Complaint: My blood glucoses ranging from 400-600  HPI: [name] is a 43 y.o. female with medical history significant of diabetes type 1 x37 years on the insulin pump since 1997, history of migraine and cluster headaches, anxiety and depression, gastroesophageal reflux disease, ADHD who presents to the ED with elevated blood glucose levels.  Patient stated she was seen by PCP for urinary frequency, urinalysis which was done was normal at that time patient was given some medication for bladder spasms and sent home.  Patient stated that around 2 AM on the day of admission she noticed that her insulin pump was likely malfunctioning as her blood glucose levels have been ranging from the 400s to the 600s.  Patient with some associated dizziness, lightheadedness, some shortness of breath, nausea, 7 episodes of nonbloody, bilious emesis, generalized weakness.  Patient does endorse some increased urinary frequency.  Patient denies any fever, no chills, no chest pain, no diarrhea, no constipation, no dysuria, no melena, no hematemesis, no hematochezia, no syncopal episodes, no asymmetric weakness or numbness.  Patient does endorse upper abdominal pain.  Patient subsequently presented to the ED.  ED Course: Patient seen in the ED, noted to have a blood glucose level of 460.  Basic metabolic profile obtained had a sodium of 130, chloride of 97, bicarb of 13, glucose of 498, creatinine of 1.08 with a anion gap of 20.  Lipase levels was within normal limits.  LFTs were within normal limits.  CBC had a white count of 16.8, hemoglobin of 13.3, platelet count of 368.  Urinalysis pending.  Patient given a bolus of 1 L normal saline and placed on the glucose stabilizer.  Hospitalist were called to admit the patient for further evaluation and management.  Review of  Systems: As per HPI otherwise 10 point review of systems negative.   Past Medical History:  Diagnosis Date   Adopted    Cluster headache syndrome, intractable    Depression    Migraine without aura, with intractable migraine, so stated, without mention of status migrainosus 12/05/2012   Ovarian cyst    STD (sexually transmitted disease)    HSV II    Type I diabetes mellitus ([place])     Past Surgical History:  Procedure Laterality Date   APPENDECTOMY     BUNIONECTOMY Bilateral    CHOLECYSTECTOMY     EYE SURGERY     OVARIAN CYST REMOVAL       reports that she has never smoked. She has never used smokeless tobacco. She reports current alcohol use. She reports that she does not use drugs.  Allergies  Allergen Reactions   Gluten Meal Other (See Comments)    abdominal cramps Patient denies allergy   Sulfa Antibiotics Nausea And Vomiting   Wellbutrin [Bupropion] Other (See Comments)    "Makes me suicidal."   Amoxicillin Rash    Has patient had a PCN reaction causing immediate rash, facial/tongue/throat swelling, SOB or lightheadedness with hypotension: Yes Has patient had a PCN reaction causing severe rash involving mucus membranes or skin necrosis: No Has patient had a PCN reaction that required hospitalization: No Has patient had a PCN reaction occurring within the last 10 years: No If all of the above answers are "NO", then may proceed with Cephalosporin use.    Codeine Rash    Cannot take just  codeine. Able to tolerate derivatives such as hydrocodone    Family History  Adopted: Yes  Problem Relation Age of Onset   Diabetes Father    Patient states she was adopted.  Does not know of health history of her biological parents.  Prior to Admission medications   Medication Sig Start Date End Date Taking? Authorizing Provider  amphetamine-dextroamphetamine (ADDERALL) 20 MG tablet Take 20 mg by mouth 3 (three) times daily.  10/24/17  Yes [provider]   benzonatate (TESSALON) 100 MG capsule Take 100 mg by mouth 3 (three) times daily as needed for cough.  08/26/18  Yes [provider]  clonazePAM (KLONOPIN) 0.5 MG tablet Take 0.5 mg by mouth 3 (three) times daily as needed for anxiety. For anxiety 01/09/13  Yes Niel Hummer, NP  dicyclomine (BENTYL) 20 MG tablet Take 1 tablet (20 mg total) by mouth 2 (two) times daily. 04/01/18  Yes Drenda Freeze, MD  DULoxetine (CYMBALTA) 30 MG capsule Take 30 mg by mouth daily.  09/09/18  Yes [provider]  FIASP 100 UNIT/ML SOLN 40 Units. Insulin Pump: 40 units daily. 09/15/18  Yes [provider]  Fremanezumab-vfrm (AJOVY) 225 MG/1.5ML SOSY Inject 1 pen into the skin every 30 (thirty) days.   Yes [provider]  gabapentin (NEURONTIN) 300 MG capsule Take 300 mg by mouth 3 (three) times daily.  08/27/18  Yes [provider]  HYDROmorphone (DILAUDID) 2 MG tablet Take 1 tablet (2 mg total) by mouth every 4 (four) hours as needed. 08/25/18  Yes Salvadore Dom, MD  levonorgestrel [facility]) 20 MCG/24HR IUD 1 each by Intrauterine route once. Placed 06/2014   Yes [provider]  misoprostol (CYTOTEC) 200 MCG tablet Place 2 tablets vaginally 6-12 hours prior to IUD insertion 08/25/18  Yes Salvadore Dom, MD  ondansetron (ZOFRAN-ODT) 4 MG disintegrating tablet Take 4 mg by mouth every 8 (eight) hours as needed for nausea or vomiting.  09/10/18  Yes [provider]  pantoprazole (PROTONIX) 40 MG tablet Take 40 mg by mouth daily. 06/30/18  Yes [provider]  promethazine (PHENERGAN) 25 MG tablet Take 1 tablet (25 mg total) by mouth every 6 (six) hours as needed for nausea or vomiting. 08/27/18  Yes Kathrynn Ducking, MD  REXULTI 2 MG TABS Take 2 mg by mouth daily. 08/02/18  Yes [provider]  SPRAVATO, 84 MG DOSE, 28 MG/DEVICE SOPK Take 84 mg by mouth 2 (two) times a week.  09/17/18  Yes [provider]  traZODone  (DESYREL) 150 MG tablet Take 150 mg by mouth at bedtime as needed for sleep.  08/29/18  Yes [provider]  zolpidem (AMBIEN) 10 MG tablet Take 10 mg by mouth at bedtime.    Yes [provider]  blood glucose meter kit and supplies KIT Dispense based on patient and insurance preference. Use up to four times daily as directed. (FOR ICD-9 250.00, 250.01). 07/23/18   Kayleen Memos, DO  ibuprofen (ADVIL,MOTRIN) 800 MG tablet Take 1 tablet (800 mg total) by mouth every 8 (eight) hours as needed. Patient taking differently: Take 800 mg by mouth every 8 (eight) hours as needed for moderate pain.  08/25/18   Salvadore Dom, MD    Physical Exam: Vitals:   09/17/18 1700 09/17/18 1730 09/17/18 1800 09/17/18 1830  BP: 135/60 131/64 120/85 137/63  Pulse: (!) 124 (!) 120 (!) 113 (!) 111  Resp: _0 Temp:  TempSrc:      SpO2: 97% 97% 98% 98%  Weight:      Height:        Constitutional: NAD, calm, comfortable Vitals:   09/17/18 1700 09/17/18 1730 09/17/18 1800 09/17/18 1830  BP: 135/60 131/64 120/85 137/63  Pulse: (!) 124 (!) 120 (!) 113 (!) 111  Resp: _0 Temp:      TempSrc:      SpO2: 97% 97% 98% 98%  Weight:      Height:       Eyes: PERRLA on the left.  Right eye blindness., lids and conjunctivae normal ENMT: Mucous membranes are dry. Posterior pharynx clear of any exudate or lesions.Normal dentition.  Neck: normal, supple, no masses, no thyromegaly Respiratory: clear to auscultation bilaterally, no wheezing, no crackles. Normal respiratory effort. No accessory muscle use.  Cardiovascular: Tachycardic.  No murmurs rubs or gallops.  No lower extremity edema.  2+ pedal pulses.  No carotid bruits.  Abdomen: no tenderness, no masses palpated. No hepatosplenomegaly. Bowel sounds positive.  Musculoskeletal: no clubbing / cyanosis. No joint deformity upper and lower extremities. Good ROM, no contractures. Normal muscle tone.  Skin: no rashes, lesions,  ulcers. No induration Neurologic: CN 2-12 grossly intact. Sensation intact, DTR normal. Strength 5/5 in all 4.  Psychiatric: Normal judgment and insight. Alert and oriented x 3. Normal mood.   Labs on Admission: I have personally reviewed following labs and imaging studies  CBC: Recent Labs  Lab 09/17/18 1620 09/17/18 1634  WBC 16.8*  --   HGB 13.3 14.3  HCT 43.1 42.0  MCV 94.3  --   PLT 368  --    Basic Metabolic Panel: Recent Labs  Lab 09/17/18 1624 09/17/18 1634  NA 130* 129*  K 4.5 4.6  CL 97*  --   CO2 13*  --   GLUCOSE 498*  --   BUN 19  --   CREATININE 1.08*  --   CALCIUM 9.0  --    GFR: Estimated Creatinine Clearance: 63.7 mL/min (A) (by C-G formula based on SCr of 1.08 mg/dL (H)). Liver Function Tests: Recent Labs  Lab 09/17/18 1624  AST 20  ALT 17  ALKPHOS 62  BILITOT 1.2  PROT 7.7  ALBUMIN 4.5   Recent Labs  Lab 09/17/18 1624  LIPASE 23   No results for input(s): AMMONIA in the last 168 hours. Coagulation Profile: No results for input(s): INR, PROTIME in the last 168 hours. Cardiac Enzymes: No results for input(s): CKTOTAL, CKMB, CKMBINDEX, TROPONINI in the last 168 hours. BNP (last 3 results) No results for input(s): PROBNP in the last 8760 hours. HbA1C: No results for input(s): HGBA1C in the last 72 hours. CBG: Recent Labs  Lab 09/17/18 1610 09/17/18 1716 09/17/18 1831  GLUCAP 460* 430* 320*   Lipid Profile: No results for input(s): CHOL, HDL, LDLCALC, TRIG, CHOLHDL, LDLDIRECT in the last 72 hours. Thyroid Function Tests: No results for input(s): TSH, T4TOTAL, FREET4, T3FREE, THYROIDAB in the last 72 hours. Anemia Panel: No results for input(s): VITAMINB12, FOLATE, FERRITIN, TIBC, IRON, RETICCTPCT in the last 72 hours. Urine analysis:    Component Value Date/Time   COLORURINE YELLOW 07/22/2018 [place] 07/22/2018 0639   APPEARANCEUR Hazy 11/24/2013 1449   LABSPEC 1.008 07/22/2018 0639   LABSPEC 1.003  11/24/2013 1449   PHURINE 5.0 07/22/2018 0639   GLUCOSEU >=500 (A) 07/22/2018 0639   GLUCOSEU >=500 11/24/2013 1449   HGBUR LARGE (A) 07/22/2018 2395  BILIRUBINUR negative 08/28/2018 1608   BILIRUBINUR Negative 11/24/2013 1449   KETONESUR 80 (A) 07/22/2018 0639   PROTEINUR Negative 08/28/2018 1608   PROTEINUR NEGATIVE 07/22/2018 0639   UROBILINOGEN 0.2 08/28/2018 1608   UROBILINOGEN 0.2 02/14/2015 1557   NITRITE negative 08/28/2018 1608   NITRITE NEGATIVE 07/22/2018 0639   LEUKOCYTESUR Small (1+) (A) 08/28/2018 1608   LEUKOCYTESUR 1+ 11/24/2013 1449    Radiological Exams on Admission: Dg Chest 2 View  Result Date: 09/17/2018 CLINICAL DATA:  Elevated blood sugar since 0200 hours today, nausea, vomiting, abdominal pain, generalized aching, history diabetes mellitus EXAM: CHEST - 2 VIEW COMPARISON:  07/21/2018 FINDINGS: Normal heart size, mediastinal contours, and pulmonary vascularity. Lungs clear. No pleural effusion or pneumothorax. Bones unremarkable. IMPRESSION: No acute abnormalities. Electronically Signed   By: Lavonia Dana M.D.   On: 09/17/2018 18:53    EKG: Sinus tachycardia.  Some T wave inversion in lead III.  Assessment/Plan Principal Problem:   DKA, type 1 (HCC) Active Problems:   IDDM (insulin dependent diabetes mellitus) ([place])   Recurrent major depression-severe (HCC)   Generalized anxiety disorder   Depression with anxiety   Leukocytosis   Neuropathic pain   Dehydration   ADHD   1 DKA Questionable etiology.  Likely secondary to insulin pump malfunction.  Patient presented with nausea several episodes of emesis and blood glucose levels ranging from the 400s to the 600s per patient.  Patient denies any recent sick contacts or recent travel.  Patient on admission noted to have a leukocytosis with a white count of 16.8.  Check a chest x-ray.  Patient currently afebrile.  Urinalysis pending.  Patient noted to have an anion gap of 20.  Bicarb of 13.  Blood glucose  level of 498.  Sodium of 130.  VBG with a pH of 7.25, PCO2 of 33, PO2 of 50.  Place patient on the glucose stabilizer.  Check CBGs every 1 hour.  Check Bmet every 4 hours.  Cycle cardiac enzymes every 6 hours x3.  Check a hemoglobin A1c.  EKG with no specific ST-T wave abnormalities however with some T wave inversion in lead III only.  Patient denies any chest pain.  Hydrate aggressively with IV fluids.  Once anion gap is closed and acidosis resolved with blood glucose consistently less than 2503 will transition to Lantus and sliding scale insulin.  Keep n.p.o. for now.  May have ice chips.  Supportive care.  Consult with diabetic coordinator.  2.  Depression/anxiety Currently stable.  Resume home regimen of Klonopin, Cymbalta, Rexulti.  3.  Gastroesophageal reflux disease PPI.  4.  Leukocytosis Likely secondary to stress demargination secondary to problem #1.  Urinalysis pending.  Check a chest x-ray.  No need for antibiotics at this time.  Follow.  5.  ADHD Resume home regimen of Adderall.  6.  Dehydration IV fluids.  7.  Neuropathic pain Resume home regimen of Neurontin and Cymbalta.   DVT prophylaxis: Lovenox Code Status: Full Family Communication: Updated patient.  No family at bedside. Disposition Plan: Likely home once DKA has resolved, patient tolerating oral intake and no further nausea or vomiting. Consults called: None Admission status: Admit to inpatient.   Irine Seal MD [facility]  If 7PM-7AM, please contact night-coverage [email or web address]  09/17/2018, 7:44 PM

## 2018-09-17 NOTE — ED Triage Notes (Signed)
Patient c/o elevated blood sugar since 0200 today. Patient also c/o N/V, abdominal pain and generalized aching. CBG-460.

## 2018-09-18 LAB — GLUCOSE, CAPILLARY
GLUCOSE-CAPILLARY: 143 mg/dL — AB (ref 70–99)
GLUCOSE-CAPILLARY: 147 mg/dL — AB (ref 70–99)
Glucose-Capillary: 102 mg/dL — ABNORMAL HIGH (ref 70–99)
Glucose-Capillary: 122 mg/dL — ABNORMAL HIGH (ref 70–99)
Glucose-Capillary: 128 mg/dL — ABNORMAL HIGH (ref 70–99)
Glucose-Capillary: 131 mg/dL — ABNORMAL HIGH (ref 70–99)
Glucose-Capillary: 133 mg/dL — ABNORMAL HIGH (ref 70–99)
Glucose-Capillary: 135 mg/dL — ABNORMAL HIGH (ref 70–99)
Glucose-Capillary: 140 mg/dL — ABNORMAL HIGH (ref 70–99)
Glucose-Capillary: 164 mg/dL — ABNORMAL HIGH (ref 70–99)
Glucose-Capillary: 172 mg/dL — ABNORMAL HIGH (ref 70–99)
Glucose-Capillary: 188 mg/dL — ABNORMAL HIGH (ref 70–99)
Glucose-Capillary: 188 mg/dL — ABNORMAL HIGH (ref 70–99)
Glucose-Capillary: 196 mg/dL — ABNORMAL HIGH (ref 70–99)
Glucose-Capillary: 206 mg/dL — ABNORMAL HIGH (ref 70–99)

## 2018-09-18 LAB — BASIC METABOLIC PANEL
ANION GAP: 6 (ref 5–15)
ANION GAP: 6 (ref 5–15)
Anion gap: 6 (ref 5–15)
Anion gap: 9 (ref 5–15)
BUN: 12 mg/dL (ref 6–20)
BUN: 9 mg/dL (ref 6–20)
BUN: 7 mg/dL (ref 6–20)
BUN: 5 mg/dL — ABNORMAL LOW (ref 6–20)
CALCIUM: 7.4 mg/dL — AB (ref 8.9–10.3)
CO2: 17 mmol/L — ABNORMAL LOW (ref 22–32)
CO2: 19 mmol/L — ABNORMAL LOW (ref 22–32)
CO2: 18 mmol/L — ABNORMAL LOW (ref 22–32)
CO2: 14 mmol/L — AB (ref 22–32)
Calcium: 7 mg/dL — ABNORMAL LOW (ref 8.9–10.3)
Calcium: 7.2 mg/dL — ABNORMAL LOW (ref 8.9–10.3)
Calcium: 7.3 mg/dL — ABNORMAL LOW (ref 8.9–10.3)
Chloride: 114 mmol/L — ABNORMAL HIGH (ref 98–111)
Chloride: 114 mmol/L — ABNORMAL HIGH (ref 98–111)
Chloride: 113 mmol/L — ABNORMAL HIGH (ref 98–111)
Chloride: 110 mmol/L (ref 98–111)
Creatinine, Ser: 0.65 mg/dL (ref 0.44–1.00)
Creatinine, Ser: 0.64 mg/dL (ref 0.44–1.00)
Creatinine, Ser: 0.63 mg/dL (ref 0.44–1.00)
Creatinine, Ser: 0.68 mg/dL (ref 0.44–1.00)
GFR calc Af Amer: >60 mL/min (ref >60)
GFR calc Af Amer: >60 mL/min (ref >60)
GFR calc Af Amer: >60 mL/min (ref >60)
GFR calc non Af Amer: >60 mL/min (ref >60)
GFR calc non Af Amer: >60 mL/min (ref >60)
GFR calc non Af Amer: >60 mL/min (ref >60)
GFR calc non Af Amer: >60 mL/min (ref >60)
GLUCOSE: 146 mg/dL — AB (ref 70–99)
Glucose, Bld: 161 mg/dL — ABNORMAL HIGH (ref 70–99)
Glucose, Bld: 182 mg/dL — ABNORMAL HIGH (ref 70–99)
Glucose, Bld: 142 mg/dL — ABNORMAL HIGH (ref 70–99)
Potassium: 4 mmol/L (ref 3.5–5.1)
Potassium: 4.1 mmol/L (ref 3.5–5.1)
Potassium: 4 mmol/L (ref 3.5–5.1)
Potassium: 3.4 mmol/L — ABNORMAL LOW (ref 3.5–5.1)
Sodium: 137 mmol/L (ref 135–145)
Sodium: 137 mmol/L (ref 135–145)
Sodium: 138 mmol/L (ref 135–145)
Sodium: 134 mmol/L — ABNORMAL LOW (ref 135–145)

## 2018-09-18 LAB — CBC WITH DIFFERENTIAL/PLATELET
ABS IMMATURE GRANULOCYTES: 0.02 10*3/uL (ref 0.00–0.07)
Basophils Absolute: 0 10*3/uL (ref 0.0–0.1)
Basophils Relative: 1 %
Eosinophils Absolute: 0.1 10*3/uL (ref 0.0–0.5)
Eosinophils Relative: 2 %
HCT: 34.1 % — ABNORMAL LOW (ref 36.0–46.0)
Hemoglobin: 10.6 g/dL — ABNORMAL LOW (ref 12.0–15.0)
IMMATURE GRANULOCYTES: 0 %
Lymphocytes Relative: 35 %
Lymphs Abs: 2.8 10*3/uL (ref 0.7–4.0)
MCH: 29.9 pg (ref 26.0–34.0)
MCHC: 31.1 g/dL (ref 30.0–36.0)
MCV: 96.1 fL (ref 80.0–100.0)
Monocytes Absolute: 0.6 10*3/uL (ref 0.1–1.0)
Monocytes Relative: 7 %
NEUTROS ABS: 4.5 10*3/uL (ref 1.7–7.7)
NEUTROS PCT: 55 %
Platelets: 274 10*3/uL (ref 150–400)
RBC: 3.55 MIL/uL — ABNORMAL LOW (ref 3.87–5.11)
RDW: 12.7 % (ref 11.5–15.5)
WBC: 8.1 10*3/uL (ref 4.0–10.5)
nRBC: 0 % (ref 0.0–0.2)

## 2018-09-18 LAB — URINALYSIS, ROUTINE W REFLEX MICROSCOPIC
Bilirubin Urine: NEGATIVE
GLUCOSE, UA: 50 mg/dL — AB
Hgb urine dipstick: NEGATIVE
Ketones, ur: 20 mg/dL — AB
Leukocytes,Ua: NEGATIVE
Nitrite: NEGATIVE
Protein, ur: NEGATIVE mg/dL
Specific Gravity, Urine: 1.009 (ref 1.005–1.030)
pH: 5 (ref 5.0–8.0)

## 2018-09-18 LAB — IRON AND TIBC
Iron: 52 ug/dL (ref 28–170)
Saturation Ratios: 25 % (ref 10.4–31.8)
TIBC: 206 ug/dL — ABNORMAL LOW (ref 250–450)
UIBC: 154 ug/dL

## 2018-09-18 LAB — PREGNANCY, URINE: Preg Test, Ur: NEGATIVE

## 2018-09-18 LAB — VITAMIN B12: Vitamin B-12: 406 pg/mL (ref 180–914)

## 2018-09-18 LAB — FOLATE: Folate: 11 ng/mL (ref >5.9)

## 2018-09-18 LAB — FERRITIN: Ferritin: 50 ng/mL (ref 11–307)

## 2018-09-18 MED ORDER — ORAL CARE MOUTH RINSE
15.0000 mL | Freq: Two times a day (BID) | OROMUCOSAL | Status: DC
  Administered 2018-09-18: 15 mL via OROMUCOSAL

## 2018-09-18 MED ORDER — INSULIN GLARGINE 100 UNIT/ML ~~LOC~~ SOLN
22.0000 [IU] | Freq: Every day | SUBCUTANEOUS | Status: DC
  Administered 2018-09-18 – 2018-09-19 (×2): 22 [IU] via SUBCUTANEOUS
  Filled 2018-09-18 (×2): qty 0.22

## 2018-09-18 MED ORDER — POTASSIUM CHLORIDE CRYS ER 20 MEQ PO TBCR
40.0000 meq | EXTENDED_RELEASE_TABLET | Freq: Once | ORAL | Status: AC
  Administered 2018-09-18: 40 meq via ORAL
  Filled 2018-09-18: qty 2

## 2018-09-18 MED ORDER — SODIUM CHLORIDE 0.9 % IV BOLUS
1000.0000 mL | Freq: Once | INTRAVENOUS | Status: AC
  Administered 2018-09-18: 1000 mL via INTRAVENOUS

## 2018-09-18 MED ORDER — INSULIN ASPART 100 UNIT/ML ~~LOC~~ SOLN
0.0000 [IU] | Freq: Three times a day (TID) | SUBCUTANEOUS | Status: DC
  Administered 2018-09-18: 3 [IU] via SUBCUTANEOUS
  Administered 2018-09-18: 5 [IU] via SUBCUTANEOUS

## 2018-09-18 MED ORDER — SODIUM CHLORIDE 0.9 % IV SOLN
INTRAVENOUS | Status: DC
  Administered 2018-09-18 – 2018-09-19 (×3): via INTRAVENOUS

## 2018-09-18 MED ORDER — SODIUM CHLORIDE 0.45 % IV BOLUS
1000.0000 mL | Freq: Once | INTRAVENOUS | Status: AC
  Administered 2018-09-18: 1000 mL via INTRAVENOUS

## 2018-09-18 MED ORDER — SODIUM BICARBONATE 650 MG PO TABS
650.0000 mg | ORAL_TABLET | Freq: Two times a day (BID) | ORAL | Status: DC
  Administered 2018-09-18: 650 mg via ORAL
  Filled 2018-09-18: qty 1

## 2018-09-18 NOTE — Progress Notes (Addendum)
PROGRESS NOTE    [name]  ERX:[id number] DOB: 1975/08/25 DOA: 09/17/2018 PCP: Jordan Hawks, PA-C   Brief Narrative:  Patient is a pleasant 43 year old female medical history significant for type 1 diabetes x37 years on insulin pump since 1997, history of migraine headaches, anxiety depression, ADHD who presents to the ED with elevated blood glucose levels in the 400s to 600s, concerned that her insulin pump might be malfunctioning and recent history of urinary symptoms.  Patient noted to be in DKA with an anion gap of 20, bicarb of 13, ketonuria, glycosuria.  Patient admitted placed on the glucose stabilizer.   Assessment & Plan:   Principal Problem:   DKA, type 1 (HCC) Active Problems:   IDDM (insulin dependent diabetes mellitus) (HCC)   Recurrent major depression-severe (HCC)   Generalized anxiety disorder   Depression with anxiety   Leukocytosis   Neuropathic pain   Dehydration   ADHD  1 DKA Questionable etiology.  Likely secondary to insulin pump malfunction.  Patient presented with nausea several episodes of emesis and blood glucose levels ranging from the 400s to the 600s per patient.  Patient denies any recent sick contacts or recent travel.  Patient on admission noted to have a leukocytosis with a white count of 16.8.    Chest x-ray negative for any acute infiltrate.  Urinalysis unremarkable.  Patient afebrile. Patient noted to have an anion gap of 20.  Bicarb of 13.  Blood glucose level of 498.  Sodium of 130.  VBG with a pH of 7.25, PCO2 of 33, PO2 of 50.    Patient noted to have ketones in the urine as well as glucose in the urine.  Beta hydroxybutyric acid was elevated at 2.76.  Hemoglobin A1c of 7.5.  Patient started on the glucose stabilizer with improvement with blood glucose levels.  Anion gap closed however patient still acidotic with a bicarb of 14.  EKG with no significant ischemic changes.  Troponin was negative x1.  Continue glucose stabilizer until acidosis  has resolved.  Once acidosis is resolved and anion gap remains closed we will transition to subcutaneous Lantus at 25 units daily as well as sliding scale insulin.  Give 1 L normal saline bolus.  Continue aggressive IV fluid resuscitation.  Continue noncaloric beverages for now.  Diabetic coordinator following.   2.  Depression/anxiety Currently stable.    Continue current regimen of Klonopin, Cymbalta, Rexulti.  3.  Gastroesophageal reflux disease PPI.  4.  Leukocytosis Likely secondary to stress demargination secondary to problem #1.  Urinalysis unremarkable.  Chest x-ray negative for any acute infiltrate.  Leukocytosis trended down.  No need for antibiotics.  Follow.   5.  ADHD Continue home regimen of Adderall.  6.  Dehydration Continue IV fluids.  7.  Neuropathic pain Continue Neurontin and Cymbalta.  8.  Anemia Likely dilutional.  Patient with no overt bleeding.  Check an anemia panel.  Follow H&H.    DVT prophylaxis: Lovenox Code Status: Full Family Communication: Updated patient.  No family at bedside. Disposition Plan: Likely home when clinically improved and medically stable.   Consultants:   None  Procedures:   Chest x-ray 09/17/2018  Antimicrobials:   None   Subjective: Sitting up in bed.  Denies any further nausea or emesis this morning.  Stated had a bout of nausea last night.  Denies any shortness of breath.  No chest pain.  No abdominal pain.  Objective: Vitals:   09/18/18 0800 09/18/18 0822 09/18/18 0900 09/18/18  1000  BP: 127/71  122/67 (!) 128/55  Pulse: 85  86 85  Resp: 17  18 12   Temp:  98.4 F (36.9 C)    TempSrc:  Oral    SpO2: 96%  99% 97%  Weight:      Height:        Intake/Output Summary (Last 24 hours) at 09/18/2018 1035 Last data filed at 09/18/2018 1023 Gross per 24 hour  Intake 7638.36 ml  Output 2200 ml  Net 5438.36 ml   Filed Weights   09/17/18 1612 09/17/18 2013  Weight: 77.1 kg 75.2 kg    Examination:   General exam: Appears calm and comfortable  Respiratory system: Clear to auscultation. Respiratory effort normal. Cardiovascular system: S1 & S2 heard, RRR. No JVD, murmurs, rubs, gallops or clicks. No pedal edema. Gastrointestinal system: Abdomen is nondistended, soft and nontender. No organomegaly or masses felt. Normal bowel sounds heard. Central nervous system: Alert and oriented. No focal neurological deficits. Extremities: Symmetric 5 x 5 power. Skin: No rashes, lesions or ulcers Psychiatry: Judgement and insight appear normal. Mood & affect appropriate.     Data Reviewed: I have personally reviewed following labs and imaging studies  CBC: Recent Labs  Lab 09/17/18 1620 09/17/18 1634 09/18/18 0552  WBC 16.8*  --  8.1  NEUTROABS  --   --  4.5  HGB 13.3 14.3 10.6*  HCT 43.1 42.0 34.1*  MCV 94.3  --  96.1  PLT 368  --  274   Basic Metabolic Panel: Recent Labs  Lab 09/17/18 1624 09/17/18 1634 09/17/18 2030 09/18/18 0046 09/18/18 0552  NA 130* 129* 134* 137 137  K 4.5 4.6 4.5 4.0 4.1  CL 97*  --  109 114* 114*  CO2 13*  --  16* 17* 14*  GLUCOSE 498*  --  244* 161* 182*  BUN 19  --  16 12 9   CREATININE 1.08*  --  0.88 0.65 0.64  CALCIUM 9.0  --  7.9* 7.4* 7.0*  MG  --   --  2.1  --   --   PHOS  --   --  2.3*  --   --    GFR: Estimated Creatinine Clearance: 85 mL/min (by C-G formula based on SCr of 0.64 mg/dL). Liver Function Tests: Recent Labs  Lab 09/17/18 1624  AST 20  ALT 17  ALKPHOS 62  BILITOT 1.2  PROT 7.7  ALBUMIN 4.5   Recent Labs  Lab 09/17/18 1624  LIPASE 23   No results for input(s): AMMONIA in the last 168 hours. Coagulation Profile: No results for input(s): INR, PROTIME in the last 168 hours. Cardiac Enzymes: Recent Labs  Lab 09/17/18 2030  TROPONINI <0.03   BNP (last 3 results) No results for input(s): PROBNP in the last 8760 hours. HbA1C: Recent Labs    09/17/18 2030  HGBA1C 7.5*   CBG: Recent Labs  Lab 09/18/18 0601  09/18/18 0659 09/18/18 0810 09/18/18 0916 09/18/18 1016  GLUCAP 188* 188* 147* 140* 122*   Lipid Profile: No results for input(s): CHOL, HDL, LDLCALC, TRIG, CHOLHDL, LDLDIRECT in the last 72 hours. Thyroid Function Tests: No results for input(s): TSH, T4TOTAL, FREET4, T3FREE, THYROIDAB in the last 72 hours. Anemia Panel: No results for input(s): VITAMINB12, FOLATE, FERRITIN, TIBC, IRON, RETICCTPCT in the last 72 hours. Sepsis Labs: No results for input(s): PROCALCITON, LATICACIDVEN in the last 168 hours.  Recent Results (from the past 240 hour(s))  MRSA PCR Screening     Status: None  Collection Time: 09/17/18 10:14 PM  Result Value Ref Range Status   MRSA by PCR NEGATIVE NEGATIVE Final    Comment:        The GeneXpert MRSA Assay (FDA approved for NASAL specimens only), is one component of a comprehensive MRSA colonization surveillance program. It is not intended to diagnose MRSA infection nor to guide or monitor treatment for MRSA infections. Performed at [facility], [address] [place], [place] [address]          Radiology Studies: Dg Chest 2 View  Result Date: 09/17/2018 CLINICAL DATA:  Elevated blood sugar since 0200 hours today, nausea, vomiting, abdominal pain, generalized aching, history diabetes mellitus EXAM: CHEST - 2 VIEW COMPARISON:  07/21/2018 FINDINGS: Normal heart size, mediastinal contours, and pulmonary vascularity. Lungs clear. No pleural effusion or pneumothorax. Bones unremarkable. IMPRESSION: No acute abnormalities. Electronically Signed   By: Ulyses Southward M.D.   On: 09/17/2018 18:53        Scheduled Meds: . amphetamine-dextroamphetamine  20 mg Oral TID WC  . Brexpiprazole  2 mg Oral Daily  . dicyclomine  20 mg Oral BID  . DULoxetine  30 mg Oral Daily  . enoxaparin (LOVENOX) injection  40 mg Subcutaneous Q24H  . gabapentin  300 mg Oral TID  . glycopyrrolate  0.2 mg Intravenous Once  . glycopyrrolate  0.2 mg  Intravenous Once  . ketorolac  30 mg Intravenous Once  . ketorolac  30 mg Intravenous Once  . mouth rinse  15 mL Mouth Rinse BID  . pantoprazole  40 mg Oral Daily  . zolpidem  5 mg Oral QHS   Continuous Infusions: . sodium chloride Stopped (09/17/18 1903)  . dextrose 5 % and 0.45% NaCl 125 mL/hr at 09/18/18 1023  . insulin 2.6 Units/hr (09/18/18 0700)     LOS: 1 day    Time spent: 35 minutes    Ramiro Harvest, MD [facility]  If 7PM-7AM, please contact night-coverage [email or web address] 09/18/2018, 10:35 AM

## 2018-09-18 NOTE — Progress Notes (Addendum)
Inpatient Diabetes Program Recommendations  AACE/ADA: New Consensus Statement on Inpatient Glycemic Control (2015)  Target Ranges:  Prepandial:   less than 140 mg/dL      Peak postprandial:   less than 180 mg/dL (1-2 hours)      Critically ill patients:  140 - 180 mg/dL   Lab Results  Component Value Date   GLUCAP 147 (H) 09/18/2018   HGBA1C 7.5 (H) 09/17/2018    Review of Glycemic Control  Diabetes history: DM1 Outpatient Diabetes medications: Insulin pump Metronic 670 G Current orders for Inpatient glycemic control: IV insulin  Insulin pump: Basal - 0.85 units/H Bolus - 1:8 CHO ratio, CF 30 with goal of 120  Inpatient Diabetes Program Recommendations:   Patient was seen by DM coordinator on admission 07/22/18 and had followup appointment with endocrinologist Dr. Shawnee Knapp on 08/21/18 and was on insulin pump @ this time, but patient had left employment and attempting to get disability and ? Insulin coverage. Will speak with patient to clarify what she has been doing @ home.  When patient meets criteria for transition off of insulin drip: -Lantus 2 hours prior to discontinuation of drip.  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 Patient has insulin for her pump to last ?weeks so prefers to keep her pump on when she is discharged from the hospital. Shared that she had changed her insulin insertion site 3 times @ home prior to coming to the hospital but her CBGs did not decrease. Patient denies having any hardened areas on her abdomen from injections.  Patient plans to call Dr. Shawnee Knapp and discuss option of continuing on insulin pump versus changing to insulin injections. Patient prefers Evaristo Bury if changes to injections. Discussed patient assistance program with patient and also option of going  to [facility] if needed if she needs PCP.  Thank you, Billy Fischer. Ahamed Hofland, RN, MSN, CDE  Diabetes Coordinator Inpatient Glycemic Control Team Team Pager [phone] (8am-5pm) 09/18/2018 9:18 AM

## 2018-09-19 LAB — BASIC METABOLIC PANEL
Anion gap: 5 (ref 5–15)
BUN: 6 mg/dL (ref 6–20)
CO2: 23 mmol/L (ref 22–32)
Calcium: 8 mg/dL — ABNORMAL LOW (ref 8.9–10.3)
Chloride: 112 mmol/L — ABNORMAL HIGH (ref 98–111)
Creatinine, Ser: 0.67 mg/dL (ref 0.44–1.00)
GFR calc Af Amer: >60 mL/min (ref >60)
GFR calc non Af Amer: >60 mL/min (ref >60)
GLUCOSE: 182 mg/dL — AB (ref 70–99)
Potassium: 4 mmol/L (ref 3.5–5.1)
Sodium: 140 mmol/L (ref 135–145)

## 2018-09-19 LAB — GLUCOSE, CAPILLARY
Glucose-Capillary: 133 mg/dL — ABNORMAL HIGH (ref 70–99)
Glucose-Capillary: 244 mg/dL — ABNORMAL HIGH (ref 70–99)
Glucose-Capillary: 98 mg/dL (ref 70–99)

## 2018-09-19 LAB — CBC
HCT: 35.2 % — ABNORMAL LOW (ref 36.0–46.0)
Hemoglobin: 11.2 g/dL — ABNORMAL LOW (ref 12.0–15.0)
MCH: 29.5 pg (ref 26.0–34.0)
MCHC: 31.8 g/dL (ref 30.0–36.0)
MCV: 92.6 fL (ref 80.0–100.0)
Platelets: 272 10*3/uL (ref 150–400)
RBC: 3.8 MIL/uL — ABNORMAL LOW (ref 3.87–5.11)
RDW: 12.9 % (ref 11.5–15.5)
WBC: 7.6 10*3/uL (ref 4.0–10.5)
nRBC: 0 % (ref 0.0–0.2)

## 2018-09-19 LAB — URINE CULTURE

## 2018-09-19 MED ORDER — INSULIN ASPART (W/NIACINAMIDE) 100 UNIT/ML ~~LOC~~ SOPN: 1.0000 [IU] | PEN_INJECTOR | Freq: Three times a day (TID) | SUBCUTANEOUS | 0 refills | Status: AC

## 2018-09-19 MED ORDER — INSULIN STARTER KIT- PEN NEEDLES (ENGLISH): 1.0000 | Freq: Once | Status: DC

## 2018-09-19 MED ORDER — BASAGLAR KWIKPEN 100 UNIT/ML ~~LOC~~ SOPN: 22.0000 [IU] | PEN_INJECTOR | Freq: Every day | SUBCUTANEOUS | 0 refills | Status: DC

## 2018-09-19 MED ORDER — INSULIN PEN NEEDLE 31G X 5 MM MISC: 1.0000 [IU] | Freq: Three times a day (TID) | 0 refills | Status: AC

## 2018-09-19 MED ORDER — PANTOPRAZOLE SODIUM 40 MG PO TBEC: 40.0000 mg | DELAYED_RELEASE_TABLET | Freq: Every day | ORAL | 0 refills | Status: DC

## 2018-09-19 MED ORDER — INSULIN ASPART 100 UNIT/ML ~~LOC~~ SOLN: 0.0000 [IU] | Freq: Three times a day (TID) | SUBCUTANEOUS | 11 refills | Status: DC

## 2018-09-19 MED ORDER — INSULIN ASPART 100 UNIT/ML ~~LOC~~ SOLN: 0.0000 [IU] | Freq: Three times a day (TID) | SUBCUTANEOUS | Status: DC

## 2018-09-19 NOTE — Progress Notes (Addendum)
Inpatient Diabetes Program Recommendations  AACE/ADA: New Consensus Statement on Inpatient Glycemic Control (2015)  Target Ranges:  Prepandial:   less than 140 mg/dL      Peak postprandial:   less than 180 mg/dL (1-2 hours)      Critically ill patients:  140 - 180 mg/dL   Lab Results  Component Value Date   GLUCAP 98 09/19/2018   HGBA1C 7.5 (H) 09/17/2018    Review of Glycemic Control  Diabetes history: DM1 Outpatient Diabetes medications: Insulin pump Metronic 670 G Current orders for Inpatient glycemic control: Lantus 22 units + Novolog moderate correction tid  Inpatient Diabetes Program Recommendations:   Spoke with patient @ bedside. Patient prefers to stop her insulin pump @ discharge and do injections instead. Patient states she has spoken by phone to endocrinologist and he is ordering Tresiba pens for patient through Novo[employer]ord[employer] assistance program and patient has short acting insulin @ home, Patient currently has 1 Advice worker @ home. Discussed with Dr. Janee Morn & patient @ bedside. Patient states her insurance currently covers Basaglar and requests prescription along with insulin pen # [phone] prescription for D/C. Patient is comfortable with insulin pen administration and has been on pens previously. Patient aware she has received her Lantus 22 units this am and will start Basaglar tomorrow @ home.  Thank you, Billy Fischer. Slayter Moorhouse, RN, MSN, CDE  Diabetes Coordinator Inpatient Glycemic Control Team Team Pager [phone] (8am-5pm) 09/19/2018 9:15 AM

## 2018-09-19 NOTE — Discharge Summary (Signed)
Physician Discharge Summary  [name] DJT:[id number] DOB: 03/07/1976 DOA: 09/17/2018  PCP: Mindi Curling, PA-C  Admit date: 09/17/2018 Discharge date: 09/19/2018  Time spent: 50 minutes  Recommendations for Outpatient Follow-up:  1. Follow-up with Dr. Hartford Poli, endocrinology as scheduled on 10/01/2018.   Discharge Diagnoses:  Principal Problem:   DKA, type 1 ([place]) Active Problems:   IDDM (insulin dependent diabetes mellitus) ([place])   Recurrent major depression-severe ([place])   Generalized anxiety disorder   Depression with anxiety   Leukocytosis   Neuropathic pain   Dehydration   ADHD   Discharge Condition: Stable and improved  Diet recommendation: Carb modified diet  Filed Weights   09/17/18 2013 09/19/18 0023 09/19/18 0500  Weight: 75.2 kg 76.5 kg 76.6 kg    History of present illness:  [name] is a 43 y.o. female with medical history significant of diabetes type 1 x37 years on the insulin pump since 1997, history of migraine and cluster headaches, anxiety and depression, gastroesophageal reflux disease, ADHD who presents to the ED with elevated blood glucose levels.  Patient stated she was seen by PCP for urinary frequency, urinalysis which was done was normal at that time patient was given some medication for bladder spasms and sent home.  Patient stated that around 2 AM on the day of admission she noticed that her insulin pump was likely malfunctioning as her blood glucose levels have been ranging from the 400s to the 600s.  Patient with some associated dizziness, lightheadedness, some shortness of breath, nausea, 7 episodes of nonbloody, bilious emesis, generalized weakness.  Patient does endorse some increased urinary frequency.  Patient denies any fever, no chills, no chest pain, no diarrhea, no constipation, no dysuria, no melena, no hematemesis, no hematochezia, no syncopal episodes, no asymmetric weakness or numbness.  Patient does endorse upper abdominal pain.   Patient subsequently presented to the ED.  ED Course: Patient seen in the ED, noted to have a blood glucose level of 460.  Basic metabolic profile obtained had a sodium of 130, chloride of 97, bicarb of 13, glucose of 498, creatinine of 1.08 with a anion gap of 20.  Lipase levels was within normal limits.  LFTs were within normal limits.  CBC had a white count of 16.8, hemoglobin of 13.3, platelet count of 368.  Urinalysis pending.  Patient given a bolus of 1 L normal saline and placed on the glucose stabilizer.  Hospitalist were called to admit the patient for further evaluation and management.  Hospital Course:  1 DKA Questionable etiology. Likely secondary to insulin pump malfunction. Patient presented with nausea several episodes of emesis and blood glucose levels ranging from the 400s to the 600s per patient. Patient denied any recent sick contacts or recent travel. Patient on admission noted to have a leukocytosis with a white count of 16.8.   Chest x-ray negative for any acute infiltrate.  Urinalysis unremarkable.  Patient afebrile. Patient noted to have an anion gap of 20. Bicarb of 13. Blood glucose level of 498. Sodium of 130. VBG with a pH of 7.25, PCO2 of 33, PO2 of 50.   Patient noted to have ketones in the urine as well as glucose in the urine.  Beta hydroxybutyric acid was elevated at 2.76.  Hemoglobin A1c of 7.5.  Cardiac work-up was negative. Patient started on the glucose stabilizer with improvement with blood glucose levels.  Anion gap closed however patient initially remained acidotic was given more aggressive fluid resuscitation and 2 doses of  oral bicarb with resolution of acidosis.  Patient was placed on a carb modified diet which he tolerated.  Patient improved clinically had no further nausea or vomiting and was back to baseline by day of discharge.  Patient was transitioned to Lantus 25 units daily as well as sliding scale insulin during the hospitalization with better  blood glucose control.  Patient will be discharged home on 22 units of Basaglar and sliding scale insulin using fiasp.  Patient will follow-up closely with her endocrinologist in the outpatient setting.   2. Depression/anxiety Remained stable.  Patient maintained on home regimen of Klonopin, Cymbalta, Rexulti.  3. Gastroesophageal reflux disease Patient maintained on PPI.  4. Leukocytosis Likely secondary to stress demargination secondary to problem #1. Urinalysis unremarkable.  Chest x-ray negative for any acute infiltrate.  Leukocytosis trended down and had resolved by day of discharge.  Outpatient follow-up with.  5. ADHD Continued on home regimen of Adderall.  6. Dehydration Hydrated with IV fluids and was euvolemic by day of discharge.   7. Neuropathic pain Patient maintained on home regimen of Neurontin and Cymbalta.  8.  Anemia Likely dilutional.  Patient with no overt bleeding.  Anemia panel consistent with anemia of chronic disease.  H&H stable at 11.2.     Procedures:  Chest x-ray 09/17/2018  Consultations:  None  Discharge Exam: Vitals:   09/19/18 0024 09/19/18 0611  BP: 136/81 130/68  Pulse: 86 67  Resp:  20  Temp:  98.5 F (36.9 C)  SpO2: 99% 96%    General: NAD Cardiovascular: RRR Respiratory: CTAB  Discharge Instructions   Discharge Instructions    Diet Carb Modified   Complete by:  As directed    Increase activity slowly   Complete by:  As directed      Allergies as of 09/19/2018      Reactions   Sulfa Antibiotics Nausea And Vomiting   Wellbutrin [bupropion] Other (See Comments)   "Makes me suicidal."   Amoxicillin Rash   Has patient had a PCN reaction causing immediate rash, facial/tongue/throat swelling, SOB or lightheadedness with hypotension: Yes Has patient had a PCN reaction causing severe rash involving mucus membranes or skin necrosis: No Has patient had a PCN reaction that required hospitalization: No Has  patient had a PCN reaction occurring within the last 10 years: No If all of the above answers are "NO", then may proceed with Cephalosporin use.   Codeine Rash   Cannot take just codeine. Able to tolerate derivatives such as hydrocodone      Medication List    STOP taking these medications   Fiasp 100 UNIT/ML Soln Generic drug:  Insulin Aspart (w/Niacinamide) Replaced by:  Insulin Aspart (w/Niacinamide) 100 UNIT/ML Sopn     TAKE these medications   Ajovy 225 MG/1.5ML Sosy Generic drug:  Fremanezumab-vfrm Inject 1 pen into the skin every 30 (thirty) days.   amphetamine-dextroamphetamine 20 MG tablet Commonly known as:  ADDERALL Take 20 mg by mouth 3 (three) times daily.   Basaglar KwikPen 100 UNIT/ML Sopn Inject 0.22 mLs (22 Units total) into the skin daily.   benzonatate 100 MG capsule Commonly known as:  TESSALON Take 100 mg by mouth 3 (three) times daily as needed for cough.   blood glucose meter kit and supplies Kit Dispense based on patient and insurance preference. Use up to four times daily as directed. (FOR ICD-9 250.00, 250.01).   clonazePAM 0.5 MG tablet Commonly known as:  KLONOPIN Take 0.5 mg by mouth 3 (three)  times daily as needed for anxiety. For anxiety   dicyclomine 20 MG tablet Commonly known as:  BENTYL Take 1 tablet (20 mg total) by mouth 2 (two) times daily.   DULoxetine 30 MG capsule Commonly known as:  CYMBALTA Take 30 mg by mouth daily.   gabapentin 300 MG capsule Commonly known as:  NEURONTIN Take 300 mg by mouth 3 (three) times daily.   HYDROmorphone 2 MG tablet Commonly known as:  Dilaudid Take 1 tablet (2 mg total) by mouth every 4 (four) hours as needed.   ibuprofen 800 MG tablet Commonly known as:  ADVIL,MOTRIN Take 1 tablet (800 mg total) by mouth every 8 (eight) hours as needed. What changed:  reasons to take this   Insulin Aspart (w/Niacinamide) 100 UNIT/ML Sopn Commonly known as:  Fiasp FlexTouch Inject 1-8 Units into the  skin 3 (three) times daily. Replaces:  Fiasp 100 UNIT/ML Soln   insulin aspart 100 UNIT/ML injection Commonly known as:  novoLOG Inject 0-9 Units into the skin 3 (three) times daily with meals.   Insulin Pen Needle 31G X 5 MM Misc 1-8 Units by Does not apply route 3 (three) times daily.   levonorgestrel 20 MCG/24HR IUD Commonly known as:  MIRENA 1 each by Intrauterine route once. Placed 06/2014   misoprostol 200 MCG tablet Commonly known as:  CYTOTEC Place 2 tablets vaginally 6-12 hours prior to IUD insertion   ondansetron 4 MG disintegrating tablet Commonly known as:  ZOFRAN-ODT Take 4 mg by mouth every 8 (eight) hours as needed for nausea or vomiting.   pantoprazole 40 MG tablet Commonly known as:  PROTONIX Take 1 tablet (40 mg total) by mouth daily.   promethazine 25 MG tablet Commonly known as:  PHENERGAN Take 1 tablet (25 mg total) by mouth every 6 (six) hours as needed for nausea or vomiting.   Rexulti 2 MG Tabs Generic drug:  Brexpiprazole Take 2 mg by mouth daily.   Spravato (84 MG Dose) 28 MG/DEVICE Sopk Generic drug:  Esketamine HCl (84 MG Dose) Take 84 mg by mouth 2 (two) times a week.   traZODone 150 MG tablet Commonly known as:  DESYREL Take 150 mg by mouth at bedtime as needed for sleep.   zolpidem 10 MG tablet Commonly known as:  AMBIEN Take 10 mg by mouth at bedtime.      Allergies  Allergen Reactions  . Sulfa Antibiotics Nausea And Vomiting  . Wellbutrin [Bupropion] Other (See Comments)    "Makes me suicidal."  . Amoxicillin Rash    Has patient had a PCN reaction causing immediate rash, facial/tongue/throat swelling, SOB or lightheadedness with hypotension: Yes Has patient had a PCN reaction causing severe rash involving mucus membranes or skin necrosis: No Has patient had a PCN reaction that required hospitalization: No Has patient had a PCN reaction occurring within the last 10 years: No If all of the above answers are "NO", then may  proceed with Cephalosporin use.   . Codeine Rash    Cannot take just codeine. Able to tolerate derivatives such as hydrocodone   Follow-up [facility] Follow up.   Contact information: [name] [address] [phone]       Gerome Apley, MD Follow up on 10/01/2018.   Specialty:  Endocrinology Why:  f/u as scheduled Contact information: [address] [place] [address] [phone]  The results of significant diagnostics from this hospitalization (including imaging, microbiology, ancillary and laboratory) are listed below for reference.    Significant Diagnostic Studies: Dg Chest 2 View  Result Date: 09/17/2018 CLINICAL DATA:  Elevated blood sugar since 0200 hours today, nausea, vomiting, abdominal pain, generalized aching, history diabetes mellitus EXAM: CHEST - 2 VIEW COMPARISON:  07/21/2018 FINDINGS: Normal heart size, mediastinal contours, and pulmonary vascularity. Lungs clear. No pleural effusion or pneumothorax. Bones unremarkable. IMPRESSION: No acute abnormalities. Electronically Signed   By: Lavonia Dana M.D.   On: 09/17/2018 18:53    Microbiology: Recent Results (from the past 240 hour(s))  MRSA PCR Screening     Status: None   Collection Time: 09/17/18 10:14 PM  Result Value Ref Range Status   MRSA by PCR NEGATIVE NEGATIVE Final    Comment:        The GeneXpert MRSA Assay (FDA approved for NASAL specimens only), is one component of a comprehensive MRSA colonization surveillance program. It is not intended to diagnose MRSA infection nor to guide or monitor treatment for MRSA infections. Performed at [facility], [place] [address] [place], [place] [address]   Culture, Urine     Status: None   Collection Time: 09/18/18  6:06 AM  Result Value Ref Range Status   Specimen Description   Final    URINE, RANDOM Performed at  [facility] [address], [place], [place] [address]    Special Requests   Final    NONE Performed at [facility], [place] [address], [place], [place] [address]    Culture   Final    Multiple bacterial morphotypes present, none predominant. Suggest appropriate recollection if clinically indicated.   Report Status 09/19/2018 FINAL  Final     Labs: Basic Metabolic Panel: Recent Labs  Lab 09/17/18 2030 09/18/18 0046 09/18/18 0552 09/18/18 1127 09/18/18 1436 09/19/18 0325  NA 134* 137 137 138 134* 140  K 4.5 4.0 4.1 4.0 3.4* 4.0  CL 109 114* 114* 113* 110 112*  CO2 16* 17* 14* 19* 18* 23  GLUCOSE 244* 161* 182* 142* 146* 182*  BUN _0 5* 6  CREATININE 0.88 0.65 0.64 0.63 0.68 0.67  CALCIUM 7.9* 7.4* 7.0* 7.2* 7.3* 8.0*  MG 2.1  --   --   --   --   --   PHOS 2.3*  --   --   --   --   --    Liver Function Tests: Recent Labs  Lab 09/17/18 1624  AST 20  ALT 17  ALKPHOS 62  BILITOT 1.2  PROT 7.7  ALBUMIN 4.5   Recent Labs  Lab 09/17/18 1624  LIPASE 23   No results for input(s): AMMONIA in the last 168 hours. CBC: Recent Labs  Lab 09/17/18 1620 09/17/18 1634 09/18/18 0552 09/19/18 0325  WBC 16.8*  --  8.1 7.6  NEUTROABS  --   --  4.5  --   HGB 13.3 14.3 10.6* 11.2*  HCT 43.1 42.0 34.1* 35.2*  MCV 94.3  --  96.1 92.6  PLT 368  --  274 272   Cardiac Enzymes: Recent Labs  Lab 09/17/18 2030  TROPONINI <0.03   BNP: BNP (last 3 results) No results for input(s): BNP in the last 8760 hours.  ProBNP (last 3 results) No results for input(s): PROBNP in the last 8760 hours.  CBG: Recent Labs  Lab 09/18/18 1557 09/18/18 2046 09/19/18 0018 09/19/18 6945  09/19/18 1136  GLUCAP 206* 172* 133* 98 244*       Signed:  Irine Seal MD.  [facility] 09/19/2018, 11:48 AM

## 2018-09-19 NOTE — TOC Initial Note (Signed)
Transition of Care [facility]) - Initial/Assessment Note    Patient Details  Name: [name] MRN: [id number] Date of Birth: 12-Jul-1975  Transition of Care [facility]) CM/SW Contact:    Linden Tagliaferro, Meriam Sprague, RN Phone Number: 09/19/2018, 11:37 AM  Clinical Narrative:                 Pt states she has been working with [employer] on a charity care program because her insurance runs out at the end of this month. Pt states her MD has been giving her 3 month scripts for all her meds to carry her over until she gets another job.  Resources placed on AVS.  Expected Discharge Plan: Home/Self Care Barriers to Discharge: No Barriers Identified   Patient Goals and CMS Choice        Expected Discharge Plan and Services Expected Discharge Plan: Home/Self Care         Expected Discharge Date: (unknown)                        Prior Living Arrangements/Services                       Activities of Daily Living Home Assistive Devices/Equipment: Eyeglasses ADL Screening (condition at time of admission) Patient's cognitive ability adequate to safely complete daily activities?: Yes Is the patient deaf or have difficulty hearing?: No Does the patient have difficulty seeing, even when wearing glasses/contacts?: 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 Dehydration 09/17/2018  . Gastroesophageal reflux disease   . ADHD   . Volume  depletion 07/21/2018  . Diabetic ketoacidosis (HCC) 07/21/2018  . Morbid obesity (HCC) 06/16/2018  . Chronic tension-type headache, intractable 06/03/2018  . Multiple thyroid nodules 04/17/2018  . 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 DKA, type 1 ([facility]) 02/14/2015  . DKA (diabetic ketoacidoses) (HCC) 02/14/2015  . Thyroid nodule 02/14/2015  . Depression with anxiety 02/14/2015  . Acute kidney injury (HCC) 02/14/2015  . Leukocytosis 02/14/2015  . Increased anion gap metabolic acidosis 02/14/2015  . History of frequent urinary tract infections 01/25/2014  . Anxiety 12/04/2013  . Neuropathic pain 12/04/2013  . Recurrent UTI 12/04/2013  . Aphakia of left eye 06/23/2013  . Retinal detachment 06/23/2013  . PDR (proliferative diabetic retinopathy) (HCC) 06/23/2013  . Recurrent major depression-severe (HCC) 01/06/2013  . Generalized anxiety disorder 01/06/2013  . Common migraine with intractable migraine 12/05/2012  . IDDM (insulin dependent diabetes mellitus) ([facility]CC) 12/05/2012  . RLQ abdominal pain 11/30/2011   PCP:  Jordan Hawks, PA-C Pharmacy:   [facility] [address], [place] - [address] [place] [address] Phone: [phone] Fax: [phone]     Social Determinants of Health (SDOH) Interventions    Readmission Risk Interventions No flowsheet data found.

## 2018-09-20 ENCOUNTER — Telehealth: Payer: Self-pay | Admitting: *Deleted

## 2018-09-20 NOTE — Telephone Encounter (Signed)
Call to patient regarding appointment for IUD recheck.  Left message that due to [facility], need cancel recheck appointment. Left message we are available if any issues and to call back if any problems or concerns.  Routing to [employer] for reschedule upon reopen, if patient desires.

## 2018-09-25 ENCOUNTER — Ambulatory Visit: Payer: BC Managed Care – PPO | Admitting: Obstetrics and Gynecology

## 2018-10-28 ENCOUNTER — Telehealth: Payer: Self-pay | Admitting: Neurology

## 2018-10-28 NOTE — Telephone Encounter (Signed)
Pt called saying today is day 4 of a migraine that she has been having and pt is wondering if she can come in today for an IV treatment. Please advise.

## 2018-10-28 NOTE — Telephone Encounter (Signed)
I called patient, left a message, I will call back later. 

## 2018-10-28 NOTE — Telephone Encounter (Signed)
I called the patient.  The patient has had a four-day headache.  We will get her in for Depacon injection.  She missed her revisit in January, she has not scheduled another revisit, we will need to do this.

## 2018-10-29 IMAGING — CR DG ABDOMEN 1V
3 series · 3 of 3 positions shown · non-contrast
Comparison: CT 05/14/2017

CLINICAL DATA: Left side abdominal pain.  History of kidney stones.

EXAM:
ABDOMEN - 1 VIEW

[t abdomen supine (1 of 3)]
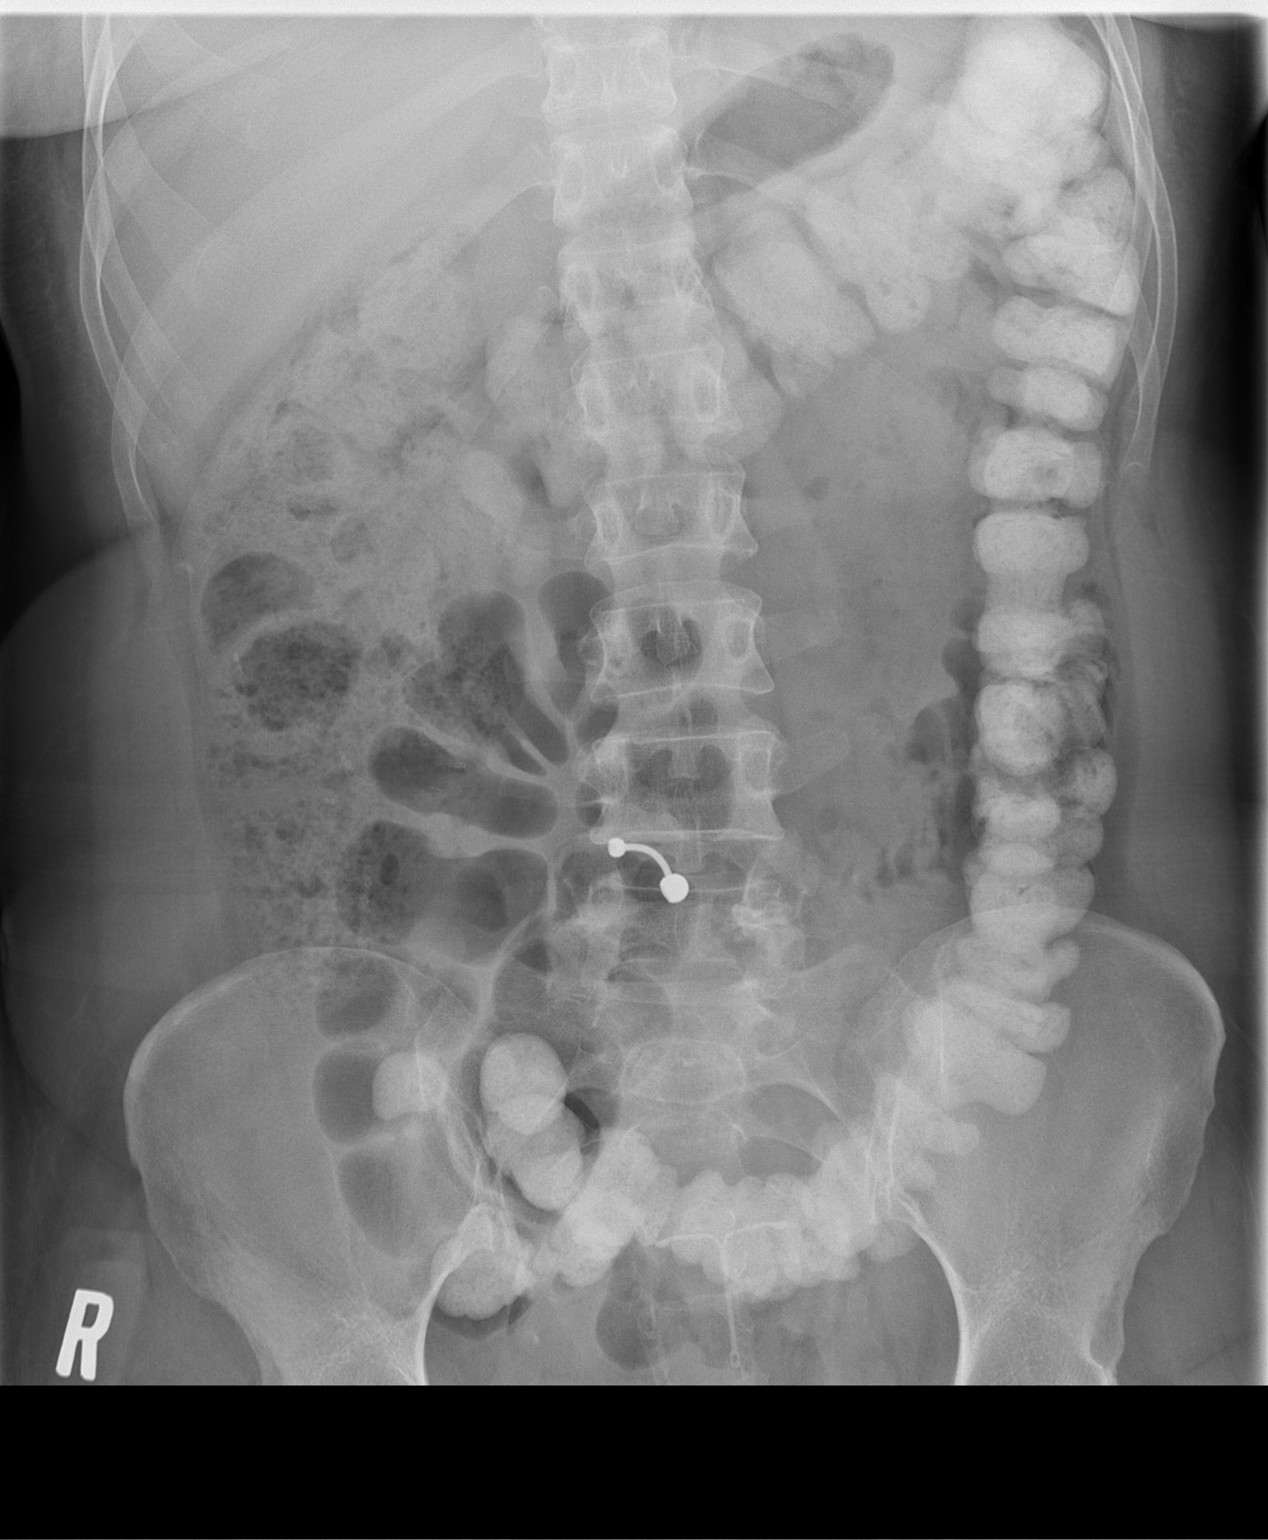

[t abdomen supine (2 of 3)]
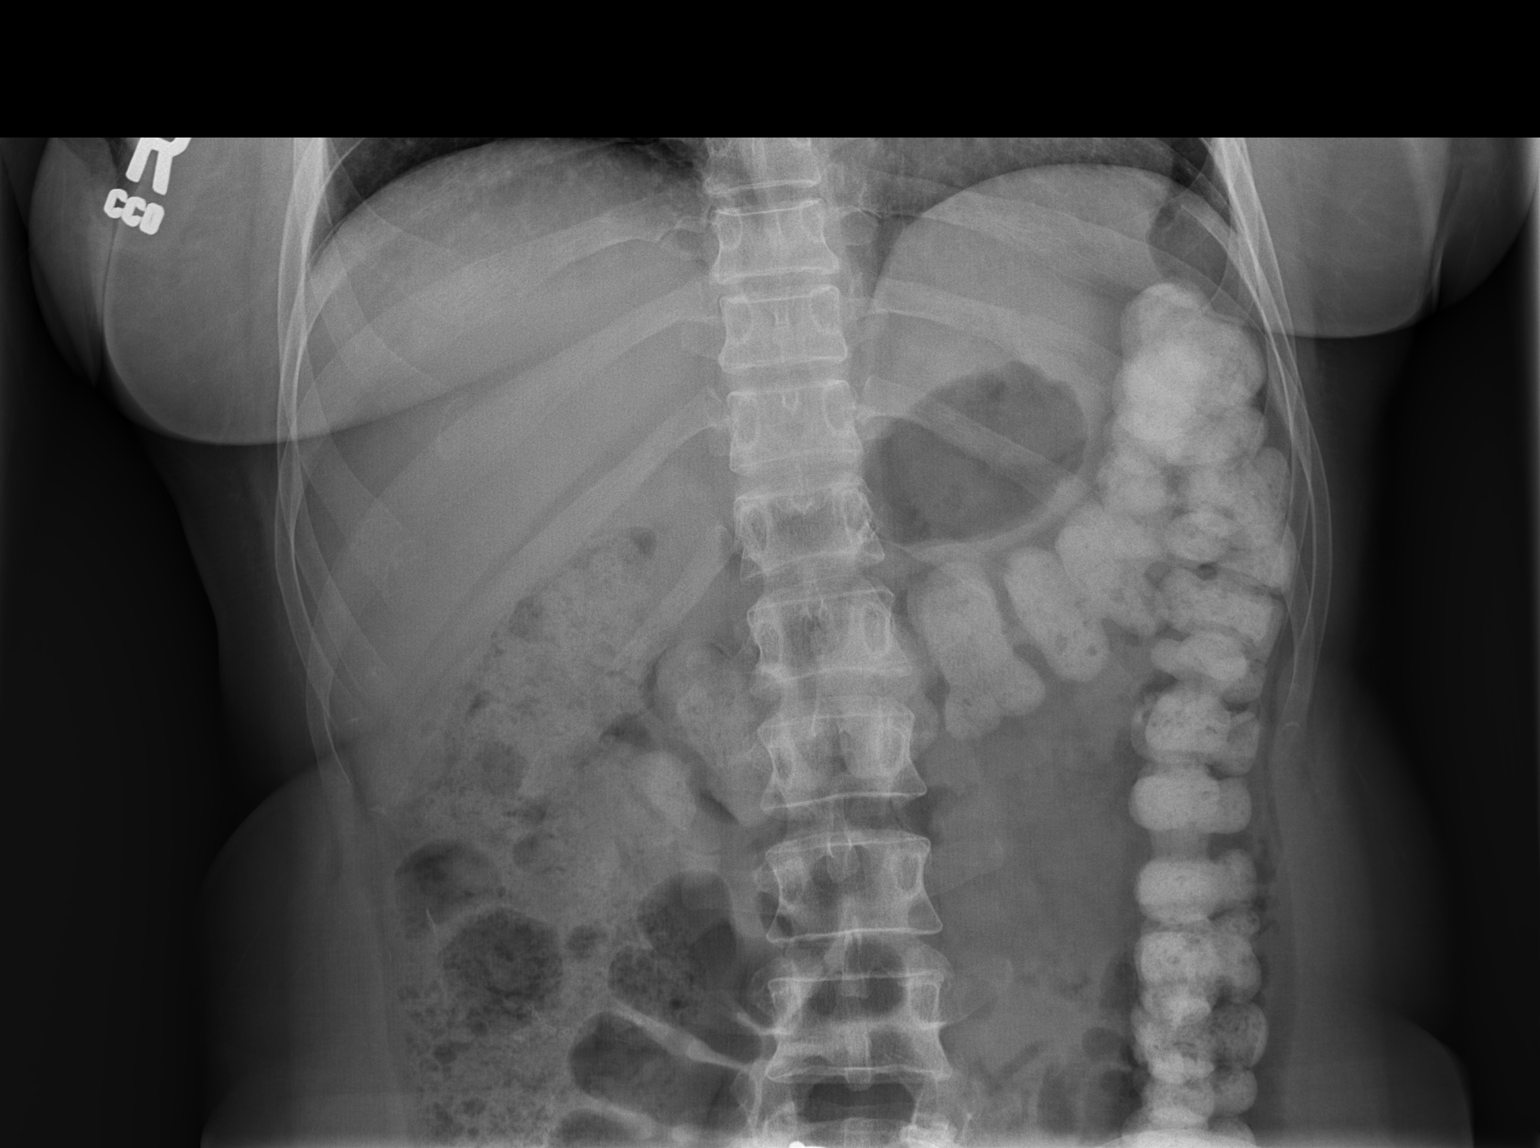

[t abdomen supine (3 of 3)]
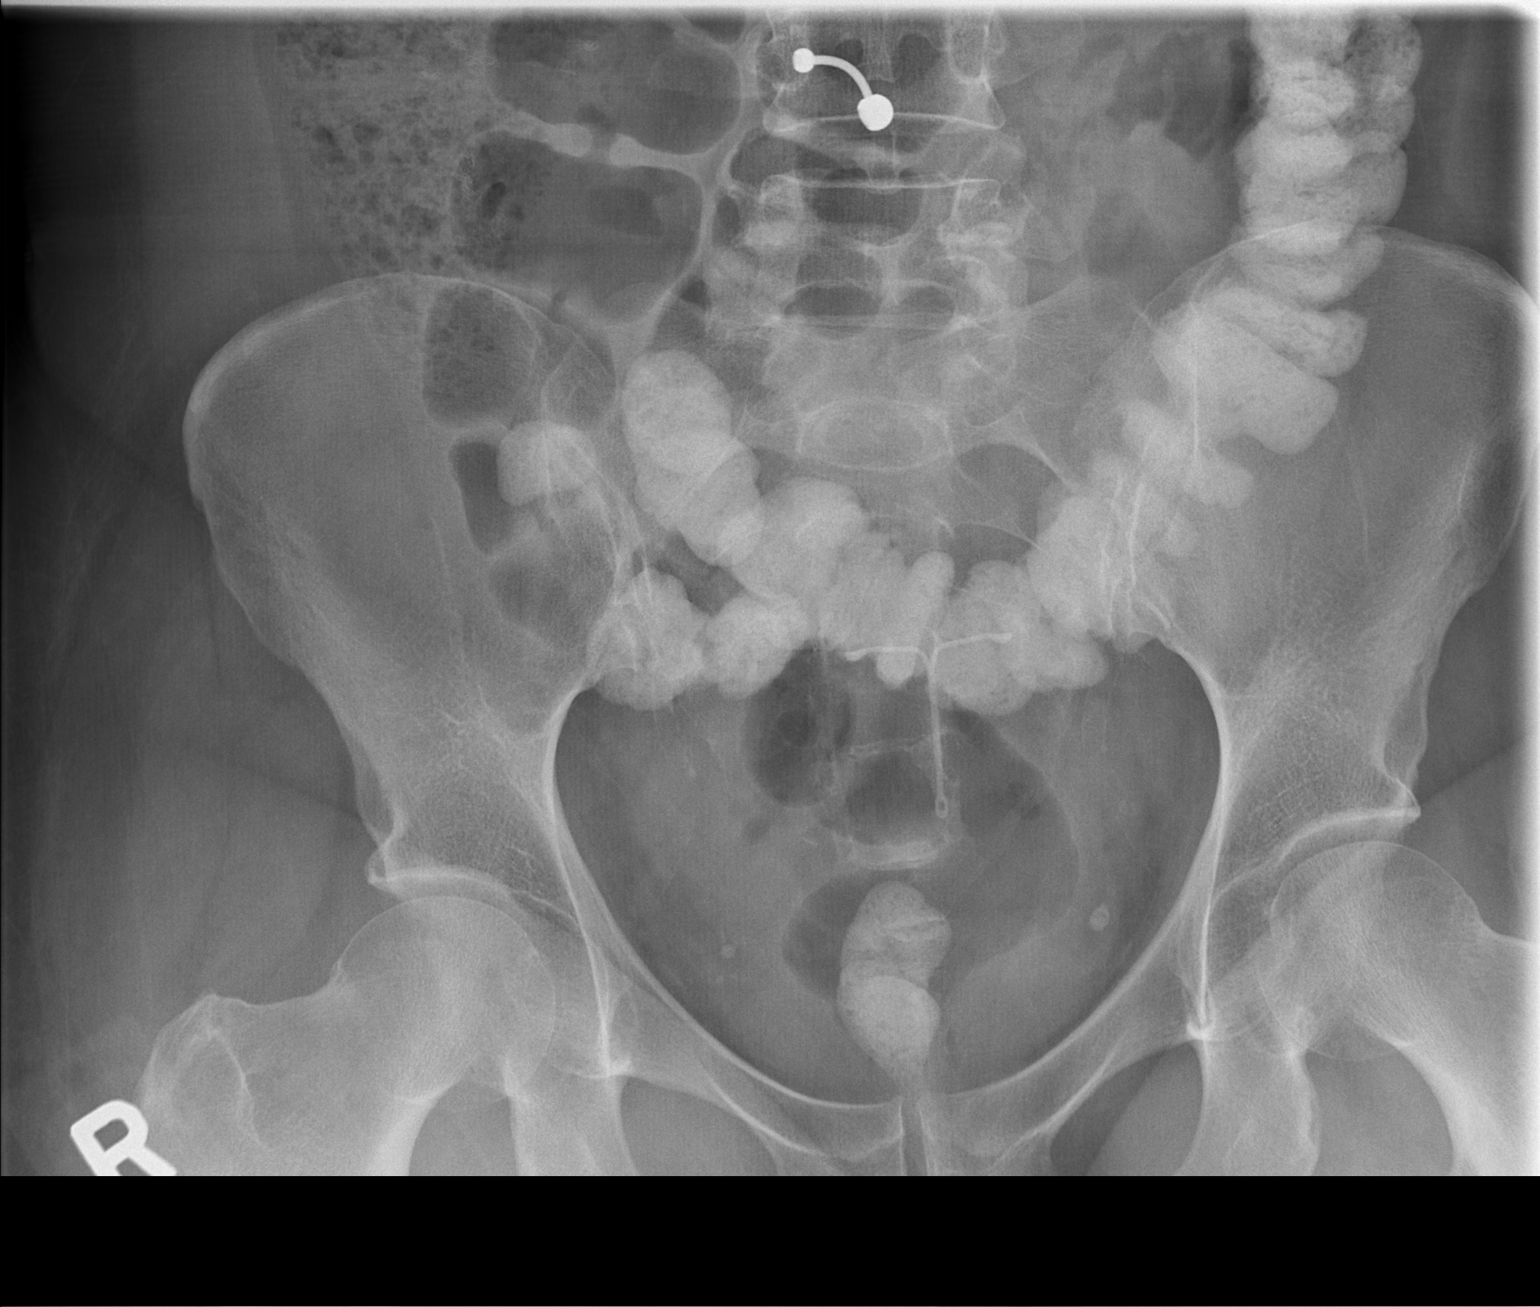

[3 of 3 positions shown; findings below may reference images not displayed]

FINDINGS: Large stool burden throughout the colon. Oral contrast material is
seen within the colon. Rounded calcifications in the pelvis likely
reflect phleboliths. No visible suspicious calcification. No
organomegaly or free air. IUD noted in the pelvis.
IMPRESSION: Large stool burden throughout the colon. No definite visible
suspicious calcification. Small stones could be obscured by stool
and contrast within the colon.

## 2018-10-29 IMAGING — US US RENAL
1 series · 14 of 25 positions shown · non-contrast
Comparison: CT 05/14/2017.

CLINICAL DATA: Left flank pain.

EXAM:
RENAL / URINARY TRACT ULTRASOUND COMPLETE

[Series 1: us renal · 14 of 61 slices shown]
[im 1/61]
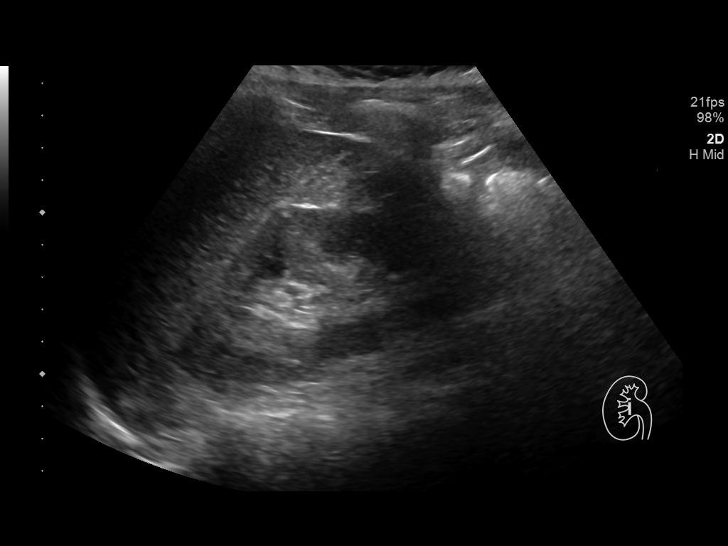
[im 6/61]
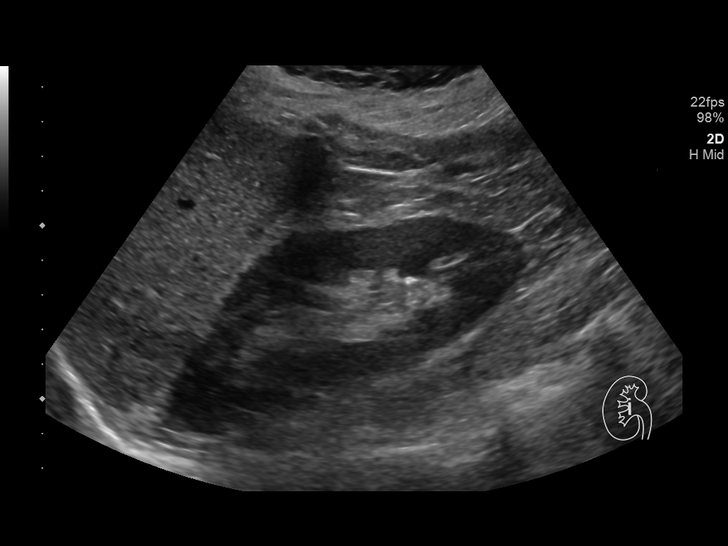
[im 11/61]
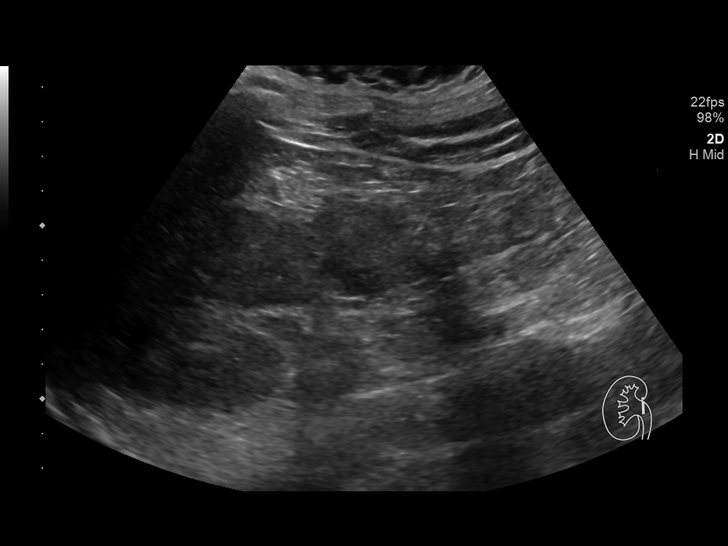
[im 16/61]
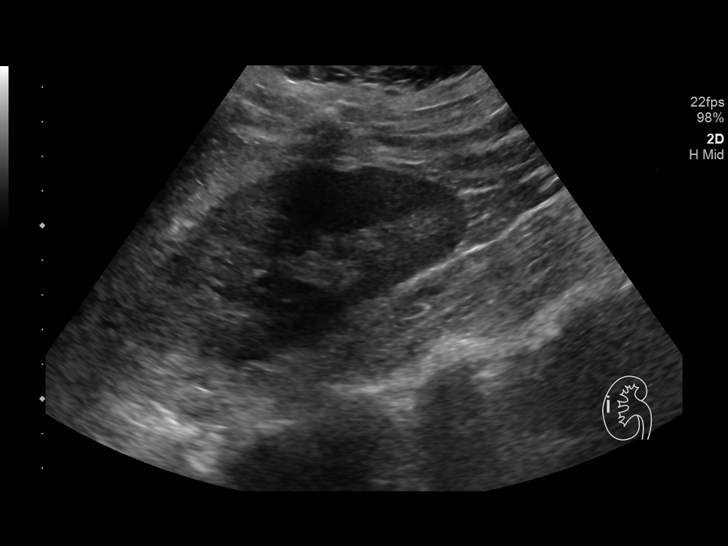
[im 21/61]
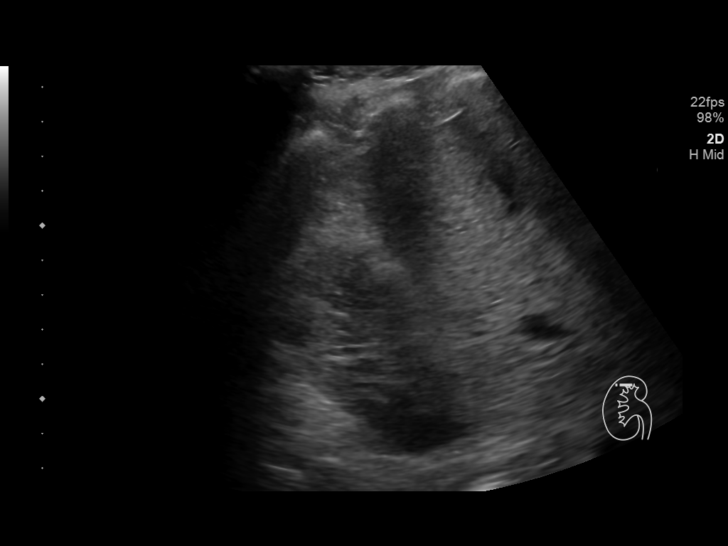
[im 23/61]
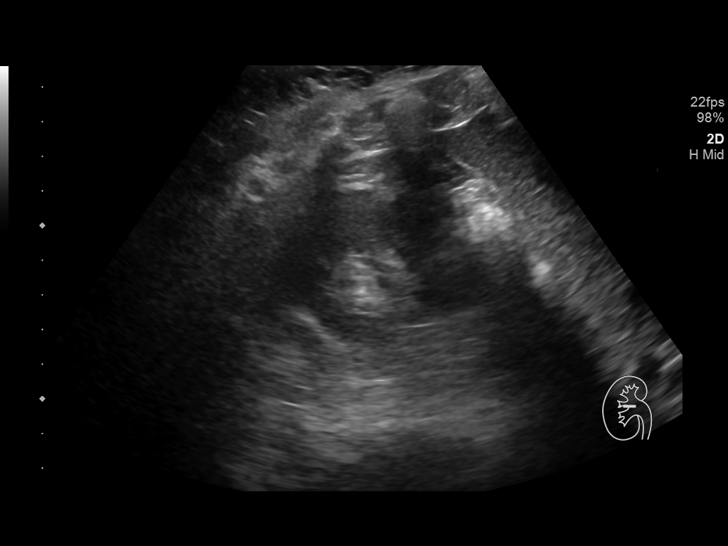
[im 28/61]
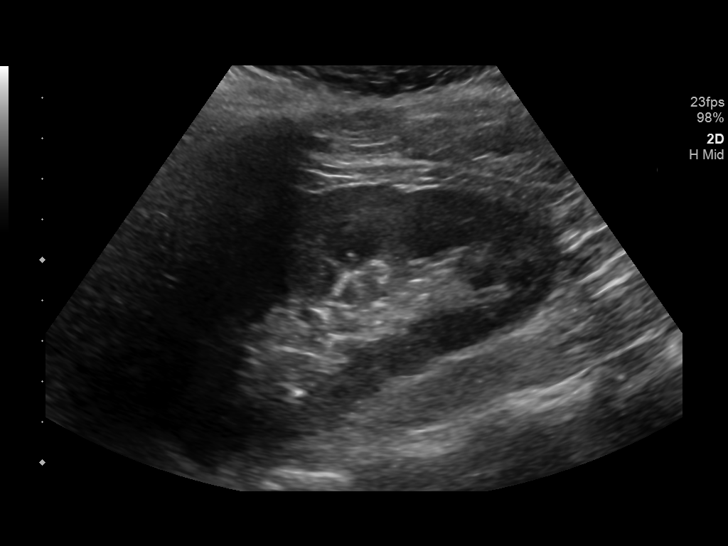
[im 33/61]
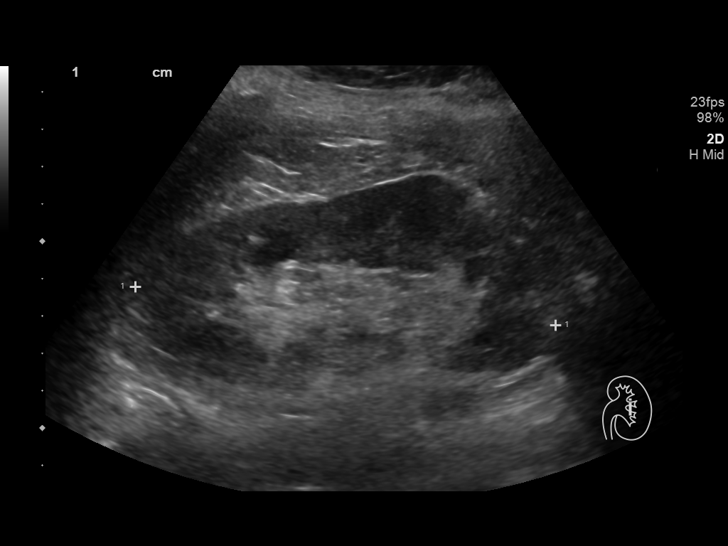
[im 38/61]
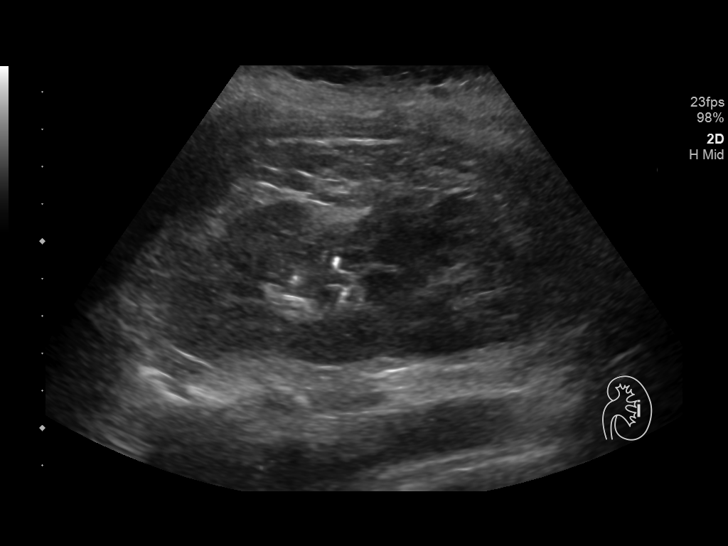
[im 41/61]
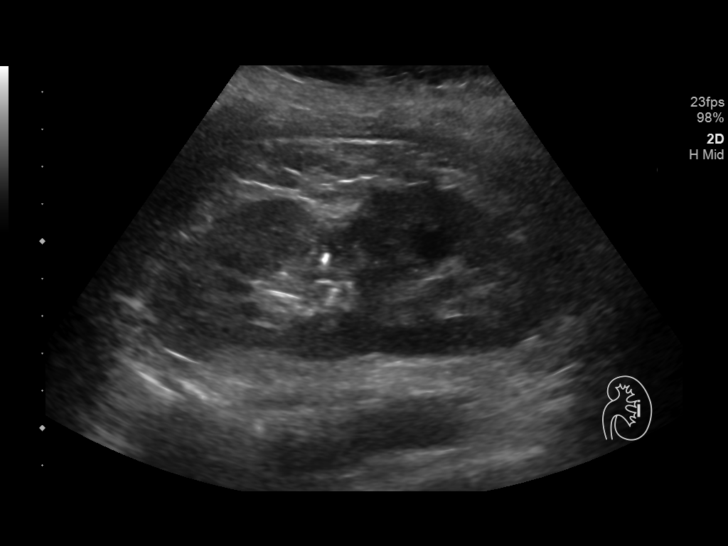
[im 46/61]
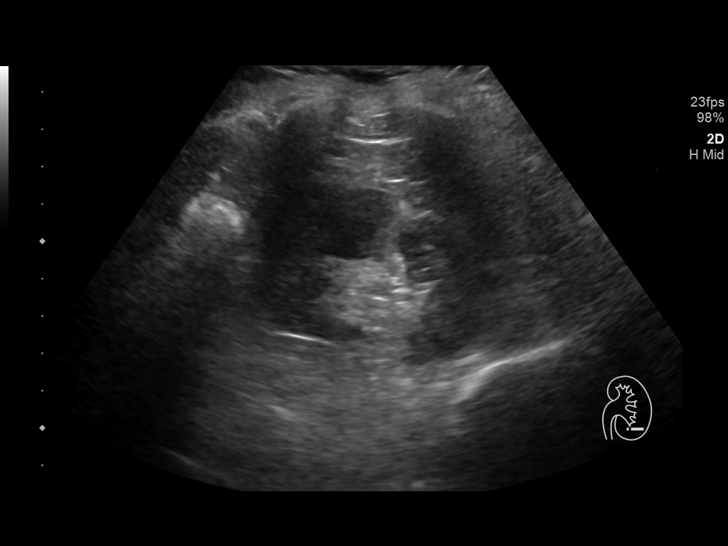
[im 51/61]
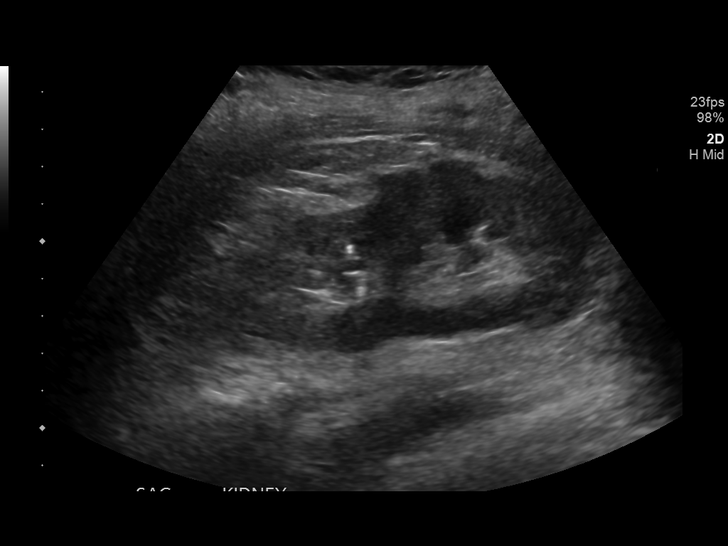
[im 56/61]
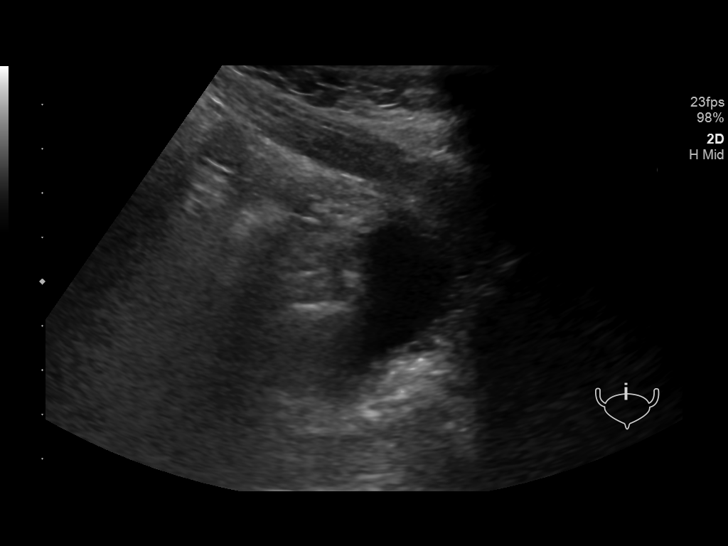
[im 61/61]
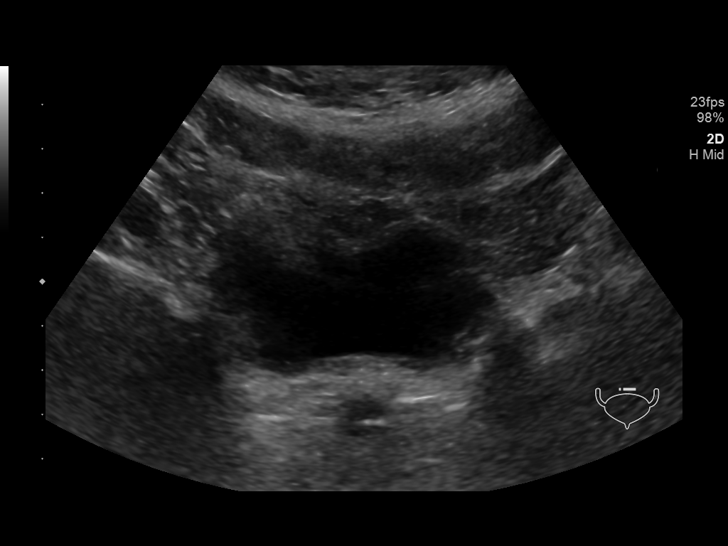

[14 of 25 positions shown; findings below may reference images not displayed]

FINDINGS: Right Kidney:

Length: 11.4 cm. Echogenicity within normal limits. No mass or
hydronephrosis visualized. Tiny echogenic foci noted the renal
pelvis. Nonobstructing renal stones cannot be excluded.

Left Kidney:

Length: 11.3 cm. Left renal cortical irregularity suggesting
scarring echogenicity within normal limits. No mass or
hydronephrosis visualized. Tiny echogenic foci noted the renal
pelvis. Nonobstructing renal stones cannot be excluded.

Bladder:

Appears normal for degree of bladder distention.
IMPRESSION: 1. Tiny echogenic foci noted the renal pelvis bilaterally. Tiny
nonobstructing stones cannot be excluded. Left renal cortical
irregularity suggesting scarring.

2.  No acute abnormality.  No hydronephrosis.

## 2018-10-29 NOTE — Telephone Encounter (Addendum)
LVM to schedule 3-4 month f/u

## 2018-11-25 ENCOUNTER — Encounter (HOSPITAL_COMMUNITY): Payer: Self-pay

## 2018-11-25 ENCOUNTER — Emergency Department (HOSPITAL_COMMUNITY)
Admission: EM | Admit: 2018-11-25 | Discharge: 2018-11-25 | Disposition: A | Discharge status: Home / Self Care | Payer: Self-pay | Attending: Emergency Medicine | Admitting: Emergency Medicine

## 2018-11-25 ENCOUNTER — Emergency Department (HOSPITAL_COMMUNITY): Payer: Self-pay

## 2018-11-25 ENCOUNTER — Other Ambulatory Visit: Payer: Self-pay

## 2018-11-25 DIAGNOSIS — E860 Dehydration: Secondary | ICD-10-CM | POA: Insufficient documentation

## 2018-11-25 DIAGNOSIS — Z79899 Other long term (current) drug therapy: Secondary | ICD-10-CM | POA: Insufficient documentation

## 2018-11-25 DIAGNOSIS — R739 Hyperglycemia, unspecified: Secondary | ICD-10-CM

## 2018-11-25 DIAGNOSIS — Z794 Long term (current) use of insulin: Secondary | ICD-10-CM | POA: Insufficient documentation

## 2018-11-25 DIAGNOSIS — E1065 Type 1 diabetes mellitus with hyperglycemia: Secondary | ICD-10-CM | POA: Insufficient documentation

## 2018-11-25 DIAGNOSIS — G43109 Migraine with aura, not intractable, without status migrainosus: Secondary | ICD-10-CM | POA: Insufficient documentation

## 2018-11-25 LAB — URINALYSIS, ROUTINE W REFLEX MICROSCOPIC
Bilirubin Urine: NEGATIVE
Glucose, UA: >=500 mg/dL — AB
Hgb urine dipstick: NEGATIVE
Ketones, ur: NEGATIVE mg/dL
Leukocytes,Ua: NEGATIVE
Nitrite: NEGATIVE
Protein, ur: NEGATIVE mg/dL
Specific Gravity, Urine: 1.013 (ref 1.005–1.030)
pH: 6 (ref 5.0–8.0)

## 2018-11-25 LAB — CBC WITH DIFFERENTIAL/PLATELET
Abs Immature Granulocytes: 0.03 10*3/uL (ref 0.00–0.07)
Basophils Absolute: 0 10*3/uL (ref 0.0–0.1)
Basophils Relative: 0 %
Eosinophils Absolute: 0.1 10*3/uL (ref 0.0–0.5)
Eosinophils Relative: 1 %
HCT: 40.5 % (ref 36.0–46.0)
Hemoglobin: 13.4 g/dL (ref 12.0–15.0)
Immature Granulocytes: 0 %
Lymphocytes Relative: 23 %
Lymphs Abs: 2 10*3/uL (ref 0.7–4.0)
MCH: 30.1 pg (ref 26.0–34.0)
MCHC: 33.1 g/dL (ref 30.0–36.0)
MCV: 91 fL (ref 80.0–100.0)
Monocytes Absolute: 0.4 10*3/uL (ref 0.1–1.0)
Monocytes Relative: 5 %
Neutro Abs: 6 10*3/uL (ref 1.7–7.7)
Neutrophils Relative %: 71 %
Platelets: 373 10*3/uL (ref 150–400)
RBC: 4.45 MIL/uL (ref 3.87–5.11)
RDW: 12.4 % (ref 11.5–15.5)
WBC: 8.6 10*3/uL (ref 4.0–10.5)
nRBC: 0 % (ref 0.0–0.2)

## 2018-11-25 LAB — COMPREHENSIVE METABOLIC PANEL
ALT: 15 U/L (ref 0–44)
AST: 13 U/L — ABNORMAL LOW (ref 15–41)
Albumin: 4.2 g/dL (ref 3.5–5.0)
Alkaline Phosphatase: 52 U/L (ref 38–126)
Anion gap: 9 (ref 5–15)
BUN: 10 mg/dL (ref 6–20)
CO2: 24 mmol/L (ref 22–32)
Calcium: 9.5 mg/dL (ref 8.9–10.3)
Chloride: 102 mmol/L (ref 98–111)
Creatinine, Ser: 0.75 mg/dL (ref 0.44–1.00)
GFR calc Af Amer: >60 mL/min (ref >60)
GFR calc non Af Amer: >60 mL/min (ref >60)
Glucose, Bld: 364 mg/dL — ABNORMAL HIGH (ref 70–99)
Potassium: 4.1 mmol/L (ref 3.5–5.1)
Sodium: 135 mmol/L (ref 135–145)
Total Bilirubin: 0.6 mg/dL (ref 0.3–1.2)
Total Protein: 7.1 g/dL (ref 6.5–8.1)

## 2018-11-25 LAB — I-STAT BETA HCG BLOOD, ED (MC, WL, AP ONLY): I-stat hCG, quantitative: <5 m[IU]/mL (ref <5)

## 2018-11-25 LAB — CBG MONITORING, ED
Glucose-Capillary: 333 mg/dL — ABNORMAL HIGH (ref 70–99)
Glucose-Capillary: 176 mg/dL — ABNORMAL HIGH (ref 70–99)

## 2018-11-25 MED ORDER — INSULIN ASPART 100 UNIT/ML ~~LOC~~ SOLN
5.0000 [IU] | Freq: Once | SUBCUTANEOUS | Status: AC
  Administered 2018-11-25: 18:00:00 5 [IU] via INTRAVENOUS
  Filled 2018-11-25: qty 1

## 2018-11-25 MED ORDER — PROCHLORPERAZINE EDISYLATE 10 MG/2ML IJ SOLN
10.0000 mg | Freq: Once | INTRAMUSCULAR | Status: AC
  Administered 2018-11-25: 10 mg via INTRAVENOUS
  Filled 2018-11-25: qty 2

## 2018-11-25 MED ORDER — MORPHINE SULFATE (PF) 4 MG/ML IV SOLN
4.0000 mg | Freq: Once | INTRAVENOUS | Status: AC
  Administered 2018-11-25: 4 mg via INTRAVENOUS
  Filled 2018-11-25: qty 1

## 2018-11-25 MED ORDER — SODIUM CHLORIDE 0.9 % IV BOLUS
1000.0000 mL | Freq: Once | INTRAVENOUS | Status: AC
  Administered 2018-11-25: 1000 mL via INTRAVENOUS

## 2018-11-25 NOTE — ED Provider Notes (Addendum)
[place] DEPT Provider Note   CSN: [id number] Arrival date & time: 11/25/18  1417    History   Chief Complaint Chief Complaint  Patient presents with  . Headache    HPI [name] is a 43 y.o. female.     HPI 43 year old female who presents to the emergency department with 48 hours of intermittent slurred speech and difficulty ambulating.  She reports tingling in her bilateral arms and bilateral legs.  She has a history of migraine headaches and has headache at this time but reports this headache feels different.  She is a type I diabetic since the age of 26.  She reports her blood sugars have been slightly elevated but this does not surprise her as she just finished a course of antibiotics for acute bronchitis.  She reports productive cough over the past approximately 10 days.  She was checked for COVID-19 at the [facility] facility and reports a negative test.  She has finished her antibiotics.  She denies shortness of breath at this time.  She reports last night her slurred speech was significant but mild this a.m.  She called her primary care doctor who recommended she come to the ER for evaluation.   Past Medical History:  Diagnosis Date  . Adopted   . Cluster headache syndrome, intractable   . Depression   . Migraine without aura, with intractable migraine, so stated, without mention of status migrainosus 12/05/2012  . Ovarian cyst   . STD (sexually transmitted disease)    HSV II   . Type I diabetes mellitus [facility])     Patient Active Problem List   Diagnosis Date Noted  . Dehydration 09/17/2018  . Gastroesophageal reflux disease   . ADHD   . Volume depletion 07/21/2018  . Diabetic ketoacidosis ([place]) 07/21/2018  . Morbid obesity ([place]) 06/16/2018  . Chronic tension-type headache, intractable 06/03/2018  . Multiple thyroid nodules 04/17/2018  . 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 ([place]) 01/01/2018  . DKA, type 1 ([place])  02/14/2015  . DKA (diabetic ketoacidoses) ([place]) 02/14/2015  . Thyroid nodule 02/14/2015  . Depression with anxiety 02/14/2015  . Acute kidney injury ([place]) 02/14/2015  . Leukocytosis 02/14/2015  . Increased anion gap metabolic acidosis 84/69/6295  . History of frequent urinary tract infections 01/25/2014  . Anxiety 12/04/2013  . Neuropathic pain 12/04/2013  . Recurrent UTI 12/04/2013  . Aphakia of left eye 06/23/2013  . Retinal detachment 06/23/2013  . PDR (proliferative diabetic retinopathy) ([place]) 06/23/2013  . Recurrent major depression-severe ([place]) 01/06/2013  . Generalized anxiety disorder 01/06/2013  . Common migraine with intractable migraine 12/05/2012  . IDDM (insulin dependent diabetes mellitus) ([place]) 12/05/2012  . RLQ abdominal pain 11/30/2011    Past Surgical History:  Procedure Laterality Date  . APPENDECTOMY    . BUNIONECTOMY Bilateral   . CHOLECYSTECTOMY    . EYE SURGERY    . OVARIAN CYST REMOVAL       OB History    Gravida  0   Para  0   Term  0   Preterm  0   AB  0   Living  0     SAB  0   TAB  0   Ectopic  0   Multiple  0   Live Births  0            Home Medications    Prior to Admission medications   Medication Sig Start Date End Date Taking? Authorizing Provider  amphetamine-dextroamphetamine (ADDERALL) 20 MG tablet Take 20 mg by mouth 3 (three) times daily.  10/24/17   [provider]  benzonatate (TESSALON) 100 MG capsule Take 100 mg by mouth 3 (three) times daily as needed for cough.  08/26/18   [provider]  blood glucose meter kit and supplies KIT Dispense based on patient and insurance preference. Use up to four times daily as directed. (FOR ICD-9 250.00, 250.01). 07/23/18   Kayleen Memos, DO  clonazePAM (KLONOPIN) 0.5 MG tablet Take 0.5 mg by mouth 3 (three) times daily as needed for anxiety. For anxiety 01/09/13   Niel Hummer, NP  dicyclomine (BENTYL) 20 MG tablet Take 1 tablet (20 mg total) by mouth 2  (two) times daily. 04/01/18   Drenda Freeze, MD  DULoxetine (CYMBALTA) 30 MG capsule Take 30 mg by mouth daily.  09/09/18   [provider]  Fremanezumab-vfrm (AJOVY) 225 MG/1.5ML SOSY Inject 1 pen into the skin every 30 (thirty) days.    [provider]  gabapentin (NEURONTIN) 300 MG capsule Take 300 mg by mouth 3 (three) times daily.  08/27/18   [provider]  HYDROmorphone (DILAUDID) 2 MG tablet Take 1 tablet (2 mg total) by mouth every 4 (four) hours as needed. 08/25/18   Salvadore Dom, MD  ibuprofen (ADVIL,MOTRIN) 800 MG tablet Take 1 tablet (800 mg total) by mouth every 8 (eight) hours as needed. Patient taking differently: Take 800 mg by mouth every 8 (eight) hours as needed for moderate pain.  08/25/18   Salvadore Dom, MD  insulin aspart (NOVOLOG) 100 UNIT/ML injection Inject 0-9 Units into the skin 3 (three) times daily with meals. 09/19/18   Eugenie Filler, MD  Insulin Aspart, w/Niacinamide, (FIASP FLEXTOUCH) 100 UNIT/ML SOPN Inject 1-8 Units into the skin 3 (three) times daily. 09/19/18   Eugenie Filler, MD  Insulin Glargine (BASAGLAR KWIKPEN) 100 UNIT/ML SOPN Inject 0.22 mLs (22 Units total) into the skin daily. 09/19/18   Eugenie Filler, MD  Insulin Pen Needle 31G X 5 MM MISC 1-8 Units by Does not apply route 3 (three) times daily. 09/19/18   Eugenie Filler, MD  levonorgestrel (MIRENA) 20 MCG/24HR IUD 1 each by Intrauterine route once. Placed 06/2014    [provider]  misoprostol (CYTOTEC) 200 MCG tablet Place 2 tablets vaginally 6-12 hours prior to IUD insertion 08/25/18   Salvadore Dom, MD  ondansetron (ZOFRAN-ODT) 4 MG disintegrating tablet Take 4 mg by mouth every 8 (eight) hours as needed for nausea or vomiting.  09/10/18   [provider]  pantoprazole (PROTONIX) 40 MG tablet Take 1 tablet (40 mg total) by mouth daily. 09/19/18   Eugenie Filler, MD  promethazine (PHENERGAN) 25 MG tablet Take 1 tablet  (25 mg total) by mouth every 6 (six) hours as needed for nausea or vomiting. 08/27/18   Kathrynn Ducking, MD  REXULTI 2 MG TABS Take 2 mg by mouth daily. 08/02/18   [provider]  SPRAVATO, 84 MG DOSE, 28 MG/DEVICE SOPK Take 84 mg by mouth 2 (two) times a week.  09/17/18   [provider]  traZODone (DESYREL) 150 MG tablet Take 150 mg by mouth at bedtime as needed for sleep.  08/29/18   [provider]  zolpidem (AMBIEN) 10 MG tablet Take 10 mg by mouth at bedtime.     [provider]    Family History Family History  Adopted: Yes  Problem Relation Age  of Onset  . Diabetes Father     Social History Social History   Tobacco Use  . Smoking status: Never Smoker  . Smokeless tobacco: Never Used  Substance Use Topics  . Alcohol use: Yes    Comment: seldom  . Drug use: No     Allergies   Sulfa antibiotics; Wellbutrin [bupropion]; Amoxicillin; and Codeine   Review of Systems Review of Systems  All other systems reviewed and are negative.    Physical Exam Updated Vital Signs BP 128/84   Pulse (!) 126   Temp 100.3 F (37.9 C) (Oral)   Resp 16   Ht 5' 1"  (1.549 m)   Wt 72.6 kg   SpO2 99%   BMI 30.23 kg/m   Physical Exam Vitals signs and nursing note reviewed.  Constitutional:      General: She is not in acute distress.    Appearance: She is well-developed.  HENT:     Head: Normocephalic and atraumatic.  Neck:     Musculoskeletal: Normal range of motion.  Cardiovascular:     Rate and Rhythm: Normal rate and regular rhythm.     Heart sounds: Normal heart sounds.  Pulmonary:     Effort: Pulmonary effort is normal.     Breath sounds: Normal breath sounds.  Abdominal:     General: There is no distension.     Palpations: Abdomen is soft.     Tenderness: There is no abdominal tenderness.  Musculoskeletal: Normal range of motion.  Skin:    General: Skin is warm and dry.  Neurological:     Mental Status: She is alert and oriented  to person, place, and time.     Comments: No facial droop.  Speech is normal.  No dysarthria or aphasia noted.  Bilateral arm and leg weakness.  Difficult to assess whether or not this was an effort related issue versus true generalized weakness.  Her upper and lower extremities bilaterally are equal however  Psychiatric:        Judgment: Judgment normal.      ED Treatments / Results  Labs (all labs ordered are listed, but only abnormal results are displayed) Labs Reviewed  CBG MONITORING, ED - Abnormal; Notable for the following components:      Result Value   Glucose-Capillary 333 (*)    All other components within normal limits  CBC WITH DIFFERENTIAL/PLATELET  COMPREHENSIVE METABOLIC PANEL  URINALYSIS, ROUTINE W REFLEX MICROSCOPIC  I-STAT BETA HCG BLOOD, ED ([facility], [facility], [facility] ONLY)    EKG EKG Interpretation  Date/Time:  Tuesday Nov 25 2018 14:27:35 EDT Ventricular Rate:  127 PR Interval:    QRS Duration: 67 QT Interval:  301 QTC Calculation: 438 R Axis:   67 Text Interpretation:  Sinus tachycardia Borderline T abnormalities, inferior leads No significant change was found Confirmed by Jola Schmidt [phone]) on 11/25/2018 4:39:48 PM   Radiology No results found.  Procedures Procedures (including critical care time)  Medications Ordered in ED Medications  prochlorperazine (COMPAZINE) injection 10 mg (10 mg Intravenous Given 11/25/18 1626)  morphine 4 MG/ML injection 4 mg (4 mg Intravenous Given 11/25/18 1626)  sodium chloride 0.9 % bolus 1,000 mL (1,000 mLs Intravenous New Bag/Given (Non-Interop) 11/25/18 1625)     Initial Impression / Assessment and Plan / ED Course  I have reviewed the triage vital signs and the nursing notes.  Pertinent labs & imaging results that were available during my care of the patient were reviewed by me and considered in  my medical decision making (see chart for details).        Type I diabetic.  Labs pending.  Tachycardia noted.  This is  sinus tachycardia.  IV fluids will be given.  Could represent electrolyte abnormality.  Given intermittent speech issues patient will undergo stat MRI at this time to evaluate for stroke.  No neck pain.  No recent injury or trauma.  IV fluids, labs, urine, MRI.  Care transferred to Dr. Lita Mains at 4:39 PM  Final Clinical Impressions(s) / ED Diagnoses   Final diagnoses:  None    ED Discharge Orders    None       Jola Schmidt, MD 11/25/18 1639    Jola Schmidt, MD 11/25/18 1640

## 2018-11-25 NOTE — ED Notes (Signed)
Pt transported to MRI by DJ, NT.

## 2018-11-25 NOTE — ED Provider Notes (Signed)
Signed out from previous provider pending MRI.  MRI without acute findings.  Patient with elevated blood sugar.  Given insulin IV fluids.  Headache is improved.  Normal neurologic exam.  Suspect possible complicated migraine.  Advised to stay hydrated and follow-up with neurology.  Return precautions given.   Loren Racer, MD 11/25/18 2120

## 2018-11-25 NOTE — ED Triage Notes (Addendum)
Patient reports that she woke 2 nights ago and had slurred speech and the slurred speech comes and goes, feeling like "bees stinging her all over" and headaches. Patient states she has a history of migraines,but does not feel like her typical migraine. Patient states when she had the slurred speech, her significant other could not understand what she was saying. Patient states the slurred speech was "really bad" last night,but mild this AM.  Patient states she tested "negative for covid -19 2 weeks ago.  Patient does nt have slurred speech during triage.

## 2018-12-18 ENCOUNTER — Emergency Department (HOSPITAL_COMMUNITY): Payer: Self-pay

## 2018-12-18 ENCOUNTER — Other Ambulatory Visit: Payer: Self-pay

## 2018-12-18 ENCOUNTER — Emergency Department (HOSPITAL_COMMUNITY)
Admission: EM | Admit: 2018-12-18 | Discharge: 2018-12-18 | Disposition: A | Discharge status: Home / Self Care | Payer: Self-pay | Attending: Emergency Medicine | Admitting: Emergency Medicine

## 2018-12-18 ENCOUNTER — Encounter (HOSPITAL_COMMUNITY): Payer: Self-pay

## 2018-12-18 DIAGNOSIS — E1065 Type 1 diabetes mellitus with hyperglycemia: Secondary | ICD-10-CM | POA: Insufficient documentation

## 2018-12-18 DIAGNOSIS — F909 Attention-deficit hyperactivity disorder, unspecified type: Secondary | ICD-10-CM | POA: Insufficient documentation

## 2018-12-18 DIAGNOSIS — N390 Urinary tract infection, site not specified: Secondary | ICD-10-CM

## 2018-12-18 LAB — CBG MONITORING, ED: Glucose-Capillary: 165 mg/dL — ABNORMAL HIGH (ref 70–99)

## 2018-12-18 LAB — BASIC METABOLIC PANEL
Anion gap: 9 (ref 5–15)
BUN: 12 mg/dL (ref 6–20)
CO2: 21 mmol/L — ABNORMAL LOW (ref 22–32)
Calcium: 8.9 mg/dL (ref 8.9–10.3)
Chloride: 107 mmol/L (ref 98–111)
Creatinine, Ser: 0.75 mg/dL (ref 0.44–1.00)
GFR calc Af Amer: >60 mL/min (ref >60)
GFR calc non Af Amer: >60 mL/min (ref >60)
Glucose, Bld: 177 mg/dL — ABNORMAL HIGH (ref 70–99)
Potassium: 4.4 mmol/L (ref 3.5–5.1)
Sodium: 137 mmol/L (ref 135–145)

## 2018-12-18 LAB — I-STAT BETA HCG BLOOD, ED (MC, WL, AP ONLY): I-stat hCG, quantitative: <5 m[IU]/mL (ref <5)

## 2018-12-18 LAB — URINALYSIS, ROUTINE W REFLEX MICROSCOPIC
RBC / HPF: >50 RBC/hpf — ABNORMAL HIGH (ref 0–5)
WBC, UA: >50 WBC/hpf — ABNORMAL HIGH (ref 0–5)

## 2018-12-18 LAB — CBC
HCT: 40.8 % (ref 36.0–46.0)
Hemoglobin: 13.1 g/dL (ref 12.0–15.0)
MCH: 29.8 pg (ref 26.0–34.0)
MCHC: 32.1 g/dL (ref 30.0–36.0)
MCV: 92.7 fL (ref 80.0–100.0)
Platelets: 368 10*3/uL (ref 150–400)
RBC: 4.4 MIL/uL (ref 3.87–5.11)
RDW: 12.4 % (ref 11.5–15.5)
WBC: 13.6 10*3/uL — ABNORMAL HIGH (ref 4.0–10.5)
nRBC: 0 % (ref 0.0–0.2)

## 2018-12-18 MED ORDER — NITROFURANTOIN MONOHYD MACRO 100 MG PO CAPS: 100.0000 mg | ORAL_CAPSULE | Freq: Two times a day (BID) | ORAL | 0 refills | Status: DC

## 2018-12-18 MED ORDER — ONDANSETRON HCL 4 MG PO TABS: 4.0000 mg | ORAL_TABLET | Freq: Four times a day (QID) | ORAL | 0 refills | Status: DC | PRN

## 2018-12-18 MED ORDER — DIPHENHYDRAMINE HCL 50 MG/ML IJ SOLN
25.0000 mg | Freq: Once | INTRAMUSCULAR | Status: AC
  Administered 2018-12-18: 25 mg via INTRAVENOUS
  Filled 2018-12-18: qty 1

## 2018-12-18 MED ORDER — FENTANYL CITRATE (PF) 100 MCG/2ML IJ SOLN
50.0000 ug | Freq: Once | INTRAMUSCULAR | Status: AC
  Administered 2018-12-18: 50 ug via INTRAVENOUS
  Filled 2018-12-18: qty 2

## 2018-12-18 MED ORDER — CIPROFLOXACIN HCL 500 MG PO TABS: 500.0000 mg | ORAL_TABLET | Freq: Two times a day (BID) | ORAL | 0 refills | Status: DC

## 2018-12-18 MED ORDER — SODIUM CHLORIDE 0.9 % IV BOLUS
1000.0000 mL | Freq: Once | INTRAVENOUS | Status: AC
  Administered 2018-12-18: 1000 mL via INTRAVENOUS

## 2018-12-18 MED ORDER — ONDANSETRON HCL 4 MG/2ML IJ SOLN
4.0000 mg | Freq: Once | INTRAMUSCULAR | Status: AC
  Administered 2018-12-18: 4 mg via INTRAVENOUS
  Filled 2018-12-18: qty 2

## 2018-12-18 MED ORDER — SODIUM CHLORIDE 0.9 % IV SOLN
1.0000 g | Freq: Once | INTRAVENOUS | Status: AC
  Administered 2018-12-18: 1 g via INTRAVENOUS
  Filled 2018-12-18: qty 10

## 2018-12-18 MED ORDER — TRAMADOL HCL 50 MG PO TABS: 50.0000 mg | ORAL_TABLET | Freq: Four times a day (QID) | ORAL | 0 refills | Status: DC | PRN

## 2018-12-18 NOTE — ED Provider Notes (Signed)
[place] DEPT Provider Note   CSN: [id number] Arrival date & time: 12/18/18  1035     History   Chief Complaint Chief Complaint  Patient presents with  . Hematuria  . Hyperglycemia    HPI [name] is a 43 y.o. female with a past medical history of insulin-dependent diabetes, recurrent urinary tract infection, kidney stone who presents the emergency department with chief complaint of dysuria.  This is been ongoing for several days, worsening.  She took Azo-Standard yesterday.  Planes of bilateral flank pain, worse on the left.  She has had associated nausea and vomiting today.  She is unable to find a comfortable position.  She has suprapubic pain.  She has a history of previous kidney stones.  She rates her current pain at 8 out of 10.  It does not seem to be colicky however she does feel that the intensity of pain is similar to previous kidney stone.  She denies fevers or chills.  Her blood sugar was above 400 when she awoke this morning which she corrected with insulin.     HPI  Past Medical History:  Diagnosis Date  . Adopted   . Cluster headache syndrome, intractable   . Depression   . Migraine without aura, with intractable migraine, so stated, without mention of status migrainosus 12/05/2012  . Ovarian cyst   . STD (sexually transmitted disease)    HSV II   . Type I diabetes mellitus [facility])     Patient Active Problem List   Diagnosis Date Noted  . Dehydration 09/17/2018  . Gastroesophageal reflux disease   . ADHD   . Volume depletion 07/21/2018  . Diabetic ketoacidosis ([place]) 07/21/2018  . Morbid obesity ([place]) 06/16/2018  . Chronic tension-type headache, intractable 06/03/2018  . Multiple thyroid nodules 04/17/2018  . 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 ([place]) 01/01/2018  . DKA, type 1 ([place]) 02/14/2015  . DKA (diabetic ketoacidoses) ([place]) 02/14/2015  . Thyroid nodule 02/14/2015  . Depression with anxiety 02/14/2015   . Acute kidney injury ([place]) 02/14/2015  . Leukocytosis 02/14/2015  . Increased anion gap metabolic acidosis 84/66/5993  . History of frequent urinary tract infections 01/25/2014  . Anxiety 12/04/2013  . Neuropathic pain 12/04/2013  . Recurrent UTI 12/04/2013  . Aphakia of left eye 06/23/2013  . Retinal detachment 06/23/2013  . PDR (proliferative diabetic retinopathy) ([place]) 06/23/2013  . Recurrent major depression-severe ([place]) 01/06/2013  . Generalized anxiety disorder 01/06/2013  . Common migraine with intractable migraine 12/05/2012  . IDDM (insulin dependent diabetes mellitus) ([place]) 12/05/2012  . RLQ abdominal pain 11/30/2011    Past Surgical History:  Procedure Laterality Date  . APPENDECTOMY    . BUNIONECTOMY Bilateral   . CHOLECYSTECTOMY    . EYE SURGERY    . OVARIAN CYST REMOVAL       OB History    Gravida  0   Para  0   Term  0   Preterm  0   AB  0   Living  0     SAB  0   TAB  0   Ectopic  0   Multiple  0   Live Births  0            Home Medications    Prior to Admission medications   Medication Sig Start Date End Date Taking? Authorizing Provider  amphetamine-dextroamphetamine (ADDERALL) 20 MG tablet Take 20 mg by mouth 3 (three) times daily.  10/24/17  [provider]  benzonatate (TESSALON) 100 MG capsule Take 100 mg by mouth 3 (three) times daily as needed for cough.  08/26/18   [provider]  blood glucose meter kit and supplies KIT Dispense based on patient and insurance preference. Use up to four times daily as directed. (FOR ICD-9 250.00, 250.01). 07/23/18   Kayleen Memos, DO  clonazePAM (KLONOPIN) 0.5 MG tablet Take 0.5 mg by mouth 3 (three) times daily as needed for anxiety. For anxiety 01/09/13   Niel Hummer, NP  dicyclomine (BENTYL) 20 MG tablet Take 1 tablet (20 mg total) by mouth 2 (two) times daily. 04/01/18   Drenda Freeze, MD  DULoxetine (CYMBALTA) 30 MG capsule Take 30 mg by mouth daily.  09/09/18    [provider]  Fremanezumab-vfrm (AJOVY) 225 MG/1.5ML SOSY Inject 1 pen into the skin every 30 (thirty) days.    [provider]  gabapentin (NEURONTIN) 300 MG capsule Take 300 mg by mouth 3 (three) times daily.  08/27/18   [provider]  HYDROmorphone (DILAUDID) 2 MG tablet Take 1 tablet (2 mg total) by mouth every 4 (four) hours as needed. 08/25/18   Salvadore Dom, MD  ibuprofen (ADVIL,MOTRIN) 800 MG tablet Take 1 tablet (800 mg total) by mouth every 8 (eight) hours as needed. Patient taking differently: Take 800 mg by mouth every 8 (eight) hours as needed for moderate pain.  08/25/18   Salvadore Dom, MD  insulin aspart (NOVOLOG) 100 UNIT/ML injection Inject 0-9 Units into the skin 3 (three) times daily with meals. 09/19/18   Eugenie Filler, MD  Insulin Aspart, w/Niacinamide, (FIASP FLEXTOUCH) 100 UNIT/ML SOPN Inject 1-8 Units into the skin 3 (three) times daily. 09/19/18   Eugenie Filler, MD  Insulin Glargine (BASAGLAR KWIKPEN) 100 UNIT/ML SOPN Inject 0.22 mLs (22 Units total) into the skin daily. 09/19/18   Eugenie Filler, MD  Insulin Pen Needle 31G X 5 MM MISC 1-8 Units by Does not apply route 3 (three) times daily. 09/19/18   Eugenie Filler, MD  levonorgestrel (MIRENA) 20 MCG/24HR IUD 1 each by Intrauterine route once. Placed 06/2014    [provider]  misoprostol (CYTOTEC) 200 MCG tablet Place 2 tablets vaginally 6-12 hours prior to IUD insertion 08/25/18   Salvadore Dom, MD  ondansetron (ZOFRAN-ODT) 4 MG disintegrating tablet Take 4 mg by mouth every 8 (eight) hours as needed for nausea or vomiting.  09/10/18   [provider]  pantoprazole (PROTONIX) 40 MG tablet Take 1 tablet (40 mg total) by mouth daily. 09/19/18   Eugenie Filler, MD  promethazine (PHENERGAN) 25 MG tablet Take 1 tablet (25 mg total) by mouth every 6 (six) hours as needed for nausea or vomiting. 08/27/18   Kathrynn Ducking, MD  REXULTI 2 MG TABS  Take 2 mg by mouth daily. 08/02/18   [provider]  SPRAVATO, 84 MG DOSE, 28 MG/DEVICE SOPK Take 84 mg by mouth 2 (two) times a week.  09/17/18   [provider]  traZODone (DESYREL) 150 MG tablet Take 150 mg by mouth at bedtime as needed for sleep.  08/29/18   [provider]  zolpidem (AMBIEN) 10 MG tablet Take 10 mg by mouth at bedtime.     [provider]    Family History Family History  Adopted: Yes  Problem Relation Age of Onset  . Diabetes Father     Social History Social History   Tobacco Use  .  Smoking status: Never Smoker  . Smokeless tobacco: Never Used  Substance Use Topics  . Alcohol use: Yes    Comment: seldom  . Drug use: No     Allergies   Sulfa antibiotics, Wellbutrin [bupropion], Amoxicillin, and Codeine   Review of Systems Review of Systems Ten systems reviewed and are negative for acute change, except as noted in the HPI.    Physical Exam Updated Vital Signs BP 124/65 (BP Location: Left Arm)   Pulse (!) 102   Temp 98.6 F (37 C) (Oral)   Resp 17   Ht 5' 1" (1.549 m)   Wt 72.6 kg   SpO2 96%   BMI 30.23 kg/m   Physical Exam Vitals signs and nursing note reviewed.  Constitutional:      General: She is not in acute distress.    Appearance: She is well-developed. She is not diaphoretic.  HENT:     Head: Normocephalic and atraumatic.  Eyes:     General: No scleral icterus.    Conjunctiva/sclera: Conjunctivae normal.  Neck:     Musculoskeletal: Normal range of motion.  Cardiovascular:     Rate and Rhythm: Normal rate and regular rhythm.     Heart sounds: Normal heart sounds. No murmur. No friction rub. No gallop.   Pulmonary:     Effort: Pulmonary effort is normal. No respiratory distress.     Breath sounds: Normal breath sounds.  Abdominal:     General: Bowel sounds are normal. There is no distension.     Palpations: Abdomen is soft. There is no mass.     Tenderness: There is abdominal tenderness in  the suprapubic area. There is right CVA tenderness and left CVA tenderness. There is no guarding.  Skin:    General: Skin is warm and dry.  Neurological:     Mental Status: She is alert and oriented to person, place, and time.  Psychiatric:        Behavior: Behavior normal.      ED Treatments / Results  Labs (all labs ordered are listed, but only abnormal results are displayed) Labs Reviewed  URINALYSIS, ROUTINE W REFLEX MICROSCOPIC - Abnormal; Notable for the following components:      Result Value   Color, Urine ORANGE (*)    APPearance CLOUDY (*)    Glucose, UA   (*)    Value: TEST NOT REPORTED DUE TO COLOR INTERFERENCE OF URINE PIGMENT   Hgb urine dipstick   (*)    Value: TEST NOT REPORTED DUE TO COLOR INTERFERENCE OF URINE PIGMENT   Bilirubin Urine   (*)    Value: TEST NOT REPORTED DUE TO COLOR INTERFERENCE OF URINE PIGMENT   Ketones, ur   (*)    Value: TEST NOT REPORTED DUE TO COLOR INTERFERENCE OF URINE PIGMENT   Protein, ur   (*)    Value: TEST NOT REPORTED DUE TO COLOR INTERFERENCE OF URINE PIGMENT   Nitrite   (*)    Value: TEST NOT REPORTED DUE TO COLOR INTERFERENCE OF URINE PIGMENT   Leukocytes,Ua   (*)    Value: TEST NOT REPORTED DUE TO COLOR INTERFERENCE OF URINE PIGMENT   RBC / HPF >50 (*)    WBC, UA >50 (*)    Bacteria, UA MANY (*)    All other components within normal limits  CBG MONITORING, ED - Abnormal; Notable for the following components:   Glucose-Capillary 165 (*)    All other components within normal limits  URINE CULTURE  BASIC METABOLIC PANEL  CBC  I-STAT BETA HCG BLOOD, ED ([facility], [facility], [facility] ONLY)    EKG    Radiology No results found.  Procedures Procedures (including critical care time)  Medications Ordered in ED Medications  sodium chloride 0.9 % bolus 1,000 mL (1,000 mLs Intravenous New Bag/Given 12/18/18 1247)  cefTRIAXone (ROCEPHIN) 1 g in sodium chloride 0.9 % 100 mL IVPB (0 g Intravenous Stopped 12/18/18 1318)  fentaNYL (SUBLIMAZE)  injection 50 mcg (50 mcg Intravenous Given 12/18/18 1311)  ondansetron (ZOFRAN) injection 4 mg (4 mg Intravenous Given 12/18/18 1311)     Initial Impression / Assessment and Plan / ED Course  I have reviewed the triage vital signs and the nursing notes.  Pertinent labs & imaging results that were available during my care of the patient were reviewed by me and considered in my medical decision making (see chart for details).  Clinical Course as of Dec 18 1722  Thu Dec 18, 2018  1549 WBC(!): 13.6 [AH]    Clinical Course User Index [AH] Margarita Mail, PA-C       CC: Dysuria, back pain VS:  Vitals:   12/18/18 1430 12/18/18 1500 12/18/18 1600 12/18/18 1630  BP: 108/71 102/67 108/84 106/63  Pulse: 94 97 100 (!) 101  Resp:  17  18  Temp:  98.5 F (36.9 C)  98.6 F (37 C)  TempSrc:  Oral  Oral  SpO2: 96% 97% 98% 98%  Weight:      Height:        PV:XYIAXKP is gathered by patient and EMR. DDX: The differential diagnosis of emergent flank pain includes, but is not limited to :Abdominal aortic aneurysm,, Renal artery embolism,Renal vein thrombosis, Aortic dissection, Mesenteric ischemia, Pyelonephritis, Renal infarction, Renal hemorrhage, Nephrolithiasis/ Renal Colic, Bladder tumor,Cystitis, Biliary colic, Pancreatitis Perforated peptic ulcer Appendicitis ,Inguinal Hernia, Diverticulitis, Bowel obstruction Ectopic Pregnancy,PID/TOA,Ovarian cyst, Ovarian torsion)  Shingles Lower lobe pneumonia, Retroperitoneal hematoma/abscess/tumor, Epidural abscess, Epidural hematoma Labs: I reviewed the labs which show UA appears infected.  BMP with elevated blood glucose, white cell count of 13,000, negative pregnancy.  hCG is negative for pregnancy Imaging: I personally reviewed the images (CT renal stone study) which show(s) no acute abnormalities.  There is a kidney stone in the renal pole without evidence of ureterolithiasis. EKG: N/A MDM: Patient with urinary tract infection, white count  elevated.  Her blood sugar is well controlled at 165.  She is been given fluids, Rocephin.  Have sent out for urine culture.  No evidence of infected stone.  She will be discharged on Macrobid which she states works best for her urinary tract infections.  Patient appears appropriate for discharge at this time Patient disposition: Discharge Patient condition: Good. The patient appears reasonably screened and/or stabilized for discharge and I doubt any other medical condition or other [facility] requiring further screening, evaluation, or treatment in the ED at this time prior to discharge. I have discussed lab and/or imaging findings with the patient and answered all questions/concerns to the best of my ability. I have discussed return precautions and OP follow up.      Final Clinical Impressions(s) / ED Diagnoses   Final diagnoses:  None    ED Discharge Orders    None       Margarita Mail, PA-C 12/18/18 1727    Davonna Belling, MD 12/18/18 2201

## 2018-12-18 NOTE — ED Triage Notes (Signed)
Patient reports that she had blood in her urine and dysuria x 2 days. Patient states she took an AZO tablet today. Patient states her blood sugar at home was 440 this AM. CBG in triage-165.Marland Kitchen

## 2018-12-18 NOTE — ED Notes (Signed)
Patient transported to CT 

## 2018-12-18 NOTE — Discharge Instructions (Signed)
Contact a health care provider if:  Your symptoms do not get better after 1-2 days.  Your symptoms go away and then return.  Get help right away if you have:  Severe pain in your back or your lower abdomen.  A fever.  Nausea or vomiting.

## 2018-12-19 LAB — URINE CULTURE

## 2018-12-20 ENCOUNTER — Other Ambulatory Visit: Payer: Self-pay

## 2018-12-20 ENCOUNTER — Emergency Department (HOSPITAL_COMMUNITY)
Admission: EM | Admit: 2018-12-20 | Discharge: 2018-12-20 | Disposition: A | Discharge status: Home / Self Care | Payer: Self-pay | Attending: Emergency Medicine | Admitting: Emergency Medicine

## 2018-12-20 ENCOUNTER — Encounter (HOSPITAL_COMMUNITY): Payer: Self-pay

## 2018-12-20 DIAGNOSIS — E042 Nontoxic multinodular goiter: Secondary | ICD-10-CM | POA: Insufficient documentation

## 2018-12-20 DIAGNOSIS — N390 Urinary tract infection, site not specified: Secondary | ICD-10-CM | POA: Insufficient documentation

## 2018-12-20 DIAGNOSIS — E1069 Type 1 diabetes mellitus with other specified complication: Secondary | ICD-10-CM | POA: Insufficient documentation

## 2018-12-20 DIAGNOSIS — E785 Hyperlipidemia, unspecified: Secondary | ICD-10-CM | POA: Insufficient documentation

## 2018-12-20 DIAGNOSIS — Z79899 Other long term (current) drug therapy: Secondary | ICD-10-CM | POA: Insufficient documentation

## 2018-12-20 HISTORY — DX: Sepsis, unspecified organism: A41.9

## 2018-12-20 LAB — CBC WITH DIFFERENTIAL/PLATELET
Abs Immature Granulocytes: 0.01 10*3/uL (ref 0.00–0.07)
Basophils Absolute: 0 10*3/uL (ref 0.0–0.1)
Basophils Relative: 0 %
Eosinophils Absolute: 0.2 10*3/uL (ref 0.0–0.5)
Eosinophils Relative: 3 %
HCT: 40.1 % (ref 36.0–46.0)
Hemoglobin: 12.9 g/dL (ref 12.0–15.0)
Immature Granulocytes: 0 %
Lymphocytes Relative: 33 %
Lymphs Abs: 2.4 10*3/uL (ref 0.7–4.0)
MCH: 29.7 pg (ref 26.0–34.0)
MCHC: 32.2 g/dL (ref 30.0–36.0)
MCV: 92.4 fL (ref 80.0–100.0)
Monocytes Absolute: 0.5 10*3/uL (ref 0.1–1.0)
Monocytes Relative: 7 %
Neutro Abs: 4.1 10*3/uL (ref 1.7–7.7)
Neutrophils Relative %: 57 %
Platelets: 349 10*3/uL (ref 150–400)
RBC: 4.34 MIL/uL (ref 3.87–5.11)
RDW: 12.3 % (ref 11.5–15.5)
WBC: 7.2 10*3/uL (ref 4.0–10.5)
nRBC: 0 % (ref 0.0–0.2)

## 2018-12-20 LAB — LIPASE, BLOOD: Lipase: 21 U/L (ref 11–51)

## 2018-12-20 LAB — URINALYSIS, ROUTINE W REFLEX MICROSCOPIC
Bilirubin Urine: NEGATIVE
Glucose, UA: NEGATIVE mg/dL
Ketones, ur: NEGATIVE mg/dL
Leukocytes,Ua: NEGATIVE
Nitrite: POSITIVE — AB
Protein, ur: NEGATIVE mg/dL
Specific Gravity, Urine: 1.01 (ref 1.005–1.030)
pH: 5 (ref 5.0–8.0)

## 2018-12-20 LAB — COMPREHENSIVE METABOLIC PANEL
ALT: 12 U/L (ref 0–44)
AST: 13 U/L — ABNORMAL LOW (ref 15–41)
Albumin: 3.7 g/dL (ref 3.5–5.0)
Alkaline Phosphatase: 50 U/L (ref 38–126)
Anion gap: 9 (ref 5–15)
BUN: 9 mg/dL (ref 6–20)
CO2: 24 mmol/L (ref 22–32)
Calcium: 9.1 mg/dL (ref 8.9–10.3)
Chloride: 106 mmol/L (ref 98–111)
Creatinine, Ser: 0.83 mg/dL (ref 0.44–1.00)
GFR calc Af Amer: >60 mL/min (ref >60)
GFR calc non Af Amer: >60 mL/min (ref >60)
Glucose, Bld: 69 mg/dL — ABNORMAL LOW (ref 70–99)
Potassium: 3.8 mmol/L (ref 3.5–5.1)
Sodium: 139 mmol/L (ref 135–145)
Total Bilirubin: 0.2 mg/dL — ABNORMAL LOW (ref 0.3–1.2)
Total Protein: 6.7 g/dL (ref 6.5–8.1)

## 2018-12-20 LAB — HCG, QUANTITATIVE, PREGNANCY: hCG, Beta Chain, Quant, S: <1 m[IU]/mL (ref <5)

## 2018-12-20 MED ORDER — ONDANSETRON HCL 4 MG/2ML IJ SOLN
4.0000 mg | Freq: Once | INTRAMUSCULAR | Status: AC
  Administered 2018-12-20: 4 mg via INTRAVENOUS
  Filled 2018-12-20: qty 2

## 2018-12-20 MED ORDER — CEPHALEXIN 500 MG PO CAPS: 500.0000 mg | ORAL_CAPSULE | Freq: Two times a day (BID) | ORAL | 0 refills | Status: AC

## 2018-12-20 MED ORDER — METOCLOPRAMIDE HCL 5 MG/ML IJ SOLN
10.0000 mg | Freq: Once | INTRAMUSCULAR | Status: AC
  Administered 2018-12-20: 10 mg via INTRAVENOUS
  Filled 2018-12-20: qty 2

## 2018-12-20 MED ORDER — SODIUM CHLORIDE 0.9 % IV BOLUS
1000.0000 mL | Freq: Once | INTRAVENOUS | Status: AC
  Administered 2018-12-20: 1000 mL via INTRAVENOUS

## 2018-12-20 MED ORDER — MORPHINE SULFATE (PF) 4 MG/ML IV SOLN
4.0000 mg | Freq: Once | INTRAVENOUS | Status: AC
  Administered 2018-12-20: 4 mg via INTRAVENOUS
  Filled 2018-12-20: qty 1

## 2018-12-20 MED ORDER — DIPHENHYDRAMINE HCL 50 MG/ML IJ SOLN
12.5000 mg | Freq: Once | INTRAMUSCULAR | Status: AC
  Administered 2018-12-20: 12.5 mg via INTRAVENOUS
  Filled 2018-12-20: qty 1

## 2018-12-20 MED ORDER — SODIUM CHLORIDE 0.9 % IV SOLN
1.0000 g | Freq: Once | INTRAVENOUS | Status: AC
  Administered 2018-12-20: 1 g via INTRAVENOUS
  Filled 2018-12-20: qty 10

## 2018-12-20 MED ORDER — PROMETHAZINE HCL 25 MG RE SUPP: 25.0000 mg | Freq: Four times a day (QID) | RECTAL | 0 refills | Status: DC | PRN

## 2018-12-20 NOTE — ED Provider Notes (Signed)
[facility] DEPT Provider Note   CSN: [id number] Arrival date & time: 12/20/18  1021    History   Chief Complaint Chief Complaint  Patient presents with  . Abdominal Pain  . Urinary Tract Infection    HPI [name] is a 43 y.o. female.     43yo female with history of recurrent UTIs, reports pelvic cramping 4 days ago similar to menstrual cramps but states does not have a period due to IUD. Woke up the next day not feeling well in general, took Motrin and felt better. Woke up Thursday morning (2 days ago), felt unwell, suspected UTI, went to the bathroom and noted blood in her urine. Took AZO with plan to go to PCP on Thursday but could not wait for appointment due to pain and came to the ER. Given abx, fluids, fentynal (without relief). Taking Macrobid. Reports ongoing pain in her urethra, urgency, difficulty sleeping, unable to eat due to pain/nausea/vomiting. Denies diarrhea, reports last bowel movement was Sunday, does not feel urge, believes decrease in stool is due to not eating this week. Denies vaginal discharge, patient is sexually active but not in 3 weeks, no new partners. Denies fevers, chills, sweats. CBG 201 at home today, normally 80-170, dosing insulin sliding scale due to lack of PO intake.      Past Medical History:  Diagnosis Date  . Adopted   . Cluster headache syndrome, intractable   . Depression   . Migraine without aura, with intractable migraine, so stated, without mention of status migrainosus 12/05/2012  . Ovarian cyst   . Sepsis ([place])   . STD (sexually transmitted disease)    HSV II   . Type I diabetes mellitus [facility])     Patient Active Problem List   Diagnosis Date Noted  . Dehydration 09/17/2018  . Gastroesophageal reflux disease   . ADHD   . Volume depletion 07/21/2018  . Diabetic ketoacidosis ([place]) 07/21/2018  . Morbid obesity ([place]) 06/16/2018  . Chronic tension-type headache, intractable 06/03/2018  .  Multiple thyroid nodules 04/17/2018  . 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 ([place]) 01/01/2018  . DKA, type 1 ([place]) 02/14/2015  . DKA (diabetic ketoacidoses) ([place]) 02/14/2015  . Thyroid nodule 02/14/2015  . Depression with anxiety 02/14/2015  . Acute kidney injury ([place]) 02/14/2015  . Leukocytosis 02/14/2015  . Increased anion gap metabolic acidosis 34/19/3790  . History of frequent urinary tract infections 01/25/2014  . Anxiety 12/04/2013  . Neuropathic pain 12/04/2013  . Recurrent UTI 12/04/2013  . Aphakia of left eye 06/23/2013  . Retinal detachment 06/23/2013  . PDR (proliferative diabetic retinopathy) ([place]) 06/23/2013  . Recurrent major depression-severe ([place]) 01/06/2013  . Generalized anxiety disorder 01/06/2013  . Common migraine with intractable migraine 12/05/2012  . IDDM (insulin dependent diabetes mellitus) ([place]) 12/05/2012  . RLQ abdominal pain 11/30/2011    Past Surgical History:  Procedure Laterality Date  . APPENDECTOMY    . BUNIONECTOMY Bilateral   . CHOLECYSTECTOMY    . EYE SURGERY    . OVARIAN CYST REMOVAL       OB History    Gravida  0   Para  0   Term  0   Preterm  0   AB  0   Living  0     SAB  0   TAB  0   Ectopic  0   Multiple  0   Live Births  0  Home Medications    Prior to Admission medications   Medication Sig Start Date End Date Taking? Authorizing Provider  amphetamine-dextroamphetamine (ADDERALL) 20 MG tablet Take 20 mg by mouth 3 (three) times daily.  10/24/17  Yes [provider]  benzonatate (TESSALON) 100 MG capsule Take 100 mg by mouth 3 (three) times daily as needed for cough.  08/26/18  Yes [provider]  clonazePAM (KLONOPIN) 0.5 MG tablet Take 0.5 mg by mouth 3 (three) times daily as needed for anxiety. For anxiety 01/09/13  Yes Niel Hummer, NP  DULoxetine (CYMBALTA) 60 MG capsule Take 120 mg by mouth daily.   Yes [provider]  gabapentin (NEURONTIN) 300 MG  capsule Take 300 mg by mouth 3 (three) times daily as needed (pain).  08/27/18  Yes [provider]  ibuprofen (ADVIL,MOTRIN) 800 MG tablet Take 1 tablet (800 mg total) by mouth every 8 (eight) hours as needed. Patient taking differently: Take 800 mg by mouth every 8 (eight) hours as needed for moderate pain.  08/25/18  Yes Salvadore Dom, MD  Insulin Aspart, w/Niacinamide, (FIASP FLEXTOUCH) 100 UNIT/ML SOPN Inject 1-8 Units into the skin 3 (three) times daily. Patient taking differently: Inject 1-8 Units into the skin 3 (three) times daily. Per sliding scale, 1 unit for 8 grams of carbs 09/19/18  Yes Eugenie Filler, MD  insulin degludec (TRESIBA) 100 UNIT/ML SOPN FlexTouch Pen Inject 23 Units into the skin at bedtime. 09/19/18  Yes [provider]  levonorgestrel (MIRENA) 20 MCG/24HR IUD 1 each by Intrauterine route once. Placed 06/2014   Yes [provider]  Multiple Vitamins-Minerals (MULTIVITAMIN GUMMIES WOMENS) CHEW Chew 1 each by mouth daily.   Yes [provider]  nitrofurantoin, macrocrystal-monohydrate, (MACROBID) 100 MG capsule Take 1 capsule (100 mg total) by mouth 2 (two) times daily. X 7 days 12/18/18  Yes Harris, Abigail, PA-C  ondansetron (ZOFRAN) 4 MG tablet Take 1 tablet (4 mg total) by mouth 4 (four) times daily as needed for nausea or vomiting. 12/18/18  Yes Harris, Abigail, PA-C  ondansetron (ZOFRAN-ODT) 4 MG disintegrating tablet Take 4 mg by mouth every 8 (eight) hours as needed for nausea or vomiting.  09/10/18  Yes [provider]  pantoprazole (PROTONIX) 40 MG tablet Take 1 tablet (40 mg total) by mouth daily. Patient taking differently: Take 40 mg by mouth daily as needed (acid reflux).  09/19/18  Yes Eugenie Filler, MD  REXULTI 2 MG TABS Take 2 mg by mouth every other day.  08/02/18  Yes [provider]  solifenacin (VESICARE) 10 MG tablet Take 10 mg by mouth daily.   Yes [provider]  traMADol (ULTRAM)  50 MG tablet Take 1 tablet (50 mg total) by mouth every 6 (six) hours as needed. Patient taking differently: Take 50 mg by mouth every 6 (six) hours as needed for moderate pain.  12/18/18  Yes Harris, Abigail, PA-C  traZODone (DESYREL) 150 MG tablet Take 150 mg by mouth at bedtime as needed for sleep.  08/29/18  Yes [provider]  zolpidem (AMBIEN) 10 MG tablet Take 10 mg by mouth at bedtime.    Yes [provider]  blood glucose meter kit and supplies KIT Dispense based on patient and insurance preference. Use up to four times daily as directed. (FOR ICD-9 250.00, 250.01). 07/23/18   Kayleen Memos, DO  cephALEXin (KEFLEX) 500 MG capsule Take 1 capsule (500 mg total) by mouth 2 (two) times daily for 3  days. 12/20/18 12/23/18  ,  A, PA-C  Insulin Pen Needle 31G X 5 MM MISC 1-8 Units by Does not apply route 3 (three) times daily. 09/19/18   Thompson, Daniel V, MD  promethazine (PHENERGAN) 25 MG suppository Place 1 suppository (25 mg total) rectally every 6 (six) hours as needed for nausea or vomiting. 12/20/18   ,  A, PA-C  dicyclomine (BENTYL) 20 MG tablet Take 1 tablet (20 mg total) by mouth 2 (two) times daily. Patient not taking: Reported on 12/20/2018 04/01/18 12/20/18  Yao, David Hsienta, MD  insulin aspart (NOVOLOG) 100 UNIT/ML injection Inject 0-9 Units into the skin 3 (three) times daily with meals. Patient not taking: Reported on 12/20/2018 09/19/18 12/20/18  Thompson, Daniel V, MD  Insulin Glargine (BASAGLAR KWIKPEN) 100 UNIT/ML SOPN Inject 0.22 mLs (22 Units total) into the skin daily. Patient not taking: Reported on 12/20/2018 09/19/18 12/20/18  Thompson, Daniel V, MD  misoprostol (CYTOTEC) 200 MCG tablet Place 2 tablets vaginally 6-12 hours prior to IUD insertion Patient not taking: Reported on 12/20/2018 08/25/18 12/20/18  Jertson, Jill Evelyn, MD    Family History Family History  Adopted: Yes  Problem Relation Age of Onset  . Diabetes Father     Social  History Social History   Tobacco Use  . Smoking status: Never Smoker  . Smokeless tobacco: Never Used  Substance Use Topics  . Alcohol use: Yes    Comment: seldom  . Drug use: No     Allergies   Sulfa antibiotics, Wellbutrin [bupropion], Amoxicillin, and Codeine   Review of Systems Review of Systems  Constitutional: Negative for chills, diaphoresis and fever.  Respiratory: Negative for shortness of breath.   Cardiovascular: Negative for chest pain.  Gastrointestinal: Positive for abdominal pain, constipation, nausea and vomiting. Negative for blood in stool and diarrhea.  Genitourinary: Positive for dysuria, flank pain, frequency, hematuria and urgency. Negative for vaginal bleeding and vaginal discharge.  Musculoskeletal: Negative for myalgias.  Skin: Negative for rash and wound.  Allergic/Immunologic: Positive for immunocompromised state.  Hematological: Negative for adenopathy.  Psychiatric/Behavioral: Positive for confusion.  All other systems reviewed and are negative.    Physical Exam Updated Vital Signs BP (!) 111/59   Pulse (!) 101   Temp 97.7 F (36.5 C) (Oral)   Resp 16   SpO2 94%   Physical Exam Vitals signs and nursing note reviewed.  Constitutional:      General: She is not in acute distress.    Appearance: She is well-developed. She is not diaphoretic.  HENT:     Head: Normocephalic and atraumatic.  Cardiovascular:     Rate and Rhythm: Regular rhythm. Tachycardia present.     Heart sounds: Normal heart sounds.  Pulmonary:     Effort: Pulmonary effort is normal.     Breath sounds: Normal breath sounds.  Abdominal:     Palpations: Abdomen is soft.     Tenderness: There is generalized abdominal tenderness. There is right CVA tenderness and left CVA tenderness.  Skin:    General: Skin is warm and dry.     Findings: No erythema or rash.  Neurological:     Mental Status: She is alert and oriented to person, place, and time.  Psychiatric:         Behavior: Behavior normal.      ED Treatments / Results  Labs (all labs ordered are listed, but only abnormal results are displayed) Labs Reviewed  COMPREHENSIVE METABOLIC PANEL - Abnormal; Notable for the following   components:      Result Value   Glucose, Bld 69 (*)    AST 13 (*)    Total Bilirubin 0.2 (*)    All other components within normal limits  URINALYSIS, ROUTINE W REFLEX MICROSCOPIC - Abnormal; Notable for the following components:   Color, Urine AMBER (*)    Hgb urine dipstick MODERATE (*)    Nitrite POSITIVE (*)    Bacteria, UA RARE (*)    All other components within normal limits  URINE CULTURE  CBC WITH DIFFERENTIAL/PLATELET  LIPASE, BLOOD  HCG, QUANTITATIVE, PREGNANCY    EKG None  Radiology No results found.  Procedures Procedures (including critical care time)  Medications Ordered in ED Medications  cefTRIAXone (ROCEPHIN) 1 g in sodium chloride 0.9 % 100 mL IVPB (1 g Intravenous New Bag/Given 12/20/18 1422)  sodium chloride 0.9 % bolus 1,000 mL (0 mLs Intravenous Stopped 12/20/18 1313)  ondansetron (ZOFRAN) injection 4 mg (4 mg Intravenous Given 12/20/18 1152)  morphine 4 MG/ML injection 4 mg (4 mg Intravenous Given 12/20/18 1152)  diphenhydrAMINE (BENADRYL) injection 12.5 mg (12.5 mg Intravenous Given 12/20/18 1152)  sodium chloride 0.9 % bolus 1,000 mL (1,000 mLs Intravenous New Bag/Given 12/20/18 1327)  metoCLOPramide (REGLAN) injection 10 mg (10 mg Intravenous Given 12/20/18 1422)     Initial Impression / Assessment and Plan / ED Course  I have reviewed the triage vital signs and the nursing notes.  Pertinent labs & imaging results that were available during my care of the patient were reviewed by me and considered in my medical decision making (see chart for details).  Clinical Course as of Dec 20 1426  Sat Dec 20, 2018  1423 43yo female with history of recurrent UTIs, seen in the ER 2 days ago for dysuria, hematuria, abdominal pain. CT with renal  stone, UA unreliable due to patient taking AZO, with many WBCs, RBCs, started on Macrobid; culture with many species and recommends recollection. Patient reports symptoms are not improving and returns to the ER. On exam, appears uncomfortable, generalized abdominal tenderness, left and right CVA tenderness. Denies vaginal dc/complaints/possibility of STI.  Review of lab work, lipase WNL, CMP unremarkable, CBC WNL. UA with moderate hgb, nitrite +, 11-20 WBC, rare bacteria. Patient with some improvement after zofran, morphine, 2L NS. States beginning to feel nauseous again. Ordered reglan, rocephin- has tolerated rocephin previously. Plan is to dc macrobid, started keflex, cx sent and advised to follow up with PCP on Monday, return to ER precautions given. Also given rx for phenergan PR. Doubt pyelonephritis, significant ureteral stone, acute abdominal pathology given reassuring labs, CT not repeated today, last CT 2 days ago, reviewed.    [LM]    Clinical Course User Index [LM] Tacy Learn, PA-C      Final Clinical Impressions(s) / ED Diagnoses   Final diagnoses:  Lower urinary tract infectious disease    ED Discharge Orders         Ordered    promethazine (PHENERGAN) 25 MG suppository  Every 6 hours PRN     12/20/18 1408    cephALEXin (KEFLEX) 500 MG capsule  2 times daily     12/20/18 1408           Roque Lias 12/20/18 1428    Virgel Manifold, MD 12/21/18 [phone]

## 2018-12-20 NOTE — Discharge Instructions (Addendum)
Urine culture sent out today. Plan to follow up with your doctor on Monday for recheck. Return to ER for new or worsening symptoms.  STOP Macrobid and start Keflex. Take Phenergan as needed for vomiting.

## 2018-12-20 NOTE — ED Triage Notes (Signed)
She tels me she was seen here this Wed. For lower abd. Pain and was dx at that time with "kidney stone and a UTI". She states she is "no better", in spite of having been prescribed Macrobid. She is in no distress. She states that about 6 years ago she had UTI with sepsis "and I want to be sure that isn't happening again".

## 2018-12-22 LAB — URINE CULTURE

## 2019-01-15 ENCOUNTER — Encounter (HOSPITAL_COMMUNITY): Payer: Self-pay

## 2019-01-15 ENCOUNTER — Other Ambulatory Visit: Payer: Self-pay

## 2019-01-15 ENCOUNTER — Telehealth: Payer: Self-pay | Admitting: Neurology

## 2019-01-15 ENCOUNTER — Emergency Department (HOSPITAL_COMMUNITY)
Admission: EM | Admit: 2019-01-15 | Discharge: 2019-01-15 | Disposition: A | Discharge status: Home / Self Care | Payer: Self-pay | Attending: Emergency Medicine | Admitting: Emergency Medicine

## 2019-01-15 DIAGNOSIS — Z79899 Other long term (current) drug therapy: Secondary | ICD-10-CM | POA: Insufficient documentation

## 2019-01-15 DIAGNOSIS — R112 Nausea with vomiting, unspecified: Secondary | ICD-10-CM | POA: Insufficient documentation

## 2019-01-15 DIAGNOSIS — Z794 Long term (current) use of insulin: Secondary | ICD-10-CM | POA: Insufficient documentation

## 2019-01-15 DIAGNOSIS — G43909 Migraine, unspecified, not intractable, without status migrainosus: Secondary | ICD-10-CM | POA: Insufficient documentation

## 2019-01-15 DIAGNOSIS — H53149 Visual discomfort, unspecified: Secondary | ICD-10-CM | POA: Insufficient documentation

## 2019-01-15 DIAGNOSIS — E109 Type 1 diabetes mellitus without complications: Secondary | ICD-10-CM | POA: Insufficient documentation

## 2019-01-15 MED ORDER — VALPROATE SODIUM 500 MG/5ML IV SOLN
500.0000 mg | Freq: Once | INTRAVENOUS | Status: AC
  Administered 2019-01-15: 500 mg via INTRAVENOUS
  Filled 2019-01-15: qty 5

## 2019-01-15 MED ORDER — CHLORPROMAZINE HCL 100 MG PO TABS: 100.0000 mg | ORAL_TABLET | Freq: Two times a day (BID) | ORAL | 0 refills | Status: DC

## 2019-01-15 MED ORDER — SODIUM CHLORIDE 0.9 % IV BOLUS
1000.0000 mL | Freq: Once | INTRAVENOUS | Status: AC
  Administered 2019-01-15: 1000 mL via INTRAVENOUS

## 2019-01-15 MED ORDER — PROMETHAZINE HCL 25 MG/ML IJ SOLN
25.0000 mg | Freq: Once | INTRAMUSCULAR | Status: AC
  Administered 2019-01-15: 25 mg via INTRAVENOUS
  Filled 2019-01-15: qty 1

## 2019-01-15 MED ORDER — VALPROATE SODIUM 500 MG/5ML IV SOLN
500.0000 mg | Freq: Once | INTRAVENOUS | Status: DC
  Filled 2019-01-15: qty 5

## 2019-01-15 NOTE — Telephone Encounter (Signed)
The patient was seen through the ER today, she has a 4-day headache, she has not been seen through this office since May 2019.  The patient has gained benefit with Depacon in the past, they have given her 500 mg, she may get a repeat dose but it needs to be given rapidly, within 5 minutes.  If this is not effective, a 3-day course of Thorazine 100 mg twice daily may help her.  She has a lot of nausea associated with her current headache.  She will eventually need a revisit through this office.  She does have a revisit set up for 20 January 2019.

## 2019-01-15 NOTE — ED Provider Notes (Signed)
[place] [facility] DEPT Provider Note   CSN: [id number] Arrival date & time: 01/15/19  1137    History   Chief Complaint Chief Complaint  Patient presents with  . Migraine  . Emesis    HPI [name] is a 43 y.o. female.     [name] is a 44 y.o. female with a history of migraines, diabetes, ovarian cysts, depression, who presents to the emergency department for evaluation of headache.  She reports she has had a severe right-sided migraine that is been persistent over the past 4 days.  She reports associated nausea and vomiting which been worsening and now she is unable to keep anything down.  She reports she called her neurologist, Dr. Jannifer Franklin, but could not be seen in the office for another 5 days and at home medications are not helping.  She reports associated light sensitivity, at baseline she only has vision out of the right eye due to a detached retina and eye injury on the left.  She denies any numbness weakness or tingling in her face or extremities.  Reports this feels primarily like her typical migraine.  Nausea and vomiting is also common for her.  She reports she commonly goes to Dr. Tobey Grim office when she gets severe headaches like this and is given injections of Depakote and phenergan with improvement.  She denies any fevers, neck pain or stiffness.  This headache did not come on suddenly, is not the worst headache of her life.     Past Medical History:  Diagnosis Date  . Adopted   . Cluster headache syndrome, intractable   . Depression   . Migraine without aura, with intractable migraine, so stated, without mention of status migrainosus 12/05/2012  . Ovarian cyst   . Sepsis ([place])   . STD (sexually transmitted disease)    HSV II   . Type I diabetes mellitus [facility])     Patient Active Problem List   Diagnosis Date Noted  . Dehydration 09/17/2018  . Gastroesophageal reflux disease   . ADHD   . Volume depletion 07/21/2018  .  Diabetic ketoacidosis ([place]) 07/21/2018  . Morbid obesity ([place]) 06/16/2018  . Chronic tension-type headache, intractable 06/03/2018  . Multiple thyroid nodules 04/17/2018  . 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 ([place]) 01/01/2018  . DKA, type 1 ([place]) 02/14/2015  . DKA (diabetic ketoacidoses) ([place]) 02/14/2015  . Thyroid nodule 02/14/2015  . Depression with anxiety 02/14/2015  . Acute kidney injury ([place]) 02/14/2015  . Leukocytosis 02/14/2015  . Increased anion gap metabolic acidosis 09/73/5329  . History of frequent urinary tract infections 01/25/2014  . Anxiety 12/04/2013  . Neuropathic pain 12/04/2013  . Recurrent UTI 12/04/2013  . Aphakia of left eye 06/23/2013  . Retinal detachment 06/23/2013  . PDR (proliferative diabetic retinopathy) ([place]) 06/23/2013  . Recurrent major depression-severe ([place]) 01/06/2013  . Generalized anxiety disorder 01/06/2013  . Common migraine with intractable migraine 12/05/2012  . IDDM (insulin dependent diabetes mellitus) ([place]) 12/05/2012  . RLQ abdominal pain 11/30/2011    Past Surgical History:  Procedure Laterality Date  . APPENDECTOMY    . BUNIONECTOMY Bilateral   . CHOLECYSTECTOMY    . EYE SURGERY    . OVARIAN CYST REMOVAL       OB History    Gravida  0   Para  0   Term  0   Preterm  0   AB  0   Living  0     SAB  0   TAB  0   Ectopic  0   Multiple  0   Live Births  0            Home Medications    Prior to Admission medications   Medication Sig Start Date End Date Taking? Authorizing Provider  amphetamine-dextroamphetamine (ADDERALL) 20 MG tablet Take 20 mg by mouth 3 (three) times daily.  10/24/17  Yes [provider]  benzonatate (TESSALON) 100 MG capsule Take 100 mg by mouth 3 (three) times daily as needed for cough.  08/26/18  Yes [provider]  blood glucose meter kit and supplies KIT Dispense based on patient and insurance preference. Use up to four times daily as directed. (FOR  ICD-9 250.00, 250.01). 07/23/18  Yes Hall, Carole N, DO  clonazePAM (KLONOPIN) 0.5 MG tablet Take 0.5 mg by mouth 3 (three) times daily as needed for anxiety. For anxiety 01/09/13  Yes Niel Hummer, NP  DULoxetine (CYMBALTA) 60 MG capsule Take 120 mg by mouth daily.   Yes [provider]  gabapentin (NEURONTIN) 300 MG capsule Take 300 mg by mouth 3 (three) times daily as needed (pain).  08/27/18  Yes [provider]  ibuprofen (ADVIL,MOTRIN) 800 MG tablet Take 1 tablet (800 mg total) by mouth every 8 (eight) hours as needed. Patient taking differently: Take 800 mg by mouth every 8 (eight) hours as needed for moderate pain.  08/25/18  Yes Salvadore Dom, MD  Insulin Aspart, w/Niacinamide, (FIASP FLEXTOUCH) 100 UNIT/ML SOPN Inject 1-8 Units into the skin 3 (three) times daily. Patient taking differently: Inject 1-8 Units into the skin 3 (three) times daily. Per sliding scale, 1 unit for 8 grams of carbs 09/19/18  Yes Eugenie Filler, MD  insulin degludec (TRESIBA) 100 UNIT/ML SOPN FlexTouch Pen Inject 23 Units into the skin at bedtime. 09/19/18  Yes [provider]  Insulin Pen Needle 31G X 5 MM MISC 1-8 Units by Does not apply route 3 (three) times daily. 09/19/18  Yes Eugenie Filler, MD  levonorgestrel (MIRENA) 20 MCG/24HR IUD 1 each by Intrauterine route once. Placed 06/2014   Yes [provider]  Multiple Vitamins-Minerals (MULTIVITAMIN GUMMIES WOMENS) CHEW Chew 1 each by mouth daily.   Yes [provider]  ondansetron (ZOFRAN-ODT) 4 MG disintegrating tablet Take 4 mg by mouth every 8 (eight) hours as needed for nausea or vomiting.  09/10/18  Yes [provider]  pantoprazole (PROTONIX) 40 MG tablet Take 1 tablet (40 mg total) by mouth daily. Patient taking differently: Take 40 mg by mouth daily as needed (acid reflux).  09/19/18  Yes Eugenie Filler, MD  REXULTI 2 MG TABS Take 2 mg by mouth every other day.  08/02/18  Yes [provider]  solifenacin (VESICARE) 10 MG tablet Take 10 mg by mouth daily.   Yes [provider]  traMADol (ULTRAM) 50 MG tablet Take 1 tablet (50 mg total) by mouth every 6 (six) hours as needed. Patient taking differently: Take 50 mg by mouth every 6 (six) hours as needed for moderate pain.  12/18/18  Yes Harris, Abigail, PA-C  traZODone (DESYREL) 150 MG tablet Take 150 mg by mouth at bedtime as needed for sleep.  08/29/18  Yes [provider]  zolpidem (AMBIEN) 10 MG tablet Take 10 mg by mouth at bedtime.    Yes [provider]  chlorproMAZINE (THORAZINE) 100 MG tablet Take 1 tablet (100 mg total) by mouth 2 (two) times a  day for 3 days. 01/15/19 01/18/19  Jacqlyn Larsen, PA-C  nitrofurantoin, macrocrystal-monohydrate, (MACROBID) 100 MG capsule Take 1 capsule (100 mg total) by mouth 2 (two) times daily. X 7 days Patient not taking: Reported on 01/15/2019 12/18/18   Margarita Mail, PA-C  ondansetron (ZOFRAN) 4 MG tablet Take 1 tablet (4 mg total) by mouth 4 (four) times daily as needed for nausea or vomiting. Patient not taking: Reported on 01/15/2019 12/18/18   Margarita Mail, PA-C  promethazine (PHENERGAN) 25 MG suppository Place 1 suppository (25 mg total) rectally every 6 (six) hours as needed for nausea or vomiting. Patient not taking: Reported on 01/15/2019 12/20/18   Suella Broad A, PA-C  dicyclomine (BENTYL) 20 MG tablet Take 1 tablet (20 mg total) by mouth 2 (two) times daily. Patient not taking: Reported on 12/20/2018 04/01/18 12/20/18  Drenda Freeze, MD  insulin aspart (NOVOLOG) 100 UNIT/ML injection Inject 0-9 Units into the skin 3 (three) times daily with meals. Patient not taking: Reported on 12/20/2018 09/19/18 12/20/18  Eugenie Filler, MD  Insulin Glargine [facility]) 100 UNIT/ML SOPN Inject 0.22 mLs (22 Units total) into the skin daily. Patient not taking: Reported on 12/20/2018 09/19/18 12/20/18  Eugenie Filler, MD  misoprostol (CYTOTEC) 200  MCG tablet Place 2 tablets vaginally 6-12 hours prior to IUD insertion Patient not taking: Reported on 12/20/2018 08/25/18 12/20/18  Salvadore Dom, MD    Family History Family History  Adopted: Yes  Problem Relation Age of Onset  . Diabetes Father     Social History Social History   Tobacco Use  . Smoking status: Never Smoker  . Smokeless tobacco: Never Used  Substance Use Topics  . Alcohol use: Yes    Comment: seldom  . Drug use: No     Allergies   Sulfa antibiotics, Wellbutrin [bupropion], Amoxicillin, and Codeine   Review of Systems Review of Systems  Constitutional: Negative for chills and fever.  HENT: Negative.   Eyes: Positive for photophobia. Negative for visual disturbance.  Respiratory: Negative for cough and shortness of breath.   Cardiovascular: Negative for chest pain.  Gastrointestinal: Positive for nausea and vomiting. Negative for abdominal pain.  Genitourinary: Negative for dysuria and frequency.  Musculoskeletal: Negative for arthralgias, back pain, neck pain and neck stiffness.  Skin: Negative for color change and rash.  Neurological: Positive for headaches. Negative for dizziness, syncope, facial asymmetry, speech difficulty, weakness, light-headedness and numbness.     Physical Exam Updated Vital Signs BP 132/89 (BP Location: Left Arm)   Pulse (!) 102   Temp 99.4 F (37.4 C) (Oral)   Resp 16   Ht _0  (1.549 m)   Wt 74.8 kg   SpO2 100%   BMI 31.18 kg/m   Physical Exam Vitals signs and nursing note reviewed.  Constitutional:      General: She is not in acute distress.    Appearance: She is well-developed. She is not diaphoretic.  HENT:     Head: Normocephalic and atraumatic.  Eyes:     General:        Right eye: No discharge.        Left eye: No discharge.     Extraocular Movements: Extraocular movements intact.     Conjunctiva/sclera: Conjunctivae normal.     Pupils: Pupils are equal, round, and reactive to light.  Neck:      Musculoskeletal: Neck supple.  Cardiovascular:     Rate and Rhythm: Normal rate and regular rhythm.  Heart sounds: Normal heart sounds.  Pulmonary:     Effort: Pulmonary effort is normal. No respiratory distress.     Breath sounds: Normal breath sounds. No wheezing or rales.     Comments: Respirations equal and unlabored, patient able to speak in full sentences, lungs clear to auscultation bilaterally Abdominal:     General: Bowel sounds are normal. There is no distension.     Palpations: Abdomen is soft. There is no mass.     Tenderness: There is no abdominal tenderness. There is no guarding.     Comments: Abdomen soft, nondistended, nontender to palpation in all quadrants without guarding or peritoneal signs  Musculoskeletal:        General: No deformity.  Skin:    General: Skin is warm and dry.     Capillary Refill: Capillary refill takes less than 2 seconds.  Neurological:     Mental Status: She is alert and oriented to person, place, and time.     Coordination: Coordination normal.     Comments: Speech is clear, able to follow commands CN III-XII intact Normal strength in upper and lower extremities bilaterally including dorsiflexion and plantar flexion, strong and equal grip strength Sensation normal to light and sharp touch Moves extremities without ataxia, coordination intact Normal finger to nose and rapid alternating movements No pronator drift  Psychiatric:        Mood and Affect: Mood normal.        Behavior: Behavior normal.      ED Treatments / Results  Labs (all labs ordered are listed, but only abnormal results are displayed) Labs Reviewed - No data to display  EKG None  Radiology No results found.  Procedures Procedures (including critical care time)  Medications Ordered in ED Medications  sodium chloride 0.9 % bolus 1,000 mL (0 mLs Intravenous Stopped 01/15/19 1519)  promethazine (PHENERGAN) injection 25 mg (25 mg Intravenous Given 01/15/19  1236)  valproate (DEPACON) 500 mg in dextrose 5 % 50 mL IVPB (0 mg Intravenous Stopped 01/15/19 1421)  valproate (DEPACON) 500 mg in dextrose 5 % 50 mL IVPB (0 mg Intravenous Stopped 01/15/19 1441)     Initial Impression / Assessment and Plan / ED Course  I have reviewed the triage vital signs and the nursing notes.  Pertinent labs & imaging results that were available during my care of the patient were reviewed by me and considered in my medical decision making (see chart for details).  Patient with history of recurrent migraines presenting with headache that is similar to her usual migraine.  She has associated nausea and vomiting.  Not improving with medications at home.  Called her neurologist, Dr. Jannifer Franklin but was unable to get an appointment to be seen today.  On exam she has no focal neurologic deficits.  No red flag symptoms.  Vitals are normal.  Reviewed chart, and patient typically receives injections of Depakote and Phenergan in the office for her migraines will give these medications here today.  After initial medications patient reports some improvement in her headache and complete resolution of her nausea.  Will discuss with Dr. Jannifer Franklin for further treatment of migraine.  Called office and spoke with Dr. Jannifer Franklin who recommends giving additional dose of Depakote but pushing it over 5 minutes rather than an hour, he reports that she still has some residual headache he recommends Thorazine 100 mg twice daily for 3 days.  Patient has a follow-up appointment with him next Tuesday, and she should call the office  for any further issues.  I discussed this plan with the patient she expresses agreement.  Patient reports continued improvement in her headache after additional dose of Depakote, she is agreeable with going home with 3-day Thorazine course.  Discussed appropriate return precautions.  Patient expresses understanding and agreement with plan.  She is discharged home in good condition.   Final Clinical Impressions(s) / ED Diagnoses   Final diagnoses:  Migraine without status migrainosus, not intractable, unspecified migraine type    ED Discharge Orders         Ordered    chlorproMAZINE (THORAZINE) 100 MG tablet  2 times daily     01/15/19 1459           Jacqlyn Larsen, [place] 01/15/19 1530    Long, [place], MD 01/15/19 1924

## 2019-01-15 NOTE — Discharge Instructions (Signed)
For continued headache you can take Thorazine twice daily for the next 3 days.  I am reassured that your headache is already starting to improve.  Follow-up with Dr. Jannifer Franklin next week as planned.  Get help right away if: Your migraine headache gets very bad. Your migraine headache lasts longer than 72 hours. You have a fever. You have a stiff neck. You have trouble seeing. Your muscles feel weak or like you cannot control them. You start to lose your balance a lot. You start to have trouble walking. You pass out (faint). You have a seizure.

## 2019-01-15 NOTE — ED Triage Notes (Signed)
Patient c/o migraine and N/V x 4 days. Patient states she called her neurologist,but can not be seen for another 5 days. Patient reports sensitivity to  Light and sound.
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
## 2019-01-20 ENCOUNTER — Other Ambulatory Visit: Payer: Self-pay

## 2019-01-20 ENCOUNTER — Encounter: Payer: Self-pay | Admitting: Neurology

## 2019-01-20 ENCOUNTER — Ambulatory Visit: Payer: Self-pay | Admitting: Neurology

## 2019-01-20 VITALS — BP 127/83 | HR 108 | Temp 98.4°F | Ht 61.0 in | Wt 166.7 lb

## 2019-01-20 DIAGNOSIS — G43019 Migraine without aura, intractable, without status migrainosus: Secondary | ICD-10-CM

## 2019-01-20 MED ORDER — DIVALPROEX SODIUM 500 MG PO DR TAB: DELAYED_RELEASE_TABLET | ORAL | 3 refills | Status: DC

## 2019-01-20 MED ORDER — DEXAMETHASONE 2 MG PO TABS: ORAL_TABLET | ORAL | 0 refills | Status: DC

## 2019-01-20 NOTE — Progress Notes (Signed)
I have read the note, and I agree with the clinical assessment and plan.  Bridgit Eynon K Adalina Dopson   

## 2019-01-20 NOTE — Patient Instructions (Signed)
Valproic Acid, Divalproex Sodium delayed or extended-release tablets What is this medicine? DIVALPROEX SODIUM (dye VAL pro ex SO dee um) is used to prevent seizures caused by some forms of epilepsy. It is also used to treat bipolar mania and to prevent migraine headaches. This medicine may be used for other purposes; ask your health care provider or pharmacist if you have questions. COMMON BRAND NAME(S): Depakote, Depakote ER What should I tell my health care provider before I take this medicine? They need to know if you have any of these conditions:  if you often drink alcohol  kidney disease  liver disease  low platelet counts  mitochondrial disease  suicidal thoughts, plans, or attempt; a previous suicide attempt by you or a family member  urea cycle disorder (UCD)  an unusual or allergic reaction to divalproex sodium, sodium valproate, valproic acid, other medicines, foods, dyes, or preservatives  pregnant or trying to get pregnant  breast-feeding How should I use this medicine? Take this medicine by mouth with a drink of water. Follow the directions on the prescription label. Do not cut, crush or chew this medicine. You can take it with or without food. If it upsets your stomach, take it with food. Take your medicine at regular intervals. Do not take it more often than directed. Do not stop taking except on your doctor's advice. A special MedGuide will be given to you by the pharmacist with each prescription and refill. Be sure to read this information carefully each time. Talk to your pediatrician regarding the use of this medicine in children. While this drug may be prescribed for children as young as 10 years for selected conditions, precautions do apply. Overdosage: If you think you have taken too much of this medicine contact a poison control center or emergency room at once. NOTE: This medicine is only for you. Do not share this medicine with others. What if I miss a  dose? If you miss a dose, take it as soon as you can. If it is almost time for your next dose, take only that dose. Do not take double or extra doses. What may interact with this medicine? Do not take this medicine with any of the following medications:  sodium phenylbutyrate This medicine may also interact with the following medications:  aspirin  certain antibiotics like ertapenem, imipenem, meropenem  certain medicines for depression, anxiety, or psychotic disturbances  certain medicines for seizures like carbamazepine, clonazepam, diazepam, ethosuximide, felbamate, lamotrigine, phenobarbital, phenytoin, primidone, rufinamide, topiramate  certain medicines that treat or prevent blood clots like warfarin  cholestyramine  female hormones, like estrogens and birth control pills, patches, or rings  propofol  rifampin  ritonavir  tolbutamide  zidovudine This list may not describe all possible interactions. Give your health care provider a list of all the medicines, herbs, non-prescription drugs, or dietary supplements you use. Also tell them if you smoke, drink alcohol, or use illegal drugs. Some items may interact with your medicine. What should I watch for while using this medicine? Tell your doctor or health care provider if your symptoms do not get better or they start to get worse. This medicine may cause serious skin reactions. They can happen weeks to months after starting the medicine. Contact your health care provider right away if you notice fevers or flu-like symptoms with a rash. The rash may be red or purple and then turn into blisters or peeling of the skin. Or, you might notice a red rash with swelling of the   face, lips or lymph nodes in your neck or under your arms. Wear a medical ID bracelet or chain, and carry a card that describes your disease and details of your medicine and dosage times. You may get drowsy, dizzy, or have blurred vision. Do not drive, use  machinery, or do anything that needs mental alertness until you know how this medicine affects you. To reduce dizzy or fainting spells, do not sit or stand up quickly, especially if you are an older patient. Alcohol can increase drowsiness and dizziness. Avoid alcoholic drinks. This medicine can make you more sensitive to the sun. Keep out of the sun. If you cannot avoid being in the sun, wear protective clothing and use sunscreen. Do not use sun lamps or tanning beds/booths. Patients and their families should watch out for new or worsening depression or thoughts of suicide. Also watch out for sudden changes in feelings such as feeling anxious, agitated, panicky, irritable, hostile, aggressive, impulsive, severely restless, overly excited and hyperactive, or not being able to sleep. If this happens, especially at the beginning of treatment or after a change in dose, call your health care provider. Women should inform their doctor if they wish to become pregnant or think they might be pregnant. There is a potential for serious side effects to an unborn child. Talk to your health care provider or pharmacist for more information. Women who become pregnant while using this medicine may enroll in the [place] American Antiepileptic Drug Pregnancy Registry by calling 1-888-233-2334. This registry collects information about the safety of antiepileptic drug use during pregnancy. This medicine may cause a decrease in folic acid and vitamin D. You should make sure that you get enough vitamins while you are taking this medicine. Discuss the foods you eat and the vitamins you take with your health care provider. What side effects may I notice from receiving this medicine? Side effects that you should report to your doctor or health care professional as soon as possible:  allergic reactions like skin rash, itching or hives, swelling of the face, lips, or tongue  changes in vision  rash, fever, and swollen lymph  nodes  redness, blistering, peeling or loosening of the skin, including inside the mouth  signs and symptoms of liver injury like dark yellow or brown urine; general ill feeling or flu-like symptoms; light-colored stools; loss of appetite; nausea; right upper belly pain; unusually weak or tired; yellowing of the eyes or skin  suicidal thoughts or other mood changes  unusual bleeding or bruising Side effects that usually do not require medical attention (report to your doctor or health care professional if they continue or are bothersome):  constipation  diarrhea  dizziness  hair loss  headache  loss of appetite  weight gain This list may not describe all possible side effects. Call your doctor for medical advice about side effects. You may report side effects to FDA at 1-800-FDA-1088. Where should I keep my medicine? Keep out of reach of children. Store at room temperature between 15 and 30 degrees C (59 and 86 degrees F). Keep container tightly closed. Throw away any unused medicine after the expiration date. NOTE: This sheet is a summary. It may not cover all possible information. If you have questions about this medicine, talk to your doctor, pharmacist, or health care provider.  2020 Elsevier/Gold Standard (2018-09-26 16:03:42)  

## 2019-01-29 ENCOUNTER — Telehealth: Payer: Self-pay | Admitting: Neurology

## 2019-01-29 MED ORDER — DIVALPROEX SODIUM 500 MG PO DR TAB: DELAYED_RELEASE_TABLET | ORAL | 3 refills | Status: DC

## 2019-01-29 NOTE — Telephone Encounter (Signed)
I called pharmacy and spoke to pharmacist at [facility].  Pt paid out of pocket , so no over ride needed from pharmacy.  Are you ok to give another prescription for this?

## 2019-01-29 NOTE — Telephone Encounter (Signed)
I called the patient, the patient believes that the Depakote prescription was thrown out accidentally.  I will send another prescription.  The patient is using birth control, she has Mirena.

## 2019-01-29 NOTE — Telephone Encounter (Signed)
Pt called in and stated she has lost her divalproex (DEPAKOTE) 500 MG DR tablet and wants to know if she can get a script for more

## 2019-02-03 ENCOUNTER — Telehealth: Payer: Self-pay | Admitting: Neurology

## 2019-02-03 MED ORDER — DEXAMETHASONE 2 MG PO TABS: ORAL_TABLET | ORAL | 0 refills | Status: DC

## 2019-02-03 MED ORDER — DICLOFENAC POTASSIUM 50 MG PO TABS: 50.0000 mg | ORAL_TABLET | Freq: Two times a day (BID) | ORAL | 1 refills | Status: DC | PRN

## 2019-02-03 NOTE — Addendum Note (Signed)
Addended by: Suzzanne Cloud on: 02/03/2019 04:32 PM   Modules accepted: Orders

## 2019-02-03 NOTE — Telephone Encounter (Signed)
Spoke to pt.   She states has had migraines last 2 days.  Now level 6-7, w/ nausea, takes phernergan or zofran prn (she has rx's).  Is on depakote now.  She is keeping track of migraines.  Did have relief of migraine when last seen with decadron 01-20-19.  She wants to nip his one in bud so not to get worse.  Please advise.  HT [place].

## 2019-02-03 NOTE — Telephone Encounter (Addendum)
I called the patient. She started taking Depakote, but the bottle got thrown out. She has restarted it. She is on Depakote 500 mg daily.  She reports for the last several days she has had a migraine headache.  She was given Decadron 3-day taper 01/20/2019 and it was beneficial.  I will prescribe another Decadron 3-day taper 2 mg tablets.  I will also give her prescription for diclofenac potassium to take as needed for migraine headache. I sent her rx.

## 2019-02-03 NOTE — Telephone Encounter (Signed)
Pt has had a migraine for 2 days, she states Dr Jannifer Franklin generally calls in something for her to take.  Pt is asking something be called into [id number]

## 2019-03-14 ENCOUNTER — Emergency Department (HOSPITAL_COMMUNITY)
Admission: EM | Admit: 2019-03-14 | Discharge: 2019-03-15 | Disposition: A | Discharge status: Home / Self Care | Payer: Self-pay | Attending: Emergency Medicine | Admitting: Emergency Medicine

## 2019-03-14 ENCOUNTER — Emergency Department (HOSPITAL_COMMUNITY): Payer: Self-pay

## 2019-03-14 ENCOUNTER — Encounter (HOSPITAL_COMMUNITY): Payer: Self-pay | Admitting: Emergency Medicine

## 2019-03-14 ENCOUNTER — Other Ambulatory Visit: Payer: Self-pay

## 2019-03-14 DIAGNOSIS — R1013 Epigastric pain: Secondary | ICD-10-CM | POA: Insufficient documentation

## 2019-03-14 DIAGNOSIS — Z79899 Other long term (current) drug therapy: Secondary | ICD-10-CM | POA: Insufficient documentation

## 2019-03-14 DIAGNOSIS — R112 Nausea with vomiting, unspecified: Secondary | ICD-10-CM | POA: Insufficient documentation

## 2019-03-14 DIAGNOSIS — E109 Type 1 diabetes mellitus without complications: Secondary | ICD-10-CM | POA: Insufficient documentation

## 2019-03-14 LAB — COMPREHENSIVE METABOLIC PANEL
ALT: 13 U/L (ref 0–44)
AST: 11 U/L — ABNORMAL LOW (ref 15–41)
Albumin: 3.5 g/dL (ref 3.5–5.0)
Alkaline Phosphatase: 44 U/L (ref 38–126)
Anion gap: 10 (ref 5–15)
BUN: 12 mg/dL (ref 6–20)
CO2: 22 mmol/L (ref 22–32)
Calcium: 9.1 mg/dL (ref 8.9–10.3)
Chloride: 102 mmol/L (ref 98–111)
Creatinine, Ser: 0.65 mg/dL (ref 0.44–1.00)
GFR calc Af Amer: >60 mL/min (ref >60)
GFR calc non Af Amer: >60 mL/min (ref >60)
Glucose, Bld: 281 mg/dL — ABNORMAL HIGH (ref 70–99)
Potassium: 4.4 mmol/L (ref 3.5–5.1)
Sodium: 134 mmol/L — ABNORMAL LOW (ref 135–145)
Total Bilirubin: 0.6 mg/dL (ref 0.3–1.2)
Total Protein: 6.6 g/dL (ref 6.5–8.1)

## 2019-03-14 LAB — CBC WITH DIFFERENTIAL/PLATELET
Abs Immature Granulocytes: 0.01 10*3/uL (ref 0.00–0.07)
Basophils Absolute: 0 10*3/uL (ref 0.0–0.1)
Basophils Relative: 0 %
Eosinophils Absolute: 0.2 10*3/uL (ref 0.0–0.5)
Eosinophils Relative: 3 %
HCT: 39 % (ref 36.0–46.0)
Hemoglobin: 12.8 g/dL (ref 12.0–15.0)
Immature Granulocytes: 0 %
Lymphocytes Relative: 35 %
Lymphs Abs: 2.5 10*3/uL (ref 0.7–4.0)
MCH: 29.8 pg (ref 26.0–34.0)
MCHC: 32.8 g/dL (ref 30.0–36.0)
MCV: 90.7 fL (ref 80.0–100.0)
Monocytes Absolute: 0.3 10*3/uL (ref 0.1–1.0)
Monocytes Relative: 5 %
Neutro Abs: 4.1 10*3/uL (ref 1.7–7.7)
Neutrophils Relative %: 57 %
Platelets: 308 10*3/uL (ref 150–400)
RBC: 4.3 MIL/uL (ref 3.87–5.11)
RDW: 12.3 % (ref 11.5–15.5)
WBC: 7.2 10*3/uL (ref 4.0–10.5)
nRBC: 0 % (ref 0.0–0.2)

## 2019-03-14 LAB — I-STAT BETA HCG BLOOD, ED (NOT ORDERABLE): I-stat hCG, quantitative: <5 m[IU]/mL (ref <5)

## 2019-03-14 LAB — TROPONIN I (HIGH SENSITIVITY): Troponin I (High Sensitivity): <2 ng/L (ref <18)

## 2019-03-14 LAB — LIPASE, BLOOD: Lipase: 19 U/L (ref 11–51)

## 2019-03-14 MED ORDER — MORPHINE SULFATE (PF) 4 MG/ML IV SOLN
4.0000 mg | Freq: Once | INTRAVENOUS | Status: AC
  Administered 2019-03-14: 20:00:00 4 mg via INTRAMUSCULAR
  Filled 2019-03-14: qty 1

## 2019-03-14 MED ORDER — IOHEXOL 300 MG/ML  SOLN
100.0000 mL | Freq: Once | INTRAMUSCULAR | Status: AC | PRN
  Administered 2019-03-14: 100 mL via INTRAVENOUS

## 2019-03-14 MED ORDER — SODIUM CHLORIDE 0.9 % IV BOLUS
1000.0000 mL | Freq: Once | INTRAVENOUS | Status: AC
  Administered 2019-03-14: 1000 mL via INTRAVENOUS

## 2019-03-14 MED ORDER — HYDROMORPHONE HCL 1 MG/ML IJ SOLN
1.0000 mg | Freq: Once | INTRAMUSCULAR | Status: AC
  Administered 2019-03-14: 1 mg via INTRAMUSCULAR
  Filled 2019-03-14: qty 1

## 2019-03-14 MED ORDER — ONDANSETRON 4 MG PO TBDP
4.0000 mg | ORAL_TABLET | Freq: Once | ORAL | Status: AC
  Administered 2019-03-14: 20:00:00 4 mg via ORAL
  Filled 2019-03-14: qty 1

## 2019-03-14 NOTE — ED Notes (Signed)
IV team at bedside 

## 2019-03-14 NOTE — ED Notes (Signed)
Patient transported to CT 

## 2019-03-14 NOTE — ED Triage Notes (Signed)
Pt c/o for week having abd pains, vomiting, weakness, and chest pains. Pt repotrs taking Tums, Bean-o and meds to try to help with symptoms even Miralax to see if after BM would help. Reports today when vomited had "red in it".

## 2019-03-14 NOTE — ED Notes (Signed)
Patient refused lab draw at this time.  

## 2019-03-15 LAB — GLUCOSE, CAPILLARY: Glucose-Capillary: 255 mg/dL — ABNORMAL HIGH (ref 70–99)

## 2019-03-15 MED ORDER — METOCLOPRAMIDE HCL 5 MG/ML IJ SOLN
10.0000 mg | Freq: Once | INTRAMUSCULAR | Status: AC
  Administered 2019-03-15: 10 mg via INTRAVENOUS
  Filled 2019-03-15: qty 2

## 2019-03-15 MED ORDER — PANTOPRAZOLE SODIUM 20 MG PO TBEC: 20.0000 mg | DELAYED_RELEASE_TABLET | Freq: Every day | ORAL | 0 refills | Status: DC

## 2019-03-15 MED ORDER — INSULIN ASPART 100 UNIT/ML ~~LOC~~ SOLN
5.0000 [IU] | Freq: Once | SUBCUTANEOUS | Status: AC
  Administered 2019-03-15: 5 [IU] via SUBCUTANEOUS
  Filled 2019-03-15: qty 0.05

## 2019-03-15 MED ORDER — METOCLOPRAMIDE HCL 10 MG PO TABS: 10.0000 mg | ORAL_TABLET | Freq: Four times a day (QID) | ORAL | 0 refills | Status: DC

## 2019-03-15 MED ORDER — HYDROMORPHONE HCL 1 MG/ML IJ SOLN
0.5000 mg | Freq: Once | INTRAMUSCULAR | Status: AC
  Administered 2019-03-15: 0.5 mg via INTRAVENOUS
  Filled 2019-03-15: qty 1

## 2019-03-15 MED ORDER — OXYCODONE-ACETAMINOPHEN 5-325 MG PO TABS: 1.0000 | ORAL_TABLET | Freq: Four times a day (QID) | ORAL | 0 refills | Status: DC | PRN

## 2019-03-15 NOTE — ED Provider Notes (Signed)
01:00: Assumed care of patient from Armstead Peaks PA-C at change of shift pending PO challenge.  Please see prior provider note for full H&P.  01:45: Patient tolerating PO. Appears appropriate for discharge home. DC instructions prepared by prior provider.    Amaryllis Dyke, PA-C 03/15/19 0153    Orpah Greek, MD 03/15/19 [phone]

## 2019-03-15 NOTE — Discharge Instructions (Addendum)
Take Protonix daily as prescribed.  Take Reglan every 6 hours as needed for nausea or vomiting.  Take 1-2 Percocet every 6 hours as needed for severe pain.  Do not drive peripheral machinery while taking this medication.  Please follow-up with your doctor closely in the next few days.  Please return to the emergency department if you develop any new or worsening symptoms including intractable vomiting, severe worsening pain, or any other concerning symptoms.  Do not drink alcohol, drive, operate machinery or participate in any other potentially dangerous activities while taking opiate pain medication as it may make you sleepy. Do not take this medication with any other sedating medications, either prescription or over-the-counter. If you were prescribed Percocet or Vicodin, do not take these with acetaminophen (Tylenol) as it is already contained within these medications and overdose of Tylenol is dangerous.   This medication is an opiate (or narcotic) pain medication and can be habit forming.  Use it as little as possible to achieve adequate pain control.  Do not use or use it with extreme caution if you have a history of opiate abuse or dependence. This medication is intended for your use only - do not give any to anyone else and keep it in a secure place where nobody else, especially children, have access to it. It will also cause or worsen constipation, so you may want to consider taking an over-the-counter stool softener while you are taking this medication.

## 2019-03-15 NOTE — ED Provider Notes (Signed)
[facility] DEPT Provider Note   CSN: [id number] Arrival date & time: 03/14/19  1617     History   Chief Complaint Chief Complaint  Patient presents with   Emesis   Abdominal Pain   Chest Pain    HPI [name] is a 43 y.o. female with history of type 1 diabetes, depression who presents with epigastric pain that began over the past few days.  Patient has had associated vomiting, including 2 episodes of emesis with a little bit of red in it today, but it has not recurred.  She has had generalized weakness she is also had pain radiating into her chest.  She has tried Tums, Beano, MiraLAX without relief.  She denies any fever, shortness of breath, urinary symptoms, back pain, abnormal vaginal bleeding or discharge.  Patient states she has had GERD in the past, but has not needed to take any medication recently.     HPI  Past Medical History:  Diagnosis Date   Adopted    Cluster headache syndrome, intractable    Depression    Migraine without aura, with intractable migraine, so stated, without mention of status migrainosus 12/05/2012   Ovarian cyst    Sepsis ([place])    STD (sexually transmitted disease)    HSV II    Type I diabetes mellitus ([place])     Patient Active Problem List   Diagnosis Date Noted   Dehydration 09/17/2018   Gastroesophageal reflux disease    ADHD    Volume depletion 07/21/2018   Diabetic ketoacidosis ([place]) 07/21/2018   Morbid obesity ([place]) 06/16/2018   Chronic tension-type headache, intractable 06/03/2018   Multiple thyroid nodules 04/17/2018   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 ([place]) 01/01/2018   DKA, type 1 ([place]) 02/14/2015   DKA (diabetic ketoacidoses) ([place]) 02/14/2015   Thyroid nodule 02/14/2015   Depression with anxiety 02/14/2015   Acute kidney injury ([place]) 02/14/2015   Leukocytosis 02/14/2015   Increased anion gap metabolic acidosis 69/45/0388   History of frequent  urinary tract infections 01/25/2014   Anxiety 12/04/2013   Neuropathic pain 12/04/2013   Recurrent UTI 12/04/2013   Aphakia of left eye 06/23/2013   Retinal detachment 06/23/2013   PDR (proliferative diabetic retinopathy) ([place]) 06/23/2013   Recurrent major depression-severe ([place]) 01/06/2013   Generalized anxiety disorder 01/06/2013   Common migraine with intractable migraine 12/05/2012   IDDM (insulin dependent diabetes mellitus) ([place]) 12/05/2012   RLQ abdominal pain 11/30/2011    Past Surgical History:  Procedure Laterality Date   APPENDECTOMY     BUNIONECTOMY Bilateral    CHOLECYSTECTOMY     EYE SURGERY     OVARIAN CYST REMOVAL       OB History    Gravida  0   Para  0   Term  0   Preterm  0   AB  0   Living  0     SAB  0   TAB  0   Ectopic  0   Multiple  0   Live Births  0            Home Medications    Prior to Admission medications   Medication Sig Start Date End Date Taking? Authorizing Provider  amphetamine-dextroamphetamine (ADDERALL) 20 MG tablet Take 20 mg by mouth 3 (three) times daily.  10/24/17  Yes [provider]  clonazePAM (KLONOPIN) 0.5 MG tablet Take 0.5 mg by mouth 3 (three) times daily as needed for anxiety. For  anxiety 01/09/13  Yes Niel Hummer, NP  dexamethasone (DECADRON) 2 MG tablet Start taking 3 tablets on day 1, then take 2 tablets, then take 1 Patient taking differently: Take 2 mg by mouth See admin instructions. Start taking 3 tablets on day 1, then take 2 tablets, then take 1 02/03/19  Yes Suzzanne Cloud, NP  diclofenac (CATAFLAM) 50 MG tablet Take 1 tablet (50 mg total) by mouth 2 (two) times daily as needed. Patient taking differently: Take 50 mg by mouth 2 (two) times daily as needed (migraines).  02/03/19  Yes Suzzanne Cloud, NP  divalproex (DEPAKOTE) 500 MG DR tablet Start taking 1 tablet at bedtime x 3 weeks, if tolerating can increase to 1 tablet twice a day Patient taking differently: Take 500  mg by mouth 2 (two) times daily as needed (migraines).  01/29/19  Yes Kathrynn Ducking, MD  DULoxetine (CYMBALTA) 60 MG capsule Take 120 mg by mouth daily.   Yes [provider]  gabapentin (NEURONTIN) 300 MG capsule Take 600 mg by mouth 3 (three) times daily as needed (anxiety).  08/27/18  Yes [provider]  ibuprofen (ADVIL,MOTRIN) 800 MG tablet Take 1 tablet (800 mg total) by mouth every 8 (eight) hours as needed. Patient taking differently: Take 800 mg by mouth every 8 (eight) hours as needed for moderate pain.  08/25/18  Yes Salvadore Dom, MD  Insulin Aspart, w/Niacinamide, (FIASP FLEXTOUCH) 100 UNIT/ML SOPN Inject 1-8 Units into the skin 3 (three) times daily. Patient taking differently: Inject 1-12 Units into the skin 3 (three) times daily. Per sliding scale, 1 unit for 8 grams of carbs 09/19/18  Yes Eugenie Filler, MD  insulin degludec (TRESIBA) 100 UNIT/ML SOPN FlexTouch Pen Inject 23 Units into the skin at bedtime. 09/19/18  Yes [provider]  levonorgestrel (MIRENA) 20 MCG/24HR IUD 1 each by Intrauterine route once. Placed 06/2014   Yes [provider]  Multiple Vitamins-Minerals (MULTIVITAMIN GUMMIES WOMENS) CHEW Chew 1 each by mouth daily.   Yes [provider]  REXULTI 2 MG TABS Take 2 mg by mouth every other day.  08/02/18  Yes [provider]  solifenacin (VESICARE) 10 MG tablet Take 10 mg by mouth daily.   Yes [provider]  traZODone (DESYREL) 150 MG tablet Take 150 mg by mouth at bedtime as needed for sleep.  08/29/18  Yes [provider]  zolpidem (AMBIEN) 10 MG tablet Take 10 mg by mouth at bedtime.    Yes [provider]  blood glucose meter kit and supplies KIT Dispense based on patient and insurance preference. Use up to four times daily as directed. (FOR ICD-9 250.00, 250.01). 07/23/18   Kayleen Memos, DO  Insulin Pen Needle 31G X 5 MM MISC 1-8 Units by Does not apply route 3 (three)  times daily. 09/19/18   Eugenie Filler, MD  metoCLOPramide (REGLAN) 10 MG tablet Take 1 tablet (10 mg total) by mouth every 6 (six) hours. 03/15/19   Analayah Brooke, Bea Graff, PA-C  ondansetron (ZOFRAN) 4 MG tablet Take 1 tablet (4 mg total) by mouth 4 (four) times daily as needed for nausea or vomiting. Patient not taking: Reported on 03/14/2019 12/18/18   Margarita Mail, PA-C  oxyCODONE-acetaminophen (PERCOCET/ROXICET) 5-325 MG tablet Take 1-2 tablets by mouth every 6 (six) hours as needed for severe pain. 03/15/19   Isais Klipfel, Bea Graff, PA-C  pantoprazole (PROTONIX) 20 MG tablet Take 1 tablet (20 mg total) by mouth daily. 03/15/19  Renia Mikelson M, PA-C  dicyclomine (BENTYL) 20 MG tablet Take 1 tablet (20 mg total) by mouth 2 (two) times daily. Patient not taking: Reported on 12/20/2018 04/01/18 12/20/18  Drenda Freeze, MD  insulin aspart (NOVOLOG) 100 UNIT/ML injection Inject 0-9 Units into the skin 3 (three) times daily with meals. Patient not taking: Reported on 12/20/2018 09/19/18 12/20/18  Eugenie Filler, MD  Insulin Glargine [facility]) 100 UNIT/ML SOPN Inject 0.22 mLs (22 Units total) into the skin daily. Patient not taking: Reported on 12/20/2018 09/19/18 12/20/18  Eugenie Filler, MD  misoprostol (CYTOTEC) 200 MCG tablet Place 2 tablets vaginally 6-12 hours prior to IUD insertion Patient not taking: Reported on 12/20/2018 08/25/18 12/20/18  Salvadore Dom, MD    Family History Family History  Adopted: Yes  Problem Relation Age of Onset   Diabetes Father     Social History Social History   Tobacco Use   Smoking status: Never Smoker   Smokeless tobacco: Never Used  Substance Use Topics   Alcohol use: Yes    Comment: seldom   Drug use: No     Allergies   Sulfa antibiotics, Wellbutrin [bupropion], Amoxicillin, and Codeine   Review of Systems Review of Systems  Constitutional: Positive for fatigue. Negative for chills and fever.  HENT: Negative for facial  swelling and sore throat.   Respiratory: Negative for shortness of breath.   Cardiovascular: Positive for chest pain.  Gastrointestinal: Positive for abdominal pain and vomiting. Negative for diarrhea and nausea.  Genitourinary: Negative for dysuria.  Musculoskeletal: Negative for back pain.  Skin: Negative for rash and wound.  Neurological: Negative for headaches.  Psychiatric/Behavioral: The patient is not nervous/anxious.      Physical Exam Updated Vital Signs BP 100/69    Pulse 86    Temp 99.5 F (37.5 C) (Oral)    Resp 16    SpO2 97%   Physical Exam Vitals signs and nursing note reviewed.  Constitutional:      General: She is not in acute distress.    Appearance: She is well-developed. She is not diaphoretic.  HENT:     Head: Normocephalic and atraumatic.     Mouth/Throat:     Pharynx: No oropharyngeal exudate.  Eyes:     General: No scleral icterus.       Right eye: No discharge.        Left eye: No discharge.     Conjunctiva/sclera: Conjunctivae normal.     Pupils: Pupils are equal, round, and reactive to light.  Neck:     Musculoskeletal: Normal range of motion and neck supple.     Thyroid: No thyromegaly.  Cardiovascular:     Rate and Rhythm: Normal rate and regular rhythm.     Heart sounds: Normal heart sounds. No murmur. No friction rub. No gallop.   Pulmonary:     Effort: Pulmonary effort is normal. No respiratory distress.     Breath sounds: Normal breath sounds. No stridor. No wheezing or rales.  Abdominal:     General: Bowel sounds are normal. There is no distension.     Palpations: Abdomen is soft.     Tenderness: There is generalized abdominal tenderness and tenderness in the epigastric area. There is no right CVA tenderness, left CVA tenderness, guarding or rebound.  Lymphadenopathy:     Cervical: No cervical adenopathy.  Skin:    General: Skin is warm and dry.     Coloration: Skin is not pale.  Findings: No rash.  Neurological:     Mental  Status: She is alert.     Coordination: Coordination normal.      ED Treatments / Results  Labs (all labs ordered are listed, but only abnormal results are displayed) Labs Reviewed  COMPREHENSIVE METABOLIC PANEL - Abnormal; Notable for the following components:      Result Value   Sodium 134 (*)    Glucose, Bld 281 (*)    AST 11 (*)    All other components within normal limits  GLUCOSE, CAPILLARY - Abnormal; Notable for the following components:   Glucose-Capillary 255 (*)    All other components within normal limits  LIPASE, BLOOD  CBC WITH DIFFERENTIAL/PLATELET  I-STAT BETA HCG BLOOD, ED ([facility], [facility], [facility] ONLY)  I-STAT BETA HCG BLOOD, ED (NOT ORDERABLE)  TROPONIN I (HIGH SENSITIVITY)    EKG EKG Interpretation  Date/Time:  Saturday March 14 2019 16:35:03 EDT Ventricular Rate:  117 PR Interval:    QRS Duration: 69 QT Interval:  305 QTC Calculation: 426 R Axis:   65 Text Interpretation:  Sinus tachycardia Low voltage, precordial leads Borderline T abnormalities, diffuse leads No significant change since last tracing Confirmed by Blanchie Dessert [phone]) on 03/14/2019 7:52:37 PM   Radiology Dg Chest 2 View  Result Date: 03/14/2019 CLINICAL DATA:  Chest pain EXAM: CHEST - 2 VIEW COMPARISON:  09/17/2018 FINDINGS: The lungs are clear without focal pneumonia, edema, pneumothorax or pleural effusion. The cardiopericardial silhouette is within normal limits for size. The visualized bony structures of the thorax are intact. Telemetry leads overlie the chest. IMPRESSION: No active cardiopulmonary disease. Electronically Signed   By: Misty Stanley M.D.   On: 03/14/2019 17:09   Ct Abdomen Pelvis W Contrast  Result Date: 03/15/2019 CLINICAL DATA:  Abdominal pain with weakness and vomiting EXAM: CT ABDOMEN AND PELVIS WITH CONTRAST TECHNIQUE: Multidetector CT imaging of the abdomen and pelvis was performed using the standard protocol following bolus administration of intravenous  contrast. CONTRAST:  138m OMNIPAQUE IOHEXOL 300 MG/ML  SOLN COMPARISON:  12/18/2018 CT abdomen pelvis FINDINGS: LOWER CHEST: There is no basilar pleural or apical pericardial effusion. HEPATOBILIARY: The hepatic contours and density are normal. There is no intra- or extrahepatic biliary dilatation. The gallbladder is normal. PANCREAS: The pancreatic parenchymal contours are normal and there is no ductal dilatation. There is no peripancreatic fluid collection. SPLEEN: Normal. ADRENALS/URINARY TRACT: --Adrenal glands: Normal. --Right kidney/ureter: No hydronephrosis, nephroureterolithiasis, perinephric stranding or solid renal mass. --Left kidney/ureter: No hydronephrosis, nephroureterolithiasis, perinephric stranding or solid renal mass. --Urinary bladder: Normal for degree of distention STOMACH/BOWEL: --Stomach/Duodenum: There is no hiatal hernia or other gastric abnormality. The duodenal course and caliber are normal. --Small bowel: No dilatation or inflammation. --Colon: No focal abnormality. --Appendix: Surgically absent. VASCULAR/LYMPHATIC: Normal course and caliber of the major abdominal vessels. No abdominal or pelvic lymphadenopathy. REPRODUCTIVE: There is a T-shaped contraceptive device within the uterus. MUSCULOSKELETAL. No bony spinal canal stenosis or focal osseous abnormality. OTHER: None. IMPRESSION: No acute abdominal or pelvic abnormality. Electronically Signed   By: KUlyses JarredM.D.   On: 03/15/2019 00:22    Procedures Procedures (including critical care time)  Medications Ordered in ED Medications  sodium chloride 0.9 % bolus 1,000 mL (0 mLs Intravenous Stopped 03/15/19 0052)  morphine 4 MG/ML injection 4 mg (4 mg Intramuscular Given 03/14/19 1932)  ondansetron (ZOFRAN-ODT) disintegrating tablet 4 mg (4 mg Oral Given 03/14/19 1934)  HYDROmorphone (DILAUDID) injection 1 mg (1 mg Intramuscular Given 03/14/19 2043)  iohexol (OMNIPAQUE) 300 MG/ML solution 100 mL (100 mLs Intravenous Contrast  Given 03/14/19 2355)  insulin aspart (novoLOG) injection 5 Units (5 Units Subcutaneous Given 03/15/19 0052)  metoCLOPramide (REGLAN) injection 10 mg (10 mg Intravenous Given 03/15/19 0053)  HYDROmorphone (DILAUDID) injection 0.5 mg (0.5 mg Intravenous Given 03/15/19 0054)     Initial Impression / Assessment and Plan / ED Course  I have reviewed the triage vital signs and the nursing notes.  Pertinent labs & imaging results that were available during my care of the patient were reviewed by me and considered in my medical decision making (see chart for details).        Patient presenting with epigastric pain and some radiation into her chest.  Labs are unremarkable except for elevated glucose.  No signs of DKA.  Troponin is negative.  There was a delay in getting labs and replacing IV, so patient initially given IM medications.  CT was from delayed, but is negative for acute findings.  Suspect an ulcer versus gastroparesis.  Patient tolerating oral fluids prior to discharge.  Will treat with Protonix, Reglan, and short course of Percocet for pain control.  Close follow-up with PCP recommended as well as GI.  Return precautions discussed.  Patient understands and agrees with plan.  Patient vitals stable throughout ED course and discharged in satisfactory condition.  Final Clinical Impressions(s) / ED Diagnoses   Final diagnoses:  Epigastric pain  Non-intractable vomiting with nausea, unspecified vomiting type    ED Discharge Orders         Ordered    pantoprazole (PROTONIX) 20 MG tablet  Daily     03/15/19 0111    oxyCODONE-acetaminophen (PERCOCET/ROXICET) 5-325 MG tablet  Every 6 hours PRN     03/15/19 0111    metoCLOPramide (REGLAN) 10 MG tablet  Every 6 hours     03/15/19 0111           Frederica Kuster, PA-C 03/15/19 1517    Orpah Greek, MD 03/15/19 [phone]

## 2019-03-15 NOTE — ED Notes (Signed)
Pt was able to tolerate fluids and crackers without any N/V.

## 2019-05-11 DIAGNOSIS — K279 Peptic ulcer, site unspecified, unspecified as acute or chronic, without hemorrhage or perforation: Secondary | ICD-10-CM | POA: Insufficient documentation

## 2019-05-27 ENCOUNTER — Ambulatory Visit: Payer: Self-pay | Admitting: Neurology

## 2019-09-30 NOTE — Progress Notes (Deleted)
PATIENT: [name] DOB: 1976/06/21  REASON FOR VISIT: follow up HISTORY FROM: patient  HISTORY OF PRESENT ILLNESS: Today 09/30/19  [name] is a 44 year old female with history of intractable migraine headache.  She has previously been on a multitude of medications for the headaches including Imitrex, gabapentin, Topamax, Flexeril, Seroquel, Phenergan, Toradol, naproxen, lithium, propanolol, Effexor, amitriptyline, Fioricet, Ajovy, Cymbalta. She is taking aspirin 81 mg due to previous reported neurological symptoms with her headaches.  She is a type I diabetic. HISTORY  01/20/2019 SS: [name] is a 44 year old female with history of intractable migraine headache.  She was in the ER 01/15/2019 for migraine headache, she was given IV Depakote and Phenergan.  She was sent home with a 3-day prescription of Thorazine.  She was seen in the office 11/21/2017,  11/29/2017, 12/17/2017, 06/04/2018, 10/28/2018 for Depcon injection. she missed her revisit in January 2020. She has been on Ajovy in the past for migraine prevention. She has been on a multitude of medications in the past including Imitrex, gabapentin, Topamax, Flexeril, Seroquel, [employer], Phenergan, Toradol, naproxen, lithium, propranolol, Effexor, amitriptyline, and Fioricet. She has recently been on Cymbalta.  She presents today for follow-up unaccompanied.  Unfortunately, she has lost her health insurance.  She is not on any medications for migraine prevention. She is no longer taking Ajovy.  She reports her typical migraine as occurring in the right parietal area.  She reports she may have 1-2 migraines a month, but they may last 1 to 2 weeks each.  She describes a new event that occurred in May 2020, prompting ER visit.  She says she developed slurred speech, difficulty ambulating, but a few hours later developed a migraine headache.  She went to the ER and was diagnosed with a complicated migraine, had MRI of the brain 11/25/2018  that was normal.  She says the event reoccurred a few nights ago, but was not as significant.  She is a type I diabetic.  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  She says she has had a constant migraine for the last 8 days, that is 5/10 on pain scale.  She has finished her course of Thorazine.   REVIEW OF SYSTEMS: Out of a complete 14 system review of symptoms, the patient complains only of the following symptoms, and all other reviewed systems are negative.  ALLERGIES: Allergies  Allergen Reactions  . Sulfa Antibiotics Nausea And Vomiting  . Wellbutrin [Bupropion] Other (See Comments)    "Makes me suicidal."  . Amoxicillin Rash    Has patient had a PCN reaction causing immediate rash, facial/tongue/throat swelling, SOB or lightheadedness with hypotension: Yes Has patient had a PCN reaction causing severe rash involving mucus membranes or skin necrosis: No Has patient had a PCN reaction that required hospitalization: No Has patient had a PCN reaction occurring within the last 10 years: No If all of the above answers are "NO", then may proceed with Cephalosporin use.   . Codeine Rash    Cannot take just codeine. Able to tolerate derivatives such as hydrocodone    HOME MEDICATIONS: Outpatient Medications Prior to Visit  Medication Sig Dispense Refill  . amphetamine-dextroamphetamine (ADDERALL) 20 MG tablet Take 20 mg by mouth 3 (three) times daily.     . blood glucose meter kit and supplies KIT Dispense based on patient and insurance preference. Use up to four times daily as directed. (FOR ICD-9 250.00, 250.01). 1 each 0  . clonazePAM (KLONOPIN) 0.5  MG tablet Take 0.5 mg by mouth 3 (three) times daily as needed for anxiety. For anxiety    . dexamethasone (DECADRON) 2 MG tablet Start taking 3 tablets on day 1, then take 2 tablets, then take 1 (Patient taking differently: Take 2 mg by mouth See admin instructions. Start taking 3 tablets on day 1, then take 2  tablets, then take 1) 6 tablet 0  . diclofenac (CATAFLAM) 50 MG tablet Take 1 tablet (50 mg total) by mouth 2 (two) times daily as needed. (Patient taking differently: Take 50 mg by mouth 2 (two) times daily as needed (migraines). ) 30 tablet 1  . divalproex (DEPAKOTE) 500 MG DR tablet Start taking 1 tablet at bedtime x 3 weeks, if tolerating can increase to 1 tablet twice a day (Patient taking differently: Take 500 mg by mouth 2 (two) times daily as needed (migraines). ) 60 tablet 3  . DULoxetine (CYMBALTA) 60 MG capsule Take 120 mg by mouth daily.    Marland Kitchen gabapentin (NEURONTIN) 300 MG capsule Take 600 mg by mouth 3 (three) times daily as needed (anxiety).     Marland Kitchen ibuprofen (ADVIL,MOTRIN) 800 MG tablet Take 1 tablet (800 mg total) by mouth every 8 (eight) hours as needed. (Patient taking differently: Take 800 mg by mouth every 8 (eight) hours as needed for moderate pain. ) 30 tablet 1  . Insulin Aspart, w/Niacinamide, (FIASP FLEXTOUCH) 100 UNIT/ML SOPN Inject 1-8 Units into the skin 3 (three) times daily. (Patient taking differently: Inject 1-12 Units into the skin 3 (three) times daily. Per sliding scale, 1 unit for 8 grams of carbs) 5 pen 0  . insulin degludec (TRESIBA) 100 UNIT/ML SOPN FlexTouch Pen Inject 23 Units into the skin at bedtime.    . Insulin Pen Needle 31G X 5 MM MISC 1-8 Units by Does not apply route 3 (three) times daily. 100 each 0  . levonorgestrel (MIRENA) 20 MCG/24HR IUD 1 each by Intrauterine route once. Placed 06/2014    . metoCLOPramide (REGLAN) 10 MG tablet Take 1 tablet (10 mg total) by mouth every 6 (six) hours. 30 tablet 0  . Multiple Vitamins-Minerals (MULTIVITAMIN GUMMIES WOMENS) CHEW Chew 1 each by mouth daily.    . ondansetron (ZOFRAN) 4 MG tablet Take 1 tablet (4 mg total) by mouth 4 (four) times daily as needed for nausea or vomiting. (Patient not taking: Reported on 03/14/2019) 10 tablet 0  . oxyCODONE-acetaminophen (PERCOCET/ROXICET) 5-325 MG tablet Take 1-2 tablets by  mouth every 6 (six) hours as needed for severe pain. 15 tablet 0  . pantoprazole (PROTONIX) 20 MG tablet Take 1 tablet (20 mg total) by mouth daily. 30 tablet 0  . REXULTI 2 MG TABS Take 2 mg by mouth every other day.     . solifenacin (VESICARE) 10 MG tablet Take 10 mg by mouth daily.    . traZODone (DESYREL) 150 MG tablet Take 150 mg by mouth at bedtime as needed for sleep.     Marland Kitchen zolpidem (AMBIEN) 10 MG tablet Take 10 mg by mouth at bedtime.      No facility-administered medications prior to visit.    PAST MEDICAL HISTORY: Past Medical History:  Diagnosis Date  . Adopted   . Cluster headache syndrome, intractable   . Depression   . Migraine without aura, with intractable migraine, so stated, without mention of status migrainosus 12/05/2012  . Ovarian cyst   . Sepsis ([place])   . STD (sexually transmitted disease)    HSV II   .  Type I diabetes mellitus ([place])     PAST SURGICAL HISTORY: Past Surgical History:  Procedure Laterality Date  . APPENDECTOMY    . BUNIONECTOMY Bilateral   . CHOLECYSTECTOMY    . EYE SURGERY    . OVARIAN CYST REMOVAL      FAMILY HISTORY: Family History  Adopted: Yes  Problem Relation Age of Onset  . Diabetes Father     SOCIAL HISTORY: Social History   Socioeconomic History  . Marital status: Single    Spouse name: Not on file  . Number of children: 0  . Years of education: Not on file  . Highest education level: Some college, no degree  Occupational History    Employer: [employer]  Tobacco Use  . Smoking status: Never Smoker  . Smokeless tobacco: Never Used  Substance and Sexual Activity  . Alcohol use: Yes    Comment: seldom  . Drug use: No  . Sexual activity: Yes    Partners: Male    Birth control/protection: I.U.D.  Other Topics Concern  . Not on file  Social History Narrative   Patient works at [employer] and lives alone.    She may stay with friends at times.   She has no children and a college education.   Some tea and  soda   Right handed    Social Determinants of Health   Financial Resource Strain:   . Difficulty of Paying Living Expenses:   Food Insecurity:   . Worried About Charity fundraiser in the Last Year:   . Arboriculturist in the Last Year:   Transportation Needs:   . Film/video editor (Medical):   Marland Kitchen Lack of Transportation (Non-Medical):   Physical Activity:   . Days of Exercise per Week:   . Minutes of Exercise per Session:   Stress:   . Feeling of Stress :   Social Connections:   . Frequency of Communication with Friends and Family:   . Frequency of Social Gatherings with Friends and Family:   . Attends Religious Services:   . Active Member of Clubs or Organizations:   . Attends Archivist Meetings:   Marland Kitchen Marital Status:   Intimate Partner Violence:   . Fear of Current or Ex-Partner:   . Emotionally Abused:   Marland Kitchen Physically Abused:   . Sexually Abused:       PHYSICAL EXAM  There were no vitals filed for this visit. There is no height or weight on file to calculate BMI.  Generalized: Well developed, in no acute distress   Neurological examination  Mentation: Alert oriented to time, place, history taking. Follows all commands speech and language fluent Cranial nerve II-XII: Pupils were equal round reactive to light. Extraocular movements were full, visual field were full on confrontational test. Facial sensation and strength were normal. Uvula tongue midline. Head turning and shoulder shrug  were normal and symmetric. Motor: The motor testing reveals 5 over 5 strength of all 4 extremities. Good symmetric motor tone is noted throughout.  Sensory: Sensory testing is intact to soft touch on all 4 extremities. No evidence of extinction is noted.  Coordination: Cerebellar testing reveals good finger-nose-finger and heel-to-shin bilaterally.  Gait and station: Gait is normal. Tandem gait is normal. Romberg is negative. No drift is seen.  Reflexes: Deep tendon reflexes  are symmetric and normal bilaterally.   DIAGNOSTIC DATA (LABS, IMAGING, TESTING) - I reviewed patient records, labs, notes, testing and imaging myself where available.  Lab Results  Component Value Date   WBC 7.2 03/14/2019   HGB 12.8 03/14/2019   HCT 39.0 03/14/2019   MCV 90.7 03/14/2019   PLT 308 03/14/2019      Component Value Date/Time   NA 134 (L) 03/14/2019 2226   NA 133 (L) 11/24/2013 1449   K 4.4 03/14/2019 2226   K 4.3 11/24/2013 1449   CL 102 03/14/2019 2226   CL 103 11/24/2013 1449   CO2 22 03/14/2019 2226   CO2 24 11/24/2013 1449   GLUCOSE 281 (H) 03/14/2019 2226   GLUCOSE 228 (H) 11/24/2013 1449   BUN 12 03/14/2019 2226   BUN 8 11/24/2013 1449   CREATININE 0.65 03/14/2019 2226   CREATININE 0.90 11/24/2013 1449   CALCIUM 9.1 03/14/2019 2226   CALCIUM 9.3 11/24/2013 1449   PROT 6.6 03/14/2019 2226   PROT 7.1 11/10/2013 0033   ALBUMIN 3.5 03/14/2019 2226   ALBUMIN 2.9 (L) 11/10/2013 0033   AST 11 (L) 03/14/2019 2226   AST 20 11/10/2013 0033   ALT 13 03/14/2019 2226   ALT 11 (L) 11/10/2013 0033   ALKPHOS 44 03/14/2019 2226   ALKPHOS 76 11/10/2013 0033   BILITOT 0.6 03/14/2019 2226   BILITOT 0.5 11/10/2013 0033   GFRNONAA >60 03/14/2019 2226   GFRNONAA >60 11/24/2013 1449   GFRAA >60 03/14/2019 2226   GFRAA >60 11/24/2013 1449   No results found for: CHOL, HDL, LDLCALC, LDLDIRECT, TRIG, CHOLHDL Lab Results  Component Value Date   HGBA1C 7.5 (H) 09/17/2018   Lab Results  Component Value Date   VITAMINB12 406 09/18/2018   No results found for: TSH    ASSESSMENT AND PLAN 44 y.o. year old female  has a past medical history of Adopted, Cluster headache syndrome, intractable, Depression, Migraine without aura, with intractable migraine, so stated, without mention of status migrainosus (12/05/2012), Ovarian cyst, Sepsis ([place]), STD (sexually transmitted disease), and Type I diabetes mellitus ([place]). here with ***   I spent 15 minutes with the patient. 50%  of this time was spent   Butler Denmark, [place], DNP 09/30/2019, 3:33 PM [facility] [address] [place], [place] [address] [phone]

## 2019-10-01 ENCOUNTER — Ambulatory Visit: Payer: Self-pay | Admitting: Neurology

## 2019-10-01 ENCOUNTER — Telehealth: Payer: Self-pay | Admitting: *Deleted

## 2019-10-01 NOTE — Telephone Encounter (Signed)
Patient was no show for follow up with NP today.  

## 2019-10-08 ENCOUNTER — Encounter: Payer: Self-pay | Admitting: Neurology

## 2019-10-08 ENCOUNTER — Other Ambulatory Visit: Payer: Self-pay

## 2019-10-08 ENCOUNTER — Ambulatory Visit: Payer: BC Managed Care – PPO | Admitting: Neurology

## 2019-10-08 VITALS — BP 144/92 | HR 105 | Temp 97.7°F | Ht 61.0 in | Wt 159.0 lb

## 2019-10-08 DIAGNOSIS — G43019 Migraine without aura, intractable, without status migrainosus: Secondary | ICD-10-CM

## 2019-10-08 MED ORDER — ONDANSETRON HCL 4 MG PO TABS: 4.0000 mg | ORAL_TABLET | Freq: Four times a day (QID) | ORAL | 3 refills | Status: DC | PRN

## 2019-10-08 MED ORDER — UBRELVY 100 MG PO TABS: 100.0000 mg | ORAL_TABLET | ORAL | 11 refills | Status: DC | PRN

## 2019-10-08 MED ORDER — AJOVY 225 MG/1.5ML ~~LOC~~ SOAJ: 225.0000 mg | SUBCUTANEOUS | 11 refills | Status: DC

## 2019-10-08 NOTE — Progress Notes (Signed)
PATIENT: [name] DOB: 1976/02/10  REASON FOR VISIT: follow up HISTORY FROM: patient  HISTORY OF PRESENT ILLNESS: Today 10/08/19  [name] is a 44 year old female with history of intractable migraine headache.  She was previously evaluated in the ER for a complicated migraine, with neurological symptoms.  She is to be taking aspirin 81 mg daily as result.  She is a type I diabetic.  She is no longer taking Depakote.  For about 6 months, she had no headache, as result of less stressed not working.  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  Her headaches have returned.  She is having about 10 migraines a month.  Headaches are usually right-sided, parietal, frontal area.  Photophobia, nausea.  No further stroke like symptoms of migraine.  She sees a psychiatrist. She has been on Ajovy in the past for migraine prevention. She has been on a multitude of medications in the past including Imitrex, gabapentin, Topamax, Flexeril, Seroquel, D.H.E. 45, Phenergan, Toradol, naproxen, lithium, propranolol, Effexor, Cymbalta, amitriptyline, and Fioricet. She says her overall health has been good.  Presents today for evaluation unaccompanied.  HISTORY  01/20/2019 SS: [name] is a 44 year old female with history of intractable migraine headache.  She was in the ER 01/15/2019 for migraine headache, she was given IV Depakote and Phenergan.  She was sent home with a 3-day prescription of Thorazine.  She was seen in the office 11/21/2017,  11/29/2017, 12/17/2017, 06/04/2018, 10/28/2018 for Depcon injection. she missed her revisit in January 2020. She has been on Ajovy in the past for migraine prevention. She has been on a multitude of medications in the past including Imitrex, gabapentin, Topamax, Flexeril, Seroquel, D.H.E. 45, Phenergan, Toradol, naproxen, lithium, propranolol, Effexor, amitriptyline, and Fioricet. She has recently been on Cymbalta.  She presents today for follow-up  unaccompanied.  Unfortunately, she has lost her health insurance.  She is not on any medications for migraine prevention. She is no longer taking Ajovy.  She reports her typical migraine as occurring in the right parietal area.  She reports she may have 1-2 migraines a month, but they may last 1 to 2 weeks each.  She describes a new event that occurred in May 2020, prompting ER visit.  She says she developed slurred speech, difficulty ambulating, but a few hours later developed a migraine headache.  She went to the ER and was diagnosed with a complicated migraine, had MRI of the brain 11/25/2018 that was normal.  She says the event reoccurred a few nights ago, but was not as significant.  She is a type I diabetic.  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  She says she has had a constant migraine for the last 8 days, that is 5/10 on pain scale.  She has finished her course of Thorazine.   REVIEW OF SYSTEMS: Out of a complete 14 system review of symptoms, the patient complains only of the following symptoms, and all other reviewed systems are negative.  Migraine  ALLERGIES: Allergies  Allergen Reactions  . Sulfa Antibiotics Nausea And Vomiting  . Wellbutrin [Bupropion] Other (See Comments)    "Makes me suicidal."  . Amoxicillin Rash    Has patient had a PCN reaction causing immediate rash, facial/tongue/throat swelling, SOB or lightheadedness with hypotension: Yes Has patient had a PCN reaction causing severe rash involving mucus membranes or skin necrosis: No Has patient had a PCN reaction that required hospitalization: No Has patient had  a PCN reaction occurring within the last 10 years: No If all of the above answers are "NO", then may proceed with Cephalosporin use.   . Codeine Rash    Cannot take just codeine. Able to tolerate derivatives such as hydrocodone    HOME MEDICATIONS: Outpatient Medications Prior to Visit  Medication Sig Dispense Refill  .  amphetamine-dextroamphetamine (ADDERALL) 20 MG tablet Take 20 mg by mouth 3 (three) times daily.     . blood glucose meter kit and supplies KIT Dispense based on patient and insurance preference. Use up to four times daily as directed. (FOR ICD-9 250.00, 250.01). 1 each 0  . clonazePAM (KLONOPIN) 0.5 MG tablet Take 0.5 mg by mouth 3 (three) times daily as needed for anxiety. For anxiety    . DULoxetine (CYMBALTA) 60 MG capsule Take 120 mg by mouth daily.    Marland Kitchen ibuprofen (ADVIL,MOTRIN) 800 MG tablet Take 1 tablet (800 mg total) by mouth every 8 (eight) hours as needed. (Patient taking differently: Take 800 mg by mouth every 8 (eight) hours as needed for moderate pain. ) 30 tablet 1  . Insulin Aspart, w/Niacinamide, (FIASP FLEXTOUCH) 100 UNIT/ML SOPN Inject 1-8 Units into the skin 3 (three) times daily. (Patient taking differently: Inject 1-12 Units into the skin 3 (three) times daily. Per sliding scale, 1 unit for 8 grams of carbs) 5 pen 0  . Insulin Pen Needle 31G X 5 MM MISC 1-8 Units by Does not apply route 3 (three) times daily. 100 each 0  . levonorgestrel (MIRENA) 20 MCG/24HR IUD 1 each by Intrauterine route once. Placed 06/2014    . Multiple Vitamins-Minerals (MULTIVITAMIN GUMMIES WOMENS) CHEW Chew 1 each by mouth daily.    . ondansetron (ZOFRAN) 4 MG tablet Take 1 tablet (4 mg total) by mouth 4 (four) times daily as needed for nausea or vomiting. 10 tablet 0  . REXULTI 2 MG TABS Take 2 mg by mouth every other day.     . solifenacin (VESICARE) 10 MG tablet Take 10 mg by mouth daily.    Marland Kitchen zolpidem (AMBIEN) 10 MG tablet Take 10 mg by mouth at bedtime.     . gabapentin (NEURONTIN) 300 MG capsule Take 600 mg by mouth 3 (three) times daily as needed (anxiety).     . insulin degludec (TRESIBA) 100 UNIT/ML SOPN FlexTouch Pen Inject 23 Units into the skin at bedtime.    . pantoprazole (PROTONIX) 20 MG tablet Take 1 tablet (20 mg total) by mouth daily. 30 tablet 0  . dexamethasone (DECADRON) 2 MG tablet  Start taking 3 tablets on day 1, then take 2 tablets, then take 1 (Patient taking differently: Take 2 mg by mouth See admin instructions. Start taking 3 tablets on day 1, then take 2 tablets, then take 1) 6 tablet 0  . diclofenac (CATAFLAM) 50 MG tablet Take 1 tablet (50 mg total) by mouth 2 (two) times daily as needed. (Patient taking differently: Take 50 mg by mouth 2 (two) times daily as needed (migraines). ) 30 tablet 1  . divalproex (DEPAKOTE) 500 MG DR tablet Start taking 1 tablet at bedtime x 3 weeks, if tolerating can increase to 1 tablet twice a day (Patient taking differently: Take 500 mg by mouth 2 (two) times daily as needed (migraines). ) 60 tablet 3  . metoCLOPramide (REGLAN) 10 MG tablet Take 1 tablet (10 mg total) by mouth every 6 (six) hours. 30 tablet 0  . oxyCODONE-acetaminophen (PERCOCET/ROXICET) 5-325 MG tablet Take 1-2 tablets by  mouth every 6 (six) hours as needed for severe pain. 15 tablet 0  . traZODone (DESYREL) 150 MG tablet Take 150 mg by mouth at bedtime as needed for sleep.      No facility-administered medications prior to visit.    PAST MEDICAL HISTORY: Past Medical History:  Diagnosis Date  . Adopted   . Cluster headache syndrome, intractable   . Depression   . Migraine without aura, with intractable migraine, so stated, without mention of status migrainosus 12/05/2012  . Ovarian cyst   . Sepsis ([place])   . STD (sexually transmitted disease)    HSV II   . Type I diabetes mellitus ([place])     PAST SURGICAL HISTORY: Past Surgical History:  Procedure Laterality Date  . APPENDECTOMY    . BUNIONECTOMY Bilateral   . CHOLECYSTECTOMY    . EYE SURGERY    . OVARIAN CYST REMOVAL      FAMILY HISTORY: Family History  Adopted: Yes  Problem Relation Age of Onset  . Diabetes Father     SOCIAL HISTORY: Social History   Socioeconomic History  . Marital status: Single    Spouse name: Not on file  . Number of children: 0  . Years of education: Not on file  .  Highest education level: Some college, no degree  Occupational History    Employer: [employer]  Tobacco Use  . Smoking status: Never Smoker  . Smokeless tobacco: Never Used  Substance and Sexual Activity  . Alcohol use: Yes    Comment: seldom  . Drug use: No  . Sexual activity: Yes    Partners: Male    Birth control/protection: I.U.D.  Other Topics Concern  . Not on file  Social History Narrative   Patient works at [employer] and lives alone.    She may stay with friends at times.   She has no children and a college education.   Some tea and soda   Right handed    Social Determinants of Health   Financial Resource Strain:   . Difficulty of Paying Living Expenses:   Food Insecurity:   . Worried About Charity fundraiser in the Last Year:   . Arboriculturist in the Last Year:   Transportation Needs:   . Film/video editor (Medical):   Marland Kitchen Lack of Transportation (Non-Medical):   Physical Activity:   . Days of Exercise per Week:   . Minutes of Exercise per Session:   Stress:   . Feeling of Stress :   Social Connections:   . Frequency of Communication with Friends and Family:   . Frequency of Social Gatherings with Friends and Family:   . Attends Religious Services:   . Active Member of Clubs or Organizations:   . Attends Archivist Meetings:   Marland Kitchen Marital Status:   Intimate Partner Violence:   . Fear of Current or Ex-Partner:   . Emotionally Abused:   Marland Kitchen Physically Abused:   . Sexually Abused:    PHYSICAL EXAM  Vitals:   10/08/19 1340  BP: (!) 144/92  Pulse: (!) 105  Temp: 97.7 F (36.5 C)  Weight: 159 lb (72.1 kg)  Height: 5' 1"  (1.549 m)   Body mass index is 30.04 kg/m.  Generalized: Well developed, in no acute distress   Neurological examination  Mentation: Alert oriented to time, place, history taking. Follows all commands speech and language fluent Cranial nerve II-XII: Pupils were equal round reactive to light. Extraocular movements  were full, visual field were full on confrontational test. Facial sensation and strength were normal. Head turning and shoulder shrug  were normal and symmetric. Motor: The motor testing reveals 5 over 5 strength of all 4 extremities. Good symmetric motor tone is noted throughout.  Sensory: Sensory testing is intact to soft touch on all 4 extremities. No evidence of extinction is noted.  Coordination: Cerebellar testing reveals good finger-nose-finger and heel-to-shin bilaterally.  Gait and station: Gait is normal. Tandem gait is normal. Romberg is negative. No drift is seen.  Reflexes: Deep tendon reflexes are symmetric and normal bilaterally.   DIAGNOSTIC DATA (LABS, IMAGING, TESTING) - I reviewed patient records, labs, notes, testing and imaging myself where available.  Lab Results  Component Value Date   WBC 7.2 03/14/2019   HGB 12.8 03/14/2019   HCT 39.0 03/14/2019   MCV 90.7 03/14/2019   PLT 308 03/14/2019      Component Value Date/Time   NA 134 (L) 03/14/2019 2226   NA 133 (L) 11/24/2013 1449   K 4.4 03/14/2019 2226   K 4.3 11/24/2013 1449   CL 102 03/14/2019 2226   CL 103 11/24/2013 1449   CO2 22 03/14/2019 2226   CO2 24 11/24/2013 1449   GLUCOSE 281 (H) 03/14/2019 2226   GLUCOSE 228 (H) 11/24/2013 1449   BUN 12 03/14/2019 2226   BUN 8 11/24/2013 1449   CREATININE 0.65 03/14/2019 2226   CREATININE 0.90 11/24/2013 1449   CALCIUM 9.1 03/14/2019 2226   CALCIUM 9.3 11/24/2013 1449   PROT 6.6 03/14/2019 2226   PROT 7.1 11/10/2013 0033   ALBUMIN 3.5 03/14/2019 2226   ALBUMIN 2.9 (L) 11/10/2013 0033   AST 11 (L) 03/14/2019 2226   AST 20 11/10/2013 0033   ALT 13 03/14/2019 2226   ALT 11 (L) 11/10/2013 0033   ALKPHOS 44 03/14/2019 2226   ALKPHOS 76 11/10/2013 0033   BILITOT 0.6 03/14/2019 2226   BILITOT 0.5 11/10/2013 0033   GFRNONAA >60 03/14/2019 2226   GFRNONAA >60 11/24/2013 1449   GFRAA >60 03/14/2019 2226   GFRAA >60 11/24/2013 1449   No results found for:  CHOL, HDL, LDLCALC, LDLDIRECT, TRIG, CHOLHDL Lab Results  Component Value Date   HGBA1C 7.5 (H) 09/17/2018   Lab Results  Component Value Date   VITAMINB12 406 09/18/2018   No results found for: TSH    ASSESSMENT AND PLAN 44 y.o. year old female  has a past medical history of Adopted, Cluster headache syndrome, intractable, Depression, Migraine without aura, with intractable migraine, so stated, without mention of status migrainosus (12/05/2012), Ovarian cyst, Sepsis ([place]), STD (sexually transmitted disease), and Type I diabetes mellitus ([place]). here with:  1.  Migraine headache  She now has health insurance.  She will resume Ajovy injections, rx sent.  I will give her Roselyn Meier to take as needed.  She has previously had complicated migraine with neurological features.  I will not offer triptan medication.  She will keep track of her headaches.  She will follow-up in 4 months or sooner if needed.    I spent 20 minutes of face-to-face and non-face-to-face time with patient.  This included previsit chart review, lab review, study review, order entry, electronic health record documentation, patient education.    Butler Denmark, AGNP-C, DNP 10/08/2019, 1:58 PM [facility] [address] [place], [place] [address] [phone]

## 2019-10-08 NOTE — Progress Notes (Signed)
I have read the note, and I agree with the clinical assessment and plan.  [name] [name]   

## 2019-10-08 NOTE — Patient Instructions (Signed)
Start Ajovy for headache, try Bernita Raisin for acute headache See you back in 4 months

## 2019-10-15 ENCOUNTER — Telehealth: Payer: Self-pay | Admitting: *Deleted

## 2019-10-15 NOTE — Telephone Encounter (Signed)
Candice Raisin PA, key: [id number], [id number], failed: mitrex, gabapentin, Topamax, Flexeril, Seroquel, D.H.E. 45, Phenergan, Toradol, naproxen, lithium, propranolol, Effexor, amitriptyline, and Fioricet. She has recently been on Cymbalta.

## 2019-10-16 ENCOUNTER — Encounter: Payer: Self-pay | Admitting: Neurology

## 2019-10-16 ENCOUNTER — Telehealth: Payer: Self-pay | Admitting: Neurology

## 2019-10-16 MED ORDER — DEXAMETHASONE 2 MG PO TABS: ORAL_TABLET | ORAL | 0 refills | Status: DC

## 2019-10-16 NOTE — Telephone Encounter (Signed)
I called the patient.  The patient is having some cognitive issues, she is finding it hard to perform handwriting.  I think that the Bernita Raisin likely is not the culprit, she is having a bad migraine, this is the likely source of her cognitive slowing issues.  The patient has diabetes but she claims that her blood sugars are under good control, I will give her a 3-day course of Decadron.

## 2019-10-16 NOTE — Addendum Note (Signed)
Addended by: York Spaniel on: 10/16/2019 11:46 AM   Modules accepted: Orders

## 2019-10-16 NOTE — Telephone Encounter (Signed)
Pt has called to report that she is having to think and work hard as it relates to walking and even holding a pen to be able to write.  Pt believes this is because of her Ubrogepant (UBRELVY) 100 MG TABS.  Pt is very concerned and is asking for a call from the on call provider.

## 2019-10-19 NOTE — Telephone Encounter (Signed)
Ubrelvy approved 10/15/19 to 10/14/2020. Approval faxed to Karin Golden, [address]

## 2019-10-21 ENCOUNTER — Telehealth: Payer: Self-pay

## 2019-10-21 NOTE — Telephone Encounter (Signed)
A PA has been resent through [employer] for AJOVY 225MG /1.5ML [id number] PA [phone]

## 2019-11-08 ENCOUNTER — Encounter: Payer: Self-pay | Admitting: Neurology

## 2019-11-09 ENCOUNTER — Telehealth: Payer: Self-pay | Admitting: Neurology

## 2019-11-09 NOTE — Telephone Encounter (Signed)
Spoke to pt and relayed that come in for infusion depacon at 1230-1300 today with intrafusion.  500mg  IV repeat x 1 prn, phenergan 25mg  IV , toradol 30mg  IV.  She took ondansetron yesterday, and no problem with nsaids.  She will have driver, is not pregnant.  I will give ajovy card for her to f/u on and see if can get , if not then try another injectable.  Pt aware and verbalized understanding of plan.

## 2019-11-09 NOTE — Telephone Encounter (Signed)
Spoke to pt and she started with migraine Thursday, things have progressed.  AJOVY (not taking due to insurance), Bernita Raisin not sorking (taking twice), Not on gabapentin,  Level 10, R side middle and back of head. Phono, photo sensitivity, nausea.  Infusion has worked before, has driver, not pregnant.  Please advise.

## 2019-11-09 NOTE — Telephone Encounter (Signed)
Okay to come for infusion, she can have Depacon 500 mg IV, may repeat x 1 if needed, phenergan 25 mg IV, Toradol 30 mg IV once, she needs driver. She can come to infusion suite between 12:30 and 1:00 today. Looks like only given Depacon in the infusion suite, make sure no contraindication to NSAID.   Make sure not taking additional phenergan.   Also, check on status of her Ajovy, she needs to be on some preventative, she has previously been on a multitude of medications for migraine prevention.

## 2019-11-09 NOTE — Telephone Encounter (Signed)
Pt states she has had a migraine since Thursday, she would like a call about scheduling an IV treatment for today please call

## 2019-11-11 ENCOUNTER — Telehealth: Payer: Self-pay | Admitting: *Deleted

## 2019-11-11 NOTE — Telephone Encounter (Signed)
Need PA for ajovy, pt cannot use the savings card.

## 2019-11-11 NOTE — Telephone Encounter (Addendum)
Initiated [facility] key [id number] Ajovy 225mg /ml   Determination pending.

## 2019-11-17 DIAGNOSIS — Z0289 Encounter for other administrative examinations: Secondary | ICD-10-CM

## 2019-11-25 NOTE — Telephone Encounter (Signed)
Additional questions answered on CMM.

## 2019-12-04 ENCOUNTER — Encounter: Payer: Self-pay | Admitting: Neurology

## 2019-12-07 ENCOUNTER — Telehealth: Payer: Self-pay | Admitting: Dermatology

## 2019-12-07 ENCOUNTER — Other Ambulatory Visit: Payer: Self-pay | Admitting: Neurology

## 2019-12-07 MED ORDER — EMGALITY 120 MG/ML ~~LOC~~ SOAJ: 120.0000 mg | SUBCUTANEOUS | 11 refills | Status: DC

## 2019-12-07 MED ORDER — EMGALITY 120 MG/ML ~~LOC~~ SOAJ: 240.0000 mg | SUBCUTANEOUS | 0 refills | Status: DC

## 2019-12-07 NOTE — Telephone Encounter (Signed)
Patient is calling to schedule an appointment (referral)

## 2019-12-07 NOTE — Progress Notes (Signed)
Apparently her insurance denied Ajovy. I will try Emgality.

## 2019-12-21 ENCOUNTER — Telehealth: Payer: Self-pay

## 2019-12-21 NOTE — Telephone Encounter (Signed)
PA for [facility] has been approved  this request is approved for the following time period: 12/17/2019 - 03/18/2020  LVM to inform pt Notice faxed to pharmacy

## 2020-01-13 ENCOUNTER — Telehealth: Payer: Self-pay

## 2020-01-13 NOTE — Telephone Encounter (Signed)
Voicemail message, 10:51am:  Patient states that she has been in the bed since Monday with a migraine and she is wanting to come in for IV treatment to help get rid of it.

## 2020-01-13 NOTE — Telephone Encounter (Signed)
Attempted to call pt, LVM  

## 2020-01-13 NOTE — Telephone Encounter (Signed)
Called pt and LMVM for that depacon that we use for infusion is not available, back ordered.  She may benefit going to ED or urgent care for infusion.  toradol or prednisone possible options here depending providers order.

## 2020-01-13 NOTE — Telephone Encounter (Signed)
I tried to call the patient twice , no answer. I can offer Decadron 2 mg, 3 day taper, was given by Dr. Anne Hahn in April if blood sugars are well controlled. Make sure she is on Emgality, she has [place] to use.

## 2020-01-14 NOTE — Telephone Encounter (Signed)
Attempted to call pt, LVM for pt to call back  

## 2020-01-24 ENCOUNTER — Other Ambulatory Visit: Payer: Self-pay | Admitting: Neurology

## 2020-01-28 ENCOUNTER — Other Ambulatory Visit: Payer: Self-pay | Admitting: Neurology

## 2020-01-29 ENCOUNTER — Encounter: Payer: Self-pay | Admitting: Neurology

## 2020-02-08 DIAGNOSIS — G5601 Carpal tunnel syndrome, right upper limb: Secondary | ICD-10-CM | POA: Insufficient documentation

## 2020-02-10 ENCOUNTER — Ambulatory Visit: Payer: BC Managed Care – PPO | Admitting: Neurology

## 2020-03-29 ENCOUNTER — Other Ambulatory Visit: Payer: Self-pay

## 2020-03-29 ENCOUNTER — Encounter: Payer: Self-pay | Admitting: Dermatology

## 2020-03-29 ENCOUNTER — Ambulatory Visit (INDEPENDENT_AMBULATORY_CARE_PROVIDER_SITE_OTHER): Payer: BC Managed Care – PPO | Admitting: Dermatology

## 2020-03-29 DIAGNOSIS — L309 Dermatitis, unspecified: Secondary | ICD-10-CM | POA: Diagnosis not present

## 2020-03-29 MED ORDER — HYDROCORTISONE 2.5 % EX OINT: TOPICAL_OINTMENT | Freq: Every day | CUTANEOUS | 0 refills | Status: DC

## 2020-04-28 NOTE — Progress Notes (Signed)
   New Patient   Subjective  Lavaun Greenfield Warr is a 44 y.o. female who presents for the following: Eczema (LAST SPRING FLARE ON FACE BUT MUCH BETTER WITH TAC PCP).  Eczema Location:  Duration:  Quality:  Associated Signs/Symptoms: Modifying Factors:  Severity:  Timing: Context:    The following portions of the chart were reviewed this encounter and updated as appropriate: Tobacco  Allergies  Meds  Problems  Med Hx  Surg Hx  Fam Hx      Objective  Well appearing patient in no apparent distress; mood and affect are within normal limits.  All skin waist up examined.   Assessment & Plan  Eczema, unspecified type Head - Anterior (Face)  Patient is to stop the triamcinolone from her PCP and start using the new prescription Hytone 2.5% ointment on face when she has her flare ups.  Ordered Medications: hydrocortisone 2.5 % ointment

## 2020-05-02 ENCOUNTER — Encounter: Payer: Self-pay | Admitting: Dermatology

## 2020-05-19 ENCOUNTER — Institutional Professional Consult (permissible substitution): Payer: BC Managed Care – PPO | Admitting: Neurology

## 2020-06-01 ENCOUNTER — Telehealth: Payer: Self-pay | Admitting: Neurology

## 2020-06-01 NOTE — Telephone Encounter (Signed)
Received a PA request for [employer] PA was started on [email or web address]. Key is [id number]. PA was approved 05/31/20 to 05/31/21. A copy of a determination was faxed to patient's pharmacy.

## 2020-06-30 ENCOUNTER — Ambulatory Visit: Payer: Self-pay | Admitting: Neurology

## 2020-06-30 ENCOUNTER — Encounter: Payer: Self-pay | Admitting: Neurology

## 2020-06-30 NOTE — Progress Notes (Deleted)
PATIENT: [name] DOB: 08/23/1975  REASON FOR VISIT: follow up HISTORY FROM: patient  HISTORY OF PRESENT ILLNESS: Today 06/30/20 [name] is a 44 year old female with history of intractable migraine headache.  Taking aspirin 81 mg daily due to history of complicated migraine with neurological symptoms.  She is a type I diabetic. HISTORY 10/08/2019 SS: [name] is a 44 year old female with history of intractable migraine headache.  She was previously evaluated in the ER for a complicated migraine, with neurological symptoms.  She is to be taking aspirin 81 mg daily as result.  She is a type I diabetic.  She is no longer taking Depakote.  For about 6 months, she had no headache, as result of less stressed not working.  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  Her headaches have returned.  She is having about 10 migraines a month.  Headaches are usually right-sided, parietal, frontal area.  Photophobia, nausea.  No further stroke like symptoms of migraine.  She sees a psychiatrist. She has been on Ajovy in the past for migraine prevention.She has been on a multitude of medications in the past including Imitrex, gabapentin, Topamax, Flexeril, Seroquel, D.H.E. 45, Phenergan, Toradol, naproxen, lithium, propranolol, Effexor, Cymbalta, amitriptyline, and Fioricet. She says her overall health has been good.  Presents today for evaluation unaccompanied.   REVIEW OF SYSTEMS: Out of a complete 14 system review of symptoms, the patient complains only of the following symptoms, and all other reviewed systems are negative.  ALLERGIES: Allergies  Allergen Reactions  . Sulfa Antibiotics Nausea And Vomiting  . Wellbutrin [Bupropion] Other (See Comments)    "Makes me suicidal."  . Amoxicillin Rash    Has patient had a PCN reaction causing immediate rash, facial/tongue/throat swelling, SOB or lightheadedness with hypotension: Yes Has patient had a PCN reaction causing severe rash  involving mucus membranes or skin necrosis: No Has patient had a PCN reaction that required hospitalization: No Has patient had a PCN reaction occurring within the last 10 years: No If all of the above answers are "NO", then may proceed with Cephalosporin use.   . Codeine Rash    Cannot take just codeine. Able to tolerate derivatives such as hydrocodone    HOME MEDICATIONS: Outpatient Medications Prior to Visit  Medication Sig Dispense Refill  . amphetamine-dextroamphetamine (ADDERALL) 20 MG tablet Take 20 mg by mouth 3 (three) times daily.     . blood glucose meter kit and supplies KIT Dispense based on patient and insurance preference. Use up to four times daily as directed. (FOR ICD-9 250.00, 250.01). 1 each 0  . clonazePAM (KLONOPIN) 0.5 MG tablet Take 0.5 mg by mouth 3 (three) times daily as needed for anxiety. For anxiety    . dexamethasone (DECADRON) 2 MG tablet Take 3 tablets the first day, 2 the second and 1 the third day (Patient not taking: Reported on 03/29/2020) 6 tablet 0  . DULoxetine (CYMBALTA) 60 MG capsule Take 120 mg by mouth daily.    Marland Kitchen gabapentin (NEURONTIN) 300 MG capsule Take 600 mg by mouth 3 (three) times daily as needed (anxiety).     . Galcanezumab-gnlm (EMGALITY) 120 MG/ML SOAJ Inject 240 mg into the skin every 30 (thirty) days. 2 pen 0  . Galcanezumab-gnlm (EMGALITY) 120 MG/ML SOAJ Inject 120 mg into the skin every 30 (thirty) days. 1 pen 11  . hydrocortisone 2.5 % ointment Apply topically at bedtime. 30 g 0  . ibuprofen (ADVIL,MOTRIN) 800 MG  tablet Take 1 tablet (800 mg total) by mouth every 8 (eight) hours as needed. (Patient taking differently: Take 800 mg by mouth every 8 (eight) hours as needed for moderate pain. ) 30 tablet 1  . Insulin Aspart, w/Niacinamide, (FIASP FLEXTOUCH) 100 UNIT/ML SOPN Inject 1-8 Units into the skin 3 (three) times daily. (Patient taking differently: Inject 1-12 Units into the skin 3 (three) times daily. Per sliding scale, 1 unit for 8  grams of carbs) 5 pen 0  . insulin degludec (TRESIBA) 100 UNIT/ML SOPN FlexTouch Pen Inject 23 Units into the skin at bedtime.    . Insulin Pen Needle 31G X 5 MM MISC 1-8 Units by Does not apply route 3 (three) times daily. 100 each 0  . levonorgestrel (MIRENA) 20 MCG/24HR IUD 1 each by Intrauterine route once. Placed 06/2014    . Multiple Vitamins-Minerals (MULTIVITAMIN GUMMIES WOMENS) CHEW Chew 1 each by mouth daily.    . ondansetron (ZOFRAN) 4 MG tablet Take 1 tablet (4 mg total) by mouth 4 (four) times daily as needed for nausea or vomiting. 10 tablet 3  . pantoprazole (PROTONIX) 20 MG tablet Take 1 tablet (20 mg total) by mouth daily. 30 tablet 0  . REXULTI 2 MG TABS Take 2 mg by mouth every other day.     . solifenacin (VESICARE) 10 MG tablet Take 10 mg by mouth daily. (Patient not taking: Reported on 03/29/2020)    . Ubrogepant (UBRELVY) 100 MG TABS Take 100 mg by mouth as needed (my repeat once 2 hours after initial dose, max is 200 mg/24 hours). (Patient not taking: Reported on 03/29/2020) 10 tablet 11  . zolpidem (AMBIEN) 10 MG tablet Take 10 mg by mouth at bedtime.      No facility-administered medications prior to visit.    PAST MEDICAL HISTORY: Past Medical History:  Diagnosis Date  . Adopted   . Cluster headache syndrome, intractable   . Depression   . Migraine without aura, with intractable migraine, so stated, without mention of status migrainosus 12/05/2012  . Ovarian cyst   . Sepsis (HCC)   . STD (sexually transmitted disease)    HSV II   . Type I diabetes mellitus (HCC)     PAST SURGICAL HISTORY: Past Surgical History:  Procedure Laterality Date  . APPENDECTOMY    . BUNIONECTOMY Bilateral   . CHOLECYSTECTOMY    . EYE SURGERY    . OVARIAN CYST REMOVAL      FAMILY HISTORY: Family History  Adopted: Yes  Problem Relation Age of Onset  . Diabetes Father     SOCIAL HISTORY: Social History   Socioeconomic History  . Marital status: Single    Spouse name: Not  on file  . Number of children: 0  . Years of education: Not on file  . Highest education level: Some college, no degree  Occupational History    Employer: [employer]  Tobacco Use  . Smoking status: Never Smoker  . Smokeless tobacco: Never Used  Vaping Use  . Vaping Use: Never used  Substance and Sexual Activity  . Alcohol use: Yes    Comment: seldom  . Drug use: No  . Sexual activity: Yes    Partners: Male    Birth control/protection: I.U.D.  Other Topics Concern  . Not on file  Social History Narrative   Patient works at [employer] and lives alone.    She may stay with friends at times.   She has no children and a college  education.   Some tea and soda   Right handed    Social Determinants of Health   Financial Resource Strain: Not on file  Food Insecurity: Not on file  Transportation Needs: Not on file  Physical Activity: Not on file  Stress: Not on file  Social Connections: Not on file  Intimate Partner Violence: Not on file      PHYSICAL EXAM  There were no vitals filed for this visit. There is no height or weight on file to calculate BMI.  Generalized: Well developed, in no acute distress   Neurological examination  Mentation: Alert oriented to time, place, history taking. Follows all commands speech and language fluent Cranial nerve II-XII: Pupils were equal round reactive to light. Extraocular movements were full, visual field were full on confrontational test. Facial sensation and strength were normal. Uvula tongue midline. Head turning and shoulder shrug  were normal and symmetric. Motor: The motor testing reveals 5 over 5 strength of all 4 extremities. Good symmetric motor tone is noted throughout.  Sensory: Sensory testing is intact to soft touch on all 4 extremities. No evidence of extinction is noted.  Coordination: Cerebellar testing reveals good finger-nose-finger and heel-to-shin bilaterally.  Gait and station: Gait is normal. Tandem gait is  normal. Romberg is negative. No drift is seen.  Reflexes: Deep tendon reflexes are symmetric and normal bilaterally.   DIAGNOSTIC DATA (LABS, IMAGING, TESTING) - I reviewed patient records, labs, notes, testing and imaging myself where available.  Lab Results  Component Value Date   WBC 7.2 03/14/2019   HGB 12.8 03/14/2019   HCT 39.0 03/14/2019   MCV 90.7 03/14/2019   PLT 308 03/14/2019      Component Value Date/Time   NA 134 (L) 03/14/2019 2226   NA 133 (L) 11/24/2013 1449   K 4.4 03/14/2019 2226   K 4.3 11/24/2013 1449   CL 102 03/14/2019 2226   CL 103 11/24/2013 1449   CO2 22 03/14/2019 2226   CO2 24 11/24/2013 1449   GLUCOSE 281 (H) 03/14/2019 2226   GLUCOSE 228 (H) 11/24/2013 1449   BUN 12 03/14/2019 2226   BUN 8 11/24/2013 1449   CREATININE 0.65 03/14/2019 2226   CREATININE 0.90 11/24/2013 1449   CALCIUM 9.1 03/14/2019 2226   CALCIUM 9.3 11/24/2013 1449   PROT 6.6 03/14/2019 2226   PROT 7.1 11/10/2013 0033   ALBUMIN 3.5 03/14/2019 2226   ALBUMIN 2.9 (L) 11/10/2013 0033   AST 11 (L) 03/14/2019 2226   AST 20 11/10/2013 0033   ALT 13 03/14/2019 2226   ALT 11 (L) 11/10/2013 0033   ALKPHOS 44 03/14/2019 2226   ALKPHOS 76 11/10/2013 0033   BILITOT 0.6 03/14/2019 2226   BILITOT 0.5 11/10/2013 0033   GFRNONAA >60 03/14/2019 2226   GFRNONAA >60 11/24/2013 1449   GFRAA >60 03/14/2019 2226   GFRAA >60 11/24/2013 1449   No results found for: CHOL, HDL, LDLCALC, LDLDIRECT, TRIG, CHOLHDL Lab Results  Component Value Date   HGBA1C 7.5 (H) 09/17/2018   Lab Results  Component Value Date   VITAMINB12 406 09/18/2018   No results found for: TSH    ASSESSMENT AND PLAN 44 y.o. year old female  has a past medical history of Adopted, Cluster headache syndrome, intractable, Depression, Migraine without aura, with intractable migraine, so stated, without mention of status migrainosus (12/05/2012), Ovarian cyst, Sepsis ([place]), STD (sexually transmitted disease), and Type I  diabetes mellitus ([place]). here with ***   I spent 15 minutes  with the patient. 50% of this time was spent   Butler Denmark, [place], DNP 06/30/2020, 5:35 AM [facility] [address], [place], [place] [address] [phone]

## 2020-07-20 ENCOUNTER — Other Ambulatory Visit: Payer: Self-pay

## 2020-07-20 ENCOUNTER — Ambulatory Visit: Payer: BC Managed Care – PPO | Admitting: Neurology

## 2020-07-20 ENCOUNTER — Encounter: Payer: Self-pay | Admitting: Neurology

## 2020-07-20 VITALS — BP 152/84 | HR 107 | Ht 61.0 in | Wt 167.3 lb

## 2020-07-20 DIAGNOSIS — E669 Obesity, unspecified: Secondary | ICD-10-CM

## 2020-07-20 DIAGNOSIS — R0683 Snoring: Secondary | ICD-10-CM | POA: Diagnosis not present

## 2020-07-20 DIAGNOSIS — G4700 Insomnia, unspecified: Secondary | ICD-10-CM

## 2020-07-20 DIAGNOSIS — F329 Major depressive disorder, single episode, unspecified: Secondary | ICD-10-CM

## 2020-07-20 DIAGNOSIS — G4719 Other hypersomnia: Secondary | ICD-10-CM | POA: Diagnosis not present

## 2020-07-20 NOTE — Patient Instructions (Addendum)
Based on your symptoms and your exam I believe we should look for an underlying organic sleep disorder, such as obstructive sleep apnea (OSA) with a sleep study.  If you have more than mild OSA, I want you to consider treatment with CPAP. Please remember, the risks and ramifications of moderate to severe obstructive sleep apnea or OSA are: Cardiovascular disease, including congestive heart failure, stroke, difficult to control hypertension, arrhythmias, and even type 2 diabetes has been linked to untreated OSA. Sleep apnea causes disruption of sleep and sleep deprivation in most cases, which, in turn, can cause recurrent headaches, problems with memory, mood, concentration, focus, and vigilance. Most people with untreated sleep apnea report excessive daytime sleepiness, which can affect their ability to drive. Please do not drive if you feel sleepy.   We will call you after your sleep study to advise about the results (most likely, you will hear from [facility], my nurse).    Our sleep lab administrative assistant, will call you to schedule your sleep study. If you don't hear back from her by about 2 weeks from now, please feel free to call her at [phone]. This is her direct line and please leave a message with your phone number to call back if you get the voicemail box. She will call back as soon as possible.   Please discuss with Dr. Evelene Croon, if she wants you to continue with individual.  As discussed, for evaluation for narcolepsy you would have to be tapered off all your psychotropic and sedating medications which is several medications in your case.  I do not believe it is feasible that you come off of all your psychotropic medications at this time.  Please continue with your medications as prescribed and follow-up with your psychiatrist as scheduled.

## 2020-07-20 NOTE — Progress Notes (Signed)
Subjective:    Patient ID: [name] is a 45 y.o. female.  HPI     Star Age, MD, PhD [facility] [address], [address]O. [place], [place] [address]  Dear [name],   I saw your patient, [name], upon your kind request in my sleep clinic today for initial consultation of her sleep disorder, in particular, concern for underlying obstructive sleep apnea.  Patient is unaccompanied today.  As you know, [name] is a 45 year old right-handed woman with an underlying medical history of diabetes type 1, migraine headaches (followed by Dr. Jannifer Franklin and Butler Denmark, NP), mood disorder including major depressive disorder, and borderline obesity, who reports a longstanding history of difficulty initiating and maintaining sleep for years.  She reports increasing daytime somnolence.  She was recently started on new vigil 150 mg strength in or around December 2021 and this was subsequently increased to 250 mg once daily.  She reports that the new vigil has been helpful in helping her stay awake.  She is worried about falling asleep while at work or while at the wheel.  She has a 40-minute commute. She is a Pharmacist, hospital.  He reports snoring.  Her boyfriend currently does not complain about it and reports mild or intermittent snoring only.  She has never had a sleep study.  She denies recurrent morning headaches or nighttime nocturia.  She is single, she lives with her boyfriend.  She endorses quite a bit of stress and worry.  She does become tearful during our visit. I reviewed your office note from 04/07/2020.  Her Epworth sleepiness score is 5 out of 24, fatigue severity score is 59 out of 63.  She generally goes to bed between 830 and 9 and rise time is around 4 AM.  She has 1 dog in the household.  She is a restless sleeper.  She is a non-smoker, drinks caffeine in the form of hot tea and unsweetened ice tea as well as diet Pepsi, 3-6 bottles per day, typically 20 ounce  size.  Of note, she is on several psychotropic medications but was recently advised to taper off Rexulti.  She is currently on Adderall 20 mg strength 4 times a day, clonazepam 0.5 mg strength up to 4 times a day as needed, Cymbalta 180 mg daily, gabapentin 300 mg 3 times daily.  She is familiar with the sleep apnea diagnosis as her adoptive mom has sleep apnea.  She does not know her biological family history.  Her Past Medical History Is Significant For: Past Medical History:  Diagnosis Date  . Adopted   . Cluster headache syndrome, intractable   . Depression   . Migraine without aura, with intractable migraine, so stated, without mention of status migrainosus 12/05/2012  . Ovarian cyst   . Sepsis ([place])   . STD (sexually transmitted disease)    HSV II   . Type I diabetes mellitus ([place])     Her Past Surgical History Is Significant For: Past Surgical History:  Procedure Laterality Date  . APPENDECTOMY    . BUNIONECTOMY Bilateral   . CHOLECYSTECTOMY    . EYE SURGERY    . OVARIAN CYST REMOVAL      Her Family History Is Significant For: Family History  Adopted: Yes  Problem Relation Age of Onset  . Diabetes Father     Her Social History Is Significant For: Social History   Socioeconomic History  . Marital status: Single    Spouse name:  Not on file  . Number of children: 0  . Years of education: Not on file  . Highest education level: Some college, no degree  Occupational History    Employer: [employer]  Tobacco Use  . Smoking status: Never Smoker  . Smokeless tobacco: Never Used  Vaping Use  . Vaping Use: Never used  Substance and Sexual Activity  . Alcohol use: Yes    Comment: seldom  . Drug use: No  . Sexual activity: Yes    Partners: Male    Birth control/protection: I.U.D.  Other Topics Concern  . Not on file  Social History Narrative   Patient works at [employer] and lives alone.    She may stay with friends at times.   She has no children and a  college education.   Some tea and soda   Right handed    Social Determinants of Health   Financial Resource Strain: Not on file  Food Insecurity: Not on file  Transportation Needs: Not on file  Physical Activity: Not on file  Stress: Not on file  Social Connections: Not on file    Her Allergies Are:  Allergies  Allergen Reactions  . Sulfa Antibiotics Nausea And Vomiting  . Wellbutrin [Bupropion] Other (See Comments)    "Makes me suicidal."  . Amoxicillin Rash    Has patient had a PCN reaction causing immediate rash, facial/tongue/throat swelling, SOB or lightheadedness with hypotension: Yes Has patient had a PCN reaction causing severe rash involving mucus membranes or skin necrosis: No Has patient had a PCN reaction that required hospitalization: No Has patient had a PCN reaction occurring within the last 10 years: No If all of the above answers are "NO", then may proceed with Cephalosporin use.   . Codeine Rash    Cannot take just codeine. Able to tolerate derivatives such as hydrocodone  :   Her Current Medications Are:  Outpatient Encounter Medications as of 07/20/2020  Medication Sig  . amphetamine-dextroamphetamine (ADDERALL) 20 MG tablet Take 20 mg by mouth 4 (four) times daily.  . blood glucose meter kit and supplies KIT Dispense based on patient and insurance preference. Use up to four times daily as directed. (FOR ICD-9 250.00, 250.01).  . clonazePAM (KLONOPIN) 0.5 MG tablet Take 0.5 mg by mouth 4 (four) times daily. For anxiety  . dexamethasone (DECADRON) 2 MG tablet Take 3 tablets the first day, 2 the second and 1 the third day  . DULoxetine (CYMBALTA) 60 MG capsule Take 180 mg by mouth daily.  Marland Kitchen gabapentin (NEURONTIN) 300 MG capsule Take 600 mg by mouth 3 (three) times daily as needed (anxiety).   . Galcanezumab-gnlm (EMGALITY) 120 MG/ML SOAJ Inject 240 mg into the skin every 30 (thirty) days.  . Galcanezumab-gnlm (EMGALITY) 120 MG/ML SOAJ Inject 120 mg into the  skin every 30 (thirty) days.  Marland Kitchen ibuprofen (ADVIL,MOTRIN) 800 MG tablet Take 1 tablet (800 mg total) by mouth every 8 (eight) hours as needed. (Patient taking differently: Take 800 mg by mouth every 8 (eight) hours as needed for moderate pain.)  . Insulin Aspart, w/Niacinamide, (FIASP FLEXTOUCH) 100 UNIT/ML SOPN Inject 1-8 Units into the skin 3 (three) times daily. (Patient taking differently: Inject 1-12 Units into the skin 3 (three) times daily. Per sliding scale, 1 unit for 8 grams of carbs)  . insulin degludec (TRESIBA) 100 UNIT/ML SOPN FlexTouch Pen Inject 16 Units into the skin at bedtime.  . Insulin Pen Needle 31G X 5 MM MISC 1-8 Units  by Does not apply route 3 (three) times daily.  Marland Kitchen levonorgestrel (MIRENA) 20 MCG/24HR IUD 1 each by Intrauterine route once. Placed 06/2014  . Multiple Vitamins-Minerals (MULTIVITAMIN GUMMIES WOMENS) CHEW Chew 1 each by mouth daily.  . pantoprazole (PROTONIX) 20 MG tablet Take 1 tablet (20 mg total) by mouth daily.  Marland Kitchen zolpidem (AMBIEN CR) 12.5 MG CR tablet Take by mouth.  . [DISCONTINUED] dicyclomine (BENTYL) 20 MG tablet Take 1 tablet (20 mg total) by mouth 2 (two) times daily. (Patient not taking: Reported on 12/20/2018)  . [DISCONTINUED] hydrocortisone 2.5 % ointment Apply topically at bedtime. (Patient not taking: Reported on 07/20/2020)  . [DISCONTINUED] insulin aspart (NOVOLOG) 100 UNIT/ML injection Inject 0-9 Units into the skin 3 (three) times daily with meals. (Patient not taking: Reported on 12/20/2018)  . [DISCONTINUED] Insulin Glargine (BASAGLAR KWIKPEN) 100 UNIT/ML SOPN Inject 0.22 mLs (22 Units total) into the skin daily. (Patient not taking: Reported on 12/20/2018)  . [DISCONTINUED] misoprostol (CYTOTEC) 200 MCG tablet Place 2 tablets vaginally 6-12 hours prior to IUD insertion (Patient not taking: Reported on 12/20/2018)  . [DISCONTINUED] ondansetron (ZOFRAN) 4 MG tablet Take 1 tablet (4 mg total) by mouth 4 (four) times daily as needed for nausea or  vomiting.  . [DISCONTINUED] REXULTI 2 MG TABS Take 2 mg by mouth every other day.   . [DISCONTINUED] solifenacin (VESICARE) 10 MG tablet Take 10 mg by mouth daily. (Patient not taking: Reported on 03/29/2020)  . [DISCONTINUED] Ubrogepant (UBRELVY) 100 MG TABS Take 100 mg by mouth as needed (my repeat once 2 hours after initial dose, max is 200 mg/24 hours). (Patient not taking: Reported on 03/29/2020)  . [DISCONTINUED] zolpidem (AMBIEN) 10 MG tablet Take 10 mg by mouth at bedtime.  (Patient not taking: Reported on 07/20/2020)   No facility-administered encounter medications on file as of 07/20/2020.  :  Review of Systems:  Out of a complete 14 point review of systems, all are reviewed and negative with the exception of these symptoms as listed below:  Review of Systems  Neurological:       Here for sleep consult. No prior sleep study. Reports she has trouble falling and staying asleep at night. Sts she tries to get at least 7 hours of sleep at night and she thinks she does snore.   Epworth Sleepiness Scale 0= would never doze 1= slight chance of dozing 2= moderate chance of dozing 3= high chance of dozing  Sitting and reading:1 Watching TV:0 Sitting inactive in a public place (ex. Theater or meeting):0 As a passenger in a car for an hour without a break:1 Lying down to rest in the afternoon:0 Sitting and talking to someone:0 Sitting quietly after lunch (no alcohol):2 In a car, while stopped in traffic:1 Total:5     Objective:  Neurological Exam  Physical Exam Physical Examination:   Vitals:   07/20/20 1226  BP: (!) 152/84  Pulse: (!) 107  SpO2: 98%    General Examination: The patient is a very pleasant 45 y.o. female in no acute distress. She appears well-developed and well-nourished and well groomed.   HEENT: Normocephalic, atraumatic, pupils are equal, round and reactive to light, extraocular tracking is good without limitation to gaze excursion or nystagmus noted.  Hearing is grossly intact. Face is symmetric with normal facial animation. Speech is clear with no dysarthria noted. There is no hypophonia. There is no lip, neck/head, jaw or voice tremor. Neck is supple with full range of passive and active motion. There are  no carotid bruits on auscultation. Oropharynx exam reveals: mild mouth dryness, adequate dental hygiene and mild to moderate airway crowding secondary to thicker appearing tongue, small airway entry, tonsillar size of 1-2+ bilaterally, Mallampati class III, neck circumference of 14 inches.  Tongue protrudes centrally and palate elevates symmetrically.  Chest: Clear to auscultation without wheezing, rhonchi or crackles noted.  Heart: S1+S2+0, regular and normal without murmurs, rubs or gallops noted.   Abdomen: Soft, non-tender and non-distended with normal bowel sounds appreciated on auscultation.  Extremities: There is no pitting edema in the distal lower extremities bilaterally.   Skin: Warm and dry without trophic changes noted.   Musculoskeletal: exam reveals no obvious joint deformities, tenderness or joint swelling or erythema.   Neurologically:  Mental status: The patient is awake, alert and oriented in all 4 spheres. Her immediate and remote memory, attention, language skills and fund of knowledge are appropriate. There is no evidence of aphasia, agnosia, apraxia or anomia. Speech is clear with normal prosody and enunciation. Thought process is linear. Mood is normal and affect is blunted, she does become tearful briefly during her appointment.  Cranial nerves II - XII are as described above under HEENT exam.  Motor exam: Normal bulk, strength and tone is noted. There is no tremor, Romberg is negative. Reflexes are 2+ throughout. Fine motor skills and coordination: grossly intact.  Cerebellar testing: No dysmetria or intention tremor. There is no truncal or gait ataxia.  Sensory exam: intact to light touch in the upper and lower  extremities.  Gait, station and balance: She stands easily. No veering to one side is noted. No leaning to one side is noted. Posture is age-appropriate and stance is narrow based. Gait shows normal stride length and normal pace. No problems turning are noted. Tandem walk is unremarkable.                Assessment and Plan:   In summary, Vernell Back Pulver is a very pleasant 45 y.o.-year old female with an underlying medical history of diabetes type 1, migraine headaches (followed by Dr. Jannifer Franklin and Butler Denmark, NP), mood disorder including major depressive disorder, and borderline obesity, who presents for evaluation of her sleep disorder including difficulty maintaining sleep, difficulty initiating sleep, restless sleep, daytime somnolence.  She does report some snoring, history and examination are concerning for underlying sleep disordered breathing.  Evaluation for abnormal and pathological daytime somnolence would include extended testing with a nighttime and daytime sleep study in the absence of any psychotropic or sedating medications which in her case would be very difficult to pursue.  She agrees that it will be hard for her to taper off all her psychotropic medications.  We mutually agreed to pursue an overnight polysomnogram to look at her nighttime sleep study, in particular to help rule out sleep disordered breathing.  If she has obstructive sleep apnea, even if it is mild, she may benefit from treatment with a CPAP or AutoPap machine.  We talked about this and discussed treatment options for sleep apnea.  She would be willing to try CPAP or AutoPap therapy if the need arises.  For now, we will pursue evaluation of her nocturnal sleep, for this, she can continue with her current medication regimen.  She is wondering if she should continue with individual.  She is encouraged to discuss ongoing treatment with Nuvigil with you.  I plan to see her back after her sleep testing.  I answered all her questions  today and she was  in agreement.   Thank you very much for allowing me to participate in the care of this nice patient. If I can be of any further assistance to you please do not hesitate to call me at [phone].  Sincerely,   Star Age, MD, PhD

## 2020-07-26 ENCOUNTER — Encounter: Payer: Self-pay | Admitting: Neurology

## 2020-07-26 ENCOUNTER — Ambulatory Visit: Payer: BC Managed Care – PPO | Admitting: Neurology

## 2020-07-26 ENCOUNTER — Other Ambulatory Visit: Payer: Self-pay

## 2020-07-26 ENCOUNTER — Telehealth: Payer: Self-pay | Admitting: Neurology

## 2020-07-26 VITALS — BP 142/85 | HR 97 | Ht 64.0 in | Wt 165.0 lb

## 2020-07-26 DIAGNOSIS — G43019 Migraine without aura, intractable, without status migrainosus: Secondary | ICD-10-CM | POA: Diagnosis not present

## 2020-07-26 MED ORDER — DEXAMETHASONE 2 MG PO TABS: ORAL_TABLET | ORAL | 0 refills | Status: DC

## 2020-07-26 MED ORDER — EMGALITY 120 MG/ML ~~LOC~~ SOAJ: 120.0000 mg | SUBCUTANEOUS | 11 refills | Status: DC

## 2020-07-26 MED ORDER — ONDANSETRON HCL 4 MG PO TABS: 4.0000 mg | ORAL_TABLET | Freq: Three times a day (TID) | ORAL | 0 refills | Status: DC | PRN

## 2020-07-26 MED ORDER — NURTEC 75 MG PO TBDP: 75.0000 mg | ORAL_TABLET | ORAL | 11 refills | Status: DC | PRN

## 2020-07-26 NOTE — Progress Notes (Signed)
I have read the note, and I agree with the clinical assessment and plan.  Charles K Willis   

## 2020-07-26 NOTE — Patient Instructions (Signed)
Will send Decadron taper for headache  Try Nurtec for acute headache  Aspirin 81 mg daily for history complicated migraine See you back in 6 months

## 2020-07-26 NOTE — Progress Notes (Signed)
PATIENT: [name] DOB: [birth year]  REASON FOR VISIT: follow up HISTORY FROM: patient  HISTORY OF PRESENT ILLNESS: Today 07/26/20 [name] is a 45 year old female with history of intractable migraine headache.  On Emgality since June, headache control much improved, no significant headache up until about a week ago.  For the last 6 days, has had constant migraine.  Previously tried [place] without benefit.  History of complicated migraine with neurological symptoms, have not given triptans.  Supposed to be on aspirin 81 mg daily.  She is a type I diabetic.Had sleep consultation with Dr. Rexene Alberts, pending sleep study.  Note neurological symptoms with this headache, current headache is to the top of the head, with migraine features, nausea.  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  Here today for evaluation unaccompanied.  HISTORY 10/08/2019 SS: [name] is a 45 year old female with history of intractable migraine headache.  She was previously evaluated in the ER for a complicated migraine, with neurological symptoms.  She is to be taking aspirin 81 mg daily as result.  She is a type I diabetic.  She is no longer taking Depakote.  For about 6 months, she had no headache, as result of less stressed not working.  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  Her headaches have returned.  She is having about 10 migraines a month.  Headaches are usually right-sided, parietal, frontal area.  Photophobia, nausea.  No further stroke like symptoms of migraine.  She sees a psychiatrist. She has been on Ajovy in the past for migraine prevention.She has been on a multitude of medications in the past including Imitrex, gabapentin, Topamax, Flexeril, Seroquel, D.H.E. 45, Phenergan, Toradol, naproxen, lithium, propranolol, Effexor, Cymbalta, amitriptyline, and Fioricet. She says her overall health has been good.  Presents today for evaluation unaccompanied.   REVIEW  OF SYSTEMS: Out of a complete 14 system review of symptoms, the patient complains only of the following symptoms, and all other reviewed systems are negative.  Headache  ALLERGIES: Allergies  Allergen Reactions  . Sulfa Antibiotics Nausea And Vomiting  . Wellbutrin [Bupropion] Other (See Comments)    "Makes me suicidal."  . Amoxicillin Rash    Has patient had a PCN reaction causing immediate rash, facial/tongue/throat swelling, SOB or lightheadedness with hypotension: Yes Has patient had a PCN reaction causing severe rash involving mucus membranes or skin necrosis: No Has patient had a PCN reaction that required hospitalization: No Has patient had a PCN reaction occurring within the last 10 years: No If all of the above answers are "NO", then may proceed with Cephalosporin use.   . Codeine Rash    Cannot take just codeine. Able to tolerate derivatives such as hydrocodone    HOME MEDICATIONS: Outpatient Medications Prior to Visit  Medication Sig Dispense Refill  . amphetamine-dextroamphetamine (ADDERALL) 20 MG tablet Take 20 mg by mouth 4 (four) times daily.    . blood glucose meter kit and supplies KIT Dispense based on patient and insurance preference. Use up to four times daily as directed. (FOR ICD-9 250.00, 250.01). 1 each 0  . clonazePAM (KLONOPIN) 0.5 MG tablet Take 1 mg by mouth 4 (four) times daily. For anxiety    . DULoxetine (CYMBALTA) 60 MG capsule Take 180 mg by mouth daily.    Marland Kitchen gabapentin (NEURONTIN) 300 MG capsule Take 300 mg by mouth 4 (four) times daily.    Marland Kitchen ibuprofen (ADVIL,MOTRIN) 800 MG tablet Take  1 tablet (800 mg total) by mouth every 8 (eight) hours as needed. (Patient taking differently: Take 800 mg by mouth every 8 (eight) hours as needed for moderate pain.) 30 tablet 1  . Insulin Aspart, w/Niacinamide, (FIASP FLEXTOUCH) 100 UNIT/ML SOPN Inject 1-8 Units into the skin 3 (three) times daily. (Patient taking differently: Inject 1-12 Units into the skin 3 (three)  times daily. Per sliding scale, 1 unit for 8 grams of carbs) 5 pen 0  . insulin degludec (TRESIBA) 100 UNIT/ML SOPN FlexTouch Pen Inject 16 Units into the skin at bedtime.    . Insulin Pen Needle 31G X 5 MM MISC 1-8 Units by Does not apply route 3 (three) times daily. 100 each 0  . levonorgestrel (MIRENA) 20 MCG/24HR IUD 1 each by Intrauterine route once. Placed 06/2014    . lithium carbonate (ESKALITH) 450 MG CR tablet Take 450 mg by mouth at bedtime.    . Multiple Vitamins-Minerals (MULTIVITAMIN GUMMIES WOMENS) CHEW Chew 1 each by mouth daily.    . SYMLINPEN 60 1500 MCG/1.5ML SOPN Inject into the skin.    Marland Kitchen zolpidem (AMBIEN CR) 12.5 MG CR tablet Take by mouth.    . dexamethasone (DECADRON) 2 MG tablet Take 3 tablets the first day, 2 the second and 1 the third day 6 tablet 0  . Galcanezumab-gnlm (EMGALITY) 120 MG/ML SOAJ Inject 240 mg into the skin every 30 (thirty) days. 2 pen 0  . Galcanezumab-gnlm (EMGALITY) 120 MG/ML SOAJ Inject 120 mg into the skin every 30 (thirty) days. 1 pen 11  . pantoprazole (PROTONIX) 20 MG tablet Take 1 tablet (20 mg total) by mouth daily. 30 tablet 0   No facility-administered medications prior to visit.    PAST MEDICAL HISTORY: Past Medical History:  Diagnosis Date  . Adopted   . Cluster headache syndrome, intractable   . Depression   . Migraine without aura, with intractable migraine, so stated, without mention of status migrainosus 12/05/2012  . Ovarian cyst   . Sepsis ([place])   . STD (sexually transmitted disease)    HSV II   . Type I diabetes mellitus ([place])     PAST SURGICAL HISTORY: Past Surgical History:  Procedure Laterality Date  . APPENDECTOMY    . BUNIONECTOMY Bilateral   . CHOLECYSTECTOMY    . EYE SURGERY    . OVARIAN CYST REMOVAL      FAMILY HISTORY: Family History  Adopted: Yes  Problem Relation Age of Onset  . Diabetes Father     SOCIAL HISTORY: Social History   Socioeconomic History  . Marital status: Single    Spouse  name: Not on file  . Number of children: 0  . Years of education: Not on file  . Highest education level: Some college, no degree  Occupational History    Employer: [employer]  Tobacco Use  . Smoking status: Never Smoker  . Smokeless tobacco: Never Used  Vaping Use  . Vaping Use: Never used  Substance and Sexual Activity  . Alcohol use: Yes    Comment: seldom  . Drug use: No  . Sexual activity: Yes    Partners: Male    Birth control/protection: I.U.D.  Other Topics Concern  . Not on file  Social History Narrative   Patient works at [employer] and lives alone.    She may stay with friends at times.   She has no children and a college education.   Some tea and soda   Right handed  Social Determinants of Health   Financial Resource Strain: Not on file  Food Insecurity: Not on file  Transportation Needs: Not on file  Physical Activity: Not on file  Stress: Not on file  Social Connections: Not on file  Intimate Partner Violence: Not on file   PHYSICAL EXAM  Vitals:   07/26/20 0906  BP: (!) 142/85  Pulse: 97  Weight: 165 lb (74.8 kg)  Height: 5' 4"  (1.626 m)   Body mass index is 28.32 kg/m.  Generalized: Well developed, in no acute distress   Neurological examination  Mentation: Alert oriented to time, place, history taking. Follows all commands speech and language fluent Cranial nerve II-XII: Pupil abnormality to the left, asymmetric. Extraocular movements were full, visual field were full on confrontational test. Facial sensation and strength were normal. Head turning and shoulder shrug  were normal and symmetric. Motor: The motor testing reveals 5 over 5 strength of all 4 extremities. Good symmetric motor tone is noted throughout.  Sensory: Sensory testing is intact to soft touch on all 4 extremities. No evidence of extinction is noted.  Coordination: Cerebellar testing reveals good finger-nose-finger and heel-to-shin bilaterally.  Gait and station: Gait is  normal, steady.  Reflexes: Deep tendon reflexes are symmetric and normal bilaterally.   DIAGNOSTIC DATA (LABS, IMAGING, TESTING) - I reviewed patient records, labs, notes, testing and imaging myself where available.  Lab Results  Component Value Date   WBC 7.2 03/14/2019   HGB 12.8 03/14/2019   HCT 39.0 03/14/2019   MCV 90.7 03/14/2019   PLT 308 03/14/2019      Component Value Date/Time   NA 134 (L) 03/14/2019 2226   NA 133 (L) 11/24/2013 1449   K 4.4 03/14/2019 2226   K 4.3 11/24/2013 1449   CL 102 03/14/2019 2226   CL 103 11/24/2013 1449   CO2 22 03/14/2019 2226   CO2 24 11/24/2013 1449   GLUCOSE 281 (H) 03/14/2019 2226   GLUCOSE 228 (H) 11/24/2013 1449   BUN 12 03/14/2019 2226   BUN 8 11/24/2013 1449   CREATININE 0.65 03/14/2019 2226   CREATININE 0.90 11/24/2013 1449   CALCIUM 9.1 03/14/2019 2226   CALCIUM 9.3 11/24/2013 1449   PROT 6.6 03/14/2019 2226   PROT 7.1 11/10/2013 0033   ALBUMIN 3.5 03/14/2019 2226   ALBUMIN 2.9 (L) 11/10/2013 0033   AST 11 (L) 03/14/2019 2226   AST 20 11/10/2013 0033   ALT 13 03/14/2019 2226   ALT 11 (L) 11/10/2013 0033   ALKPHOS 44 03/14/2019 2226   ALKPHOS 76 11/10/2013 0033   BILITOT 0.6 03/14/2019 2226   BILITOT 0.5 11/10/2013 0033   GFRNONAA >60 03/14/2019 2226   GFRNONAA >60 11/24/2013 1449   GFRAA >60 03/14/2019 2226   GFRAA >60 11/24/2013 1449   No results found for: CHOL, HDL, LDLCALC, LDLDIRECT, TRIG, CHOLHDL Lab Results  Component Value Date   HGBA1C 7.5 (H) 09/17/2018   Lab Results  Component Value Date   VITAMINB12 406 09/18/2018   No results found for: TSH    ASSESSMENT AND PLAN 45 y.o. year old female  has a past medical history of Adopted, Cluster headache syndrome, intractable, Depression, Migraine without aura, with intractable migraine, so stated, without mention of status migrainosus (12/05/2012), Ovarian cyst, Sepsis ([place]), STD (sexually transmitted disease), and Type I diabetes mellitus ([name]). here  with:  1.  Migraine headache -Prolonged headache for the last 6 days, will treat with 2 mg 3-day Decadron taper, has done well with this  previously (Type 1 DM) -Continue Emgality 120 mg injection for migraine prevention, excellent benefit -Try Nurtec 75 mg as needed for acute headache, no benefit from [place] -Recommend aspirin 81 mg daily, given history of complicated migraine with neurological features -Will stay away from triptans due to history of complicated migraines -Pending sleep study with Dr. Rexene Alberts -Follow up in 6 months or sooner if needed  I spent 20 minutes of face-to-face and non-face-to-face time with patient.  This included previsit chart review, lab review, study review, order entry, electronic health record documentation, patient education.  Butler Denmark, AGNP-C, DNP 07/26/2020, 9:48 AM [facility] [address] [place], [place] [address] [phone]

## 2020-07-26 NOTE — Telephone Encounter (Signed)
error 

## 2020-08-03 ENCOUNTER — Telehealth: Payer: Self-pay | Admitting: Neurology

## 2020-08-03 ENCOUNTER — Encounter: Payer: Self-pay | Admitting: Neurology

## 2020-08-03 NOTE — Telephone Encounter (Signed)
Spoke to pt.  Has level 10 migraine, with nausea, photosensitivity.  Asking for depacon infusion.  Not pregnant, has diabetes. Has had good response with depacon before.  Last done 11-06-19, depacon 500mg  repeat if needed, phenergan 25mg  iv, toradol 30mg  iv. Will have driver.  1300 today per intrafusion.

## 2020-08-03 NOTE — Telephone Encounter (Signed)
Pt called, migraine started at 2 am today. Can I come in to get an infusion for my migraine and nausea. Would like a call from the nurse.

## 2020-08-12 ENCOUNTER — Ambulatory Visit (INDEPENDENT_AMBULATORY_CARE_PROVIDER_SITE_OTHER): Payer: BC Managed Care – PPO | Admitting: Neurology

## 2020-08-12 DIAGNOSIS — E66811 Obesity, class 1: Secondary | ICD-10-CM

## 2020-08-12 DIAGNOSIS — G4710 Hypersomnia, unspecified: Secondary | ICD-10-CM | POA: Diagnosis not present

## 2020-08-12 DIAGNOSIS — R0683 Snoring: Secondary | ICD-10-CM

## 2020-08-12 DIAGNOSIS — G4719 Other hypersomnia: Secondary | ICD-10-CM

## 2020-08-12 DIAGNOSIS — F329 Major depressive disorder, single episode, unspecified: Secondary | ICD-10-CM

## 2020-08-12 DIAGNOSIS — G4700 Insomnia, unspecified: Secondary | ICD-10-CM

## 2020-08-12 DIAGNOSIS — G4720 Circadian rhythm sleep disorder, unspecified type: Secondary | ICD-10-CM

## 2020-08-12 DIAGNOSIS — E669 Obesity, unspecified: Secondary | ICD-10-CM

## 2020-08-13 ENCOUNTER — Other Ambulatory Visit: Payer: Self-pay

## 2020-08-16 ENCOUNTER — Telehealth: Payer: Self-pay | Admitting: *Deleted

## 2020-08-16 NOTE — Telephone Encounter (Signed)
Completed Nurtec PA on Cover My Meds. Key: [id number]. Awaiting determination from [employer].

## 2020-08-17 NOTE — Telephone Encounter (Signed)
Received Nurtec approval from [facility] [employer]. Nurtec approved from 08/16/2020-08/16/2021. I faxed this to the pt's pharmacy. Received a receipt of confirmation.

## 2020-08-18 ENCOUNTER — Emergency Department (HOSPITAL_COMMUNITY): Payer: BC Managed Care – PPO

## 2020-08-18 ENCOUNTER — Emergency Department (HOSPITAL_COMMUNITY)
Admission: EM | Admit: 2020-08-18 | Discharge: 2020-08-18 | Disposition: A | Discharge status: Home / Self Care | Payer: BC Managed Care – PPO | Attending: Emergency Medicine | Admitting: Emergency Medicine

## 2020-08-18 ENCOUNTER — Other Ambulatory Visit: Payer: Self-pay

## 2020-08-18 ENCOUNTER — Encounter (HOSPITAL_COMMUNITY): Payer: Self-pay

## 2020-08-18 DIAGNOSIS — N12 Tubulo-interstitial nephritis, not specified as acute or chronic: Secondary | ICD-10-CM | POA: Diagnosis not present

## 2020-08-18 DIAGNOSIS — R000 Tachycardia, unspecified: Secondary | ICD-10-CM | POA: Insufficient documentation

## 2020-08-18 DIAGNOSIS — R109 Unspecified abdominal pain: Secondary | ICD-10-CM | POA: Diagnosis present

## 2020-08-18 DIAGNOSIS — E1010 Type 1 diabetes mellitus with ketoacidosis without coma: Secondary | ICD-10-CM | POA: Diagnosis not present

## 2020-08-18 LAB — BASIC METABOLIC PANEL
Anion gap: 9 (ref 5–15)
BUN: 16 mg/dL (ref 6–20)
CO2: 22 mmol/L (ref 22–32)
Calcium: 9.1 mg/dL (ref 8.9–10.3)
Chloride: 106 mmol/L (ref 98–111)
Creatinine, Ser: 0.73 mg/dL (ref 0.44–1.00)
GFR, Estimated: >60 mL/min (ref >60)
Glucose, Bld: 187 mg/dL — ABNORMAL HIGH (ref 70–99)
Potassium: 4.7 mmol/L (ref 3.5–5.1)
Sodium: 137 mmol/L (ref 135–145)

## 2020-08-18 LAB — URINALYSIS, ROUTINE W REFLEX MICROSCOPIC
Bilirubin Urine: NEGATIVE
Glucose, UA: NEGATIVE mg/dL
Hgb urine dipstick: NEGATIVE
Ketones, ur: NEGATIVE mg/dL
Nitrite: NEGATIVE
Protein, ur: NEGATIVE mg/dL
Specific Gravity, Urine: 1.025 (ref 1.005–1.030)
pH: 5 (ref 5.0–8.0)

## 2020-08-18 LAB — CBC WITH DIFFERENTIAL/PLATELET
Abs Immature Granulocytes: 0.01 10*3/uL (ref 0.00–0.07)
Basophils Absolute: 0.1 10*3/uL (ref 0.0–0.1)
Basophils Relative: 1 %
Eosinophils Absolute: 0.3 10*3/uL (ref 0.0–0.5)
Eosinophils Relative: 5 %
HCT: 45.8 % (ref 36.0–46.0)
Hemoglobin: 15.1 g/dL — ABNORMAL HIGH (ref 12.0–15.0)
Immature Granulocytes: 0 %
Lymphocytes Relative: 28 %
Lymphs Abs: 2 10*3/uL (ref 0.7–4.0)
MCH: 29.5 pg (ref 26.0–34.0)
MCHC: 33 g/dL (ref 30.0–36.0)
MCV: 89.6 fL (ref 80.0–100.0)
Monocytes Absolute: 0.3 10*3/uL (ref 0.1–1.0)
Monocytes Relative: 4 %
Neutro Abs: 4.5 10*3/uL (ref 1.7–7.7)
Neutrophils Relative %: 62 %
Platelets: 446 10*3/uL — ABNORMAL HIGH (ref 150–400)
RBC: 5.11 MIL/uL (ref 3.87–5.11)
RDW: 12.2 % (ref 11.5–15.5)
WBC: 7.2 10*3/uL (ref 4.0–10.5)
nRBC: 0 % (ref 0.0–0.2)

## 2020-08-18 LAB — I-STAT BETA HCG BLOOD, ED (MC, WL, AP ONLY): I-stat hCG, quantitative: <5 m[IU]/mL (ref <5)

## 2020-08-18 LAB — CBG MONITORING, ED: Glucose-Capillary: 171 mg/dL — ABNORMAL HIGH (ref 70–99)

## 2020-08-18 MED ORDER — FENTANYL CITRATE (PF) 100 MCG/2ML IJ SOLN
50.0000 ug | Freq: Once | INTRAMUSCULAR | Status: AC
  Administered 2020-08-18: 50 ug via INTRAVENOUS
  Filled 2020-08-18: qty 2

## 2020-08-18 MED ORDER — METHOCARBAMOL 500 MG PO TABS: 500.0000 mg | ORAL_TABLET | Freq: Two times a day (BID) | ORAL | 0 refills | Status: DC

## 2020-08-18 MED ORDER — CEPHALEXIN 500 MG PO CAPS: 500.0000 mg | ORAL_CAPSULE | Freq: Three times a day (TID) | ORAL | 0 refills | Status: AC

## 2020-08-18 MED ORDER — SODIUM CHLORIDE 0.9 % IV SOLN
1.0000 g | Freq: Once | INTRAVENOUS | Status: AC
  Administered 2020-08-18: 1 g via INTRAVENOUS
  Filled 2020-08-18: qty 10

## 2020-08-18 MED ORDER — DIPHENHYDRAMINE HCL 25 MG PO CAPS
25.0000 mg | ORAL_CAPSULE | Freq: Once | ORAL | Status: AC
  Administered 2020-08-18: 25 mg via ORAL
  Filled 2020-08-18: qty 1

## 2020-08-18 MED ORDER — SODIUM CHLORIDE 0.9 % IV BOLUS
1000.0000 mL | Freq: Once | INTRAVENOUS | Status: AC
  Administered 2020-08-18: 1000 mL via INTRAVENOUS

## 2020-08-18 MED ORDER — OXYCODONE-ACETAMINOPHEN 5-325 MG PO TABS
1.0000 | ORAL_TABLET | Freq: Once | ORAL | Status: AC
  Administered 2020-08-18: 1 via ORAL
  Filled 2020-08-18: qty 1

## 2020-08-18 MED ORDER — MORPHINE SULFATE (PF) 4 MG/ML IV SOLN
4.0000 mg | Freq: Once | INTRAVENOUS | Status: AC
  Administered 2020-08-18: 4 mg via INTRAVENOUS
  Filled 2020-08-18: qty 1

## 2020-08-18 NOTE — ED Provider Notes (Signed)
[facility] DEPT Provider Note   CSN: [id number] Arrival date & time: 08/18/20  0848     History Chief Complaint  Patient presents with  . Flank Pain    [name] is a 45 y.o. female with a past medical history of type 1 diabetes, prior kidney stones presenting to the ED with a chief complaint of left-sided flank pain.  Symptoms began suddenly about 7 hours ago.  She took 1 dose of ibuprofen about 5 hours ago with only minimal improvement in her symptoms.  States that she was told several years ago that she had a kidney stone in her left kidney is believes that this is what is causing her pain today.  Her prior kidney stones have passed without difficulty.  She also reports prior UTIs and kidney infections as well but states that this feels more similar to her kidney stones.  She denies any fevers but does state that she feels warm sitting in the exam room.  Denies any pelvic or vaginal complaints.  Has had a chronic cough for the past several years that has unchanged.  She denies any vomiting but does report associated nausea.  Has been having diarrhea for the past several days but states that she is able to manage this.  Denies possibility of pregnancy.  No dysuria, hematuria, abdominal pain.  HPI     Past Medical History:  Diagnosis Date  . Adopted   . Cluster headache syndrome, intractable   . Depression   . Migraine without aura, with intractable migraine, so stated, without mention of status migrainosus 12/05/2012  . Ovarian cyst   . Sepsis ([place])   . STD (sexually transmitted disease)    HSV II   . Type I diabetes mellitus [facility])     Patient Active Problem List   Diagnosis Date Noted  . Dehydration 09/17/2018  . Gastroesophageal reflux disease   . ADHD   . Volume depletion 07/21/2018  . Diabetic ketoacidosis ([place]) 07/21/2018  . Morbid obesity ([place]) 06/16/2018  . Chronic tension-type headache, intractable 06/03/2018  . Multiple thyroid  nodules 04/17/2018  . 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 DKA, type 1 ([place]) 02/14/2015  . DKA (diabetic ketoacidoses) 02/14/2015  . Thyroid nodule 02/14/2015  . Depression with anxiety 02/14/2015  . Acute kidney injury ([place]) 02/14/2015  . Leukocytosis 02/14/2015  . Increased anion gap metabolic acidosis 85/88/5027  . History of frequent urinary tract infections 01/25/2014  . Anxiety 12/04/2013  . Neuropathic pain 12/04/2013  . Recurrent UTI 12/04/2013  . Aphakia of left eye 06/23/2013  . Retinal detachment 06/23/2013  . PDR (proliferative diabetic retinopathy) ([place]) 06/23/2013  . Recurrent major depression-severe ([place]) 01/06/2013  . Generalized anxiety disorder 01/06/2013  . Common migraine with intractable migraine 12/05/2012  . IDDM (insulin dependent diabetes mellitus) ([place]) 12/05/2012  . RLQ abdominal pain 11/30/2011    Past Surgical History:  Procedure Laterality Date  . APPENDECTOMY    . BUNIONECTOMY Bilateral   . CHOLECYSTECTOMY    . EYE SURGERY    . OVARIAN CYST REMOVAL       OB History    Gravida  0   Para  0   Term  0   Preterm  0   AB  0   Living  0     SAB  0   IAB  0   Ectopic  0   Multiple  0   Live Births  0  Family History  Adopted: Yes  Problem Relation Age of Onset  . Diabetes Father     Social History   Tobacco Use  . Smoking status: Never Smoker  . Smokeless tobacco: Never Used  Vaping Use  . Vaping Use: Never used  Substance Use Topics  . Alcohol use: Yes    Comment: seldom  . Drug use: No    Home Medications Prior to Admission medications   Medication Sig Start Date End Date Taking? Authorizing Provider  albuterol (VENTOLIN HFA) 108 (90 Base) MCG/ACT inhaler Inhale 2 puffs into the lungs every 6 (six) hours as needed for shortness of breath or wheezing. 07/30/20 07/30/21 Yes [provider]  amphetamine-dextroamphetamine (ADDERALL) 20 MG tablet Take 20 mg by mouth 4  (four) times daily. 10/24/17  Yes [provider]  Armodafinil 250 MG tablet Take 250 mg by mouth daily. 08/03/20  Yes [provider]  benzonatate (TESSALON) 200 MG capsule Take 200 mg by mouth 3 (three) times daily as needed for cough. 07/30/20  Yes [provider]  calcium carbonate (TUMS - DOSED IN MG ELEMENTAL CALCIUM) 500 MG chewable tablet Chew 1 tablet by mouth daily as needed for indigestion or heartburn.   Yes [provider]  cephALEXin (KEFLEX) 500 MG capsule Take 1 capsule (500 mg total) by mouth 3 (three) times daily for 7 days. 08/18/20 08/25/20 Yes Adalena Abdulla, PA-C  clonazePAM (KLONOPIN) 1 MG tablet Take 1 mg by mouth 4 (four) times daily. For anxiety 01/09/13  Yes Niel Hummer, NP  DULoxetine (CYMBALTA) 60 MG capsule Take 60 mg by mouth daily.   Yes [provider]  gabapentin (NEURONTIN) 300 MG capsule Take 300 mg by mouth 4 (four) times daily. 08/27/18  Yes [provider]  Galcanezumab-gnlm (EMGALITY) 120 MG/ML SOAJ Inject 120 mg into the skin every 30 (thirty) days. 07/26/20  Yes Suzzanne Cloud, NP  ibuprofen (ADVIL,MOTRIN) 800 MG tablet Take 1 tablet (800 mg total) by mouth every 8 (eight) hours as needed. Patient taking differently: Take 800 mg by mouth every 8 (eight) hours as needed for moderate pain. 08/25/18  Yes Salvadore Dom, MD  Insulin Aspart, w/Niacinamide, (FIASP FLEXTOUCH) 100 UNIT/ML SOPN Inject 1-8 Units into the skin 3 (three) times daily. Patient taking differently: Inject 1-12 Units into the skin 3 (three) times daily. Per sliding scale, 1 unit for 8 grams of carbs 09/19/18  Yes Eugenie Filler, MD  insulin degludec (TRESIBA) 100 UNIT/ML SOPN FlexTouch Pen Inject 18 Units into the skin at bedtime. 09/19/18  Yes [provider]  levonorgestrel (MIRENA) 20 MCG/24HR IUD 1 each by Intrauterine route once. Placed 06/2014   Yes [provider]  meloxicam (MOBIC) 15 MG tablet Take 15 mg by mouth  daily as needed for pain. 08/09/20  Yes [provider]  methocarbamol (ROBAXIN) 500 MG tablet Take 1 tablet (500 mg total) by mouth 2 (two) times daily. 08/18/20  Yes Jaelon Gatley, PA-C  Multiple Vitamins-Minerals (MULTIVITAMIN GUMMIES WOMENS) CHEW Chew 1 each by mouth daily.   Yes [provider]  ondansetron (ZOFRAN) 4 MG tablet Take 1 tablet (4 mg total) by mouth every 8 (eight) hours as needed for nausea or vomiting. 07/26/20  Yes Suzzanne Cloud, NP  solifenacin (VESICARE) 10 MG tablet Take 10 mg by mouth daily. 08/09/20  Yes [provider]  Vitamin D, Ergocalciferol, (DRISDOL) 1.25 MG (50000 UNIT) CAPS capsule Take 50,000 Units by mouth every 7 (seven) days. 08/10/20  Yes [provider]  zolpidem (AMBIEN CR) 12.5 MG CR tablet Take 12.5 mg by mouth at bedtime.   Yes [provider]  blood glucose meter kit and supplies KIT Dispense based on patient and insurance preference. Use up to four times daily as directed. (FOR ICD-9 250.00, 250.01). 07/23/18   Kayleen Memos, DO  dexamethasone (DECADRON) 2 MG tablet Take 3 tablets the first day, 2 the second and 1 the third day Patient not taking: No sig reported 07/26/20   Suzzanne Cloud, NP  Insulin Pen Needle 31G X 5 MM MISC 1-8 Units by Does not apply route 3 (three) times daily. 09/19/18   Eugenie Filler, MD  Rimegepant Sulfate (NURTEC) 75 MG TBDP Take 75 mg by mouth as needed (take 1 at onset of headache, max is 1 tablet in 24 hours). Patient not taking: No sig reported 07/26/20   Suzzanne Cloud, NP  dicyclomine (BENTYL) 20 MG tablet Take 1 tablet (20 mg total) by mouth 2 (two) times daily. Patient not taking: Reported on 12/20/2018 04/01/18 12/20/18  Drenda Freeze, MD  insulin aspart (NOVOLOG) 100 UNIT/ML injection Inject 0-9 Units into the skin 3 (three) times daily with meals. Patient not taking: Reported on 12/20/2018 09/19/18 12/20/18  Eugenie Filler, MD  Insulin Glargine [facility]) 100  UNIT/ML SOPN Inject 0.22 mLs (22 Units total) into the skin daily. Patient not taking: Reported on 12/20/2018 09/19/18 12/20/18  Eugenie Filler, MD  misoprostol (CYTOTEC) 200 MCG tablet Place 2 tablets vaginally 6-12 hours prior to IUD insertion Patient not taking: Reported on 12/20/2018 08/25/18 12/20/18  Salvadore Dom, MD    Allergies    Sulfa antibiotics, Wellbutrin [bupropion], Amoxicillin, and Codeine  Review of Systems   Review of Systems  Constitutional: Negative for appetite change, chills and fever.  HENT: Negative for ear pain, rhinorrhea, sneezing and sore throat.   Eyes: Negative for photophobia and visual disturbance.  Respiratory: Negative for cough, chest tightness, shortness of breath and wheezing.   Cardiovascular: Negative for chest pain and palpitations.  Gastrointestinal: Positive for nausea. Negative for abdominal pain, blood in stool, constipation, diarrhea and vomiting.  Genitourinary: Positive for flank pain. Negative for dysuria, hematuria and urgency.  Musculoskeletal: Negative for myalgias.  Skin: Negative for rash.  Neurological: Negative for dizziness, weakness and light-headedness.    Physical Exam Updated Vital Signs BP 129/75 (BP Location: Right Arm)   Pulse 93   Temp 100.2 F (37.9 C) (Oral)   Resp 17   Ht 5' 4"  (1.626 m)   Wt 76.2 kg   SpO2 98%   BMI 28.84 kg/m   Physical Exam Vitals and nursing note reviewed.  Constitutional:      General: She is not in acute distress.    Appearance: She is well-developed and well-nourished.  HENT:     Head: Normocephalic and atraumatic.     Nose: Nose normal.  Eyes:     General: No scleral icterus.       Left eye: No discharge.     Extraocular Movements: EOM normal.     Conjunctiva/sclera: Conjunctivae normal.  Cardiovascular:     Rate and Rhythm: Regular rhythm. Tachycardia present.     Pulses: Intact distal pulses.     Heart sounds: Normal heart sounds. No murmur heard. No friction rub.  No gallop.   Pulmonary:     Effort: Pulmonary effort is normal. No respiratory distress.     Breath sounds: Normal breath sounds.  Abdominal:     General: Bowel sounds are normal. There is no distension.     Palpations: Abdomen is soft.     Tenderness: There is no abdominal tenderness. There is left CVA tenderness. There is no guarding.  Musculoskeletal:        General: No edema. Normal range of motion.     Cervical back: Normal range of motion and neck supple.  Skin:    General: Skin is warm and dry.     Findings: No rash.  Neurological:     Mental Status: She is alert.     Motor: No abnormal muscle tone.     Coordination: Coordination normal.  Psychiatric:        Mood and Affect: Mood and affect normal.     ED Results / Procedures / Treatments   Labs (all labs ordered are listed, but only abnormal results are displayed) Labs Reviewed  URINALYSIS, ROUTINE W REFLEX MICROSCOPIC - Abnormal; Notable for the following components:      Result Value   APPearance HAZY (*)    Leukocytes,Ua TRACE (*)    Bacteria, UA MANY (*)    All other components within normal limits  BASIC METABOLIC PANEL - Abnormal; Notable for the following components:   Glucose, Bld 187 (*)    All other components within normal limits  CBC WITH DIFFERENTIAL/PLATELET - Abnormal; Notable for the following components:   Hemoglobin 15.1 (*)    Platelets 446 (*)    All other components within normal limits  CBG MONITORING, ED - Abnormal; Notable for the following components:   Glucose-Capillary 171 (*)    All other components within normal limits  URINE CULTURE  I-STAT BETA HCG BLOOD, ED ([facility], [facility], [facility] ONLY)    EKG None  Radiology CT Renal Stone Study  Result Date: 08/18/2020 CLINICAL DATA:  Left flank pain.  History of kidney stones. EXAM: CT ABDOMEN AND PELVIS WITHOUT CONTRAST TECHNIQUE: Multidetector CT imaging of the abdomen and pelvis was performed following the standard protocol without IV contrast.  COMPARISON:  CT abdomen and pelvis 03/14/2019 and 12/18/2018. FINDINGS: Lower chest: Lung bases are clear. No pleural or pericardial effusion. Hepatobiliary: No focal liver abnormality is seen. Status post cholecystectomy. No biliary dilatation. Pancreas: Unremarkable. No pancreatic ductal dilatation or surrounding inflammatory changes. Spleen: Normal in size without focal abnormality. Adrenals/Urinary Tract: The adrenal glands appear normal. No hydronephrosis or ureteral stones on the right or left. Punctate nonobstructing stone is seen in the lower pole of the left kidney. Urinary bladder is negative. Stomach/Bowel: Stomach is within normal limits. Status post appendectomy. No evidence of bowel wall thickening, distention, or inflammatory changes. Vascular/Lymphatic: No significant vascular findings are present. No enlarged abdominal or pelvic lymph nodes. Reproductive: Uterus and bilateral adnexa are unremarkable. IUD is in place. Other: None. Musculoskeletal: Unilateral pars interarticularis defect at L5 on the left is unchanged. No secondary anterolisthesis L5 on S1. No lytic or sclerotic lesion. IMPRESSION: No acute abnormality. Punctate nonobstructing stone lower pole left kidney. 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 Electronically Signed   By: Inge Rise M.D.   On: 08/18/2020 11:12    Procedures Procedures   Medications Ordered in ED Medications  oxyCODONE-acetaminophen (PERCOCET/ROXICET) 5-325 MG per tablet 1 tablet (has no administration in time range)  sodium chloride 0.9 % bolus 1,000 mL (0 mLs Intravenous Stopped 08/18/20 1143)  morphine 4 MG/ML injection 4 mg (4 mg Intravenous Given 08/18/20 1027)  cefTRIAXone (ROCEPHIN) 1  g in sodium chloride 0.9 % 100 mL IVPB (0 g Intravenous Stopped 08/18/20 1314)  fentaNYL (SUBLIMAZE) injection 50 mcg (50 mcg Intravenous Given 08/18/20 1141)  diphenhydrAMINE (BENADRYL) capsule 25 mg (25 mg Oral  Given 08/18/20 1204)    ED Course  I have reviewed the triage vital signs and the nursing notes.  Pertinent labs & imaging results that were available during my care of the patient were reviewed by me and considered in my medical decision making (see chart for details).  Clinical Course as of 08/18/20 1354  Thu Aug 18, 2020  1013 Leukocytes,Ua(!): TRACE [HK]  1013 Bacteria, UA(!): MANY [HK]  1039 WBC: 7.2 [HK]  1058 Creatinine: 0.73 [HK]  1121 CT Renal Stone Study No obstructing stone. [HK]  1122 Patient reports itching after fentanyl was given which she states is happened to her before.  On exam at the bedside patient without any rash, evidence of angioedema or anaphylaxis.  Will give oral Benadryl and reassess. [HK]  1157 Anion gap: 9 [HK]  3810 CO2: 22 [HK]  1751 Prior urine culture showed multiple species were no growth. [HK]    Clinical Course User Index [HK] Delia Heady, PA-C   MDM Rules/Calculators/A&P                          45 year old female with a past medical history of type 1 diabetes, prior kidney stones presenting to the ED with a chief complaint of left-sided flank pain that began about 7 hours ago.  Minimal improvement noted with 1 dose of ibuprofen.  States this feels similar to her prior kidney stones.  Also reports history of UTI and kidney infections.  No fevers.  Denies any pelvic, vaginal complaints, vomiting.  Reports nausea and diarrhea.  Denies possibility of pregnancy.  No dysuria, hematuria or abdominal pain.  On exam patient initially tachycardic which I feel is secondary to her discomfort.  She has a temperature of 100.2 orally.  She has left CVA tenderness on exam.  Abdomen is soft and nontender.  Pain was controlled here and she was given IV fluids as well.  Urinalysis has trace leukocytes, many bacteria and few squamous cells.  No leukocytosis.  hCG is negative.  CBG is 171 without any abnormalities on her BMP.  CT renal stone study without any evidence of  obstructing stone or other acute abnormality.  Suspect her symptoms could be due to a pyelonephritis in the setting of the location of her pain and UTI as well as her history of the same.  Will give IV antibiotics here and reassess.  Patient symptoms controlled here.  Given a dose of Rocephin.  I had a discussion with the patient regarding discharge home with outpatient treatment and she agrees.  Past urine cultures have shown multiple species or no growth.  Will treat with Keflex every 8 hours for 7 days as recommended by pharmacist.  Will send urine for culture.  Her tachycardia has improved here.  Return precautions given.   Patient is hemodynamically stable, in NAD, and able to ambulate in the ED. Evaluation does not show pathology that would require ongoing emergent intervention or inpatient treatment. I explained the diagnosis to the patient. Pain has been managed and has no complaints prior to discharge. Patient is comfortable with above plan and is stable for discharge at this time. All questions were answered prior to disposition. Strict return precautions for returning to the ED were discussed. Encouraged follow  up with PCP.   An After Visit Summary was printed and given to the patient.   Portions of this note were generated with [employer]. Dictation errors may occur despite best attempts at proofreading.  Final Clinical Impression(s) / ED Diagnoses Final diagnoses:  Pyelonephritis    Rx / DC Orders ED Discharge Orders         Ordered    methocarbamol (ROBAXIN) 500 MG tablet  2 times daily        08/18/20 1352    cephALEXin (KEFLEX) 500 MG capsule  3 times daily        08/18/20 1354           Delia Heady, PA-C 08/18/20 1355    Gareth Morgan, MD 08/19/20 [phone]

## 2020-08-18 NOTE — Progress Notes (Signed)
Inpatient Diabetes Program Recommendations  AACE/ADA: New Consensus Statement on Inpatient Glycemic Control (2015)  Target Ranges:  Prepandial:   less than 140 mg/dL      Peak postprandial:   less than 180 mg/dL (1-2 hours)      Critically ill patients:  140 - 180 mg/dL   Lab Results  Component Value Date   GLUCAP 171 (H) 08/18/2020   HGBA1C 7.5 (H) 09/17/2018    Review of Glycemic Control Results for [name], [name] (MRN [id number]) as of 08/18/2020 11:54  Ref. Range 08/18/2020 10:05  Glucose-Capillary Latest Ref Range: 70 - 99 mg/dL 553 (H)   Diabetes history:  DM1(does not make insulin.  Needs correction, basal and meal coverage) Outpatient Diabetes medications:  Tresiba 18 units QHS Fiasp 0-12 units TID Fiasp 1 unit : 8 CHO's Symlin 2-3 a day with meals Current orders for Inpatient glycemic control:  None  Inpatient Diabetes Program Recommendations:    -Please order glycemic control order set using Novolog 0-9 units Q4H as pt is NPO.   -Lantus 14 units QHS  Spoke with patient at bedside.  She confirms above home medications.  She has had T1DM for 39 yrs.  She recently switched from a Freestyle Libre CGM to the Dexcom G6.  She is current with Dr. Shawnee Knapp with [facility].  Takes her insulins as prescribed and denies difficulty obtaining insulins or DM supplies.  Has occasional hypoglycemia.  She last took her Evaristo Bury last evening.  Will need basal insulin this evening if remains in ED/inpatient.    Will continue to follow while inpatient.  Thank you, Dulce Sellar, RN, BSN Diabetes Coordinator Inpatient Diabetes Program [phone] (team pager from 8a-5p)

## 2020-08-18 NOTE — ED Triage Notes (Signed)
Pt presents with c/o left side flank pain. Pt reports hx of kidney stone. She says that she has had the kidney stone for 4 years and it is now starting to move. Pt denies any hematuria.

## 2020-08-18 NOTE — Discharge Instructions (Addendum)
Take the antibiotics as prescribed for 1 week. Follow-up with your primary care provider. Return to the ER if you start to experience worsening pain, vomiting, chest pain or shortness of breath.

## 2020-08-19 LAB — URINE CULTURE

## 2020-08-20 ENCOUNTER — Other Ambulatory Visit: Payer: Self-pay

## 2020-08-20 ENCOUNTER — Encounter (HOSPITAL_COMMUNITY): Payer: Self-pay | Admitting: Emergency Medicine

## 2020-08-20 ENCOUNTER — Emergency Department (HOSPITAL_COMMUNITY): Payer: BC Managed Care – PPO

## 2020-08-20 ENCOUNTER — Emergency Department (HOSPITAL_COMMUNITY)
Admission: EM | Admit: 2020-08-20 | Discharge: 2020-08-20 | Disposition: A | Discharge status: Home / Self Care | Payer: BC Managed Care – PPO | Attending: Emergency Medicine | Admitting: Emergency Medicine

## 2020-08-20 DIAGNOSIS — R109 Unspecified abdominal pain: Secondary | ICD-10-CM | POA: Diagnosis present

## 2020-08-20 DIAGNOSIS — K219 Gastro-esophageal reflux disease without esophagitis: Secondary | ICD-10-CM | POA: Diagnosis not present

## 2020-08-20 DIAGNOSIS — Z20822 Contact with and (suspected) exposure to covid-19: Secondary | ICD-10-CM | POA: Insufficient documentation

## 2020-08-20 DIAGNOSIS — Z794 Long term (current) use of insulin: Secondary | ICD-10-CM | POA: Insufficient documentation

## 2020-08-20 DIAGNOSIS — R110 Nausea: Secondary | ICD-10-CM

## 2020-08-20 DIAGNOSIS — E1010 Type 1 diabetes mellitus with ketoacidosis without coma: Secondary | ICD-10-CM | POA: Diagnosis not present

## 2020-08-20 DIAGNOSIS — N12 Tubulo-interstitial nephritis, not specified as acute or chronic: Secondary | ICD-10-CM | POA: Insufficient documentation

## 2020-08-20 DIAGNOSIS — R000 Tachycardia, unspecified: Secondary | ICD-10-CM | POA: Diagnosis not present

## 2020-08-20 LAB — CBC WITH DIFFERENTIAL/PLATELET
Abs Immature Granulocytes: 0.02 10*3/uL (ref 0.00–0.07)
Basophils Absolute: 0.1 10*3/uL (ref 0.0–0.1)
Basophils Relative: 1 %
Eosinophils Absolute: 0.3 10*3/uL (ref 0.0–0.5)
Eosinophils Relative: 4 %
HCT: 40.7 % (ref 36.0–46.0)
Hemoglobin: 13.5 g/dL (ref 12.0–15.0)
Immature Granulocytes: 0 %
Lymphocytes Relative: 19 %
Lymphs Abs: 1.4 10*3/uL (ref 0.7–4.0)
MCH: 29.6 pg (ref 26.0–34.0)
MCHC: 33.2 g/dL (ref 30.0–36.0)
MCV: 89.3 fL (ref 80.0–100.0)
Monocytes Absolute: 0.4 10*3/uL (ref 0.1–1.0)
Monocytes Relative: 5 %
Neutro Abs: 5.1 10*3/uL (ref 1.7–7.7)
Neutrophils Relative %: 71 %
Platelets: 404 10*3/uL — ABNORMAL HIGH (ref 150–400)
RBC: 4.56 MIL/uL (ref 3.87–5.11)
RDW: 12.5 % (ref 11.5–15.5)
WBC: 7.2 10*3/uL (ref 4.0–10.5)
nRBC: 0 % (ref 0.0–0.2)

## 2020-08-20 LAB — COMPREHENSIVE METABOLIC PANEL
ALT: 12 U/L (ref 0–44)
AST: 22 U/L (ref 15–41)
Albumin: 4.2 g/dL (ref 3.5–5.0)
Alkaline Phosphatase: 68 U/L (ref 38–126)
Anion gap: 9 (ref 5–15)
BUN: 16 mg/dL (ref 6–20)
CO2: 21 mmol/L — ABNORMAL LOW (ref 22–32)
Calcium: 9.1 mg/dL (ref 8.9–10.3)
Chloride: 106 mmol/L (ref 98–111)
Creatinine, Ser: 0.67 mg/dL (ref 0.44–1.00)
GFR, Estimated: >60 mL/min (ref >60)
Glucose, Bld: 183 mg/dL — ABNORMAL HIGH (ref 70–99)
Potassium: 4.3 mmol/L (ref 3.5–5.1)
Sodium: 136 mmol/L (ref 135–145)
Total Bilirubin: 0.5 mg/dL (ref 0.3–1.2)
Total Protein: 7.7 g/dL (ref 6.5–8.1)

## 2020-08-20 LAB — URINALYSIS, ROUTINE W REFLEX MICROSCOPIC
Bilirubin Urine: NEGATIVE
Glucose, UA: >=500 mg/dL — AB
Ketones, ur: NEGATIVE mg/dL
Leukocytes,Ua: NEGATIVE
Nitrite: NEGATIVE
Protein, ur: NEGATIVE mg/dL
Specific Gravity, Urine: 1.021 (ref 1.005–1.030)
pH: 5 (ref 5.0–8.0)

## 2020-08-20 LAB — LIPASE, BLOOD: Lipase: 25 U/L (ref 11–51)

## 2020-08-20 LAB — LACTIC ACID, PLASMA: Lactic Acid, Venous: 0.9 mmol/L (ref 0.5–1.9)

## 2020-08-20 LAB — RESP PANEL BY RT-PCR (FLU A&B, COVID) ARPGX2
Influenza A by PCR: NEGATIVE
Influenza B by PCR: NEGATIVE
SARS Coronavirus 2 by RT PCR: NEGATIVE

## 2020-08-20 MED ORDER — MORPHINE SULFATE (PF) 4 MG/ML IV SOLN
4.0000 mg | Freq: Once | INTRAVENOUS | Status: AC
  Administered 2020-08-20: 4 mg via INTRAVENOUS
  Filled 2020-08-20: qty 1

## 2020-08-20 MED ORDER — DIPHENHYDRAMINE HCL 50 MG/ML IJ SOLN
25.0000 mg | Freq: Once | INTRAMUSCULAR | Status: AC
  Administered 2020-08-20: 25 mg via INTRAVENOUS
  Filled 2020-08-20: qty 1

## 2020-08-20 MED ORDER — ONDANSETRON HCL 4 MG/2ML IJ SOLN
4.0000 mg | Freq: Once | INTRAMUSCULAR | Status: AC
  Administered 2020-08-20: 4 mg via INTRAVENOUS
  Filled 2020-08-20: qty 2

## 2020-08-20 MED ORDER — SODIUM CHLORIDE 0.9 % IV BOLUS
1000.0000 mL | Freq: Once | INTRAVENOUS | Status: AC
  Administered 2020-08-20: 1000 mL via INTRAVENOUS

## 2020-08-20 MED ORDER — MORPHINE SULFATE (PF) 4 MG/ML IV SOLN
4.0000 mg | Freq: Once | INTRAVENOUS | Status: AC
Start: 2020-08-20 — End: 2020-08-20
  Administered 2020-08-20: 4 mg via INTRAVENOUS
  Filled 2020-08-20: qty 1

## 2020-08-20 MED ORDER — OXYCODONE-ACETAMINOPHEN 5-325 MG PO TABS: 1.0000 | ORAL_TABLET | ORAL | 0 refills | Status: DC | PRN

## 2020-08-20 MED ORDER — ONDANSETRON HCL 4 MG PO TABS: 4.0000 mg | ORAL_TABLET | Freq: Three times a day (TID) | ORAL | 0 refills | Status: DC | PRN

## 2020-08-20 MED ORDER — LEVOFLOXACIN 750 MG PO TABS: 750.0000 mg | ORAL_TABLET | Freq: Every day | ORAL | 0 refills | Status: AC

## 2020-08-20 NOTE — ED Triage Notes (Signed)
Patient is complaining of right and left flank pain. Patient states she has a hx of kidney stones.

## 2020-08-20 NOTE — Discharge Instructions (Addendum)
Your history and exam are consistent with continued pyelonephritis.  Your laboratory testing and work-up today were overall reassuring however, given your persistent and worsening symptoms, we discussed the possibly of admission versus changing to a different antibiotic.  Given your vital signs being reassuring and your otherwise improved appearance, we feel it is reasonable to try a different antibiotic and close follow-up with rest.  We will give you Levaquin which your previous urine culture was sensitive to.  Please maintain hydration and use the pain and nausea medicine.  Please follow-up with your PCP.  If any symptoms change acutely or you cannot maintain hydration at home, please return to the nearest emergency department immediately.

## 2020-08-20 NOTE — ED Provider Notes (Signed)
[facility] DEPT Provider Note   CSN: [id number] Arrival date & time: 08/20/20  5329     History No chief complaint on file.   [name] is a 45 y.o. female.  The history is provided by the patient and medical records. No language interpreter was used.  Flank Pain This is a new problem. The current episode started more than 2 days ago. The problem occurs constantly. The problem has been gradually worsening. Associated symptoms include abdominal pain. Pertinent negatives include no chest pain, no headaches and no shortness of breath. Nothing aggravates the symptoms. Nothing relieves the symptoms. She has tried nothing for the symptoms. The treatment provided no relief.       Past Medical History:  Diagnosis Date  . Adopted   . Cluster headache syndrome, intractable   . Depression   . Migraine without aura, with intractable migraine, so stated, without mention of status migrainosus 12/05/2012  . Ovarian cyst   . Sepsis ([place])   . STD (sexually transmitted disease)    HSV II   . Type I diabetes mellitus [facility])     Patient Active Problem List   Diagnosis Date Noted  . Dehydration 09/17/2018  . Gastroesophageal reflux disease   . ADHD   . Volume depletion 07/21/2018  . Diabetic ketoacidosis ([place]) 07/21/2018  . Morbid obesity ([place]) 06/16/2018  . Chronic tension-type headache, intractable 06/03/2018  . Multiple thyroid nodules 04/17/2018  . 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 ([place]) 01/01/2018  . DKA, type 1 ([place]) 02/14/2015  . DKA (diabetic ketoacidoses) 02/14/2015  . Thyroid nodule 02/14/2015  . Depression with anxiety 02/14/2015  . Acute kidney injury ([place]) 02/14/2015  . Leukocytosis 02/14/2015  . Increased anion gap metabolic acidosis 92/42/6834  . History of frequent urinary tract infections 01/25/2014  . Anxiety 12/04/2013  . Neuropathic pain 12/04/2013  . Recurrent UTI 12/04/2013  . Aphakia of left eye 06/23/2013  .  Retinal detachment 06/23/2013  . PDR (proliferative diabetic retinopathy) ([place]) 06/23/2013  . Recurrent major depression-severe ([place]) 01/06/2013  . Generalized anxiety disorder 01/06/2013  . Common migraine with intractable migraine 12/05/2012  . IDDM (insulin dependent diabetes mellitus) ([place]) 12/05/2012  . RLQ abdominal pain 11/30/2011    Past Surgical History:  Procedure Laterality Date  . APPENDECTOMY    . BUNIONECTOMY Bilateral   . CHOLECYSTECTOMY    . EYE SURGERY    . OVARIAN CYST REMOVAL       OB History    Gravida  0   Para  0   Term  0   Preterm  0   AB  0   Living  0     SAB  0   IAB  0   Ectopic  0   Multiple  0   Live Births  0           Family History  Adopted: Yes  Problem Relation Age of Onset  . Diabetes Father     Social History   Tobacco Use  . Smoking status: Never Smoker  . Smokeless tobacco: Never Used  Vaping Use  . Vaping Use: Never used  Substance Use Topics  . Alcohol use: Yes    Comment: seldom  . Drug use: No    Home Medications Prior to Admission medications   Medication Sig Start Date End Date Taking? Authorizing Provider  albuterol (VENTOLIN HFA) 108 (90 Base) MCG/ACT inhaler Inhale 2 puffs into the lungs every 6 (six) hours as  needed for shortness of breath or wheezing. 07/30/20 07/30/21  [provider]  amphetamine-dextroamphetamine (ADDERALL) 20 MG tablet Take 20 mg by mouth 4 (four) times daily. 10/24/17   [provider]  Armodafinil 250 MG tablet Take 250 mg by mouth daily. 08/03/20   [provider]  benzonatate (TESSALON) 200 MG capsule Take 200 mg by mouth 3 (three) times daily as needed for cough. 07/30/20   [provider]  blood glucose meter kit and supplies KIT Dispense based on patient and insurance preference. Use up to four times daily as directed. (FOR ICD-9 250.00, 250.01). 07/23/18   Kayleen Memos, DO  calcium carbonate (TUMS - DOSED IN MG ELEMENTAL CALCIUM) 500  MG chewable tablet Chew 1 tablet by mouth daily as needed for indigestion or heartburn.    [provider]  cephALEXin (KEFLEX) 500 MG capsule Take 1 capsule (500 mg total) by mouth 3 (three) times daily for 7 days. 08/18/20 08/25/20  Khatri, Hina, PA-C  clonazePAM (KLONOPIN) 1 MG tablet Take 1 mg by mouth 4 (four) times daily. For anxiety 01/09/13   Niel Hummer, NP  dexamethasone (DECADRON) 2 MG tablet Take 3 tablets the first day, 2 the second and 1 the third day Patient not taking: No sig reported 07/26/20   Suzzanne Cloud, NP  DULoxetine (CYMBALTA) 60 MG capsule Take 60 mg by mouth daily.    [provider]  gabapentin (NEURONTIN) 300 MG capsule Take 300 mg by mouth 4 (four) times daily. 08/27/18   [provider]  Galcanezumab-gnlm (EMGALITY) 120 MG/ML SOAJ Inject 120 mg into the skin every 30 (thirty) days. 07/26/20   Suzzanne Cloud, NP  ibuprofen (ADVIL,MOTRIN) 800 MG tablet Take 1 tablet (800 mg total) by mouth every 8 (eight) hours as needed. Patient taking differently: Take 800 mg by mouth every 8 (eight) hours as needed for moderate pain. 08/25/18   Salvadore Dom, MD  Insulin Aspart, w/Niacinamide, (FIASP FLEXTOUCH) 100 UNIT/ML SOPN Inject 1-8 Units into the skin 3 (three) times daily. Patient taking differently: Inject 1-12 Units into the skin 3 (three) times daily. Per sliding scale, 1 unit for 8 grams of carbs 09/19/18   Eugenie Filler, MD  insulin degludec (TRESIBA) 100 UNIT/ML SOPN FlexTouch Pen Inject 18 Units into the skin at bedtime. 09/19/18   [provider]  Insulin Pen Needle 31G X 5 MM MISC 1-8 Units by Does not apply route 3 (three) times daily. 09/19/18   Eugenie Filler, MD  levonorgestrel (MIRENA) 20 MCG/24HR IUD 1 each by Intrauterine route once. Placed 06/2014    [provider]  meloxicam (MOBIC) 15 MG tablet Take 15 mg by mouth daily as needed for pain. 08/09/20   [provider]  methocarbamol (ROBAXIN) 500  MG tablet Take 1 tablet (500 mg total) by mouth 2 (two) times daily. 08/18/20   Delia Heady, PA-C  Multiple Vitamins-Minerals (MULTIVITAMIN GUMMIES WOMENS) CHEW Chew 1 each by mouth daily.    [provider]  ondansetron (ZOFRAN) 4 MG tablet Take 1 tablet (4 mg total) by mouth every 8 (eight) hours as needed for nausea or vomiting. 07/26/20   Suzzanne Cloud, NP  Rimegepant Sulfate (NURTEC) 75 MG TBDP Take 75 mg by mouth as needed (take 1 at onset of headache, max is 1 tablet in 24 hours). Patient not taking: No sig reported 07/26/20   Suzzanne Cloud, NP  solifenacin (VESICARE) 10 MG tablet Take 10 mg by mouth  daily. 08/09/20   [provider]  Vitamin D, Ergocalciferol, (DRISDOL) 1.25 MG (50000 UNIT) CAPS capsule Take 50,000 Units by mouth every 7 (seven) days. 08/10/20   [provider]  zolpidem (AMBIEN CR) 12.5 MG CR tablet Take 12.5 mg by mouth at bedtime.    [provider]  dicyclomine (BENTYL) 20 MG tablet Take 1 tablet (20 mg total) by mouth 2 (two) times daily. Patient not taking: Reported on 12/20/2018 04/01/18 12/20/18  Drenda Freeze, MD  insulin aspart (NOVOLOG) 100 UNIT/ML injection Inject 0-9 Units into the skin 3 (three) times daily with meals. Patient not taking: Reported on 12/20/2018 09/19/18 12/20/18  Eugenie Filler, MD  Insulin Glargine [facility]) 100 UNIT/ML SOPN Inject 0.22 mLs (22 Units total) into the skin daily. Patient not taking: Reported on 12/20/2018 09/19/18 12/20/18  Eugenie Filler, MD  misoprostol (CYTOTEC) 200 MCG tablet Place 2 tablets vaginally 6-12 hours prior to IUD insertion Patient not taking: Reported on 12/20/2018 08/25/18 12/20/18  Salvadore Dom, MD    Allergies    Sulfa antibiotics, Wellbutrin [bupropion], Amoxicillin, and Codeine  Review of Systems   Review of Systems  Constitutional: Positive for chills, fatigue and fever. Negative for diaphoresis.  HENT: Negative for congestion.   Eyes: Negative for  visual disturbance.  Respiratory: Negative for chest tightness, shortness of breath and wheezing.   Cardiovascular: Negative for chest pain.  Gastrointestinal: Positive for abdominal pain and nausea. Negative for constipation, diarrhea and vomiting.  Genitourinary: Positive for flank pain. Negative for frequency.       Foul smell to urine now  Musculoskeletal: Positive for back pain. Negative for neck pain and neck stiffness.  Skin: Negative for rash and wound.  Neurological: Negative for light-headedness and headaches.  Psychiatric/Behavioral: Negative for agitation.  All other systems reviewed and are negative.   Physical Exam Updated Vital Signs BP (!) 154/90 (BP Location: Left Arm)   Pulse (!) 107   Temp 99.1 F (37.3 C) (Oral)   Resp 17   SpO2 100%   Physical Exam Vitals and nursing note reviewed.  Constitutional:      General: She is not in acute distress.    Appearance: She is well-developed and well-nourished. She is not ill-appearing, toxic-appearing or diaphoretic.  HENT:     Head: Normocephalic and atraumatic.     Nose: Nose normal.     Mouth/Throat:     Mouth: Mucous membranes are dry.     Pharynx: No oropharyngeal exudate or posterior oropharyngeal erythema.  Eyes:     Conjunctiva/sclera: Conjunctivae normal.     Pupils: Pupils are equal, round, and reactive to light.  Cardiovascular:     Rate and Rhythm: Regular rhythm. Tachycardia present.     Heart sounds: No murmur heard.   Pulmonary:     Effort: Pulmonary effort is normal. No respiratory distress.     Breath sounds: Normal breath sounds. No wheezing, rhonchi or rales.  Chest:     Chest wall: No tenderness.  Abdominal:     General: Abdomen is flat. There is no distension.     Palpations: Abdomen is soft.     Tenderness: There is no abdominal tenderness. There is right CVA tenderness and left CVA tenderness. There is no guarding or rebound.    Musculoskeletal:        General: Tenderness present. No  edema.     Cervical back: Neck supple.       Back:     Right  lower leg: No edema.     Left lower leg: No edema.  Skin:    General: Skin is warm and dry.     Capillary Refill: Capillary refill takes less than 2 seconds.     Findings: No erythema.  Neurological:     General: No focal deficit present.     Mental Status: She is alert.  Psychiatric:        Mood and Affect: Mood and affect and mood normal.     ED Results / Procedures / Treatments   Labs (all labs ordered are listed, but only abnormal results are displayed) Labs Reviewed  COMPREHENSIVE METABOLIC PANEL - Abnormal; Notable for the following components:      Result Value   CO2 21 (*)    Glucose, Bld 183 (*)    All other components within normal limits  URINALYSIS, ROUTINE W REFLEX MICROSCOPIC - Abnormal; Notable for the following components:   Glucose, UA >=500 (*)    Hgb urine dipstick SMALL (*)    Bacteria, UA MANY (*)    All other components within normal limits  CBC WITH DIFFERENTIAL/PLATELET - Abnormal; Notable for the following components:   Platelets 404 (*)    All other components within normal limits  RESP PANEL BY RT-PCR (FLU A&B, COVID) [id number]  URINE CULTURE  CULTURE, BLOOD (ROUTINE X 2)  CULTURE, BLOOD (ROUTINE X 2)  LACTIC ACID, PLASMA  LIPASE, BLOOD  CBC WITH DIFFERENTIAL/PLATELET  LACTIC ACID, PLASMA    EKG None  Radiology US Renal  Result Date: 08/20/2020 CLINICAL DATA:  Left flank pain. EXAM: RENAL / URINARY TRACT ULTRASOUND COMPLETE COMPARISON:  CT scan August 18, 2020 FINDINGS: Right Kidney: Renal measurements: 10.8 x 4.9 x 4.6 cm = volume: 127 mL. Echogenicity within normal limits. No mass or hydronephrosis visualized. Left Kidney: Renal measurements: 10.7 x 5.7 x 4.4 cm = volume: 139 mL. Echogenicity within normal limits. No mass or hydronephrosis visualized. Bladder: Poorly evaluated due to lack of distention. Other: None. IMPRESSION: 1. The kidneys are normal in appearance.  No  obstruction. 2. The bladder is poorly evaluated due to lack of distention. Electronically Signed   By: Dorise Bullion III M.D   On: 08/20/2020 09:04    Procedures Procedures   Medications Ordered in ED Medications  diphenhydrAMINE (BENADRYL) injection 25 mg (25 mg Intravenous Given 08/20/20 0912)  morphine 4 MG/ML injection 4 mg (4 mg Intravenous Given 08/20/20 0920)  ondansetron (ZOFRAN) injection 4 mg (4 mg Intravenous Given 08/20/20 0912)  sodium chloride 0.9 % bolus 1,000 mL (0 mLs Intravenous Stopped 08/20/20 1007)  morphine 4 MG/ML injection 4 mg (4 mg Intravenous Given 08/20/20 1228)  ondansetron (ZOFRAN) injection 4 mg (4 mg Intravenous Given 08/20/20 1228)    ED Course  I have reviewed the triage vital signs and the nursing notes.  Pertinent labs & imaging results that were available during my care of the patient were reviewed by me and considered in my medical decision making (see chart for details).    MDM Rules/Calculators/A&P                          [name] is a 45 y.o. 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  Patient reports that several days  ago, she was diagnosed with pyelonephritis with severe left-sided flank pain initially.  She reports that she took the antibiotics and has been treating it at home but over the last 2 days has had worsened symptoms with more nausea, decreased appetite, now a foul-smelling urine, and now bilateral flank pain that is 10 out of 10 in severity.  [name] reports that she is concerned she is developing sepsis which she has had in the past from a renal source.  On exam, lungs are clear and chest is nontender.  Abdomen is nontender on the front side but is tender in her bilateral  flanks.  Both CVA areas are exquisitely tender.  No rashes seen.  Bowel sounds were appreciated.  She denied any constipation or diarrhea.  She denied any actual pelvic pain or pelvic symptoms reported.  She was slightly tachycardic on monitor evaluation and temperature was 9.1.  She is warm to the touch.  She was not hypotensive.  I had a shared decision-making conversation with patient we agreed to get an ultrasound to look for development of hydronephrosis or other concerning findings on the ultrasound but we will get some screening labs and evaluate for worsened pyelonephritis.  We will get labs and give her pain medicine, nausea medicine, and fluids.  As she had a CT scan 2 days ago, we agreed to hold on repeat CT initially.  Anticipate shared decision-making conversation to discuss determine disposition after this is completed.   Of note, she does report that she typically gets mild itching with injected pain medication so we will give her a dose of Benadryl with it at her request.   2:30 PM Patient's work-up began to return.  Her ultrasound showed no acute abnormalities.  Her labs were also overall reassuring.  I had a shared decision-making conversation with patient about management.  She reports she is feeling somewhat better after the medication, and fluids.  We discussed the possible admission for IV antibiotics however we agreed that it is reasonable to try pivoting to a different antibiotic and have her take it for the next few days and return if symptoms change or worsen.  Patient's vital signs were reassuring and she did not appear to be septic.  Patient is agreeable to trying Levaquin which she was sensitive to on previous urine culture.  Patient given prescription for pain medicine and nausea medicine and will be discharged home.  She understands return precautions and follow-up instructions and was discharged in good condition.    Final Clinical Impression(s) / ED Diagnoses Final  diagnoses:  Nausea  Pyelonephritis  Bilateral flank pain    Rx / DC Orders ED Discharge Orders         Ordered    levofloxacin (LEVAQUIN) 750 MG tablet  Daily        08/20/20 1429    ondansetron (ZOFRAN) 4 MG tablet  Every 8 hours PRN        08/20/20 1429    oxyCODONE-acetaminophen (PERCOCET/ROXICET) 5-325 MG tablet  Every 4 hours PRN        08/20/20 1429          Clinical Impression: 1. Nausea   2. Pyelonephritis   3. Bilateral flank pain     Disposition: Discharge  Condition: Good  I have discussed the results, Dx and Tx plan with the pt(& family if present). He/she/they expressed understanding and agree(s) with the plan. Discharge instructions discussed at great length. Strict return precautions discussed and pt &/or family have  verbalized understanding of the instructions. No further questions at time of discharge.    New Prescriptions   LEVOFLOXACIN (LEVAQUIN) 750 MG TABLET    Take 1 tablet (750 mg total) by mouth daily for 5 days.   ONDANSETRON (ZOFRAN) 4 MG TABLET    Take 1 tablet (4 mg total) by mouth every 8 (eight) hours as needed for nausea or vomiting.   OXYCODONE-ACETAMINOPHEN (PERCOCET/ROXICET) 5-325 MG TABLET    Take 1 tablet by mouth every 4 (four) hours as needed for severe pain.    Follow Up: [facility] [place] [id number] [facility] [place] [id number] mc [address] [place]       Jadda Hunsucker, Gwenyth Allegra, MD 08/20/20 [phone]

## 2020-08-21 LAB — URINE CULTURE

## 2020-08-22 ENCOUNTER — Telehealth: Payer: Self-pay | Admitting: Emergency Medicine

## 2020-08-22 ENCOUNTER — Emergency Department (HOSPITAL_COMMUNITY)
Admission: EM | Admit: 2020-08-22 | Discharge: 2020-08-22 | Disposition: A | Discharge status: Home / Self Care | Payer: BC Managed Care – PPO | Attending: Emergency Medicine | Admitting: Emergency Medicine

## 2020-08-22 ENCOUNTER — Other Ambulatory Visit: Payer: Self-pay

## 2020-08-22 ENCOUNTER — Emergency Department (HOSPITAL_COMMUNITY): Payer: BC Managed Care – PPO

## 2020-08-22 ENCOUNTER — Encounter (HOSPITAL_COMMUNITY): Payer: Self-pay

## 2020-08-22 DIAGNOSIS — R1032 Left lower quadrant pain: Secondary | ICD-10-CM | POA: Diagnosis not present

## 2020-08-22 DIAGNOSIS — E109 Type 1 diabetes mellitus without complications: Secondary | ICD-10-CM | POA: Diagnosis not present

## 2020-08-22 DIAGNOSIS — R112 Nausea with vomiting, unspecified: Secondary | ICD-10-CM | POA: Diagnosis not present

## 2020-08-22 DIAGNOSIS — N12 Tubulo-interstitial nephritis, not specified as acute or chronic: Secondary | ICD-10-CM | POA: Diagnosis not present

## 2020-08-22 DIAGNOSIS — R109 Unspecified abdominal pain: Secondary | ICD-10-CM

## 2020-08-22 DIAGNOSIS — Z975 Presence of (intrauterine) contraceptive device: Secondary | ICD-10-CM | POA: Insufficient documentation

## 2020-08-22 DIAGNOSIS — Z794 Long term (current) use of insulin: Secondary | ICD-10-CM | POA: Diagnosis not present

## 2020-08-22 LAB — COMPREHENSIVE METABOLIC PANEL
ALT: 14 U/L (ref 0–44)
AST: 15 U/L (ref 15–41)
Albumin: 4.3 g/dL (ref 3.5–5.0)
Alkaline Phosphatase: 61 U/L (ref 38–126)
Anion gap: 8 (ref 5–15)
BUN: 13 mg/dL (ref 6–20)
CO2: 26 mmol/L (ref 22–32)
Calcium: 9.3 mg/dL (ref 8.9–10.3)
Chloride: 106 mmol/L (ref 98–111)
Creatinine, Ser: 0.66 mg/dL (ref 0.44–1.00)
GFR, Estimated: >60 mL/min (ref >60)
Glucose, Bld: 117 mg/dL — ABNORMAL HIGH (ref 70–99)
Potassium: 4 mmol/L (ref 3.5–5.1)
Sodium: 140 mmol/L (ref 135–145)
Total Bilirubin: 0.3 mg/dL (ref 0.3–1.2)
Total Protein: 7.4 g/dL (ref 6.5–8.1)

## 2020-08-22 LAB — URINALYSIS, ROUTINE W REFLEX MICROSCOPIC
Bacteria, UA: NONE SEEN
Bilirubin Urine: NEGATIVE
Glucose, UA: >=500 mg/dL — AB
Hgb urine dipstick: NEGATIVE
Ketones, ur: 5 mg/dL — AB
Leukocytes,Ua: NEGATIVE
Nitrite: NEGATIVE
Protein, ur: NEGATIVE mg/dL
Specific Gravity, Urine: 1.04 — ABNORMAL HIGH (ref 1.005–1.030)
pH: 5 (ref 5.0–8.0)

## 2020-08-22 LAB — CBC
HCT: 44.6 % (ref 36.0–46.0)
Hemoglobin: 14.7 g/dL (ref 12.0–15.0)
MCH: 29.9 pg (ref 26.0–34.0)
MCHC: 33 g/dL (ref 30.0–36.0)
MCV: 90.7 fL (ref 80.0–100.0)
Platelets: 437 10*3/uL — ABNORMAL HIGH (ref 150–400)
RBC: 4.92 MIL/uL (ref 3.87–5.11)
RDW: 12.6 % (ref 11.5–15.5)
WBC: 8.2 10*3/uL (ref 4.0–10.5)
nRBC: 0 % (ref 0.0–0.2)

## 2020-08-22 LAB — CBG MONITORING, ED: Glucose-Capillary: 153 mg/dL — ABNORMAL HIGH (ref 70–99)

## 2020-08-22 MED ORDER — DIPHENHYDRAMINE HCL 50 MG/ML IJ SOLN
25.0000 mg | Freq: Once | INTRAMUSCULAR | Status: AC
  Administered 2020-08-22: 25 mg via INTRAVENOUS
  Filled 2020-08-22: qty 1

## 2020-08-22 MED ORDER — ONDANSETRON HCL 4 MG/2ML IJ SOLN
4.0000 mg | Freq: Once | INTRAMUSCULAR | Status: AC
  Administered 2020-08-22: 4 mg via INTRAVENOUS
  Filled 2020-08-22: qty 2

## 2020-08-22 MED ORDER — HYDROMORPHONE HCL 1 MG/ML IJ SOLN
0.5000 mg | Freq: Once | INTRAMUSCULAR | Status: AC
Start: 2020-08-22 — End: 2020-08-22
  Administered 2020-08-22: 0.5 mg via INTRAVENOUS
  Filled 2020-08-22: qty 1

## 2020-08-22 MED ORDER — SODIUM CHLORIDE 0.9 % IV BOLUS
1000.0000 mL | Freq: Once | INTRAVENOUS | Status: AC
  Administered 2020-08-22: 1000 mL via INTRAVENOUS

## 2020-08-22 MED ORDER — IOHEXOL 300 MG/ML  SOLN
100.0000 mL | Freq: Once | INTRAMUSCULAR | Status: AC | PRN
  Administered 2020-08-22: 100 mL via INTRAVENOUS

## 2020-08-22 MED ORDER — HYDROMORPHONE HCL 1 MG/ML IJ SOLN
1.0000 mg | Freq: Once | INTRAMUSCULAR | Status: AC
Start: 2020-08-22 — End: 2020-08-22
  Administered 2020-08-22: 1 mg via INTRAVENOUS
  Filled 2020-08-22: qty 1

## 2020-08-22 MED ORDER — KETOROLAC TROMETHAMINE 30 MG/ML IJ SOLN
30.0000 mg | Freq: Once | INTRAMUSCULAR | Status: AC
  Administered 2020-08-22: 30 mg via INTRAVENOUS
  Filled 2020-08-22: qty 1

## 2020-08-22 MED ORDER — MORPHINE SULFATE (PF) 2 MG/ML IV SOLN
2.0000 mg | Freq: Once | INTRAVENOUS | Status: AC
  Administered 2020-08-22: 2 mg via INTRAVENOUS
  Filled 2020-08-22: qty 1

## 2020-08-22 NOTE — Telephone Encounter (Signed)
PA for Emgality started on CMM Key: [id number]  Your PA has been resolved, no additional PA is required.

## 2020-08-22 NOTE — Discharge Instructions (Addendum)
You came to the emergency department to be evaluated for your flank pain, nausea and vomiting.  Your urinalysis showed no sign of infection.  The ultrasound of your kidneys was unremarkable, except for showing a 33mm stone in your left kidney, however this is less likely to be causing your symptoms.  The CT scan of your abdomen showed no signs of any acute problem.    Please continue to take your prescribed antibiotic.  Please continue to use your prescribed Zofran.  You may continue to use the remainder of your prescribed Percocet.  Not given any prescriptions for pain medications today as pain continues it could be a sign of a new or concerning issue.  Please follow-up with your primary care provider for completing antibiotics.  Get help right away if: You have trouble breathing or you are short of breath. Your abdomen hurts or it is swollen or red. You have persistent nausea or vomiting. You feel faint or you pass out. You have blood in your urine.

## 2020-08-22 NOTE — ED Triage Notes (Signed)
Pt arrived via walk in, states seen recently for kidney infection, pain worsening. Abx and pain meds at home not helping.

## 2020-08-22 NOTE — ED Notes (Signed)
Ambulatory to restroom unassisted.  

## 2020-08-22 NOTE — ED Provider Notes (Signed)
[facility] DEPT Provider Note   CSN: [id number] Arrival date & time: 08/22/20  1040     History Chief Complaint  Patient presents with  . Flank Pain    Infiniti Hoefling Rasco is a 45 y.o. female history of type 1 diabetes, lipidemia, migraines, depression, appendectomy, cholecystectomy.  Presents with chief complaint of worsening bilateral flank pain.  Pain first began on 2/17.  Pain has worsened since then.  Patient rates her pain as a 8/10 on the pain scale.  Pain is worse with movement.  No alleviating factors.  Patient reports associated nausea and vomiting.  Patient reports that she vomited 3 times last 24 hours.  Patient denies any bloody emesis or coffee-ground emesis.  Patient also complains of new left lower quadrant abdominal pain.  Patient reports that the pain began this morning.  Patient rates pain as a 5 out of 10 on the pain scale.  Patient denies any alleviating or aggravating factors.  Patient reports that she has been having a fever with T-max of 101F orally yesterday evening.  Reports that her pain first began on 2/17.  Patient reports that she came to hospital on the day and was diagnosed with pyelonephritis.  Patient was started on Keflex.  Patient reports he took 1.5 days of Keflex.  Patient returned to [facility] on 2/19 with complaints of worsening pain and nausea.  Patient was changed to [place].  Reports that she has completed 3 full days of this medication.  She is supposed to be on day 4 of this medication today however has not been able to take it due to her nausea and vomiting.  Patient reports that she last took Zofran and Percocet  this morning with no relief of symptoms.  Patient reports that she has an IUD in place.  Patient denies any LMP for multiple years.  She denies any previous pregnancies.      HPI     Past Medical History:  Diagnosis Date  . Adopted   . Cluster headache syndrome, intractable   . Depression   .  Migraine without aura, with intractable migraine, so stated, without mention of status migrainosus 12/05/2012  . Ovarian cyst   . Sepsis ([place])   . STD (sexually transmitted disease)    HSV II   . Type I diabetes mellitus [facility])     Patient Active Problem List   Diagnosis Date Noted  . Dehydration 09/17/2018  . Gastroesophageal reflux disease   . ADHD   . Volume depletion 07/21/2018  . Diabetic ketoacidosis ([place]) 07/21/2018  . Morbid obesity ([place]) 06/16/2018  . Chronic tension-type headache, intractable 06/03/2018  . Multiple thyroid nodules 04/17/2018  . 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 ([place]) 01/01/2018  . DKA, type 1 ([place]) 02/14/2015  . DKA (diabetic ketoacidoses) 02/14/2015  . Thyroid nodule 02/14/2015  . Depression with anxiety 02/14/2015  . Acute kidney injury ([place]) 02/14/2015  . Leukocytosis 02/14/2015  . Increased anion gap metabolic acidosis 93/26/7124  . History of frequent urinary tract infections 01/25/2014  . Anxiety 12/04/2013  . Neuropathic pain 12/04/2013  . Recurrent UTI 12/04/2013  . Aphakia of left eye 06/23/2013  . Retinal detachment 06/23/2013  . PDR (proliferative diabetic retinopathy) ([place]) 06/23/2013  . Recurrent major depression-severe ([place]) 01/06/2013  . Generalized anxiety disorder 01/06/2013  . Common migraine with intractable migraine 12/05/2012  . IDDM (insulin dependent diabetes mellitus) ([place]) 12/05/2012  . RLQ abdominal pain 11/30/2011    Past Surgical History:  Procedure Laterality Date  . APPENDECTOMY    . BUNIONECTOMY Bilateral   . CHOLECYSTECTOMY    . EYE SURGERY    . OVARIAN CYST REMOVAL       OB History    Gravida  0   Para  0   Term  0   Preterm  0   AB  0   Living  0     SAB  0   IAB  0   Ectopic  0   Multiple  0   Live Births  0           Family History  Adopted: Yes  Problem Relation Age of Onset  . Diabetes Father     Social History   Tobacco Use  . Smoking status: Never Smoker   . Smokeless tobacco: Never Used  Vaping Use  . Vaping Use: Never used  Substance Use Topics  . Alcohol use: Yes    Comment: seldom  . Drug use: No    Home Medications Prior to Admission medications   Medication Sig Start Date End Date Taking? Authorizing Provider  albuterol (VENTOLIN HFA) 108 (90 Base) MCG/ACT inhaler Inhale 2 puffs into the lungs every 6 (six) hours as needed for shortness of breath or wheezing. 07/30/20 07/30/21  [provider]  amphetamine-dextroamphetamine (ADDERALL) 20 MG tablet Take 20 mg by mouth 4 (four) times daily. 10/24/17   [provider]  Armodafinil 250 MG tablet Take 250 mg by mouth daily. 08/03/20   [provider]  benzonatate (TESSALON) 200 MG capsule Take 200 mg by mouth 3 (three) times daily as needed for cough. 07/30/20   [provider]  blood glucose meter kit and supplies KIT Dispense based on patient and insurance preference. Use up to four times daily as directed. (FOR ICD-9 250.00, 250.01). 07/23/18   Kayleen Memos, DO  calcium carbonate (TUMS - DOSED IN MG ELEMENTAL CALCIUM) 500 MG chewable tablet Chew 1 tablet by mouth daily as needed for indigestion or heartburn.    [provider]  cephALEXin (KEFLEX) 500 MG capsule Take 1 capsule (500 mg total) by mouth 3 (three) times daily for 7 days. 08/18/20 08/25/20  Khatri, Hina, PA-C  clonazePAM (KLONOPIN) 1 MG tablet Take 1 mg by mouth 4 (four) times daily. For anxiety 01/09/13   Niel Hummer, NP  dexamethasone (DECADRON) 2 MG tablet Take 3 tablets the first day, 2 the second and 1 the third day Patient not taking: No sig reported 07/26/20   Suzzanne Cloud, NP  DULoxetine (CYMBALTA) 60 MG capsule Take 60 mg by mouth daily.    [provider]  gabapentin (NEURONTIN) 300 MG capsule Take 300 mg by mouth 4 (four) times daily. 08/27/18   [provider]  Galcanezumab-gnlm (EMGALITY) 120 MG/ML SOAJ Inject 120 mg into the skin every 30 (thirty) days.  07/26/20   Suzzanne Cloud, NP  ibuprofen (ADVIL,MOTRIN) 800 MG tablet Take 1 tablet (800 mg total) by mouth every 8 (eight) hours as needed. Patient taking differently: Take 800 mg by mouth every 8 (eight) hours as needed for moderate pain. 08/25/18   Salvadore Dom, MD  Insulin Aspart, w/Niacinamide, (FIASP FLEXTOUCH) 100 UNIT/ML SOPN Inject 1-8 Units into the skin 3 (three) times daily. Patient taking differently: Inject 1-12 Units into the skin 3 (three) times daily. Per sliding scale, 1 unit for 8 grams of carbs 09/19/18   Eugenie Filler, MD  insulin degludec (TRESIBA) 100 UNIT/ML  SOPN FlexTouch Pen Inject 18 Units into the skin at bedtime. 09/19/18   [provider]  Insulin Pen Needle 31G X 5 MM MISC 1-8 Units by Does not apply route 3 (three) times daily. 09/19/18   Eugenie Filler, MD  levofloxacin (LEVAQUIN) 750 MG tablet Take 1 tablet (750 mg total) by mouth daily for 5 days. 08/20/20 08/25/20  Tegeler, Gwenyth Allegra, MD  levonorgestrel (MIRENA) 20 MCG/24HR IUD 1 each by Intrauterine route once. Placed 06/2014    [provider]  meloxicam (MOBIC) 15 MG tablet Take 15 mg by mouth daily as needed for pain. 08/09/20   [provider]  methocarbamol (ROBAXIN) 500 MG tablet Take 1 tablet (500 mg total) by mouth 2 (two) times daily. 08/18/20   Delia Heady, PA-C  Multiple Vitamins-Minerals (MULTIVITAMIN [facility]OM[facility]) CHEW Chew 1 each by mouth daily.    [provider]  ondansetron (ZOFRAN) 4 MG tablet Take 1 tablet (4 mg total) by mouth every 8 (eight) hours as needed for nausea or vomiting. 07/26/20   Suzzanne Cloud, NP  ondansetron (ZOFRAN) 4 MG tablet Take 1 tablet (4 mg total) by mouth every 8 (eight) hours as needed for nausea or vomiting. 08/20/20   Tegeler, Gwenyth Allegra, MD  oxyCODONE-acetaminophen (PERCOCET/ROXICET) 5-325 MG tablet Take 1 tablet by mouth every 4 (four) hours as needed for severe pain. 08/20/20   Tegeler, Gwenyth Allegra, MD   Rimegepant Sulfate (NURTEC) 75 MG TBDP Take 75 mg by mouth as needed (take 1 at onset of headache, max is 1 tablet in 24 hours). 07/26/20   Suzzanne Cloud, NP  solifenacin (VESICARE) 10 MG tablet Take 10 mg by mouth daily. 08/09/20   [provider]  Vitamin D, Ergocalciferol, (DRISDOL) 1.25 MG (50000 UNIT) CAPS capsule Take 50,000 Units by mouth every 7 (seven) days. 08/10/20   [provider]  zolpidem (AMBIEN CR) 12.5 MG CR tablet Take 12.5 mg by mouth at bedtime.    [provider]  dicyclomine (BENTYL) 20 MG tablet Take 1 tablet (20 mg total) by mouth 2 (two) times daily. Patient not taking: Reported on 12/20/2018 04/01/18 12/20/18  Drenda Freeze, MD  insulin aspart (NOVOLOG) 100 UNIT/ML injection Inject 0-9 Units into the skin 3 (three) times daily with meals. Patient not taking: Reported on 12/20/2018 09/19/18 12/20/18  Eugenie Filler, MD  Insulin Glargine [facility]) 100 UNIT/ML SOPN Inject 0.22 mLs (22 Units total) into the skin daily. Patient not taking: Reported on 12/20/2018 09/19/18 12/20/18  Eugenie Filler, MD  misoprostol (CYTOTEC) 200 MCG tablet Place 2 tablets vaginally 6-12 hours prior to IUD insertion Patient not taking: Reported on 12/20/2018 08/25/18 12/20/18  Salvadore Dom, MD    Allergies    Sulfa antibiotics, Wellbutrin [bupropion], Amoxicillin, and Codeine  Review of Systems   Review of Systems  Constitutional: Positive for chills and fever.  Eyes: Negative for visual disturbance.  Respiratory: Positive for cough (basline for per patient ). Negative for shortness of breath.   Cardiovascular: Negative for chest pain.  Gastrointestinal: Positive for abdominal pain, nausea and vomiting. Negative for abdominal distention, anal bleeding, blood in stool, constipation, diarrhea and rectal pain.  Genitourinary: Positive for flank pain. Negative for decreased urine volume, difficulty urinating, dysuria, frequency, genital sores,  hematuria, menstrual problem, pelvic pain, vaginal bleeding, vaginal discharge and vaginal pain.  Musculoskeletal: Positive for arthralgias. Negative for back pain and neck pain.  Skin: Negative for color change and rash.  Neurological: Negative  for dizziness, syncope, light-headedness and headaches.  Psychiatric/Behavioral: Negative for confusion.    Physical Exam Updated Vital Signs BP (!) 158/78 (BP Location: Right Arm)   Pulse 94   Temp 99.4 F (37.4 C) (Oral)   Resp 13   SpO2 99%   Physical Exam Vitals and nursing note reviewed.  Constitutional:      General: She is not in acute distress.    Appearance: She is not ill-appearing, toxic-appearing or diaphoretic.  HENT:     Head: Normocephalic.  Eyes:     General: No scleral icterus.       Right eye: No discharge.        Left eye: No discharge.  Cardiovascular:     Rate and Rhythm: Normal rate.     Heart sounds: Normal heart sounds.  Pulmonary:     Effort: Pulmonary effort is normal. No tachypnea, bradypnea or respiratory distress.     Breath sounds: Normal breath sounds. No stridor. No decreased breath sounds, wheezing, rhonchi or rales.  Abdominal:     General: Bowel sounds are normal. There is no distension. There are no signs of injury.     Palpations: Abdomen is soft. There is no mass or pulsatile mass.     Tenderness: There is abdominal tenderness in the left lower quadrant. There is right CVA tenderness and left CVA tenderness. There is no guarding or rebound.     Hernia: There is no hernia in the umbilical area or ventral area.  Musculoskeletal:     Cervical back: Neck supple.  Skin:    General: Skin is warm and dry.  Neurological:     General: No focal deficit present.     Mental Status: She is alert.  Psychiatric:        Behavior: Behavior is cooperative.     ED Results / Procedures / Treatments   Labs (all labs ordered are listed, but only abnormal results are displayed) Labs Reviewed  COMPREHENSIVE  METABOLIC PANEL - Abnormal; Notable for the following components:      Result Value   Glucose, Bld 117 (*)    All other components within normal limits  CBC - Abnormal; Notable for the following components:   Platelets 437 (*)    All other components within normal limits  URINALYSIS, ROUTINE W REFLEX MICROSCOPIC - Abnormal; Notable for the following components:   Color, Urine STRAW (*)    Specific Gravity, Urine 1.040 (*)    Glucose, UA >=500 (*)    Ketones, ur 5 (*)    All other components within normal limits  CBG MONITORING, ED - Abnormal; Notable for the following components:   Glucose-Capillary 153 (*)    All other components within normal limits    EKG None  Radiology CT ABDOMEN PELVIS W CONTRAST  Result Date: 08/22/2020 CLINICAL DATA:  Worsening abdominal pain. History of recent kidney infection. EXAM: CT ABDOMEN AND PELVIS WITH CONTRAST TECHNIQUE: Multidetector CT imaging of the abdomen and pelvis was performed using the standard protocol following bolus administration of intravenous contrast. CONTRAST:  130m OMNIPAQUE IOHEXOL 300 MG/ML  SOLN COMPARISON:  August 18, 2020 FINDINGS: Lower chest: No acute abnormality. Hepatobiliary: No focal liver abnormality is seen. Status post cholecystectomy. No biliary dilatation. Pancreas: Unremarkable. No pancreatic ductal dilatation or surrounding inflammatory changes. Spleen: Normal in size without focal abnormality. Adrenals/Urinary Tract: Adrenal glands are unremarkable. Kidneys are normal, without renal calculi, focal lesion, or hydronephrosis. Bladder is unremarkable. Stomach/Bowel: Stomach is within normal limits. The appendix  is surgically absent. A large amount of stool is seen throughout the colon. No evidence of bowel wall thickening, distention, or inflammatory changes. Vascular/Lymphatic: Mild aortic atherosclerosis. No enlarged abdominal or pelvic lymph nodes. Reproductive: An IUD is seen within an otherwise normal appearing  uterus. The bilateral adnexa are unremarkable. Other: No abdominal wall hernia or abnormality. No abdominopelvic ascites. Musculoskeletal: No acute or significant osseous findings. IMPRESSION: 1. Evidence of prior cholecystectomy. 2. IUD in place. 3. Aortic atherosclerosis. Aortic Atherosclerosis (ICD10-I70.0). Electronically Signed   By: Virgina Norfolk M.D.   On: 08/22/2020 19:23   US Renal  Result Date: 08/22/2020 CLINICAL DATA:  Pyelonephritis EXAM: RENAL / URINARY TRACT ULTRASOUND COMPLETE COMPARISON:  CT abdomen pelvis 08/18/2020. Renal ultrasound 08/20/2020 FINDINGS: Right Kidney: Renal measurements: 10.6 x 5.3 x 4.9 cm = volume: 144 mL. Echogenicity within normal limits. No mass or hydronephrosis visualized. Left Kidney: Renal measurements: 11.2 x 5.0 x 5.1 cm = volume: 151 mL. 5 mm left lower pole stone. No renal hydronephrosis. Normal cortical echogenicity. Bladder: Appears normal for degree of bladder distention. Bilateral ureteral jets identified Other: None. IMPRESSION: 5 mm left renal calculus.  No renal obstruction or mass. Electronically Signed   By: Franchot Gallo M.D.   On: 08/22/2020 17:06    Procedures Procedures   Medications Ordered in ED Medications  ondansetron (ZOFRAN) injection 4 mg (4 mg Intravenous Given 08/22/20 1700)  morphine 2 MG/ML injection 2 mg (2 mg Intravenous Given 08/22/20 1701)  diphenhydrAMINE (BENADRYL) injection 25 mg (25 mg Intravenous Given 08/22/20 1809)  HYDROmorphone (DILAUDID) injection 1 mg (1 mg Intravenous Given 08/22/20 1812)  iohexol (OMNIPAQUE) 300 MG/ML solution 100 mL (100 mLs Intravenous Contrast Given 08/22/20 1840)  sodium chloride 0.9 % bolus 1,000 mL (0 mLs Intravenous Stopped 08/22/20 2200)  ondansetron (ZOFRAN) injection 4 mg (4 mg Intravenous Given 08/22/20 1940)  HYDROmorphone (DILAUDID) injection 0.5 mg (0.5 mg Intravenous Given 08/22/20 1940)  ketorolac (TORADOL) 30 MG/ML injection 30 mg (30 mg Intravenous Given 08/22/20 2025)   diphenhydrAMINE (BENADRYL) injection 25 mg (25 mg Intravenous Given 08/22/20 2025)    ED Course  I have reviewed the triage vital signs and the nursing notes.  Pertinent labs & imaging results that were available during my care of the patient were reviewed by me and considered in my medical decision making (see chart for details).    MDM Rules/Calculators/A&P                           Alert 45 year old female no acute distress, nontoxic appearing.  Patient presents with chief complaint of worsening bilateral flank pain, nausea and vomiting.  Patient reports that symptoms first began on 2/17.  Today patient is complaining of new left lower quadrant abdominal pain.  Per chart review patient was seen at [facility] on that date, noncontrast CT scan showed punctate nonobstructing stone in lower pole of the left kidney.,  No hydronephrosis or ureter  al stones.  UA showed signs of infection, patient was diagnosed with pyelonephritis and started on Keflex. Patient returned to was in the hospital on 2/19 with continued complaints of flank pain.  Renal ultrasound showed kidneys normal in appearance, no obstruction.  Patient antibiotic was changed to Levaquin, patient given pain medication and nausea medication for outpatient setting.  Normoactive bowel sounds.  Abdomen soft, nondistended, tenderness to lower left quadrant, no guarding, no rebound tenderness, no mass, pulsatile mass, ventral hernia or umbilical hernia.  Tenderness to  bilateral CVA.  Patient afebrile with temperature at 99.88F, blood pressure elevated at 158/78, no tachycardia noted.    CMP, CBC, UA, POC CBG, ordered and pending.  Patient given Zofran and IV morphine for pain management.  After receiving IV morphine patient reports feeling itchy.  Patient reports she has had similar experiences to receiving morphine in the past.  Per chart review patient had similar complaints after receiving IV pain medication during previous emergency  department stays.  Patient given 25 mg IV Benadryl.  CBC shows slight increase in platelets, all other values within normal limits. CMP shows glucose slightly elevated at 117, all other values within normal limits.  With anion gap within normal limits, glucose mildly elevated, bicarb within normal limits less concerning for DKA causing patient's nausea and vomiting.  Patient requested avoiding CT scan if at all possible.  Renal ultrasound was ordered and showed 5 mm left renal calculus no renal obstruction or mass, no renal hydronephrosis.  Due to patient's new left lower quadrant pain concern for possible diverticulitis.  Discussion was had with patient about obtaining CT scan.  Patient is amenable to this intervention. CT scan showed no acute abnormality.  No evidence of bowel wall thickening, distention, or inflammatory changes.  Urinalysis showed bacteria none, WBC 0-5, leukocytes negative, nitrite negative.  No signs of urinary tract infection.  Patient was noted to have glucose greater than 500 and ketones 5.  Patient does have history of type 1 diabetes.    During hospitalization patient complained of continued nausea, and flank pain.  Patient received additional Zofran, Dilaudid pain medication, IV fluids and toradol.   Patient reported minimal improvement in her symptoms.  On serial reexamination patient's abdomen remained soft, nondistended, improvement in tenderness to lower left quadrant.  No signs of renal calculi, hydronephrosis related to worsening of pyelonephritis, urine shows no signs of infection.  Discussed findings from imaging and labs with patient.  Patient is amenable to discharge at this time.  Patient was advised to continue Levaquin prescription, prescribed Zofran, and prescribed Percocet.  Patient was advised to follow-up with her primary care provider.  Discussed results, findings, treatment and follow up. Patient advised of return precautions. Patient verbalized  understanding and agreed with plan.  Patient was discussed with and evaluated by Dr. Billy Fischer.   Final Clinical Impression(s) / ED Diagnoses Final diagnoses:  Flank pain  Non-intractable vomiting with nausea, unspecified vomiting type    Rx / DC Orders ED Discharge Orders    None       Loni Beckwith, PA-C 08/23/20 0017    Gareth Morgan, MD 08/24/20 1131

## 2020-08-22 NOTE — ED Provider Notes (Incomplete)
[facility] DEPT Provider Note   CSN: [id number] Arrival date & time: 08/22/20  1040     History Chief Complaint  Patient presents with  . Flank Pain    [name] is a 45 y.o. female history of type 1 diabetes, lipidemia, migraines, depression, appendectomy, cholecystectomy.  Presents with chief complaint of worsening bilateral flank pain.  Pain first began on 2/17.  Pain has worsened since then.  Patient rates her pain as a 8/10 on the pain scale.  Pain is worse with movement.  No alleviating factors.  Patient reports associated nausea and vomiting.  Patient reports that she vomited 3 times last 24 hours.  Patient denies any bloody emesis or coffee-ground emesis.  Patient also complains of new left lower quadrant abdominal pain.  Patient reports that the pain began this morning.  Patient rates pain as a 5 out of 10 on the pain scale.  Patient denies any alleviating or aggravating factors.  Patient reports that she has been having a fever with T-max of 101F orally yesterday evening.  Reports that her pain first began on 2/17.  Patient reports that she came to hospital on the day and was diagnosed with pyelonephritis.  Patient was started on Keflex.  Patient reports he took 1.5 days of Keflex.  Patient returned to [facility] on 2/19 with complaints of worsening pain and nausea.  Patient was changed to [place].  Reports that she has completed 3 full days of this medication.  She is supposed to be on day 4 of this medication today however has not been able to take it due to her nausea and vomiting.  Patient reports that she last took Zofran and Percocet  this morning with no relief of symptoms.  Patient reports that she has an IUD in place.  Patient denies any LMP for multiple years.  She denies any previous pregnancies.      HPI     Past Medical History:  Diagnosis Date  . Adopted   . Cluster headache syndrome, intractable   . Depression   .  Migraine without aura, with intractable migraine, so stated, without mention of status migrainosus 12/05/2012  . Ovarian cyst   . Sepsis ([place])   . STD (sexually transmitted disease)    HSV II   . Type I diabetes mellitus [facility])     Patient Active Problem List   Diagnosis Date Noted  . Dehydration 09/17/2018  . Gastroesophageal reflux disease   . ADHD   . Volume depletion 07/21/2018  . Diabetic ketoacidosis ([place]) 07/21/2018  . Morbid obesity ([place]) 06/16/2018  . Chronic tension-type headache, intractable 06/03/2018  . Multiple thyroid nodules 04/17/2018  . 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 ([place]) 01/01/2018  . DKA, type 1 ([place]) 02/14/2015  . DKA (diabetic ketoacidoses) 02/14/2015  . Thyroid nodule 02/14/2015  . Depression with anxiety 02/14/2015  . Acute kidney injury ([place]) 02/14/2015  . Leukocytosis 02/14/2015  . Increased anion gap metabolic acidosis 93/26/7124  . History of frequent urinary tract infections 01/25/2014  . Anxiety 12/04/2013  . Neuropathic pain 12/04/2013  . Recurrent UTI 12/04/2013  . Aphakia of left eye 06/23/2013  . Retinal detachment 06/23/2013  . PDR (proliferative diabetic retinopathy) ([place]) 06/23/2013  . Recurrent major depression-severe ([place]) 01/06/2013  . Generalized anxiety disorder 01/06/2013  . Common migraine with intractable migraine 12/05/2012  . IDDM (insulin dependent diabetes mellitus) ([place]) 12/05/2012  . RLQ abdominal pain 11/30/2011    Past Surgical History:  Procedure Laterality Date  . APPENDECTOMY    . BUNIONECTOMY Bilateral   . CHOLECYSTECTOMY    . EYE SURGERY    . OVARIAN CYST REMOVAL       OB History    Gravida  0   Para  0   Term  0   Preterm  0   AB  0   Living  0     SAB  0   IAB  0   Ectopic  0   Multiple  0   Live Births  0           Family History  Adopted: Yes  Problem Relation Age of Onset  . Diabetes Father     Social History   Tobacco Use  . Smoking status: Never Smoker   . Smokeless tobacco: Never Used  Vaping Use  . Vaping Use: Never used  Substance Use Topics  . Alcohol use: Yes    Comment: seldom  . Drug use: No    Home Medications Prior to Admission medications   Medication Sig Start Date End Date Taking? Authorizing Provider  albuterol (VENTOLIN HFA) 108 (90 Base) MCG/ACT inhaler Inhale 2 puffs into the lungs every 6 (six) hours as needed for shortness of breath or wheezing. 07/30/20 07/30/21  [provider]  amphetamine-dextroamphetamine (ADDERALL) 20 MG tablet Take 20 mg by mouth 4 (four) times daily. 10/24/17   [provider]  Armodafinil 250 MG tablet Take 250 mg by mouth daily. 08/03/20   [provider]  benzonatate (TESSALON) 200 MG capsule Take 200 mg by mouth 3 (three) times daily as needed for cough. 07/30/20   [provider]  blood glucose meter kit and supplies KIT Dispense based on patient and insurance preference. Use up to four times daily as directed. (FOR ICD-9 250.00, 250.01). 07/23/18   Kayleen Memos, DO  calcium carbonate (TUMS - DOSED IN MG ELEMENTAL CALCIUM) 500 MG chewable tablet Chew 1 tablet by mouth daily as needed for indigestion or heartburn.    [provider]  cephALEXin (KEFLEX) 500 MG capsule Take 1 capsule (500 mg total) by mouth 3 (three) times daily for 7 days. 08/18/20 08/25/20  Khatri, Hina, PA-C  clonazePAM (KLONOPIN) 1 MG tablet Take 1 mg by mouth 4 (four) times daily. For anxiety 01/09/13   Niel Hummer, NP  dexamethasone (DECADRON) 2 MG tablet Take 3 tablets the first day, 2 the second and 1 the third day Patient not taking: No sig reported 07/26/20   Suzzanne Cloud, NP  DULoxetine (CYMBALTA) 60 MG capsule Take 60 mg by mouth daily.    [provider]  gabapentin (NEURONTIN) 300 MG capsule Take 300 mg by mouth 4 (four) times daily. 08/27/18   [provider]  Galcanezumab-gnlm (EMGALITY) 120 MG/ML SOAJ Inject 120 mg into the skin every 30 (thirty) days.  07/26/20   Suzzanne Cloud, NP  ibuprofen (ADVIL,MOTRIN) 800 MG tablet Take 1 tablet (800 mg total) by mouth every 8 (eight) hours as needed. Patient taking differently: Take 800 mg by mouth every 8 (eight) hours as needed for moderate pain. 08/25/18   Salvadore Dom, MD  Insulin Aspart, w/Niacinamide, (FIASP FLEXTOUCH) 100 UNIT/ML SOPN Inject 1-8 Units into the skin 3 (three) times daily. Patient taking differently: Inject 1-12 Units into the skin 3 (three) times daily. Per sliding scale, 1 unit for 8 grams of carbs 09/19/18   Eugenie Filler, MD  insulin degludec (TRESIBA) 100 UNIT/ML  SOPN FlexTouch Pen Inject 18 Units into the skin at bedtime. 09/19/18   [provider]  Insulin Pen Needle 31G X 5 MM MISC 1-8 Units by Does not apply route 3 (three) times daily. 09/19/18   Eugenie Filler, MD  levofloxacin (LEVAQUIN) 750 MG tablet Take 1 tablet (750 mg total) by mouth daily for 5 days. 08/20/20 08/25/20  Tegeler, Gwenyth Allegra, MD  levonorgestrel (MIRENA) 20 MCG/24HR IUD 1 each by Intrauterine route once. Placed 06/2014    [provider]  meloxicam (MOBIC) 15 MG tablet Take 15 mg by mouth daily as needed for pain. 08/09/20   [provider]  methocarbamol (ROBAXIN) 500 MG tablet Take 1 tablet (500 mg total) by mouth 2 (two) times daily. 08/18/20   Delia Heady, PA-C  Multiple Vitamins-Minerals (MULTIVITAMIN [facility]OM[facility]) CHEW Chew 1 each by mouth daily.    [provider]  ondansetron (ZOFRAN) 4 MG tablet Take 1 tablet (4 mg total) by mouth every 8 (eight) hours as needed for nausea or vomiting. 07/26/20   Suzzanne Cloud, NP  ondansetron (ZOFRAN) 4 MG tablet Take 1 tablet (4 mg total) by mouth every 8 (eight) hours as needed for nausea or vomiting. 08/20/20   Tegeler, Gwenyth Allegra, MD  oxyCODONE-acetaminophen (PERCOCET/ROXICET) 5-325 MG tablet Take 1 tablet by mouth every 4 (four) hours as needed for severe pain. 08/20/20   Tegeler, Gwenyth Allegra, MD   Rimegepant Sulfate (NURTEC) 75 MG TBDP Take 75 mg by mouth as needed (take 1 at onset of headache, max is 1 tablet in 24 hours). 07/26/20   Suzzanne Cloud, NP  solifenacin (VESICARE) 10 MG tablet Take 10 mg by mouth daily. 08/09/20   [provider]  Vitamin D, Ergocalciferol, (DRISDOL) 1.25 MG (50000 UNIT) CAPS capsule Take 50,000 Units by mouth every 7 (seven) days. 08/10/20   [provider]  zolpidem (AMBIEN CR) 12.5 MG CR tablet Take 12.5 mg by mouth at bedtime.    [provider]  dicyclomine (BENTYL) 20 MG tablet Take 1 tablet (20 mg total) by mouth 2 (two) times daily. Patient not taking: Reported on 12/20/2018 04/01/18 12/20/18  Drenda Freeze, MD  insulin aspart (NOVOLOG) 100 UNIT/ML injection Inject 0-9 Units into the skin 3 (three) times daily with meals. Patient not taking: Reported on 12/20/2018 09/19/18 12/20/18  Eugenie Filler, MD  Insulin Glargine [facility]) 100 UNIT/ML SOPN Inject 0.22 mLs (22 Units total) into the skin daily. Patient not taking: Reported on 12/20/2018 09/19/18 12/20/18  Eugenie Filler, MD  misoprostol (CYTOTEC) 200 MCG tablet Place 2 tablets vaginally 6-12 hours prior to IUD insertion Patient not taking: Reported on 12/20/2018 08/25/18 12/20/18  Salvadore Dom, MD    Allergies    Sulfa antibiotics, Wellbutrin [bupropion], Amoxicillin, and Codeine  Review of Systems   Review of Systems  Constitutional: Positive for chills and fever.  Eyes: Negative for visual disturbance.  Respiratory: Positive for cough (basline for per patient ). Negative for shortness of breath.   Cardiovascular: Negative for chest pain.  Gastrointestinal: Positive for abdominal pain, nausea and vomiting. Negative for abdominal distention, anal bleeding, blood in stool, constipation, diarrhea and rectal pain.  Genitourinary: Positive for flank pain. Negative for decreased urine volume, difficulty urinating, dysuria, frequency, genital sores,  hematuria, menstrual problem, pelvic pain, vaginal bleeding, vaginal discharge and vaginal pain.  Musculoskeletal: Positive for arthralgias. Negative for back pain and neck pain.  Skin: Negative for color change and rash.  Neurological: Negative  for dizziness, syncope, light-headedness and headaches.  Psychiatric/Behavioral: Negative for confusion.    Physical Exam Updated Vital Signs BP (!) 158/78 (BP Location: Right Arm)   Pulse 94   Temp 99.4 F (37.4 C) (Oral)   Resp 13   SpO2 99%   Physical Exam Vitals and nursing note reviewed.  Constitutional:      General: She is not in acute distress.    Appearance: She is not ill-appearing, toxic-appearing or diaphoretic.  HENT:     Head: Normocephalic.  Eyes:     General: No scleral icterus.       Right eye: No discharge.        Left eye: No discharge.  Cardiovascular:     Rate and Rhythm: Normal rate.     Heart sounds: Normal heart sounds.  Pulmonary:     Effort: Pulmonary effort is normal. No tachypnea, bradypnea or respiratory distress.     Breath sounds: Normal breath sounds. No stridor. No decreased breath sounds, wheezing, rhonchi or rales.  Abdominal:     General: Bowel sounds are normal. There is no distension. There are no signs of injury.     Palpations: Abdomen is soft. There is no mass or pulsatile mass.     Tenderness: There is abdominal tenderness in the left lower quadrant. There is right CVA tenderness and left CVA tenderness. There is no guarding or rebound.     Hernia: There is no hernia in the umbilical area or ventral area.  Musculoskeletal:     Cervical back: Neck supple.  Skin:    General: Skin is warm and dry.  Neurological:     General: No focal deficit present.     Mental Status: She is alert.  Psychiatric:        Behavior: Behavior is cooperative.     ED Results / Procedures / Treatments   Labs (all labs ordered are listed, but only abnormal results are displayed) Labs Reviewed  COMPREHENSIVE  METABOLIC PANEL - Abnormal; Notable for the following components:      Result Value   Glucose, Bld 117 (*)    All other components within normal limits  CBC - Abnormal; Notable for the following components:   Platelets 437 (*)    All other components within normal limits  URINALYSIS, ROUTINE W REFLEX MICROSCOPIC - Abnormal; Notable for the following components:   Color, Urine STRAW (*)    Specific Gravity, Urine 1.040 (*)    Glucose, UA >=500 (*)    Ketones, ur 5 (*)    All other components within normal limits  CBG MONITORING, ED - Abnormal; Notable for the following components:   Glucose-Capillary 153 (*)    All other components within normal limits    EKG None  Radiology CT ABDOMEN PELVIS W CONTRAST  Result Date: 08/22/2020 CLINICAL DATA:  Worsening abdominal pain. History of recent kidney infection. EXAM: CT ABDOMEN AND PELVIS WITH CONTRAST TECHNIQUE: Multidetector CT imaging of the abdomen and pelvis was performed using the standard protocol following bolus administration of intravenous contrast. CONTRAST:  130m OMNIPAQUE IOHEXOL 300 MG/ML  SOLN COMPARISON:  August 18, 2020 FINDINGS: Lower chest: No acute abnormality. Hepatobiliary: No focal liver abnormality is seen. Status post cholecystectomy. No biliary dilatation. Pancreas: Unremarkable. No pancreatic ductal dilatation or surrounding inflammatory changes. Spleen: Normal in size without focal abnormality. Adrenals/Urinary Tract: Adrenal glands are unremarkable. Kidneys are normal, without renal calculi, focal lesion, or hydronephrosis. Bladder is unremarkable. Stomach/Bowel: Stomach is within normal limits. The appendix  is surgically absent. A large amount of stool is seen throughout the colon. No evidence of bowel wall thickening, distention, or inflammatory changes. Vascular/Lymphatic: Mild aortic atherosclerosis. No enlarged abdominal or pelvic lymph nodes. Reproductive: An IUD is seen within an otherwise normal appearing  uterus. The bilateral adnexa are unremarkable. Other: No abdominal wall hernia or abnormality. No abdominopelvic ascites. Musculoskeletal: No acute or significant osseous findings. IMPRESSION: 1. Evidence of prior cholecystectomy. 2. IUD in place. 3. Aortic atherosclerosis. Aortic Atherosclerosis (ICD10-I70.0). Electronically Signed   By: Virgina Norfolk M.D.   On: 08/22/2020 19:23   US Renal  Result Date: 08/22/2020 CLINICAL DATA:  Pyelonephritis EXAM: RENAL / URINARY TRACT ULTRASOUND COMPLETE COMPARISON:  CT abdomen pelvis 08/18/2020. Renal ultrasound 08/20/2020 FINDINGS: Right Kidney: Renal measurements: 10.6 x 5.3 x 4.9 cm = volume: 144 mL. Echogenicity within normal limits. No mass or hydronephrosis visualized. Left Kidney: Renal measurements: 11.2 x 5.0 x 5.1 cm = volume: 151 mL. 5 mm left lower pole stone. No renal hydronephrosis. Normal cortical echogenicity. Bladder: Appears normal for degree of bladder distention. Bilateral ureteral jets identified Other: None. IMPRESSION: 5 mm left renal calculus.  No renal obstruction or mass. Electronically Signed   By: Franchot Gallo M.D.   On: 08/22/2020 17:06    Procedures Procedures   Medications Ordered in ED Medications  ondansetron (ZOFRAN) injection 4 mg (4 mg Intravenous Given 08/22/20 1700)  morphine 2 MG/ML injection 2 mg (2 mg Intravenous Given 08/22/20 1701)  diphenhydrAMINE (BENADRYL) injection 25 mg (25 mg Intravenous Given 08/22/20 1809)  HYDROmorphone (DILAUDID) injection 1 mg (1 mg Intravenous Given 08/22/20 1812)  iohexol (OMNIPAQUE) 300 MG/ML solution 100 mL (100 mLs Intravenous Contrast Given 08/22/20 1840)  sodium chloride 0.9 % bolus 1,000 mL (0 mLs Intravenous Stopped 08/22/20 2200)  ondansetron (ZOFRAN) injection 4 mg (4 mg Intravenous Given 08/22/20 1940)  HYDROmorphone (DILAUDID) injection 0.5 mg (0.5 mg Intravenous Given 08/22/20 1940)  ketorolac (TORADOL) 30 MG/ML injection 30 mg (30 mg Intravenous Given 08/22/20 2025)   diphenhydrAMINE (BENADRYL) injection 25 mg (25 mg Intravenous Given 08/22/20 2025)    ED Course  I have reviewed the triage vital signs and the nursing notes.  Pertinent labs & imaging results that were available during my care of the patient were reviewed by me and considered in my medical decision making (see chart for details).    MDM Rules/Calculators/A&P                           Alert 45 year old female no acute distress, nontoxic appearing.  Patient presents with chief complaint of worsening bilateral flank pain, nausea and vomiting.  Patient reports that symptoms first began on 2/17.  Today patient is complaining of new left lower quadrant abdominal pain.  Per chart review patient was seen at [facility] on that date, noncontrast CT scan showed punctate nonobstructing stone in lower pole of the left kidney.,  No hydronephrosis or ureter  al stones.  UA showed signs of infection, patient was diagnosed with pyelonephritis and started on Keflex. Patient returned to was in the hospital on 2/19 with continued complaints of flank pain.  Renal ultrasound showed kidneys normal in appearance, no obstruction.  Patient antibiotic was changed to Levaquin, patient given pain medication and nausea medication for outpatient setting.  Normoactive bowel sounds.  Abdomen soft, nondistended, tenderness to lower left quadrant, no guarding, no rebound tenderness, no mass, pulsatile mass, ventral hernia or umbilical hernia.  Tenderness to  bilateral CVA.     Final Clinical Impression(s) / ED Diagnoses Final diagnoses:  Flank pain  Non-intractable vomiting with nausea, unspecified vomiting type    Rx / DC Orders ED Discharge Orders    None

## 2020-08-22 NOTE — ED Notes (Signed)
Patient came to the window stating her blood sugar was I offered to check it and patient stated "no she wants a room in the back".  I made the nurse aware

## 2020-08-22 NOTE — ED Notes (Signed)
Pt states she does not want to be stuck for blood while waiting for room. She is requesting [place] IV and blood to be drawn at that time.

## 2020-08-23 ENCOUNTER — Encounter: Payer: Self-pay | Admitting: Neurology

## 2020-08-23 NOTE — Procedures (Signed)
PATIENT'S NAME:  [name], [name] DOB:      09/22/1975      MR#:    [id number]     DATE OF RECORDING: 08/12/2020 REFERRING M.D.:  Chucky May, MD Study Performed:   Baseline Polysomnogram HISTORY: 45 year old woman with a history of diabetes type 1, migraine headaches, mood disorder and borderline obesity, who reports a longstanding history of difficulty initiating and maintaining sleep for years.  She reports increasing daytime somnolence. The patient endorsed the Epworth Sleepiness Scale at 5 points. The patient's weight 167 pounds with a height of 61 (inches), resulting in a BMI of 31.6 kg/m2. The patient's neck circumference measured 14 inches.  CURRENT MEDICATIONS: Adderall, KIT, Klonopin, Decadron, Cymbalta, Neurontin, Emgality, [employer], Tresiba, Mirena, Protonix, Ambien,   PROCEDURE:  This is a multichannel digital polysomnogram utilizing the Somnostar 11.2 system.  Electrodes and sensors were applied and monitored per AASM Specifications.   EEG, EOG, Chin and Limb EMG, were sampled at 200 Hz.  ECG, Snore and Nasal Pressure, Thermal Airflow, Respiratory Effort, CPAP Flow and Pressure, Oximetry was sampled at 50 Hz. Digital video and audio were recorded.      BASELINE STUDY  Lights Out was at 21:11 and Lights On at 04:46.  Total recording time (TRT) was 453.5 minutes, with a total sleep time (TST) of 368.5 minutes.   The patient's sleep latency to persistent sleep was 66 minutes.  REM latency was 365.5 minutes, which is markedly delayed. The sleep efficiency was 81.3 %.     SLEEP ARCHITECTURE: WASO (Wake after sleep onset) was 83.5 minutes with mild to moderate sleep fragmentation noted.  There were 37.5 minutes in Stage N1, 310.5 minutes Stage N2, 5.5 minutes Stage N3 and 15 minutes in Stage REM.  The percentage of Stage N1 was 10.2%, which is increased, Stage N2 was 84.3%, which is markedly increased, Stage N3 was 1.5% and Stage R (REM sleep) was 4.1%, which is markedly reduced. The  arousals were noted as: 36 were spontaneous, 0 were associated with PLMs, 0 were associated with respiratory events.  RESPIRATORY ANALYSIS:  There were a total of 1 respiratory events:  0 obstructive apneas, 0 central apneas and 0 mixed apneas with a total of 0 apneas and an apnea index (AI) of 0 /hour. There were 1 hypopneas with a hypopnea index of .2 /hour. The patient also had 0 respiratory event related arousals (RERAs).      The total APNEA/HYPOPNEA INDEX (AHI) was .2/hour and the total RESPIRATORY DISTURBANCE INDEX was  .2 /hour.  0 events occurred in REM sleep and 2 events in NREM. The REM AHI was  0 /hour, versus a non-REM AHI of .2. The patient spent 0 minutes of total sleep time in the supine position and 369 minutes in non-supine.. The supine AHI was n/a versus a non-supine AHI of 0.2.  OXYGEN SATURATION & C02:  The Wake baseline 02 saturation was 94%, with the lowest being 88% (79% on the technical report was due to an error with the O2 sensor). Time spent below 89% saturation equaled 27 minutes (which is overestimated). PERIODIC LIMB MOVEMENTS: The patient had a total of 0 Periodic Limb Movements.  The Periodic Limb Movement (PLM) index was 0 and the PLM Arousal index was 0/hour.  Audio and video analysis did not show any abnormal or unusual movements, behaviors, phonations or vocalizations. The patient took no bathroom breaks. Snoring was noted, ranging from mild to louder. The EKG was in keeping with normal sinus rhythm (  NSR).  Post-study, the patient indicated that sleep was better than usual.   IMPRESSION:  1. Primary Snoring 2. Dysfunctions associated with sleep stages or arousal from sleep  RECOMMENDATIONS:  1. This study does not demonstrate any significant obstructive or central sleep disordered breathing with the exception of intermittent snoring, which ranged from mild to louder. The absence of supine sleep and low percentage of REM sleep may have underestimated her sleep  disordered breathing to some degree. Treatment with positive airway pressure is not warranted. Weight loss and avoiding the supine sleep position may alleviate her snoring. For disturbing snoring, an oral appliance (through a qualified dentist) can be considered.  2. This study shows sleep fragmentation and abnormal sleep stage percentages; these are nonspecific findings and per se do not signify an intrinsic sleep disorder or a cause for the patient's sleep-related symptoms. Causes include (but are not limited to) the first night effect of the sleep study, circadian rhythm disturbances, medication effect or an underlying mood disorder or medical problem.  3. The patient should be cautioned not to drive, work at heights, or operate dangerous or heavy equipment when tired or sleepy. Review and reiteration of good sleep hygiene measures should be pursued with any patient. 4. The patient will be advised to follow up with the referring provider, who will be notified of the test results.  I certify that I have reviewed the entire raw data recording prior to the issuance of this report in accordance with the Standards of Accreditation of the American Academy of Sleep Medicine (AASM)   Star Age, MD, PhD Diplomat, American Board of Neurology and Sleep Medicine (Neurology and Sleep Medicine)

## 2020-08-23 NOTE — Progress Notes (Signed)
Patient referred by Dr. Evelene Croon, seen by me on 07/20/20, diagnostic PSG on 08/12/20.   Please call and notify the patient that the recent sleep study did not show any significant obstructive sleep apnea with the exception of intermittent snoring, which ranged from mild to louder. The absence of supine sleep and low percentage of REM sleep may have underestimated her sleep disordered breathing to some degree. Treatment with positive airway pressure is not warranted. Weight loss and avoiding the supine sleep position may alleviate her snoring. For disturbing snoring, an oral appliance (through a qualified dentist) can be considered, but will likely not be covered by insurance.  At this juncture, she can FU with Dr. Evelene Croon as scheduled.   Thanks,  Huston Foley, MD, PhD [facility])

## 2020-08-24 ENCOUNTER — Telehealth: Payer: Self-pay

## 2020-08-24 NOTE — Telephone Encounter (Signed)
I called the pt and advised of results of sleep study. She verbalized understanding and will f/u with Dr. Evelene Croon as scheduled.

## 2020-08-24 NOTE — Telephone Encounter (Signed)
-----   Message from Huston Foley, MD sent at 08/23/2020  6:13 PM EST ----- Patient referred by Dr. Evelene Croon, seen by me on 07/20/20, diagnostic PSG on 08/12/20.   Please call and notify the patient that the recent sleep study did not show any significant obstructive sleep apnea with the exception of intermittent snoring, which ranged from mild to louder. The absence of supine sleep and low percentage of REM sleep may have underestimated her sleep disordered breathing to some degree. Treatment with positive airway pressure is not warranted. Weight loss and avoiding the supine sleep position may alleviate her snoring. For disturbing snoring, an oral appliance (through a qualified dentist) can be considered, but will likely not be covered by insurance.  At this juncture, she can FU with Dr. Evelene Croon as scheduled.   Thanks,  Huston Foley, MD, PhD [facility])

## 2020-08-24 NOTE — Telephone Encounter (Signed)
I called the patient and left a voicemail asking for her to call me back so we can review results of sleep study.  Also advised she can send me a MyChart message and I can release the results to her through the portal.

## 2020-08-24 NOTE — Telephone Encounter (Signed)
Pt called back and is okay with a mychart message being sent with the results

## 2020-08-25 LAB — CULTURE, BLOOD (ROUTINE X 2)
Culture: NO GROWTH
Culture: NO GROWTH
Special Requests: ADEQUATE
Special Requests: ADEQUATE

## 2020-09-15 ENCOUNTER — Other Ambulatory Visit: Payer: Self-pay

## 2020-09-15 ENCOUNTER — Ambulatory Visit (INDEPENDENT_AMBULATORY_CARE_PROVIDER_SITE_OTHER): Payer: BC Managed Care – PPO | Admitting: Obstetrics and Gynecology

## 2020-09-15 ENCOUNTER — Encounter: Payer: Self-pay | Admitting: Obstetrics and Gynecology

## 2020-09-15 VITALS — BP 130/82 | HR 119 | Ht 61.0 in | Wt 164.0 lb

## 2020-09-15 DIAGNOSIS — R109 Unspecified abdominal pain: Secondary | ICD-10-CM | POA: Diagnosis not present

## 2020-09-15 DIAGNOSIS — R110 Nausea: Secondary | ICD-10-CM | POA: Diagnosis not present

## 2020-09-15 DIAGNOSIS — R102 Pelvic and perineal pain unspecified side: Secondary | ICD-10-CM

## 2020-09-15 DIAGNOSIS — R10A0 Flank pain, unspecified side: Secondary | ICD-10-CM

## 2020-09-15 DIAGNOSIS — M7918 Myalgia, other site: Secondary | ICD-10-CM | POA: Diagnosis not present

## 2020-09-15 MED ORDER — CYCLOBENZAPRINE HCL 5 MG PO TABS: 5.0000 mg | ORAL_TABLET | Freq: Three times a day (TID) | ORAL | 0 refills | Status: DC | PRN

## 2020-09-15 MED ORDER — IBUPROFEN 800 MG PO TABS: 800.0000 mg | ORAL_TABLET | Freq: Three times a day (TID) | ORAL | 1 refills | Status: DC | PRN

## 2020-09-15 NOTE — Progress Notes (Signed)
GYNECOLOGY  VISIT   HPI: 45 y.o.   Single White or Caucasian Not Hispanic or Latino  female   [id number] with No LMP recorded. (Menstrual status: IUD).   here to discuss ovarian cyst and states that she has 4 kidney stones. She states that she is in a lot of pain. Is nauseated.  Patient say that she hasn't been able to eat for about 30 days. 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   She started having left flank pain on 08/18/20, tremendous pain, went to the ER. Was told she had a stone and a pyelonephritis. She was given antibiotics and pain medication. On 2/19 she was hurting in bilateral flank region, she was told she had bilateral pyelonephritis and her antibiotics were changed. She had a fever up to 102. She has had persistent nausea since 08/18/20. She has been taking zofran. She continues to have bilateral flank pain.   She went back to the ER on 08/22/20 and was told there was no explanation for her pain and sent her out.   She has a mirena IUD, placed in 2/20.   Last HgbA1C was 8.2% on 09/07/20  Last week she was treated for pyelonephritis again, but her urine culture was negative.   Starting 1.5 weeks ago she started having a constant, sharp stabbing pain in her LLQ/left pelvis. Pain ranges from an 8-10/10 in severity. Only relief is to lie completely still and to be on pain and antinausea medication.  She is having normal BM's daily. Nausea is constant, vomiting intermittently. Minimal po intake in the last month, reports 5 lb weight loss.   Not sexually active for the last year. Not sure when she last had STD testing.   08/22/20 CT: IUD in place, normal uterus, unremarkable adnexa.  Normal kidneys  09/09/20 pelvic ultrasound: Normal uterus and right ovary Left ovary with 2 cysts, largest 2.9 cm, probably functional  09/09/20 Renal ultrasound: Non obstructing left kidney stone, normal right kidney   GYNECOLOGIC HISTORY: No LMP recorded. (Menstrual  status: IUD). Contraception: IUD Menopausal hormone therapy: none         OB History    Gravida  0   Para  0   Term  0   Preterm  0   AB  0   Living  0     SAB  0   IAB  0   Ectopic  0   Multiple  0   Live Births  0              Patient Active Problem List   Diagnosis Date Noted  . Dehydration 09/17/2018  . Gastroesophageal reflux disease   . ADHD   . Volume depletion 07/21/2018  . Diabetic ketoacidosis ([place]) 07/21/2018  . Morbid obesity ([place]) 06/16/2018  . Chronic tension-type headache, intractable 06/03/2018  . Multiple thyroid nodules 04/17/2018  . 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 ([place]) 01/01/2018  . DKA, type 1 ([place]) 02/14/2015  . DKA (diabetic ketoacidoses) 02/14/2015  . Thyroid nodule 02/14/2015  . Depression with anxiety 02/14/2015  . Acute kidney injury ([place]) 02/14/2015  . Leukocytosis 02/14/2015  . Increased anion gap metabolic acidosis 86/76/1950  . History of frequent urinary tract infections 01/25/2014  . Anxiety 12/04/2013  . Neuropathic pain 12/04/2013  . Recurrent UTI 12/04/2013  . Aphakia of left eye 06/23/2013  . Retinal detachment 06/23/2013  . PDR (proliferative diabetic retinopathy) ([place]) 06/23/2013  . Recurrent  major depression-severe ([place]) 01/06/2013  . Generalized anxiety disorder 01/06/2013  . Common migraine with intractable migraine 12/05/2012  . IDDM (insulin dependent diabetes mellitus) ([place]) 12/05/2012  . RLQ abdominal pain 11/30/2011    Past Medical History:  Diagnosis Date  . Adopted   . Cluster headache syndrome, intractable   . Depression   . Migraine without aura, with intractable migraine, so stated, without mention of status migrainosus 12/05/2012  . Ovarian cyst   . Sepsis ([place])   . STD (sexually transmitted disease)    HSV II   . Type I diabetes mellitus ([place])     Past Surgical History:  Procedure Laterality Date  . APPENDECTOMY    . BUNIONECTOMY Bilateral   . CHOLECYSTECTOMY    . EYE  SURGERY    . OVARIAN CYST REMOVAL      Current Outpatient Medications  Medication Sig Dispense Refill  . amphetamine-dextroamphetamine (ADDERALL) 20 MG tablet Take 20 mg by mouth 4 (four) times daily.    . Armodafinil 250 MG tablet Take 250 mg by mouth daily.    . blood glucose meter kit and supplies KIT Dispense based on patient and insurance preference. Use up to four times daily as directed. (FOR ICD-9 250.00, 250.01). 1 each 0  . calcium carbonate (TUMS - DOSED IN MG ELEMENTAL CALCIUM) 500 MG chewable tablet Chew 1 tablet by mouth daily as needed for indigestion or heartburn.    . clonazePAM (KLONOPIN) 1 MG tablet Take 1 mg by mouth 4 (four) times daily. For anxiety    . dexamethasone (DECADRON) 2 MG tablet Take 3 tablets the first day, 2 the second and 1 the third day 6 tablet 0  . DULoxetine (CYMBALTA) 60 MG capsule Take 60 mg by mouth daily.    Marland Kitchen gabapentin (NEURONTIN) 300 MG capsule Take 300 mg by mouth 4 (four) times daily.    . Galcanezumab-gnlm (EMGALITY) 120 MG/ML SOAJ Inject 120 mg into the skin every 30 (thirty) days. 1 mL 11  . ibuprofen (ADVIL,MOTRIN) 800 MG tablet Take 1 tablet (800 mg total) by mouth every 8 (eight) hours as needed. (Patient taking differently: Take 800 mg by mouth every 8 (eight) hours as needed for moderate pain.) 30 tablet 1  . Insulin Aspart, w/Niacinamide, (FIASP FLEXTOUCH) 100 UNIT/ML SOPN Inject 1-8 Units into the skin 3 (three) times daily. (Patient taking differently: Inject 1-12 Units into the skin 3 (three) times daily. Per sliding scale, 1 unit for 8 grams of carbs) 5 pen 0  . insulin degludec (TRESIBA) 100 UNIT/ML SOPN FlexTouch Pen Inject 18 Units into the skin at bedtime.    . Insulin Pen Needle 31G X 5 MM MISC 1-8 Units by Does not apply route 3 (three) times daily. 100 each 0  . levonorgestrel (MIRENA) 20 MCG/24HR IUD 1 each by Intrauterine route once. Placed 06/2014    . meloxicam (MOBIC) 15 MG tablet Take 15 mg by mouth daily as needed for  pain.    . Multiple Vitamins-Minerals (MULTIVITAMIN GUMMIES WOMENS) CHEW Chew 1 each by mouth daily.    . ondansetron (ZOFRAN) 4 MG tablet Take 1 tablet (4 mg total) by mouth every 8 (eight) hours as needed for nausea or vomiting. 20 tablet 0  . Rimegepant Sulfate (NURTEC) 75 MG TBDP Take 75 mg by mouth as needed (take 1 at onset of headache, max is 1 tablet in 24 hours). 12 tablet 11  . solifenacin (VESICARE) 10 MG tablet Take 10 mg by mouth daily.    Marland Kitchen  zolpidem (AMBIEN CR) 12.5 MG CR tablet Take 12.5 mg by mouth at bedtime.    . Vitamin D, Ergocalciferol, (DRISDOL) 1.25 MG (50000 UNIT) CAPS capsule Take 50,000 Units by mouth every 7 (seven) days. (Patient not taking: Reported on 09/15/2020)     No current facility-administered medications for this visit.     ALLERGIES: Sulfa antibiotics, Wellbutrin [bupropion], Amoxicillin, and Codeine  Family History  Adopted: Yes  Problem Relation Age of Onset  . Diabetes Father     Social History   Socioeconomic History  . Marital status: Single    Spouse name: Not on file  . Number of children: 0  . Years of education: Not on file  . Highest education level: Some college, no degree  Occupational History    Employer: [employer]  Tobacco Use  . Smoking status: Never Smoker  . Smokeless tobacco: Never Used  Vaping Use  . Vaping Use: Never used  Substance and Sexual Activity  . Alcohol use: Yes    Comment: seldom  . Drug use: No  . Sexual activity: Yes    Partners: Male    Birth control/protection: I.U.D.  Other Topics Concern  . Not on file  Social History Narrative   Patient works at [employer] and lives alone.    She may stay with friends at times.   She has no children and a college education.   Some tea and soda   Right handed    Social Determinants of Health   Financial Resource Strain: Not on file  Food Insecurity: Not on file  Transportation Needs: Not on file  Physical Activity: Not on file  Stress: Not on file   Social Connections: Not on file  Intimate Partner Violence: Not on file    Review of Systems  Gastrointestinal: Positive for abdominal pain, nausea and vomiting.  All other systems reviewed and are negative.   PHYSICAL EXAMINATION:    BP 130/82   Pulse (!) 119   Ht 5' 1" (1.549 m)   Wt 164 lb (74.4 kg)   SpO2 94%   BMI 30.99 kg/m     General appearance: alert, cooperative and appears stated age CVA: mildly tender bilaterally Abdomen: soft, mildly tender either lateral lower quadrant, extremely tender when she palpation on tensed abdominal wall; non distended, no masses,  no organomegaly  Pelvic: External genitalia:  no lesions              Urethra:  normal appearing urethra with no masses, tenderness or lesions              Bartholins and Skenes: normal                 Vagina: normal appearing vagina with normal color and discharge, no lesions              Cervix: no cervical motion tenderness, no lesions and IUD string 2-3 cm              Bimanual Exam:  Uterus:  anteverted, mobile, normal sized, +/- tender              Adnexa: no masses, mildly tender bilaterally              Rectovaginal: Yes.  .  Confirms.              Anus:  normal sphincter tone, no lesions  Pelvic floor: mildly tender bilaterally.  Most tender with palpation of tensed abdominal wall  Chaperone was present for exam.   1. Combined abdominal and pelvic pain Exam c/w MS abdominal wall pain - SURESWAB CT/NG/T. vaginalis  2. Musculoskeletal pain - ibuprofen (ADVIL) 800 MG tablet; Take 1 tablet (800 mg total) by mouth every 8 (eight) hours as needed.  Dispense: 30 tablet; Refill: 1 - cyclobenzaprine (FLEXERIL) 5 MG tablet; Take 1 tablet (5 mg total) by mouth 3 (three) times daily as needed for muscle spasms.  Dispense: 20 tablet; Refill: 0 -discussed use of heat or ice -information given  3. Nausea F/U with GI  4. Flank pain Being managed by primary care.   CC: Junie Spencer, PA-C

## 2020-09-15 NOTE — Patient Instructions (Signed)
Musculoskeletal Pain Musculoskeletal pain refers to aches and pains in your bones, joints, muscles, and the tissues that surround them. This pain can occur in any part of the body. It can last for a short time (acute) or a long time (chronic). A physical exam, lab tests, and imaging studies may be done to find the cause of your musculoskeletal pain. Follow these instructions at home: Lifestyle  Try to control or lower your stress levels. Stress increases muscle tension and can worsen musculoskeletal pain. It is important to recognize when you are anxious or stressed and learn ways to manage it. This may include: ? Meditation or yoga. ? Cognitive or behavioral therapy. ? Acupuncture or massage therapy.  You may continue all activities unless the activities cause more pain. When the pain gets better, slowly resume your normal activities. Gradually increase the intensity and duration of your activities or exercise. Managing pain, stiffness, and swelling  Treatment may include medicines for pain and inflammation that are taken by mouth or applied to the skin. Take over-the-counter and prescription medicines only as told by your health care provider.  When your pain is severe, bed rest may be helpful. Lie or sit in any position that is comfortable, but get out of bed and walk around at least every couple of hours.  If directed, apply heat to the affected area as often as told by your health care provider. Use the heat source that your health care provider recommends, such as a moist heat pack or a heating pad. ? Place a towel between your skin and the heat source. ? Leave the heat on for 20-30 minutes. ? Remove the heat if your skin turns bright red. This is especially important if you are unable to feel pain, heat, or cold. You may have a greater risk of getting burned.  If directed, put ice on the painful area. To do this: ? Put ice in a plastic bag. ? Place a towel between your skin and the  bag. ? Leave the ice on for 20 minutes, 2-3 times a day. ? Remove the ice if your skin turns bright red. This is very important. If you cannot feel pain, heat, or cold, you have a greater risk of damage to the area.      General instructions  Your health care provider may recommend that you see a physical therapist. This person can help you come up with a safe exercise program.  If told by your health care provider, do physical therapy exercises to improve movement and strength in the affected area.  Keep all follow-up visits. This is important. This includes any physical therapy visits. Contact a health care provider if:  Your pain gets worse.  Medicines do not help ease your pain.  You cannot use the part of your body that hurts, such as your arm, leg, or neck.  You have trouble sleeping.  You have trouble doing your normal activities. Get help right away if:  You have a new injury and your pain is worse or different.  You feel numb or you have tingling in the painful area. Summary  Musculoskeletal pain refers to aches and pains in your bones, joints, muscles, and the tissues that surround them.  This pain can occur in any part of the body.  Your health care provider may recommend that you see a physical therapist. This person can help you come up with a safe exercise program. Do any exercises as told by your physical therapist.    Lower your stress level. Stress can worsen musculoskeletal pain. Ways to lower stress may include meditation, yoga, cognitive or behavioral therapy, acupuncture, and massage therapy. This information is not intended to replace advice given to you by your health care provider. Make sure you discuss any questions you have with your health care provider. Document Revised: 10/22/2019 Document Reviewed: 09/30/2019 Elsevier Patient Education  2021 Elsevier Inc.  

## 2020-09-16 LAB — SURESWAB CT/NG/T. VAGINALIS
C. trachomatis RNA, TMA: NOT DETECTED
N. gonorrhoeae RNA, TMA: NOT DETECTED
Trichomonas vaginalis RNA: NOT DETECTED

## 2020-09-22 ENCOUNTER — Encounter: Payer: Self-pay | Admitting: Neurology

## 2020-09-22 MED ORDER — EMGALITY 120 MG/ML ~~LOC~~ SOAJ: SUBCUTANEOUS | 3 refills | Status: DC

## 2020-09-26 ENCOUNTER — Encounter: Payer: Self-pay | Admitting: Internal Medicine

## 2020-09-26 ENCOUNTER — Other Ambulatory Visit: Payer: Self-pay

## 2020-09-26 ENCOUNTER — Ambulatory Visit (INDEPENDENT_AMBULATORY_CARE_PROVIDER_SITE_OTHER): Payer: BC Managed Care – PPO | Admitting: Internal Medicine

## 2020-09-26 VITALS — BP 118/82 | HR 111 | Temp 97.9°F | Ht 61.0 in | Wt 166.2 lb

## 2020-09-26 DIAGNOSIS — R053 Chronic cough: Secondary | ICD-10-CM

## 2020-09-26 DIAGNOSIS — K219 Gastro-esophageal reflux disease without esophagitis: Secondary | ICD-10-CM | POA: Diagnosis not present

## 2020-09-26 MED ORDER — PANTOPRAZOLE SODIUM 40 MG PO TBEC: 40.0000 mg | DELAYED_RELEASE_TABLET | Freq: Two times a day (BID) | ORAL | 3 refills | Status: DC

## 2020-09-26 NOTE — Patient Instructions (Signed)
The patient should have follow up scheduled with myself in 8 weeks  Prior to next visit patient should have: Spirometry/Feno - next available, 30 minutes.   Start taking protonix 1 pill twice a day.

## 2020-09-26 NOTE — Progress Notes (Signed)
[name]    [id number]    [birth year]  Primary Care Physician:Gordon, Gaye Alken, PA-C  Referring Physician: Jordan Hawks, PA-C [address] [place],  [place] [address] Reason for Consultation: chronic cough Date of Consultation: 09/26/2020  Chief complaint:   Chief Complaint  Patient presents with  . Consult    Dry cough since 2017     HPI: [name] is a 45 y.o. woman with chronic cough here for new patient evaluation.  She was diagnosed with bronchitis in 2017 (the day she was driving to [facility] from [place].)  She has been to the emergency room twice for difficulty breathing. Treated with antibiotics and oxygen. She has never been prescribed any inhalers. Has been prescribed flonase in the past. She doesn't think it helped.   She has chronic dry cough and constantly needs something to sip on for moisture. The cough does wake her up at night.   She denies shortness of breath, chest tightness, wheezing, hoarse voice, itchy watery eyes. She does have gastric ulcers and has been on protonix in the past - does have some break through reflux when she eats pasta sauce. She stopped protonix about a year and no worsening in cough. She was on PPI when her symptoms started.  She has never been on lisinopril.  She does clear her throat frequently and cough is made worse with talking.  Social history:  Occupation: Runner, broadcasting/film/video Exposures: lives at home with boyfriend and dog.  Smoking history: never smoker,   Social History   Occupational History    Employer: [employer]  Tobacco Use  . Smoking status: Never Smoker  . Smokeless tobacco: Never Used  Vaping Use  . Vaping Use: Never used  Substance and Sexual Activity  . Alcohol use: Yes    Comment: seldom  . Drug use: No  . Sexual activity: Yes    Partners: Male    Birth control/protection: I.U.D.    Relevant family history: Family History  Adopted: Yes  Problem Relation Age of Onset  .  Diabetes Father     Past Medical History:  Diagnosis Date  . Adopted   . Cluster headache syndrome, intractable   . Depression   . Migraine without aura, with intractable migraine, so stated, without mention of status migrainosus 12/05/2012  . Ovarian cyst   . Sepsis ([facility])   . STD (sexually transmitted disease)    HSV II   . Type I diabetes mellitus ([facility])     Past Surgical History:  Procedure Laterality Date  . APPENDECTOMY    . BUNIONECTOMY Bilateral   . CHOLECYSTECTOMY    . EYE SURGERY    . OVARIAN CYST REMOVAL       Physical Exam: Blood pressure 118/82, pulse (!) 111, temperature 97.9 F (36.6 C), temperature source Temporal, height 5\' 1"  (1.549 m), weight 166 lb 3.2 oz (75.4 kg), SpO2 100 %. Gen:      No acute distress ENT:  Mild cobblestoning, no nasal polyps, mucus membranes moist Lungs:    No increased respiratory effort, symmetric chest wall excursion, clear to auscultation bilaterally, no wheezes or crackles CV:         Regular rate and rhythm; no murmurs, rubs, or gallops.  No pedal edema Abd:      + bowel sounds; soft, non-tender; no distension MSK: no acute synovitis of DIP or PIP joints, no mechanics hands.  Skin:  Warm and dry; no rashes Neuro: normal speech, no focal facial asymmetry Psych: alert and oriented x3, normal mood and affect   Data Reviewed/Medical Decision Making:  Independent interpretation of tests: Imaging: . Review of patient's chest xray images Sept 2020 revealed no acute process. The patient's images have been independently reviewed by me.    PFTs: None on file  Labs:  Lab Results  Component Value Date   WBC 8.2 08/22/2020   HGB 14.7 08/22/2020   HCT 44.6 08/22/2020   MCV 90.7 08/22/2020   PLT 437 (H) 08/22/2020   Lab Results  Component Value Date   NA 140 08/22/2020   K 4.0 08/22/2020   CL 106 08/22/2020   CO2 26 08/22/2020     Immunization status:  Immunization History  Administered Date(s) Administered  .  Hepatitis B, adult 01/25/2017  . Influenza Inj Mdck Quad Pf 08/01/2018  . Influenza,inj,quad, With Preservative 08/01/2018  . PFIZER(Purple Top)SARS-COV-2 Vaccination 09/04/2019, 10/01/2019, 08/09/2020  . PPD Test 01/23/2017, 08/13/2018  . Tdap 11/22/2010    . I reviewed prior external note(s) from primary care, ED . I reviewed the result(s) of the labs and imaging as noted above.  . I have ordered PFTs   Assessment:  Chronic cough  Plan/Recommendations: Differential diagnosis includes GERD, post nasal drainage, neurogenic cough. Cannot exclude cough variant asthma or rhinitis. GERD most likely given her previous history of ulcers and nocturnal worsening. Will start BID PPI and obtain spirometry/Exhaled NO at next visit. I will see her back after 8 week trial.    Return to Care: Return in about 8 weeks (around 11/21/2020).  Durel Salts, MD Pulmonary and Critical Care Medicine [facility] Office:[phone]  CC: Jordan Hawks, [place]

## 2020-09-28 ENCOUNTER — Other Ambulatory Visit: Payer: Self-pay | Admitting: *Deleted

## 2020-09-28 DIAGNOSIS — R053 Chronic cough: Secondary | ICD-10-CM

## 2020-09-29 ENCOUNTER — Other Ambulatory Visit: Payer: Self-pay

## 2020-09-29 ENCOUNTER — Ambulatory Visit (INDEPENDENT_AMBULATORY_CARE_PROVIDER_SITE_OTHER): Payer: BC Managed Care – PPO | Admitting: Internal Medicine

## 2020-09-29 DIAGNOSIS — R053 Chronic cough: Secondary | ICD-10-CM

## 2020-09-29 LAB — PULMONARY FUNCTION TEST
FEF 25-75 Pre: 2.89 L/sec
FEF2575-%Pred-Pre: 102 %
FEV1-%Pred-Pre: 103 %
FEV1-Pre: 2.76 L
FEV1FVC-%Pred-Pre: 106 %
FEV6-%Pred-Pre: 98 %
FEV6-Pre: 3.17 L
FEV6FVC-%Pred-Pre: 102 %
FVC-%Pred-Pre: 96 %
FVC-Pre: 3.17 L
Pre FEV1/FVC ratio: 87 %
Pre FEV6/FVC Ratio: 100 %

## 2020-09-29 LAB — POCT EXHALED NITRIC OXIDE: FeNO level (ppb): 14

## 2020-09-29 NOTE — Progress Notes (Signed)
Spirometry/Feno performed today. 

## 2020-09-29 NOTE — Telephone Encounter (Signed)
Breathing test was normal.  If she is still coughing?Marland Kitchen  Usually these things settle down how bad is it?  Can she manage without any medication?    If needed she can do a 5-day prednisone - Please take prednisone 40 mg x1 day, then 30 mg x1 day, then 20 mg x1 day, then 10 mg x1 day, and then 5 mg x1 day and stop

## 2020-09-29 NOTE — Telephone Encounter (Signed)
Dr Marchelle Gearing,  We received this message from patient on mychart:  I have not been able to stop coughing since I did the breathing test this morning. Is there anything you can prescribe to stop the cough?  Dr Celine Mans is not available this afternoon. Please advise.

## 2020-09-29 NOTE — Patient Instructions (Signed)
Spirometry/Feno performed today. 

## 2020-09-30 MED ORDER — BENZONATATE 200 MG PO CAPS: 200.0000 mg | ORAL_CAPSULE | Freq: Two times a day (BID) | ORAL | 0 refills | Status: DC | PRN

## 2020-09-30 NOTE — Telephone Encounter (Signed)
Spoke with pt, states she cannot take prednisone. States she is a type 1 diabetic and this will raise her glucose to an unsafe level. Requesting additional recommendations.   MR please advise, thanks!

## 2020-09-30 NOTE — Telephone Encounter (Signed)
Pt returning a phone call. Pt can be reached at [phone].

## 2020-09-30 NOTE — Telephone Encounter (Signed)
LMTCB for the pt 

## 2020-09-30 NOTE — Telephone Encounter (Signed)
Pt returning a phone call. Pt can be reached at [phone]. 

## 2020-09-30 NOTE — Telephone Encounter (Signed)
Can try Tessalon cough Perles 200 mg twice daily as needed.  Give her a 5-day supply with 1 refill

## 2020-09-30 NOTE — Telephone Encounter (Signed)
Called and spoke with patient. She is ok with trying the Tessalon perles. Will go ahead and send these into the pharmacy for her. Nothing further needed at time of call.

## 2020-10-13 ENCOUNTER — Telehealth: Payer: Self-pay

## 2020-10-13 NOTE — Telephone Encounter (Signed)
PA request for [facility] sent to [facility] on CMM, Key: [id number].  Your demographic data has been sent to [facility] successfully!  [facility] typically takes 5-10 minutes to respond, but it may take a little longer in some cases.

## 2020-10-13 NOTE — Telephone Encounter (Signed)
Your PA has been resolved, no additional PA is required. For further inquiries please contact the number on the back of the member prescription card.

## 2020-10-25 ENCOUNTER — Telehealth: Payer: Self-pay | Admitting: Emergency Medicine

## 2020-10-25 NOTE — Telephone Encounter (Signed)
PA submitted for Bernita Raisin to [facility] Key  [id number] Awaiting determination from [facility]

## 2020-10-27 NOTE — Telephone Encounter (Signed)
PA cancelled, Ubrelvy not effective.  Trying Nurtec.

## 2020-11-09 ENCOUNTER — Telehealth: Payer: Self-pay | Admitting: Neurology

## 2020-11-09 NOTE — Telephone Encounter (Signed)
Pt called, need some information to fillout application for assistance with cost for my Galcanezumab-gnlm (EMGALITY) 120 MG/ML SOAJ. Would like a call from the nurse.

## 2020-11-09 NOTE — Telephone Encounter (Signed)
I called pt and she is doing her application for PAP [employer] Emgality online.  Asking for email. (mandatory). I gave her my email.  She will use that and see what happens.

## 2020-11-10 NOTE — Telephone Encounter (Addendum)
Received Email for [employer] [id number]. PAP for Emgality.  I completed printed form for MD, and to be signed and faxed.

## 2020-11-16 ENCOUNTER — Other Ambulatory Visit: Payer: Self-pay | Admitting: *Deleted

## 2020-11-16 MED ORDER — EMGALITY 120 MG/ML ~~LOC~~ SOAJ: SUBCUTANEOUS | 3 refills | Status: DC

## 2020-11-16 NOTE — Telephone Encounter (Signed)
Fax confirmation to [facility] for [employer] (prescription for pt).  PAE # [id number]. [id number], of [phone].

## 2020-11-17 NOTE — Telephone Encounter (Signed)
Signed and faxed with confirmation to [employer] for PAP Emgality.

## 2020-11-24 NOTE — Telephone Encounter (Addendum)
Spoke to [employer].  I refaxed prescription the the emgality prefilled pen added with confirmation.  Pt needs to electronically sign her application and if not able then can call or do paper application pages 2, 4.  Left message voicemail for patient regarding what they need from her an electronic signature or or doing pages 2 and 4 of the paper application which she can download I did also give her their phone number she is to call back for questions.

## 2020-11-29 NOTE — Telephone Encounter (Signed)
I received a fax from [facility].  Patient meets program eligibility requirements and is enrolled in [facility] for 77-month.

## 2020-12-09 ENCOUNTER — Emergency Department (HOSPITAL_COMMUNITY): Payer: Self-pay

## 2020-12-09 ENCOUNTER — Encounter (HOSPITAL_COMMUNITY): Payer: Self-pay

## 2020-12-09 ENCOUNTER — Other Ambulatory Visit: Payer: Self-pay

## 2020-12-09 ENCOUNTER — Inpatient Hospital Stay (HOSPITAL_COMMUNITY)
Admission: EM | Admit: 2020-12-09 | Discharge: 2020-12-11 | DRG: 638 | Disposition: A | Discharge status: Home / Self Care | Payer: Self-pay | Attending: Family Medicine | Admitting: Family Medicine

## 2020-12-09 DIAGNOSIS — N179 Acute kidney failure, unspecified: Secondary | ICD-10-CM | POA: Diagnosis present

## 2020-12-09 DIAGNOSIS — Z882 Allergy status to sulfonamides status: Secondary | ICD-10-CM

## 2020-12-09 DIAGNOSIS — E1069 Type 1 diabetes mellitus with other specified complication: Secondary | ICD-10-CM | POA: Diagnosis present

## 2020-12-09 DIAGNOSIS — R053 Chronic cough: Secondary | ICD-10-CM

## 2020-12-09 DIAGNOSIS — E041 Nontoxic single thyroid nodule: Secondary | ICD-10-CM | POA: Diagnosis present

## 2020-12-09 DIAGNOSIS — Z888 Allergy status to other drugs, medicaments and biological substances status: Secondary | ICD-10-CM

## 2020-12-09 DIAGNOSIS — E1010 Type 1 diabetes mellitus with ketoacidosis without coma: Principal | ICD-10-CM | POA: Diagnosis present

## 2020-12-09 DIAGNOSIS — Z794 Long term (current) use of insulin: Secondary | ICD-10-CM

## 2020-12-09 DIAGNOSIS — Z885 Allergy status to narcotic agent status: Secondary | ICD-10-CM

## 2020-12-09 DIAGNOSIS — F909 Attention-deficit hyperactivity disorder, unspecified type: Secondary | ICD-10-CM | POA: Diagnosis present

## 2020-12-09 DIAGNOSIS — Z833 Family history of diabetes mellitus: Secondary | ICD-10-CM

## 2020-12-09 DIAGNOSIS — Z9641 Presence of insulin pump (external) (internal): Secondary | ICD-10-CM | POA: Diagnosis present

## 2020-12-09 DIAGNOSIS — F332 Major depressive disorder, recurrent severe without psychotic features: Secondary | ICD-10-CM | POA: Diagnosis present

## 2020-12-09 DIAGNOSIS — M792 Neuralgia and neuritis, unspecified: Secondary | ICD-10-CM | POA: Diagnosis present

## 2020-12-09 DIAGNOSIS — E875 Hyperkalemia: Secondary | ICD-10-CM | POA: Diagnosis present

## 2020-12-09 DIAGNOSIS — K219 Gastro-esophageal reflux disease without esophagitis: Secondary | ICD-10-CM | POA: Diagnosis present

## 2020-12-09 DIAGNOSIS — F411 Generalized anxiety disorder: Secondary | ICD-10-CM | POA: Diagnosis present

## 2020-12-09 DIAGNOSIS — G43019 Migraine without aura, intractable, without status migrainosus: Secondary | ICD-10-CM | POA: Diagnosis present

## 2020-12-09 DIAGNOSIS — R000 Tachycardia, unspecified: Secondary | ICD-10-CM | POA: Diagnosis present

## 2020-12-09 DIAGNOSIS — Z20822 Contact with and (suspected) exposure to covid-19: Secondary | ICD-10-CM | POA: Diagnosis present

## 2020-12-09 DIAGNOSIS — E785 Hyperlipidemia, unspecified: Secondary | ICD-10-CM | POA: Diagnosis present

## 2020-12-09 DIAGNOSIS — Z881 Allergy status to other antibiotic agents status: Secondary | ICD-10-CM

## 2020-12-09 DIAGNOSIS — Z79899 Other long term (current) drug therapy: Secondary | ICD-10-CM

## 2020-12-09 DIAGNOSIS — Z9049 Acquired absence of other specified parts of digestive tract: Secondary | ICD-10-CM

## 2020-12-09 HISTORY — DX: Anxiety disorder, unspecified: F41.9

## 2020-12-09 HISTORY — DX: Migraine, unspecified, not intractable, without status migrainosus: G43.909

## 2020-12-09 LAB — GLUCOSE, CAPILLARY
Glucose-Capillary: 377 mg/dL — ABNORMAL HIGH (ref 70–99)
Glucose-Capillary: 326 mg/dL — ABNORMAL HIGH (ref 70–99)
Glucose-Capillary: 257 mg/dL — ABNORMAL HIGH (ref 70–99)
Glucose-Capillary: 232 mg/dL — ABNORMAL HIGH (ref 70–99)
Glucose-Capillary: 244 mg/dL — ABNORMAL HIGH (ref 70–99)

## 2020-12-09 LAB — URINALYSIS, ROUTINE W REFLEX MICROSCOPIC
Bacteria, UA: NONE SEEN
Bilirubin Urine: NEGATIVE
Glucose, UA: >=500 mg/dL — AB
Ketones, ur: 80 mg/dL — AB
Leukocytes,Ua: NEGATIVE
Nitrite: NEGATIVE
Protein, ur: NEGATIVE mg/dL
Specific Gravity, Urine: 1.017 (ref 1.005–1.030)
pH: 5 (ref 5.0–8.0)

## 2020-12-09 LAB — BASIC METABOLIC PANEL
Anion gap: 27 — ABNORMAL HIGH (ref 5–15)
Anion gap: 23 — ABNORMAL HIGH (ref 5–15)
Anion gap: 21 — ABNORMAL HIGH (ref 5–15)
Anion gap: 15 (ref 5–15)
BUN: 34 mg/dL — ABNORMAL HIGH (ref 6–20)
BUN: 25 mg/dL — ABNORMAL HIGH (ref 6–20)
BUN: 34 mg/dL — ABNORMAL HIGH (ref 6–20)
BUN: 29 mg/dL — ABNORMAL HIGH (ref 6–20)
CO2: 9 mmol/L — ABNORMAL LOW (ref 22–32)
CO2: 7 mmol/L — ABNORMAL LOW (ref 22–32)
CO2: 11 mmol/L — ABNORMAL LOW (ref 22–32)
CO2: 12 mmol/L — ABNORMAL LOW (ref 22–32)
Calcium: 9.7 mg/dL (ref 8.9–10.3)
Calcium: 8.1 mg/dL — ABNORMAL LOW (ref 8.9–10.3)
Calcium: 9.2 mg/dL (ref 8.9–10.3)
Calcium: 9.3 mg/dL (ref 8.9–10.3)
Chloride: 98 mmol/L (ref 98–111)
Chloride: 104 mmol/L (ref 98–111)
Chloride: 109 mmol/L (ref 98–111)
Chloride: 112 mmol/L — ABNORMAL HIGH (ref 98–111)
Creatinine, Ser: 1.65 mg/dL — ABNORMAL HIGH (ref 0.44–1.00)
Creatinine, Ser: 1.05 mg/dL — ABNORMAL HIGH (ref 0.44–1.00)
Creatinine, Ser: 1.39 mg/dL — ABNORMAL HIGH (ref 0.44–1.00)
Creatinine, Ser: 1.23 mg/dL — ABNORMAL HIGH (ref 0.44–1.00)
GFR, Estimated: 39 mL/min — ABNORMAL LOW (ref >60)
GFR, Estimated: >60 mL/min (ref >60)
GFR, Estimated: 48 mL/min — ABNORMAL LOW (ref >60)
GFR, Estimated: 56 mL/min — ABNORMAL LOW (ref >60)
Glucose, Bld: 581 mg/dL (ref 70–99)
Glucose, Bld: 394 mg/dL — ABNORMAL HIGH (ref 70–99)
Glucose, Bld: 354 mg/dL — ABNORMAL HIGH (ref 70–99)
Glucose, Bld: 233 mg/dL — ABNORMAL HIGH (ref 70–99)
Potassium: 5.7 mmol/L — ABNORMAL HIGH (ref 3.5–5.1)
Potassium: 4.8 mmol/L (ref 3.5–5.1)
Potassium: 4.4 mmol/L (ref 3.5–5.1)
Potassium: 5.2 mmol/L — ABNORMAL HIGH (ref 3.5–5.1)
Sodium: 134 mmol/L — ABNORMAL LOW (ref 135–145)
Sodium: 134 mmol/L — ABNORMAL LOW (ref 135–145)
Sodium: 141 mmol/L (ref 135–145)
Sodium: 139 mmol/L (ref 135–145)

## 2020-12-09 LAB — TROPONIN I (HIGH SENSITIVITY)
Troponin I (High Sensitivity): 7 ng/L (ref <18)
Troponin I (High Sensitivity): 6 ng/L (ref <18)
Troponin I (High Sensitivity): 12 ng/L (ref <18)

## 2020-12-09 LAB — CBG MONITORING, ED
Glucose-Capillary: 420 mg/dL — ABNORMAL HIGH (ref 70–99)
Glucose-Capillary: 464 mg/dL — ABNORMAL HIGH (ref 70–99)
Glucose-Capillary: 567 mg/dL (ref 70–99)
Glucose-Capillary: 525 mg/dL (ref 70–99)
Glucose-Capillary: 494 mg/dL — ABNORMAL HIGH (ref 70–99)
Glucose-Capillary: 454 mg/dL — ABNORMAL HIGH (ref 70–99)

## 2020-12-09 LAB — CBC
HCT: 47 % — ABNORMAL HIGH (ref 36.0–46.0)
Hemoglobin: 14.4 g/dL (ref 12.0–15.0)
MCH: 29.6 pg (ref 26.0–34.0)
MCHC: 30.6 g/dL (ref 30.0–36.0)
MCV: 96.7 fL (ref 80.0–100.0)
Platelets: 474 10*3/uL — ABNORMAL HIGH (ref 150–400)
RBC: 4.86 MIL/uL (ref 3.87–5.11)
RDW: 12.8 % (ref 11.5–15.5)
WBC: 22.7 10*3/uL — ABNORMAL HIGH (ref 4.0–10.5)
nRBC: 0 % (ref 0.0–0.2)

## 2020-12-09 LAB — BLOOD GAS, VENOUS
Acid-base deficit: 21 mmol/L — ABNORMAL HIGH (ref 0.0–2.0)
Bicarbonate: 10.1 mmol/L — ABNORMAL LOW (ref 20.0–28.0)
O2 Saturation: 71.8 %
Patient temperature: 98.6
pCO2, Ven: 40.7 mmHg — ABNORMAL LOW (ref 44.0–60.0)
pH, Ven: 7.025 — CL (ref 7.250–7.430)
pO2, Ven: 53 mmHg — ABNORMAL HIGH (ref 32.0–45.0)

## 2020-12-09 LAB — RESP PANEL BY RT-PCR (FLU A&B, COVID) ARPGX2
Influenza A by PCR: NEGATIVE
Influenza B by PCR: NEGATIVE
SARS Coronavirus 2 by RT PCR: NEGATIVE

## 2020-12-09 LAB — BETA-HYDROXYBUTYRIC ACID
Beta-Hydroxybutyric Acid: >8 mmol/L — ABNORMAL HIGH (ref 0.05–0.27)
Beta-Hydroxybutyric Acid: 4.15 mmol/L — ABNORMAL HIGH (ref 0.05–0.27)

## 2020-12-09 LAB — HIV ANTIBODY (ROUTINE TESTING W REFLEX): HIV Screen 4th Generation wRfx: NONREACTIVE

## 2020-12-09 LAB — MRSA PCR SCREENING: MRSA by PCR: NEGATIVE

## 2020-12-09 LAB — I-STAT BETA HCG BLOOD, ED (MC, WL, AP ONLY): I-stat hCG, quantitative: 6.3 m[IU]/mL — ABNORMAL HIGH (ref <5)

## 2020-12-09 MED ORDER — DEXTROSE IN LACTATED RINGERS 5 % IV SOLN: INTRAVENOUS | Status: DC

## 2020-12-09 MED ORDER — DEXTROSE 50 % IV SOLN: 0.0000 mL | INTRAVENOUS | Status: DC | PRN

## 2020-12-09 MED ORDER — LACTATED RINGERS IV SOLN: INTRAVENOUS | Status: DC

## 2020-12-09 MED ORDER — CHLORHEXIDINE GLUCONATE CLOTH 2 % EX PADS
6.0000 | MEDICATED_PAD | Freq: Every day | CUTANEOUS | Status: DC
  Administered 2020-12-09 – 2020-12-11 (×2): 6 via TOPICAL

## 2020-12-09 MED ORDER — FENTANYL CITRATE (PF) 100 MCG/2ML IJ SOLN
50.0000 ug | Freq: Once | INTRAMUSCULAR | Status: AC
Start: 2020-12-09 — End: 2020-12-09
  Administered 2020-12-09: 50 ug via INTRAVENOUS
  Filled 2020-12-09: qty 2

## 2020-12-09 MED ORDER — ACETAMINOPHEN 325 MG PO TABS
650.0000 mg | ORAL_TABLET | Freq: Four times a day (QID) | ORAL | Status: DC | PRN
  Administered 2020-12-09: 650 mg via ORAL
  Filled 2020-12-09: qty 2

## 2020-12-09 MED ORDER — DOCUSATE SODIUM 100 MG PO CAPS: 100.0000 mg | ORAL_CAPSULE | Freq: Two times a day (BID) | ORAL | Status: DC | PRN

## 2020-12-09 MED ORDER — METOPROLOL TARTRATE 12.5 MG HALF TABLET
12.5000 mg | ORAL_TABLET | Freq: Two times a day (BID) | ORAL | Status: DC
  Administered 2020-12-09 – 2020-12-11 (×4): 12.5 mg via ORAL
  Filled 2020-12-09 (×4): qty 1

## 2020-12-09 MED ORDER — ONDANSETRON 4 MG PO TBDP
4.0000 mg | ORAL_TABLET | Freq: Once | ORAL | Status: AC
  Administered 2020-12-09: 4 mg via ORAL
  Filled 2020-12-09: qty 1

## 2020-12-09 MED ORDER — INSULIN REGULAR(HUMAN) IN NACL 100-0.9 UT/100ML-% IV SOLN
INTRAVENOUS | Status: DC
  Administered 2020-12-09: 7.5 [IU]/h via INTRAVENOUS
  Filled 2020-12-09: qty 100

## 2020-12-09 MED ORDER — LACTATED RINGERS IV BOLUS
20.0000 mL/kg | Freq: Once | INTRAVENOUS | Status: AC
  Administered 2020-12-09: 1470 mL via INTRAVENOUS

## 2020-12-09 MED ORDER — INSULIN REGULAR(HUMAN) IN NACL 100-0.9 UT/100ML-% IV SOLN: INTRAVENOUS | Status: DC

## 2020-12-09 MED ORDER — ONDANSETRON HCL 4 MG/2ML IJ SOLN
4.0000 mg | Freq: Once | INTRAMUSCULAR | Status: AC
  Administered 2020-12-09: 4 mg via INTRAVENOUS
  Filled 2020-12-09: qty 2

## 2020-12-09 MED ORDER — ONDANSETRON HCL 4 MG/2ML IJ SOLN
4.0000 mg | Freq: Four times a day (QID) | INTRAMUSCULAR | Status: DC | PRN
  Administered 2020-12-10: 4 mg via INTRAVENOUS
  Filled 2020-12-09: qty 2

## 2020-12-09 MED ORDER — ENOXAPARIN SODIUM 40 MG/0.4ML IJ SOSY
40.0000 mg | PREFILLED_SYRINGE | INTRAMUSCULAR | Status: DC
  Administered 2020-12-09 – 2020-12-10 (×2): 40 mg via SUBCUTANEOUS
  Filled 2020-12-09 (×2): qty 0.4

## 2020-12-09 NOTE — ED Triage Notes (Signed)
Patient c/o N/V, hyperglycemia, SOB, and chest pain since 0500 today.  CBG-420 in triage. Patient is currently wearing an insulin pump.

## 2020-12-09 NOTE — Progress Notes (Signed)
Inpatient Diabetes Program Recommendations  AACE/ADA: New Consensus Statement on Inpatient Glycemic Control (2015)  Target Ranges:  Prepandial:   less than 140 mg/dL      Peak postprandial:   less than 180 mg/dL (1-2 hours)      Critically ill patients:  140 - 180 mg/dL   Lab Results  Component Value Date   GLUCAP 464 (H) 12/09/2020   HGBA1C 7.5 (H) 09/17/2018    Review of Glycemic Control  Diabetes history: DM1 Outpatient Diabetes medications: Tresiba 20 units along with Inpen Fiasp insulin pump 1 unit per 8 grams Carbohydrate + Symlin 60 mg ac meals tid Current orders for Inpatient glycemic control: Starting IV insulin  Inpatient Diabetes Program Recommendations:  Spoke with Nurse Thayer Ohm @ Dr. Lacie Draft office to assure current dosing of insulin was correct and update patient is currently in the emergency room. Attempted to see pt, but patient vomiting and unable to discuss diabetes.   Per Dr. Lacie Draft office note on 09/26/20: "Uncontrolled Type 1 Diabetes: Hgb A1C 8.2% on 09/07/20 labs. Charnetta was started on Symlin 60mg  TID in January at her request to help lower post-prandial glucoses. She was doing better until [facility] when she began having painful recurrent kidney stones and UTIs. Since then her blood sugars have been elevated including trending upward to the 300s overnight. Her 14 day average glucose on her Dexcom G6 is 214 with 38% time in target range. Neshia's fasting insulin requirements have gone up due to the stress from chronic pain and infections. She will raise her Tresiba Flextouch from 16 to 20 units daily and increase her dose by 2 units every 3-4 days until fasting blood sugars are steady and/or under 130. She will continue Fiasp via InPen smart pen at 1 unit per 8 grams carbohydrates (changed from 1:5 after starting Symlin) along with Symlin 60mg  TID. Lastly, Sapir quit her job and will be losing her health insurance at the end of this month. She will be applying for [facility] Patient Assistance program to get and [place] for free and see if she's eligible to get more affordable Dexcom supplies. She will contact [employer] to get a replacement InPen device."  Thank you, Evaristo Bury. Salvator Seppala, RN, MSN, CDE  Diabetes Coordinator Inpatient Glycemic Control Team Team Pager [phone] (8am-5pm) 12/09/2020 2:50 PM

## 2020-12-09 NOTE — ED Notes (Signed)
Patient refusing blood draw in triage. Requesting ultrasound IV with lab draw.

## 2020-12-09 NOTE — ED Provider Notes (Signed)
45 yo F I received in signout from Dr. Bernette Mayers, high likelihood of diabetic ketoacidosis signed out to me awaiting blood work to return.  Metabolic panel has returned with a bicarb of 9 anion gap beta hydroxybutyric acid elevated.  Started on an insulin infusion.  Will discuss with medicine for admission.  On repeat assessment the patient feels mildly nauseated.  States that she has been throwing up today.  Has had trouble with insulin pumps in the past and recently switched from the pen to the pump and she thinks it is because of her symptoms.  Denies infectious symptoms.     Melene Plan, DO 12/09/20 1534

## 2020-12-09 NOTE — ED Notes (Signed)
Sheets changed. Pt on purwick

## 2020-12-09 NOTE — ED Notes (Signed)
567 mg/dL CBG

## 2020-12-09 NOTE — ED Notes (Signed)
Pt does not want blood drawn at this time. 

## 2020-12-09 NOTE — H&P (Signed)
History and Physical    BRANDICE BUSSER RPR:[id number] DOB: 02-15-1976 DOA: 12/09/2020  PCP: Mindi Curling, PA-C   Patient coming from:  Home  I have personally briefly reviewed patient's old medical records in [place]  Chief Complaint: Nausea vomiting and abdominal pain.  HPI: [name] is a 45 y.o. female with medical history significant of type 1 diabetes on insulin pump, major depression, chronic anxiety disorder, neuropathic pain, ADHD, recurrent hospitalizations for DKA presented in the ED with 1 day history of nausea, vomiting and abdominal pain.  Patient reports she was on insulin pump which was discontinued and recently restarted 4 weeks ago,  her blood sugar has been improving and under control.  She woke up with nausea , vomiting and abdominal pain since morning, she checked her blood sugars which were in 500s, she has thrown up several times which made her weak and tired and fatigued.  She reports having problems managing insulin pump.  She denies any fever, chills, sore throat, diarrhea, cough, urinary symptoms, recent travel, sick contacts.  He denies any recent infection, she reports being compliant with her diabetic medications.  ED Course: She was tachycardic other vitals were stable in the ER. HR 124, BP 122/101, RR 18, SPO2 98% on RA, Temp 98.4 Labs: BMP: Sodium 134, potassium 5.7, chloride 98, bicarb 9, glucose 581, BUN 34, creatinine 1.65, calcium 9.7, anion gap 27, CBC: WBC 22.7 hemoglobin 14.4 hematocrit 47.0 MCV 96.7 platelet 474.  UA glucose 500, Hb moderate,  ketones 80 leukocyte Estrace negative,  nitrites negative .  Beta hydroxybutyric acid more than 8. chest x-ray no acute abnormality  Review of Systems:  Review of Systems  Constitutional:  Positive for malaise/fatigue.  HENT: Negative.    Eyes: Negative.   Respiratory: Negative.    Cardiovascular: Negative.   Gastrointestinal:  Positive for abdominal pain, nausea and vomiting.  Genitourinary:  Negative.   Musculoskeletal:  Positive for back pain.  Skin: Negative.   Neurological:  Positive for headaches.  Endo/Heme/Allergies: Negative.   Psychiatric/Behavioral:  Positive for depression. The patient is nervous/anxious and has insomnia.    Past Medical History:  Diagnosis Date   Adopted    Anxiety    Cluster headache syndrome, intractable    Depression    Depression    Migraine headache    Migraine without aura, with intractable migraine, so stated, without mention of status migrainosus 12/05/2012   Ovarian cyst    Sepsis ([place])    STD (sexually transmitted disease)    HSV II    Type I diabetes mellitus ([place])     Past Surgical History:  Procedure Laterality Date   APPENDECTOMY     BUNIONECTOMY Bilateral    CHOLECYSTECTOMY     EYE SURGERY     OVARIAN CYST REMOVAL       reports that she has never smoked. She has never used smokeless tobacco. She reports current alcohol use. She reports that she does not use drugs.  Allergies  Allergen Reactions   Sulfa Antibiotics Nausea And Vomiting   Wellbutrin [Bupropion] Other (See Comments)    "Makes me suicidal."   Amoxicillin Rash    Has patient had a PCN reaction causing immediate rash, facial/tongue/throat swelling, SOB or lightheadedness with hypotension: Yes Has patient had a PCN reaction causing severe rash involving mucus membranes or skin necrosis: No Has patient had a PCN reaction that required hospitalization: No Has patient had a PCN reaction occurring within the last 10 years:  No If all of the above answers are "NO", then may proceed with Cephalosporin use.    Codeine Rash    Cannot take just codeine. Able to tolerate derivatives such as hydrocodone    Family History  Adopted: Yes  Problem Relation Age of Onset   Diabetes Father    Family history reviewed and not pertinent   Prior to Admission medications   Medication Sig Start Date End Date Taking? Authorizing Provider  amphetamine-dextroamphetamine  (ADDERALL) 20 MG tablet Take 20 mg by mouth 4 (four) times daily. 10/24/17   [provider]  Armodafinil 250 MG tablet Take 250 mg by mouth daily. 08/03/20   [provider]  benzonatate (TESSALON) 200 MG capsule Take 1 capsule (200 mg total) by mouth 2 (two) times daily as needed for cough. 09/30/20   Brand Males, MD  blood glucose meter kit and supplies KIT Dispense based on patient and insurance preference. Use up to four times daily as directed. (FOR ICD-9 250.00, 250.01). 07/23/18   Kayleen Memos, DO  calcium carbonate (TUMS - DOSED IN MG ELEMENTAL CALCIUM) 500 MG chewable tablet Chew 1 tablet by mouth daily as needed for indigestion or heartburn.    [provider]  clonazePAM (KLONOPIN) 1 MG tablet Take 1 mg by mouth 4 (four) times daily. For anxiety 01/09/13   Niel Hummer, NP  cyclobenzaprine (FLEXERIL) 5 MG tablet Take 1 tablet (5 mg total) by mouth 3 (three) times daily as needed for muscle spasms. 09/15/20   Salvadore Dom, MD  dexamethasone (DECADRON) 2 MG tablet Take 3 tablets the first day, 2 the second and 1 the third day 07/26/20   Suzzanne Cloud, NP  DULoxetine (CYMBALTA) 60 MG capsule Take 60 mg by mouth daily.    [provider]  gabapentin (NEURONTIN) 300 MG capsule Take 300 mg by mouth 4 (four) times daily. 08/27/18   [provider]  Galcanezumab-gnlm (EMGALITY) 120 MG/ML SOAJ Inject into skin every 30 days. (90 day supply) 11/16/20   Suzzanne Cloud, NP  ibuprofen (ADVIL) 800 MG tablet Take 1 tablet (800 mg total) by mouth every 8 (eight) hours as needed. 09/15/20   Salvadore Dom, MD  Insulin Aspart, w/Niacinamide, (FIASP FLEXTOUCH) 100 UNIT/ML SOPN Inject 1-8 Units into the skin 3 (three) times daily. Patient taking differently: Inject 1-12 Units into the skin 3 (three) times daily. Per sliding scale, 1 unit for 8 grams of carbs 09/19/18   Eugenie Filler, MD  insulin degludec (TRESIBA) 100 UNIT/ML SOPN FlexTouch Pen  Inject 18 Units into the skin at bedtime. 09/19/18   [provider]  Insulin Pen Needle 31G X 5 MM MISC 1-8 Units by Does not apply route 3 (three) times daily. 09/19/18   Eugenie Filler, MD  levonorgestrel (MIRENA) 20 MCG/24HR IUD 1 each by Intrauterine route once. Placed 06/2014    [provider]  Multiple Vitamins-Minerals (MULTIVITAMIN GUMMIES WOMENS) CHEW Chew 1 each by mouth daily.    [provider]  ondansetron (ZOFRAN) 4 MG tablet Take 1 tablet (4 mg total) by mouth every 8 (eight) hours as needed for nausea or vomiting. 07/26/20   Suzzanne Cloud, NP  pantoprazole (PROTONIX) 40 MG tablet Take 1 tablet (40 mg total) by mouth 2 (two) times daily before a meal. 09/26/20   Spero Geralds, MD  Rimegepant Sulfate (NURTEC) 75 MG TBDP Take 75 mg by mouth as needed (take 1 at onset of headache, max is  1 tablet in 24 hours). 07/26/20   Suzzanne Cloud, NP  solifenacin (VESICARE) 10 MG tablet Take 10 mg by mouth daily. 08/09/20   [provider]  Vitamin D, Ergocalciferol, (DRISDOL) 1.25 MG (50000 UNIT) CAPS capsule Take 50,000 Units by mouth every 7 (seven) days. 08/10/20   [provider]  zolpidem (AMBIEN CR) 12.5 MG CR tablet Take 12.5 mg by mouth at bedtime.    [provider]  dicyclomine (BENTYL) 20 MG tablet Take 1 tablet (20 mg total) by mouth 2 (two) times daily. Patient not taking: Reported on 12/20/2018 04/01/18 12/20/18  Drenda Freeze, MD  insulin aspart (NOVOLOG) 100 UNIT/ML injection Inject 0-9 Units into the skin 3 (three) times daily with meals. Patient not taking: Reported on 12/20/2018 09/19/18 12/20/18  Eugenie Filler, MD  Insulin Glargine [facility]) 100 UNIT/ML SOPN Inject 0.22 mLs (22 Units total) into the skin daily. Patient not taking: Reported on 12/20/2018 09/19/18 12/20/18  Eugenie Filler, MD  misoprostol (CYTOTEC) 200 MCG tablet Place 2 tablets vaginally 6-12 hours prior to IUD insertion Patient not taking:  Reported on 12/20/2018 08/25/18 12/20/18  Salvadore Dom, MD    Physical Exam: Vitals:   12/09/20 1445 12/09/20 1500 12/09/20 1515 12/09/20 1530  BP:    (!) 113/56  Pulse: (!) 116 (!) 117 (!) 120 (!) 123  Resp: _0 Temp:      TempSrc:      SpO2: 98% 100% 96% 100%  Weight:      Height:        Constitutional: NAD, calm, comfortable Vitals:   12/09/20 1445 12/09/20 1500 12/09/20 1515 12/09/20 1530  BP:    (!) 113/56  Pulse: (!) 116 (!) 117 (!) 120 (!) 123  Resp: _1 Temp:      TempSrc:      SpO2: 98% 100% 96% 100%  Weight:      Height:       Eyes: PERRL, lids and conjunctivae normal ENMT: Mucous membranes are dry, Posterior pharynx clear of any exudate or lesions.Normal dentition.  Neck: normal, supple, no masses, no thyromegaly Respiratory: clear to auscultation bilaterally, no wheezing, no crackles. Normal respiratory effort. No accessory muscle use.  Cardiovascular: Regular rate and rhythm, no murmurs / rubs / gallops. No extremity edema. 2+ pedal pulses. No carotid bruits.  Abdomen: no tenderness, no masses palpated. No hepatosplenomegaly. Bowel sounds positive.  Musculoskeletal: no clubbing / cyanosis. No joint deformity upper and lower extremities. Good ROM, no contractures. Normal muscle tone.  Skin: no rashes, lesions, ulcers. No induration Neurologic: CN 2-12 grossly intact. Sensation intact, DTR normal. Strength 5/5 in all 4.  Psychiatric: Normal judgment and insight. Alert and oriented x 3. Normal mood.     Labs on Admission: I have personally reviewed following labs and imaging studies  CBC: Recent Labs  Lab 12/09/20 1413  WBC 22.7*  HGB 14.4  HCT 47.0*  MCV 96.7  PLT 562*   Basic Metabolic Panel: Recent Labs  Lab 12/09/20 1413  NA 134*  K 5.7*  CL 98  CO2 9*  GLUCOSE 581*  BUN 34*  CREATININE 1.65*  CALCIUM 9.7   GFR: Estimated Creatinine Clearance: 39.9 mL/min (A) (by C-G formula based on SCr of 1.65 mg/dL (H)). Liver  Function Tests: No results for input(s): AST, ALT, ALKPHOS, BILITOT, PROT, ALBUMIN in the last 168 hours. No results for input(s): LIPASE, AMYLASE in the last 168 hours. No results  for input(s): AMMONIA in the last 168 hours. Coagulation Profile: No results for input(s): INR, PROTIME in the last 168 hours. Cardiac Enzymes: No results for input(s): CKTOTAL, CKMB, CKMBINDEX, TROPONINI in the last 168 hours. BNP (last 3 results) No results for input(s): PROBNP in the last 8760 hours. HbA1C: No results for input(s): HGBA1C in the last 72 hours. CBG: Recent Labs  Lab 12/09/20 1021 12/09/20 1343 12/09/20 1448  GLUCAP 420* 464* 567*   Lipid Profile: No results for input(s): CHOL, HDL, LDLCALC, TRIG, CHOLHDL, LDLDIRECT in the last 72 hours. Thyroid Function Tests: No results for input(s): TSH, T4TOTAL, FREET4, T3FREE, THYROIDAB in the last 72 hours. Anemia Panel: No results for input(s): VITAMINB12, FOLATE, FERRITIN, TIBC, IRON, RETICCTPCT in the last 72 hours. Urine analysis:    Component Value Date/Time   COLORURINE STRAW (A) 12/09/2020 1449   APPEARANCEUR CLEAR 12/09/2020 1449   APPEARANCEUR Hazy 11/24/2013 1449   LABSPEC 1.017 12/09/2020 1449   LABSPEC 1.003 11/24/2013 1449   PHURINE 5.0 12/09/2020 1449   GLUCOSEU >=500 (A) 12/09/2020 1449   GLUCOSEU >=500 11/24/2013 1449   HGBUR MODERATE (A) 12/09/2020 1449   BILIRUBINUR NEGATIVE 12/09/2020 1449   BILIRUBINUR negative 08/28/2018 1608   BILIRUBINUR Negative 11/24/2013 1449   KETONESUR 80 (A) 12/09/2020 1449   PROTEINUR NEGATIVE 12/09/2020 1449   UROBILINOGEN 0.2 08/28/2018 1608   UROBILINOGEN 0.2 02/14/2015 1557   NITRITE NEGATIVE 12/09/2020 1449   LEUKOCYTESUR NEGATIVE 12/09/2020 1449   LEUKOCYTESUR 1+ 11/24/2013 1449    Radiological Exams on Admission: DG Chest 2 View  Result Date: 12/09/2020 CLINICAL DATA:  Chest pain and shortness of breath. Symptoms began 0 500 today. EXAM: CHEST - 2 VIEW COMPARISON:  03/14/2019  FINDINGS: Heart size is normal. Mediastinal shadows are normal. The lungs are clear. No bronchial thickening. No infiltrate, mass, effusion or collapse. Pulmonary vascularity is normal. No bony abnormality. IMPRESSION: Normal chest Electronically Signed   By: Nelson Chimes M.D.   On: 12/09/2020 11:47    EKG: Independently reviewed.  Sinus tachycardia, no ST-T wave changes.  Assessment/Plan Active Problems:   DKA, type 1 (HCC)   High anion gap metabolic acidosis secondary to DKA; Patient presented with blood glucose 580, UA: ketones+, BHBA >8, bicarb 9, anion gap 27. Patient reports having problem managing insulin pump. She has developed nausea, vomiting and abdominal pain since morning. She denies any urinary symptoms or any recent infections. Admitted in stepdown. Started on insulin drip as per DKA protocol. Continue to monitor BMPs every 4-6 hours. Continue to monitor BHBA, BMP and fingersticks. Obtain Hemoglobin A1c Continue IV hydration with Ringer's lactate.  Hyperkalemia: Patient presented with potassium 5.7 that could be secondary to DKA. Continue insulin drip, monitor serum potassium.  Leukocytosis : This could be reactive, monitor WBC trend Chest x-ray no infiltrate.  Tachycardia : Her heart rate remains between 110-120. Start low-dose metoprolol 12.5 every 12 hr   Diabetes mellitus : Obtain diabetic counselor consult. Being managed for DKA.   Acute kidney injury:  This could be prerenal due to DKA, and nausea and vomiting. Baseline serum creatinine 0.66, avoid nephrotoxic medications. Continue IV hydration, recheck a.m. labs.   Major depression /anxiety/ADHD: Resume home medications when medication reconciliation completed.   DVT prophylaxis: Lovenox Code Status: Full code Family Communication: No family at bed side. Disposition Plan:   Status is: Inpatient  Remains inpatient appropriate because:Inpatient level of care appropriate due to severity of  illness  Dispo: The patient is from: Home  Anticipated d/c is to: Home              Patient currently is not medically stable to d/c.   Difficult to place patient No   Consults called: None Admission status: Inpatient    Shawna Clamp MD [facility]   If 7PM-7AM, please contact night-coverage [email or web address]   12/09/2020, 3:57 PM

## 2020-12-09 NOTE — ED Notes (Signed)
Pt. Aware urine sample needed 

## 2020-12-09 NOTE — ED Notes (Signed)
CBG: 464 m g/dL

## 2020-12-09 NOTE — ED Notes (Signed)
CRITICAL VALUE STICKER  CRITICAL VALUE: glucose 581  RECEIVER (on-site recipient of call):  DATE & TIME NOTIFIED: 12/09/2020 1509  MESSENGER (representative from lab):  MD NOTIFIED: Susy Frizzle   TIME OF NOTIFICATION: 1510  RESPONSE:

## 2020-12-09 NOTE — ED Notes (Signed)
Patient assisted to restroom. Incontinent of urine and requesting to change clothes. Gown, sheet, towel, and socks provided to patient.

## 2020-12-09 NOTE — ED Notes (Signed)
CRITICAL VALUE STICKER  CRITICAL VALUE: pH 7.025  RECEIVER (on-site recipient of call):  DATE & TIME NOTIFIED: 1442  MESSENGER (representative from lab):   MD NOTIFIED: Susy Frizzle MD   TIME OF NOTIFICATION: 1442  RESPONSE: High

## 2020-12-09 NOTE — ED Provider Notes (Signed)
[facility] DEPART[facility] Provider Note  CSN: [id number] Arrival date & time: 12/09/20 0762    History Chief Complaint  Patient presents with   Hyperglycemia     Hyperglycemia  [name] is a 45 y.o. female with history of DM on insulin pump reports she has had vomiting, SOB and elevated glucose since last night, consistent with prior episodes of DKA. Some associated chest soreness. No fever.    Past Medical History:  Diagnosis Date   Adopted    Anxiety    Cluster headache syndrome, intractable    Depression    Depression    Migraine headache    Migraine without aura, with intractable migraine, so stated, without mention of status migrainosus 12/05/2012   Ovarian cyst    Sepsis ([employer])    STD (sexually transmitted disease)    HSV II    Type I diabetes mellitus ([place])     Past Surgical History:  Procedure Laterality Date   APPENDECTOMY     BUNIONECTOMY Bilateral    CHOLECYSTECTOMY     EYE SURGERY     OVARIAN CYST REMOVAL      Family History  Adopted: Yes  Problem Relation Age of Onset   Diabetes Father     Social History   Tobacco Use   Smoking status: Never   Smokeless tobacco: Never  Vaping Use   Vaping Use: Never used  Substance Use Topics   Alcohol use: Yes    Comment: seldom   Drug use: No     Home Medications Prior to Admission medications   Medication Sig Start Date End Date Taking? Authorizing Provider  amphetamine-dextroamphetamine (ADDERALL) 20 MG tablet Take 20 mg by mouth 4 (four) times daily. 10/24/17   [provider]  Armodafinil 250 MG tablet Take 250 mg by mouth daily. 08/03/20   [provider]  benzonatate (TESSALON) 200 MG capsule Take 1 capsule (200 mg total) by mouth 2 (two) times daily as needed for cough. 09/30/20   Brand Males, MD  blood glucose meter kit and supplies KIT Dispense based on patient and insurance preference. Use up to four times daily as directed. (FOR ICD-9 250.00, 250.01).  07/23/18   Kayleen Memos, DO  calcium carbonate (TUMS - DOSED IN MG ELEMENTAL CALCIUM) 500 MG chewable tablet Chew 1 tablet by mouth daily as needed for indigestion or heartburn.    [provider]  clonazePAM (KLONOPIN) 1 MG tablet Take 1 mg by mouth 4 (four) times daily. For anxiety 01/09/13   Niel Hummer, NP  cyclobenzaprine (FLEXERIL) 5 MG tablet Take 1 tablet (5 mg total) by mouth 3 (three) times daily as needed for muscle spasms. 09/15/20   Salvadore Dom, MD  dexamethasone (DECADRON) 2 MG tablet Take 3 tablets the first day, 2 the second and 1 the third day 07/26/20   Suzzanne Cloud, NP  DULoxetine (CYMBALTA) 60 MG capsule Take 60 mg by mouth daily.    [provider]  gabapentin (NEURONTIN) 300 MG capsule Take 300 mg by mouth 4 (four) times daily. 08/27/18   [provider]  Galcanezumab-gnlm (EMGALITY) 120 MG/ML SOAJ Inject into skin every 30 days. (90 day supply) 11/16/20   Suzzanne Cloud, NP  ibuprofen (ADVIL) 800 MG tablet Take 1 tablet (800 mg total) by mouth every 8 (eight) hours as needed. 09/15/20   Salvadore Dom, MD  Insulin Aspart, w/Niacinamide, (FIASP FLEXTOUCH) 100 UNIT/ML SOPN Inject 1-8 Units into the skin 3 (  three) times daily. Patient taking differently: Inject 1-12 Units into the skin 3 (three) times daily. Per sliding scale, 1 unit for 8 grams of carbs 09/19/18   Eugenie Filler, MD  insulin degludec (TRESIBA) 100 UNIT/ML SOPN FlexTouch Pen Inject 18 Units into the skin at bedtime. 09/19/18   [provider]  Insulin Pen Needle 31G X 5 MM MISC 1-8 Units by Does not apply route 3 (three) times daily. 09/19/18   Eugenie Filler, MD  levonorgestrel (MIRENA) 20 MCG/24HR IUD 1 each by Intrauterine route once. Placed 06/2014    [provider]  Multiple Vitamins-Minerals (MULTIVITAMIN GUMMIES WOMENS) CHEW Chew 1 each by mouth daily.    [provider]  ondansetron (ZOFRAN) 4 MG tablet Take 1 tablet (4 mg total) by  mouth every 8 (eight) hours as needed for nausea or vomiting. 07/26/20   Suzzanne Cloud, NP  pantoprazole (PROTONIX) 40 MG tablet Take 1 tablet (40 mg total) by mouth 2 (two) times daily before a meal. 09/26/20   Spero Geralds, MD  Rimegepant Sulfate (NURTEC) 75 MG TBDP Take 75 mg by mouth as needed (take 1 at onset of headache, max is 1 tablet in 24 hours). 07/26/20   Suzzanne Cloud, NP  solifenacin (VESICARE) 10 MG tablet Take 10 mg by mouth daily. 08/09/20   [provider]  Vitamin D, Ergocalciferol, (DRISDOL) 1.25 MG (50000 UNIT) CAPS capsule Take 50,000 Units by mouth every 7 (seven) days. 08/10/20   [provider]  zolpidem (AMBIEN CR) 12.5 MG CR tablet Take 12.5 mg by mouth at bedtime.    [provider]  dicyclomine (BENTYL) 20 MG tablet Take 1 tablet (20 mg total) by mouth 2 (two) times daily. Patient not taking: Reported on 12/20/2018 04/01/18 12/20/18  Drenda Freeze, MD  insulin aspart (NOVOLOG) 100 UNIT/ML injection Inject 0-9 Units into the skin 3 (three) times daily with meals. Patient not taking: Reported on 12/20/2018 09/19/18 12/20/18  Eugenie Filler, MD  Insulin Glargine [facility]) 100 UNIT/ML SOPN Inject 0.22 mLs (22 Units total) into the skin daily. Patient not taking: Reported on 12/20/2018 09/19/18 12/20/18  Eugenie Filler, MD  misoprostol (CYTOTEC) 200 MCG tablet Place 2 tablets vaginally 6-12 hours prior to IUD insertion Patient not taking: Reported on 12/20/2018 08/25/18 12/20/18  Salvadore Dom, MD     Allergies    Sulfa antibiotics, Wellbutrin [bupropion], Amoxicillin, and Codeine   Review of Systems   Review of Systems A comprehensive review of systems was completed and negative except as noted in HPI.    Physical Exam BP 128/75 (BP Location: Right Arm)   Pulse (!) 112   Temp 97.8 F (36.6 C) (Oral)   Resp 18   Ht 5' 1"  (1.549 m)   Wt 78.5 kg   SpO2 99%   BMI 32.69 kg/m   Physical Exam Vitals and nursing note  reviewed.  Constitutional:      Appearance: Normal appearance.  HENT:     Head: Normocephalic and atraumatic.     Nose: Nose normal.     Mouth/Throat:     Mouth: Mucous membranes are moist.  Eyes:     Extraocular Movements: Extraocular movements intact.     Conjunctiva/sclera: Conjunctivae normal.  Cardiovascular:     Rate and Rhythm: Tachycardia present.  Pulmonary:     Effort: Pulmonary effort is normal.     Breath sounds: Normal breath sounds.  Abdominal:     General: Abdomen  is flat.     Palpations: Abdomen is soft.     Tenderness: There is no abdominal tenderness.  Musculoskeletal:        General: No swelling. Normal range of motion.     Cervical back: Neck supple.  Skin:    General: Skin is warm and dry.  Neurological:     General: No focal deficit present.     Mental Status: She is alert and oriented to person, place, and time.  Psychiatric:        Mood and Affect: Mood normal.     ED Results / Procedures / Treatments   Labs (all labs ordered are listed, but only abnormal results are displayed) Labs Reviewed  CBG MONITORING, ED - Abnormal; Notable for the following components:      Result Value   Glucose-Capillary 420 (*)    All other components within normal limits  BASIC METABOLIC PANEL  CBC  URINALYSIS, ROUTINE W REFLEX MICROSCOPIC  CBG MONITORING, ED  I-STAT BETA HCG BLOOD, ED ([facility], [facility], [facility] ONLY)  TROPONIN I (HIGH SENSITIVITY)    EKG None  Radiology No results found.  Procedures .Critical Care  Date/Time: 12/09/2020 3:18 PM Performed by: Truddie Hidden, MD Authorized by: Truddie Hidden, MD   Critical care provider statement:    Critical care time (minutes):  45   Critical care time was exclusive of:  Separately billable procedures and treating other patients   Critical care was necessary to treat or prevent imminent or life-threatening deterioration of the following conditions:  Endocrine crisis   Critical care was time spent  personally by me on the following activities:  Discussions with consultants, evaluation of patient's response to treatment, examination of patient, ordering and performing treatments and interventions, ordering and review of laboratory studies, ordering and review of radiographic studies, pulse oximetry, re-evaluation of patient's condition, obtaining history from patient or surrogate and review of old charts  Medications Ordered in the ED Medications  ondansetron (ZOFRAN-ODT) disintegrating tablet 4 mg (has no administration in time range)     MDM Rules/Calculators/A&P MDM  CBG is elevated, patient currently in triage, will need exam room for IV placement/blood draw to evaluate suspected DKA.  ED Course  I have reviewed the triage vital signs and the nursing notes.  Pertinent labs & imaging results that were available during my care of the patient were reviewed by me and considered in my medical decision making (see chart for details).  Clinical Course as of 12/09/20 1518  Fri Dec 09, 2020  1346 Patient now in a room, glucose remains elevated. Awaiting blood work to assess acid/base status.  [CS]  5056 CBC with leukocytosis. I-stat HCG is borderline positive but no concern for pregnancy, patient has IUD.  [CS]  9794 VBG with marked acidosis, consistent with suspected DKA. Will begin insulin drip.  [CS]  8016 Care of the patient signed out to Dr. Tyrone Nine at the change of shift.  [CS]    Clinical Course User Index [CS] Truddie Hidden, MD    Final Clinical Impression(s) / ED Diagnoses Final diagnoses:  None    Rx / DC Orders ED Discharge Orders     None        Truddie Hidden, MD 12/09/20 [phone]

## 2020-12-10 LAB — BASIC METABOLIC PANEL
Anion gap: 8 (ref 5–15)
Anion gap: 7 (ref 5–15)
BUN: 26 mg/dL — ABNORMAL HIGH (ref 6–20)
BUN: 24 mg/dL — ABNORMAL HIGH (ref 6–20)
CO2: 19 mmol/L — ABNORMAL LOW (ref 22–32)
CO2: 20 mmol/L — ABNORMAL LOW (ref 22–32)
Calcium: 8.8 mg/dL — ABNORMAL LOW (ref 8.9–10.3)
Calcium: 8.6 mg/dL — ABNORMAL LOW (ref 8.9–10.3)
Chloride: 112 mmol/L — ABNORMAL HIGH (ref 98–111)
Chloride: 111 mmol/L (ref 98–111)
Creatinine, Ser: 0.88 mg/dL (ref 0.44–1.00)
Creatinine, Ser: 0.94 mg/dL (ref 0.44–1.00)
GFR, Estimated: >60 mL/min (ref >60)
GFR, Estimated: >60 mL/min (ref >60)
Glucose, Bld: 226 mg/dL — ABNORMAL HIGH (ref 70–99)
Glucose, Bld: 180 mg/dL — ABNORMAL HIGH (ref 70–99)
Potassium: 3.7 mmol/L (ref 3.5–5.1)
Potassium: 3.9 mmol/L (ref 3.5–5.1)
Sodium: 139 mmol/L (ref 135–145)
Sodium: 138 mmol/L (ref 135–145)

## 2020-12-10 LAB — HEMOGLOBIN A1C
Hgb A1c MFr Bld: 8.5 % — ABNORMAL HIGH (ref 4.8–5.6)
Mean Plasma Glucose: 197 mg/dL

## 2020-12-10 LAB — CBC
HCT: 39 % (ref 36.0–46.0)
Hemoglobin: 12.3 g/dL (ref 12.0–15.0)
MCH: 29.5 pg (ref 26.0–34.0)
MCHC: 31.5 g/dL (ref 30.0–36.0)
MCV: 93.5 fL (ref 80.0–100.0)
Platelets: 387 10*3/uL (ref 150–400)
RBC: 4.17 MIL/uL (ref 3.87–5.11)
RDW: 12.8 % (ref 11.5–15.5)
WBC: 15.4 10*3/uL — ABNORMAL HIGH (ref 4.0–10.5)
nRBC: 0 % (ref 0.0–0.2)

## 2020-12-10 LAB — BETA-HYDROXYBUTYRIC ACID: Beta-Hydroxybutyric Acid: 1.18 mmol/L — ABNORMAL HIGH (ref 0.05–0.27)

## 2020-12-10 LAB — GLUCOSE, CAPILLARY
Glucose-Capillary: 143 mg/dL — ABNORMAL HIGH (ref 70–99)
Glucose-Capillary: 173 mg/dL — ABNORMAL HIGH (ref 70–99)
Glucose-Capillary: 177 mg/dL — ABNORMAL HIGH (ref 70–99)
Glucose-Capillary: 189 mg/dL — ABNORMAL HIGH (ref 70–99)
Glucose-Capillary: 214 mg/dL — ABNORMAL HIGH (ref 70–99)
Glucose-Capillary: 218 mg/dL — ABNORMAL HIGH (ref 70–99)
Glucose-Capillary: 231 mg/dL — ABNORMAL HIGH (ref 70–99)
Glucose-Capillary: 234 mg/dL — ABNORMAL HIGH (ref 70–99)
Glucose-Capillary: 243 mg/dL — ABNORMAL HIGH (ref 70–99)
Glucose-Capillary: 248 mg/dL — ABNORMAL HIGH (ref 70–99)
Glucose-Capillary: 248 mg/dL — ABNORMAL HIGH (ref 70–99)
Glucose-Capillary: 253 mg/dL — ABNORMAL HIGH (ref 70–99)

## 2020-12-10 LAB — PREGNANCY, URINE: Preg Test, Ur: NEGATIVE

## 2020-12-10 MED ORDER — INSULIN ASPART 100 UNIT/ML IJ SOLN: 0.0000 [IU] | Freq: Three times a day (TID) | INTRAMUSCULAR | Status: DC

## 2020-12-10 MED ORDER — INSULIN GLARGINE 100 UNIT/ML ~~LOC~~ SOLN
10.0000 [IU] | Freq: Every day | SUBCUTANEOUS | Status: DC
  Administered 2020-12-10: 10 [IU] via SUBCUTANEOUS
  Filled 2020-12-10: qty 0.1

## 2020-12-10 MED ORDER — PHENOL 1.4 % MT LIQD
1.0000 | OROMUCOSAL | Status: DC | PRN
  Filled 2020-12-10: qty 177

## 2020-12-10 MED ORDER — AMPHETAMINE-DEXTROAMPHETAMINE 20 MG PO TABS
20.0000 mg | ORAL_TABLET | Freq: Four times a day (QID) | ORAL | Status: DC
  Administered 2020-12-11: 20 mg via ORAL
  Filled 2020-12-10 (×2): qty 1

## 2020-12-10 MED ORDER — INSULIN ASPART 100 UNIT/ML IJ SOLN
0.0000 [IU] | Freq: Every day | INTRAMUSCULAR | Status: DC
  Administered 2020-12-10: 2 [IU] via SUBCUTANEOUS

## 2020-12-10 MED ORDER — RIMEGEPANT SULFATE 75 MG PO TBDP: 75.0000 mg | ORAL_TABLET | Freq: Every day | ORAL | Status: DC | PRN

## 2020-12-10 MED ORDER — INSULIN ASPART 100 UNIT/ML IJ SOLN
0.0000 [IU] | Freq: Three times a day (TID) | INTRAMUSCULAR | Status: DC
  Administered 2020-12-10: 3 [IU] via SUBCUTANEOUS
  Administered 2020-12-10 – 2020-12-11 (×2): 2 [IU] via SUBCUTANEOUS

## 2020-12-10 MED ORDER — ZOLPIDEM TARTRATE 5 MG PO TABS
5.0000 mg | ORAL_TABLET | Freq: Every evening | ORAL | Status: DC | PRN
  Administered 2020-12-10: 5 mg via ORAL
  Filled 2020-12-10: qty 1

## 2020-12-10 MED ORDER — INSULIN ASPART 100 UNIT/ML IJ SOLN
4.0000 [IU] | Freq: Three times a day (TID) | INTRAMUSCULAR | Status: DC
  Administered 2020-12-10 – 2020-12-11 (×2): 4 [IU] via SUBCUTANEOUS

## 2020-12-10 MED ORDER — ALUM & MAG HYDROXIDE-SIMETH 200-200-20 MG/5ML PO SUSP
30.0000 mL | Freq: Four times a day (QID) | ORAL | Status: DC | PRN
  Administered 2020-12-10: 30 mL via ORAL
  Filled 2020-12-10: qty 30

## 2020-12-10 MED ORDER — SODIUM CHLORIDE 0.9 % IV SOLN: INTRAVENOUS | Status: DC

## 2020-12-10 MED ORDER — CLONAZEPAM 1 MG PO TABS
1.0000 mg | ORAL_TABLET | Freq: Three times a day (TID) | ORAL | Status: DC
Start: 2020-12-10 — End: 2020-12-11
  Administered 2020-12-10 – 2020-12-11 (×2): 1 mg via ORAL
  Filled 2020-12-10 (×2): qty 1

## 2020-12-10 MED ORDER — GABAPENTIN 300 MG PO CAPS
300.0000 mg | ORAL_CAPSULE | Freq: Four times a day (QID) | ORAL | Status: DC
  Administered 2020-12-10 – 2020-12-11 (×3): 300 mg via ORAL
  Filled 2020-12-10 (×3): qty 1

## 2020-12-10 MED ORDER — INSULIN GLARGINE 100 UNIT/ML ~~LOC~~ SOLN
10.0000 [IU] | Freq: Two times a day (BID) | SUBCUTANEOUS | Status: DC
  Administered 2020-12-10 – 2020-12-11 (×2): 10 [IU] via SUBCUTANEOUS
  Filled 2020-12-10 (×3): qty 0.1

## 2020-12-10 MED ORDER — ORAL CARE MOUTH RINSE
15.0000 mL | Freq: Two times a day (BID) | OROMUCOSAL | Status: DC
  Administered 2020-12-10 – 2020-12-11 (×3): 15 mL via OROMUCOSAL

## 2020-12-10 MED ORDER — LIDOCAINE VISCOUS HCL 2 % MT SOLN
15.0000 mL | Freq: Once | OROMUCOSAL | Status: AC
  Administered 2020-12-10: 15 mL via OROMUCOSAL
  Filled 2020-12-10: qty 15

## 2020-12-10 MED ORDER — CLONAZEPAM 1 MG PO TABS: 1.0000 mg | ORAL_TABLET | Freq: Four times a day (QID) | ORAL | Status: DC

## 2020-12-10 MED ORDER — ATORVASTATIN CALCIUM 10 MG PO TABS
10.0000 mg | ORAL_TABLET | Freq: Every day | ORAL | Status: DC
  Administered 2020-12-10 – 2020-12-11 (×2): 10 mg via ORAL
  Filled 2020-12-10 (×2): qty 1

## 2020-12-10 NOTE — Progress Notes (Signed)
Inpatient Diabetes Program Recommendations  AACE/ADA: New Consensus Statement on Inpatient Glycemic Control   Target Ranges:  Prepandial:   less than 140 mg/dL      Peak postprandial:   less than 180 mg/dL (1-2 hours)      Critically ill patients:  140 - 180 mg/dL   Results for MAKENZYE, TROUTMAN (MRN [id number]) as of 12/10/2020 11:36  Ref. Range 12/10/2020 00:24 12/10/2020 01:47 12/10/2020 03:23 12/10/2020 04:16 12/10/2020 05:13 12/10/2020 06:26 12/10/2020 07:32 12/10/2020 08:33  Glucose-Capillary Latest Ref Range: 70 - 99 mg/dL 347 (H) 425 (H) 956 (H) 234 (H) 218 (H) 189 (H) 173 (H) 143 (H)   Results for MYSTIQUE, BJELLAND (MRN [id number]) as of 12/10/2020 11:36  Ref. Range 12/09/2020 14:13  Beta-Hydroxybutyric Acid Latest Ref Range: 0.05 - 0.27 mmol/L >8.00 (H)  Glucose Latest Ref Range: 70 - 99 mg/dL 951 (HH)  Hemoglobin O8C Latest Ref Range: 4.8 - 5.6 % 8.5 (H)   Review of Glycemic Control  Diabetes history: DM1 Outpatient Diabetes medications: Medtronic insulin pump with Fiasp (has been using for about 2 months); prior to that she was taking [place] 24 units QHS, Fiasp 1 unit for 8 grams of carbs, Symlin 60 mg TID Current orders for Inpatient glycemic control: Lantus 10 units daily, Novolog 0-15 units TID with meals  Inpatient Diabetes Program Recommendations:    Insulin: Please consider increasing Lantus to 10 units BID, decreasing Novolog correction to 0-9 units TID with meals, adding Novolog 0-5 units QHS, and ordering Novolog 4 units TID with meals for meal coverage if patient eats at least 50% of meals.  NOTE: Noted consult for Diabetes Coordinator. Diabetes Coordinator is not on campus over the weekend but available by pager from 8am to 5pm for questions or concerns. Chart reviewed. Patient admitted with DKA and was ordered IV insulin. Patient given Lantus 10 units today at 10:42 am. Noted patient has no insurance listed and H&P notes patient was using an insulin pump. Patient sees Dr. Shawnee Knapp  (Endocrinologist) and was last seen on 09/26/20. Spoke with patient over the phone regarding DM control. Patient confirms that she is seeing Dr. Shawnee Knapp for DM management. She notes that she recently has been having issues with hypoglycemia and notes she had 2 severe lows in which she experienced seizures. Therefore, she decided to give the insulin pump another try. Patient reports that she started using the Medtronic insulin pump about 2 months ago. She states that she does not recall her exact basal rates but notes that her total basal per day is 23 or 24 units, insulin to carb ratio is 1:10 grams and sensitivity is 1:40 mg/dl.  She notes that prior to this DKA episodes her average glucose had been running around 150 mg/dl. Patient reports that she removed her insulin pump infusion site yesterday and she states that it was not kinked so she is unsure about why she went into DKA but notes it could be from food poisoning (she ate pork prior to getting sick). Patient states that she does not want to go back on her insulin pump, she prefer to use [place] and [place] for DM control. Patient states she has about 3 Tresiba pens at home and she plenty of Saks as well. Patient states she is planning to sign up for patient medication assistance so she can continue on [place]. Patient reports that currently she is uninsured. Patient reports she has an appointment with Dr. Shawnee Knapp on 12/27/20 for follow up. Patient reports  that her last A1C was 8.2%. Discussed current A1C of 8.5% on 12/09/20. Patient reports she is no longer using a CGM because it was not accurate so she prefer to continue with finger sticks for glucose monitoring. Discussed current insulin orders and informed patient that it would be requested that basal insulin be increased, meal coverage added, and correction scale decreased. Encouraged patient to follow up with Dr. Shawnee Knapp as scheduled, to apply for patient medication assistance as soon as possible, and take  insulin as prescribed.Patient verbalized understanding of information discussed and states she has no questions at this time.  Thanks, Orlando Penner, RN, MSN, CDE Diabetes Coordinator Inpatient Diabetes Program [phone] (Team Pager from 8am to 5pm)

## 2020-12-10 NOTE — Progress Notes (Addendum)
PROGRESS NOTE    [name]  NAT:[id number] DOB: 1976-03-22 DOA: 12/09/2020 PCP: Jordan Hawks, PA-C   Brief Narrative:  This 45 years old female with PMH significant for type 1 diabetes on insulin pump, major depression, chronic anxiety disorder, neuropathic pain, ADHD, recurrent hospitalizations for DKA presented in the ED with one day history of nausea,vomiting and abdominal pain since waking up in the morning. Patient reports having problem managing insulin pump. She is found to have diabetic ketoacidosis (blood glucose 580, UA: ketones+, BHBA >8, bicarb 9, anion gap 27).  Patient is started on insulin drip as per DKA protocol.  Anion gap is closed.  she still reports nausea.  Started on Lantus and sliding scale.   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  Patient presented with blood glucose 580, UA: ketones+, BHBA >8, bicarb 9, anion gap 27. Patient reports having problem managing insulin pump. She has developed nausea, vomiting and abdominal pain since morning. She denies any urinary symptoms or any recent infections. Admitted in stepdown. Started on insulin drip as per DKA protocol. Continue to monitor BMPs every 4-6 hours. Continue to monitor BHBA, BMP and fingersticks. Obtain Hemoglobin A1c Anion gap closed, insulin drip discontinued. started on Lantus and regular insulin sliding scale. Start carb modified diet. Dietitian consult.   Hyperkalemia: > Resolved Patient presented with potassium 5.7 that could be secondary to DKA. Insulin drip discontinued., monitor serum potassium.   Leukocytosis : > Improving This  could be reactive, monitor WBC trend Chest x-ray no infiltrate.   Tachycardia : Improved. Her heart rate remains between 110-120. Continue low-dose metoprolol 12.5mg  every 12 hr.   Diabetes mellitus : Obtain diabetic counselor consult. Continue Lantus and sliding scale.   Acute kidney injury:  > Resolved. This could be prerenal due to DKA, and nausea and vomiting. Baseline serum creatinine 0.66, avoid nephrotoxic medications. Continue IV hydration, recheck a.m. labs.     Major depression /anxiety/ADHD: Resume home medications    DVT prophylaxis:  Lovenox Code Status:  Full code. Family Communication:  No family at bed side. Disposition Plan:   Status is: Inpatient  Remains inpatient appropriate because:Inpatient level of care appropriate due to severity of illness  Dispo: The patient is from: Home              Anticipated d/c is to: Home              Patient currently is not medically stable to d/c.   Difficult to place patient No   Consultants:  None  Procedures: None Antimicrobials:   Anti-infectives (From admission, onward)    None       Subjective: Patient was seen and examined at bedside.  Overnight events noted.   Patient reports feeling improved , still feeling nauseous but denies any pain.  Objective: Vitals:   12/10/20 0400 12/10/20 0500 12/10/20 0600 12/10/20 0900  BP: (!) 121/57 (!) 111/41 (!) 113/52 (!) 118/51  Pulse: 91 92 87 95  Resp: 19 18 18 20   Temp:    98.4 F (36.9 C)  TempSrc:    Oral  SpO2: 97% 98% 97%  96%  Weight:      Height:        Intake/Output Summary (Last 24 hours) at 12/10/2020 1206 Last data filed at 12/09/2020 1850 Gross per 24 hour  Intake 523.68 ml  Output --  Net 523.68 ml   Filed Weights   12/09/20 1107 12/09/20 1345  Weight: 78.5 kg 73.5 kg    Examination:  General exam: Appears calm and comfortable, not in any acute distress. Respiratory system: Clear to auscultation. Respiratory effort  normal. Cardiovascular system: S1 & S2 heard, RRR. No JVD, murmurs, rubs, gallops or clicks. No pedal edema. Gastrointestinal system: Abdomen is nondistended, soft and nontender. No organomegaly or masses felt.  Normal bowel sounds heard. Central nervous system: Alert and oriented. No focal neurological deficits. Extremities: Symmetric 5 x 5 power. No edema, no cyanosis, No clubbing. Skin: No rashes, lesions or ulcers Psychiatry: Judgement and insight appear normal. Mood & affect appropriate.     Data Reviewed: I have personally reviewed following labs and imaging studies  CBC: Recent Labs  Lab 12/09/20 1413 12/10/20 0440  WBC 22.7* 15.4*  HGB 14.4 12.3  HCT 47.0* 39.0  MCV 96.7 93.5  PLT 474* 387   Basic Metabolic Panel: Recent Labs  Lab 12/09/20 1548 12/09/20 1852 12/09/20 2254 12/10/20 0440 12/10/20 0751  NA 134* 141 139 139 138  K 4.8 4.4 5.2* 3.7 3.9  CL 104 109 112* 112* 111  CO2 7* 11* 12* 19* 20*  GLUCOSE 394* 354* 233* 226* 180*  BUN 25* 34* 29* 26* 24*  CREATININE 1.05* 1.39* 1.23* 0.88 0.94  CALCIUM 8.1* 9.2 9.3 8.8* 8.6*   GFR: Estimated Creatinine Clearance: 70 mL/min (by C-G formula based on SCr of 0.94 mg/dL). Liver Function Tests: No results for input(s): AST, ALT, ALKPHOS, BILITOT, PROT, ALBUMIN in the last 168 hours. No results for input(s): LIPASE, AMYLASE in the last 168 hours. No results for input(s): AMMONIA in the last 168 hours. Coagulation Profile: No results for input(s): INR, PROTIME in the last 168 hours. Cardiac Enzymes: No results for input(s): CKTOTAL, CKMB, CKMBINDEX, TROPONINI in the last 168 hours. BNP (last 3 results) No results for input(s): PROBNP in the last 8760 hours. HbA1C: Recent Labs    12/09/20 1413  HGBA1C 8.5*   CBG: Recent Labs  Lab 12/10/20 0513 12/10/20 0626 12/10/20 0732 12/10/20 0833 12/10/20 1154  GLUCAP 218* 189* 173* 143* 177*   Lipid Profile: No results for input(s): CHOL, HDL, LDLCALC, TRIG,  CHOLHDL, LDLDIRECT in the last 72 hours. Thyroid Function Tests: No results for input(s): TSH, T4TOTAL, FREET4, T3FREE, THYROIDAB in the last 72 hours. Anemia Panel: No results for input(s): VITAMINB12, FOLATE, FERRITIN, TIBC, IRON, RETICCTPCT in the last 72 hours. Sepsis Labs: No results for input(s): PROCALCITON, LATICACIDVEN in the last 168 hours.  Recent Results (from the past 240 hour(s))  Resp Panel by RT-PCR (Flu A&B, Covid) Nasopharyngeal Swab     Status: None   Collection Time: 12/09/20  4:45 PM   Specimen: Nasopharyngeal Swab; Nasopharyngeal(NP) swabs in vial transport medium  Result Value Ref Range Status   SARS Coronavirus 2 by RT PCR NEGATIVE NEGATIVE Final    Comment: (NOTE) SARS-CoV-2 target nucleic acids are NOT DETECTED.  The SARS-CoV-2 RNA is generally detectable in upper respiratory specimens during the acute phase of infection. The lowest concentration of SARS-CoV-2 viral copies this assay can detect is 138 copies/mL. A negative result does not preclude SARS-Cov-2 infection and should not be used as the sole basis for  treatment or other patient management decisions. A negative result may occur with  improper specimen collection/handling, submission of specimen other than nasopharyngeal swab, presence of viral mutation(s) within the areas targeted by this assay, and inadequate number of viral copies(<138 copies/mL). A negative result must be combined with clinical observations, patient history, and epidemiological information. The expected result is Negative.  Fact Sheet for Patients:  BloggerCourse.com  Fact Sheet for Healthcare Providers:  SeriousBroker.it  This test is no t yet approved or cleared by the [place] FDA and  has been authorized for detection and/or diagnosis of SARS-CoV-2 by FDA under an Emergency Use Authorization (EUA). This EUA will remain  in effect (meaning this test can be used) for  the duration of the COVID-19 declaration under Section 564(b)(1) of the Act, 21 U.S.C.section 360bbb-3(b)(1), unless the authorization is terminated  or revoked sooner.       Influenza A by PCR NEGATIVE NEGATIVE Final   Influenza B by PCR NEGATIVE NEGATIVE Final    Comment: (NOTE) The Xpert Xpress SARS-CoV-2/FLU/RSV plus assay is intended as an aid in the diagnosis of influenza from Nasopharyngeal swab specimens and should not be used as a sole basis for treatment. Nasal washings and aspirates are unacceptable for Xpert Xpress SARS-CoV-2/FLU/RSV testing.  Fact Sheet for Patients: BloggerCourse.com  Fact Sheet for Healthcare Providers: SeriousBroker.it  This test is not yet approved or cleared by the [place] FDA and has been authorized for detection and/or diagnosis of SARS-CoV-2 by FDA under an Emergency Use Authorization (EUA). This EUA will remain in effect (meaning this test can be used) for the duration of the COVID-19 declaration under Section 564(b)(1) of the Act, 21 U.S.C. section 360bbb-3(b)(1), unless the authorization is terminated or revoked.  Performed at [facility], [address] [place], [place] [address]   MRSA PCR Screening     Status: None   Collection Time: 12/09/20  6:29 PM   Specimen: Nasal Mucosa; Nasopharyngeal  Result Value Ref Range Status   MRSA by PCR NEGATIVE NEGATIVE Final    Comment:        The GeneXpert MRSA Assay (FDA approved for NASAL specimens only), is one component of a comprehensive MRSA colonization surveillance program. It is not intended to diagnose MRSA infection nor to guide or monitor treatment for MRSA infections. Performed at [facility], [address] [place], [place] [address]     Radiology Studies: DG Chest 2 View  Result Date: 12/09/2020 CLINICAL DATA:  Chest pain and shortness of breath. Symptoms began 0 500 today.  EXAM: CHEST - 2 VIEW COMPARISON:  03/14/2019 FINDINGS: Heart size is normal. Mediastinal shadows are normal. The lungs are clear. No bronchial thickening. No infiltrate, mass, effusion or collapse. Pulmonary vascularity is normal. No bony abnormality. IMPRESSION: Normal chest Electronically Signed   By: Paulina Fusi M.D.   On: 12/09/2020 11:47     Scheduled Meds:  amphetamine-dextroamphetamine  20 mg Oral QID   atorvastatin  10 mg Oral Daily   Chlorhexidine Gluconate Cloth  6 each Topical Daily   clonazePAM  1 mg Oral QID   enoxaparin (LOVENOX) injection  40 mg Subcutaneous Q24H   gabapentin  300 mg Oral QID   insulin aspart  0-15 Units Subcutaneous TID WC   insulin glargine  10 Units Subcutaneous Daily   mouth rinse  15 mL Mouth Rinse BID   metoprolol tartrate  12.5 mg Oral BID   Continuous Infusions:  dextrose 5% lactated ringers 125  mL/hr at 12/09/20 2225   lactated ringers 125 mL/hr at 12/09/20 1451     LOS: 1 day    Time spent: 35 mins    Rye Dorado, MD [facility]   If 7PM-7AM, please contact night-coverage

## 2020-12-11 LAB — POTASSIUM: Potassium: 3.4 mmol/L — ABNORMAL LOW (ref 3.5–5.1)

## 2020-12-11 LAB — BASIC METABOLIC PANEL
Anion gap: 5 (ref 5–15)
BUN: 14 mg/dL (ref 6–20)
CO2: 18 mmol/L — ABNORMAL LOW (ref 22–32)
Calcium: 8 mg/dL — ABNORMAL LOW (ref 8.9–10.3)
Chloride: 113 mmol/L — ABNORMAL HIGH (ref 98–111)
Creatinine, Ser: 0.82 mg/dL (ref 0.44–1.00)
GFR, Estimated: >60 mL/min (ref >60)
Glucose, Bld: 245 mg/dL — ABNORMAL HIGH (ref 70–99)
Potassium: 5.2 mmol/L — ABNORMAL HIGH (ref 3.5–5.1)
Sodium: 136 mmol/L (ref 135–145)

## 2020-12-11 LAB — GLUCOSE, CAPILLARY
Glucose-Capillary: 175 mg/dL — ABNORMAL HIGH (ref 70–99)
Glucose-Capillary: 140 mg/dL — ABNORMAL HIGH (ref 70–99)

## 2020-12-11 LAB — GROUP A STREP BY PCR: Group A Strep by PCR: NOT DETECTED

## 2020-12-11 MED ORDER — POTASSIUM CHLORIDE 20 MEQ PO PACK: 40.0000 meq | PACK | Freq: Once | ORAL | Status: DC

## 2020-12-11 MED ORDER — PANTOPRAZOLE SODIUM 40 MG PO TBEC: 40.0000 mg | DELAYED_RELEASE_TABLET | Freq: Every day | ORAL | 1 refills | Status: DC

## 2020-12-11 MED ORDER — ZOLPIDEM TARTRATE 5 MG PO TABS
5.0000 mg | ORAL_TABLET | Freq: Once | ORAL | Status: DC
  Filled 2020-12-11: qty 1

## 2020-12-11 MED ORDER — LORAZEPAM 2 MG/ML IJ SOLN
1.0000 mg | Freq: Once | INTRAMUSCULAR | Status: DC
  Filled 2020-12-11: qty 1

## 2020-12-11 NOTE — Discharge Instructions (Signed)
Advised to follow-up with primary care physician in 1 week. Advised to follow-up with endocrinologist Dr. Illene Regulus on 12/12/2020. Advised to continue prior home insulin regimen.

## 2020-12-11 NOTE — Discharge Summary (Signed)
Physician Discharge Summary  [name]:[id number] DOB: [birth year] DOA: 12/09/2020  PCP: Mindi Curling, PA-C  Admit date: 12/09/2020  Discharge date: 12/11/2020  Admitted From: Home.  Disposition: Home.  Recommendations for Outpatient Follow-up:  Follow up with PCP in 1-2 weeks. Please obtain BMP/CBC in one week. Advised to follow-up with Endocrinologist Dr. Dion Body on 12/12/2020. Advised to continue prior home insulin regimen.  Home Health:None Equipment/Devices:None  Discharge Condition: Good CODE STATUS:Full code Diet recommendation:  Carb Modified   Brief Summary / Hospital course: This 45 years old female with PMH significant for type 1 diabetes on insulin pump, major depression, chronic anxiety disorder, neuropathic pain, ADHD, recurrent hospitalizations for DKA presented in the ED with one day history of nausea,vomiting and abdominal pain since waking up in the morning. Patient reports having problem managing her insulin pump. She is found to have diabetic ketoacidosis (blood glucose 580, UA: ketones+, BHBA >8, bicarb 9, anion gap 27) in the ED.  Patient was admitted for diabetic ketoacidosis and started on insulin drip as per DKA protocol.  She was continued on IV fluids, her anion gap  has closed.  Her blood sugar has improved.  Insulin drip was discontinued and was started on subcu insulin.  Patient was started on carb modified diet she tolerated well.  She feels much better but complains of sore throat, rapid strep test was completed which was negative.  Leukocytosis has resolved,  acute kidney injury has resolved.  Patient feels better and wants to be discharged.  Appointment has been made with Dr. Dion Body on 12/12/2020.  She was managed for below problems.  Discharge Diagnoses:  Principal Problem:   DKA, type 1 ([place]) Active Problems:   Common migraine with intractable migraine   Recurrent major depression-severe (HCC)   Generalized anxiety disorder    Thyroid nodule   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 (HCC)   Neuropathic pain   Gastroesophageal reflux disease   ADHD  High anion gap metabolic acidosis secondary to DKA; > Resolved. Patient presented with blood glucose 580, UA: ketones+, BHBA >8, bicarb 9, anion gap 27. Patient reports having problem managing insulin pump. She has developed nausea, vomiting and abdominal pain since morning. She denies any urinary symptoms or any recent infections. Admitted in stepdown. Started on insulin drip as per DKA protocol. Continue to monitor BMPs every 4-6 hours. Continue to monitor BHBA, BMP and fingersticks. Anion gap closed, insulin drip discontinued. started on Lantus and regular insulin sliding scale. Start carb modified diet. Dietitian consult.   Hyperkalemia: > Resolved Patient presented with potassium 5.7 that could be secondary to DKA. Insulin drip discontinued., monitor serum potassium.   Leukocytosis : > Improved This could be reactive, monitor WBC trend Chest x-ray no infiltrate.   Tachycardia : Improved. Her heart rate remains between 110-120. Continued low-dose metoprolol 12.30m every 12 hr.   Diabetes mellitus : Obtain diabetic counselor consult. Continue Lantus and sliding scale.   Acute kidney injury:  > Resolved. This could be prerenal due to DKA, and nausea and vomiting. Baseline serum creatinine 0.66, avoid nephrotoxic medications. Continue IV hydration, recheck a.m. labs.     Major depression /anxiety/ADHD: Resume home medications   Discharge Instructions  Discharge Instructions     Call MD for:  difficulty breathing, headache or visual disturbances   Complete by: As directed    Call MD for:  persistant dizziness or light-headedness   Complete by: As directed    Diet - low sodium  heart healthy   Complete by: As directed    Diet Carb Modified   Complete by: As directed    Discharge instructions   Complete by: As directed    Advised  to follow-up with primary care physician in 1 week. Advised to follow-up with endocrinologist Dr. Dion Body on 12/12/2020. Advised to continue prior home insulin regimen.   Increase activity slowly   Complete by: As directed       Allergies as of 12/11/2020       Reactions   Sulfa Antibiotics Nausea And Vomiting   Wellbutrin [bupropion] Other (See Comments)   "Makes me suicidal."   Amoxicillin Rash   Has patient had a PCN reaction causing immediate rash, facial/tongue/throat swelling, SOB or lightheadedness with hypotension: Yes Has patient had a PCN reaction causing severe rash involving mucus membranes or skin necrosis: No Has patient had a PCN reaction that required hospitalization: No Has patient had a PCN reaction occurring within the last 10 years: No If all of the above answers are "NO", then may proceed with Cephalosporin use.   Codeine Rash   Cannot take just codeine. Able to tolerate derivatives such as hydrocodone        Medication List     STOP taking these medications    cyclobenzaprine 5 MG tablet Commonly known as: FLEXERIL   dexamethasone 2 MG tablet Commonly known as: DECADRON   ibuprofen 800 MG tablet Commonly known as: ADVIL   meloxicam 15 MG tablet Commonly known as: MOBIC       TAKE these medications    amphetamine-dextroamphetamine 20 MG tablet Commonly known as: ADDERALL Take 20 mg by mouth 4 (four) times daily.   atorvastatin 10 MG tablet Commonly known as: LIPITOR Take 1 tablet by mouth daily.   benzonatate 200 MG capsule Commonly known as: TESSALON Take 1 capsule (200 mg total) by mouth 2 (two) times daily as needed for cough.   blood glucose meter kit and supplies Kit Dispense based on patient and insurance preference. Use up to four times daily as directed. (FOR ICD-9 250.00, 250.01).   calcium carbonate 500 MG chewable tablet Commonly known as: TUMS - dosed in mg elemental calcium Chew 1 tablet by mouth daily as needed  for indigestion or heartburn.   clonazePAM 1 MG tablet Commonly known as: KLONOPIN Take 1 mg by mouth 4 (four) times daily. For anxiety   Emgality 120 MG/ML Soaj Generic drug: Galcanezumab-gnlm Inject into skin every 30 days. (90 day supply)   gabapentin 300 MG capsule Commonly known as: NEURONTIN Take 300 mg by mouth 4 (four) times daily.   insulin aspart 100 UNIT/ML FlexTouch Pen Commonly known as: Fiasp FlexTouch Inject 1-8 Units into the skin 3 (three) times daily. What changed:  how much to take when to take this reasons to take this additional instructions   Fiasp PenFill 100 UNIT/ML Soct Generic drug: Insulin Aspart (w/Niacinamide) Inject 0-50 Units into the skin 3 (three) times daily as needed (high blood sugar). What changed: Another medication with the same name was changed. Make sure you understand how and when to take each.   insulin degludec 100 UNIT/ML FlexTouch Pen Commonly known as: TRESIBA Inject 24 Units into the skin at bedtime.   Insulin Pen Needle 31G X 5 MM Misc 1-8 Units by Does not apply route 3 (three) times daily.   levonorgestrel 20 MCG/24HR IUD Commonly known as: MIRENA 1 each by Intrauterine route once. Placed 06/2014   Nurtec 75 MG Tbdp  Generic drug: Rimegepant Sulfate Take 75 mg by mouth as needed (take 1 at onset of headache, max is 1 tablet in 24 hours).   ondansetron 4 MG tablet Commonly known as: Zofran Take 1 tablet (4 mg total) by mouth every 8 (eight) hours as needed for nausea or vomiting.   pantoprazole 40 MG tablet Commonly known as: Protonix Take 1 tablet (40 mg total) by mouth daily. What changed: when to take this   solifenacin 10 MG tablet Commonly known as: VESICARE Take 10 mg by mouth daily.   SymlinPen 60 1500 MCG/1.5ML Sopn Generic drug: Pramlintide Acetate Inject 60 Units into the skin in the morning, at noon, and at bedtime.   vortioxetine HBr 10 MG Tabs tablet Commonly known as: TRINTELLIX Take 20 mg by  mouth daily.   vortioxetine HBr 5 MG Tabs tablet Commonly known as: TRINTELLIX Take 5 mg by mouth daily.   zolpidem 12.5 MG CR tablet Commonly known as: AMBIEN CR Take 12.5 mg by mouth at bedtime.        Follow-up Information     Mindi Curling, PA-C Follow up in 1 week(s).   Specialty: Physician Assistant Contact information: [place] [address] [phone]         Gerome Apley, MD Follow up in 1 day(s).   Specialty: Endocrinology Contact information: [address] [place] [address] [phone]                Allergies  Allergen Reactions   Sulfa Antibiotics Nausea And Vomiting   Wellbutrin [Bupropion] Other (See Comments)    "Makes me suicidal."   Amoxicillin Rash    Has patient had a PCN reaction causing immediate rash, facial/tongue/throat swelling, SOB or lightheadedness with hypotension: Yes Has patient had a PCN reaction causing severe rash involving mucus membranes or skin necrosis: No Has patient had a PCN reaction that required hospitalization: No Has patient had a PCN reaction occurring within the last 10 years: No If all of the above answers are "NO", then may proceed with Cephalosporin use.    Codeine Rash    Cannot take just codeine. Able to tolerate derivatives such as hydrocodone    Consultations: NONE.   Procedures/Studies: DG Chest 2 View  Result Date: 12/09/2020 CLINICAL DATA:  Chest pain and shortness of breath. Symptoms began 0 500 today. EXAM: CHEST - 2 VIEW COMPARISON:  03/14/2019 FINDINGS: Heart size is normal. Mediastinal shadows are normal. The lungs are clear. No bronchial thickening. No infiltrate, mass, effusion or collapse. Pulmonary vascularity is normal. No bony abnormality. IMPRESSION: Normal chest Electronically Signed   By: Nelson Chimes M.D.   On: 12/09/2020 11:47    None.   Subjective: Patient was seen and examined at bedside.  Overnight events noted.   Patient reports feeling much improved.   Patient has tolerated carb modified diet , denies any nausea and vomiting.  Feels better and want to be discharged.  Discharge Exam: Vitals:   12/11/20 0700 12/11/20 0800  BP: (!) 149/69 118/60  Pulse: 85 82  Resp: 20 18  Temp:  98.2 F (36.8 C)  SpO2: 93% 94%   Vitals:   12/11/20 0500 12/11/20 0600 12/11/20 0700 12/11/20 0800  BP: (!) 123/54 (!) 124/52 (!) 149/69 118/60  Pulse: 82 82 85 82  Resp: (!) 22 19 20 18   Temp:    98.2 F (36.8 C)  TempSrc:    Oral  SpO2: 96% 96% 93%  94%  Weight:      Height:        General: Pt is alert, awake, not in acute distress Cardiovascular: RRR, S1/S2 +, no rubs, no gallops Respiratory: CTA bilaterally, no wheezing, no rhonchi Abdominal: Soft, NT, ND, bowel sounds + Extremities: no edema, no cyanosis    The results of significant diagnostics from this hospitalization (including imaging, microbiology, ancillary and laboratory) are listed below for reference.     Microbiology: Recent Results (from the past 240 hour(s))  Resp Panel by RT-PCR (Flu A&B, Covid) Nasopharyngeal Swab     Status: None   Collection Time: 12/09/20  4:45 PM   Specimen: Nasopharyngeal Swab; Nasopharyngeal(NP) swabs in vial transport medium  Result Value Ref Range Status   SARS Coronavirus 2 by RT PCR NEGATIVE NEGATIVE Final    Comment: (NOTE) SARS-CoV-2 target nucleic acids are NOT DETECTED.  The SARS-CoV-2 RNA is generally detectable in upper respiratory specimens during the acute phase of infection. The lowest concentration of SARS-CoV-2 viral copies this assay can detect is 138 copies/mL. A negative result does not preclude SARS-Cov-2 infection and should not be used as the sole basis for treatment or other patient management decisions. A negative result may occur with  improper specimen collection/handling, submission of specimen other than nasopharyngeal swab, presence of viral mutation(s) within the areas  targeted by this assay, and inadequate number of viral copies(<138 copies/mL). A negative result must be combined with clinical observations, patient history, and epidemiological information. The expected result is Negative.  Fact Sheet for Patients:  EntrepreneurPulse.com.au  Fact Sheet for Healthcare Providers:  IncredibleEmployment.be  This test is no t yet approved or cleared by the [place] FDA and  has been authorized for detection and/or diagnosis of SARS-CoV-2 by FDA under an Emergency Use Authorization (EUA). This EUA will remain  in effect (meaning this test can be used) for the duration of the COVID-19 declaration under Section 564(b)(1) of the Act, 21 U.S.C.section 360bbb-3(b)(1), unless the authorization is terminated  or revoked sooner.       Influenza A by PCR NEGATIVE NEGATIVE Final   Influenza B by PCR NEGATIVE NEGATIVE Final    Comment: (NOTE) The Xpert Xpress SARS-CoV-2/FLU/RSV plus assay is intended as an aid in the diagnosis of influenza from Nasopharyngeal swab specimens and should not be used as a sole basis for treatment. Nasal washings and aspirates are unacceptable for Xpert Xpress SARS-CoV-2/FLU/RSV testing.  Fact Sheet for Patients: EntrepreneurPulse.com.au  Fact Sheet for Healthcare Providers: IncredibleEmployment.be  This test is not yet approved or cleared by the [place] FDA and has been authorized for detection and/or diagnosis of SARS-CoV-2 by FDA under an Emergency Use Authorization (EUA). This EUA will remain in effect (meaning this test can be used) for the duration of the COVID-19 declaration under Section 564(b)(1) of the Act, 21 U.S.C. section 360bbb-3(b)(1), unless the authorization is terminated or revoked.  Performed at [facility], [place] [address] [place], [place] [address]   MRSA PCR Screening     Status: None   Collection  Time: 12/09/20  6:29 PM   Specimen: Nasal Mucosa; Nasopharyngeal  Result Value Ref Range Status   MRSA by PCR NEGATIVE NEGATIVE Final    Comment:        The GeneXpert MRSA Assay (FDA approved for NASAL specimens only), is one component of a comprehensive MRSA colonization surveillance program. It is not intended to diagnose MRSA infection nor to guide or monitor treatment for MRSA infections. Performed  at [facility], [place] [address], [place], [place] [address]   Group A Strep by PCR     Status: None   Collection Time: 12/11/20 10:12 AM   Specimen: Throat; Sterile Swab  Result Value Ref Range Status   Group A Strep by PCR NOT DETECTED NOT DETECTED Final    Comment: Performed at [facility], [place] [address], [place], [place] [address]     Labs: BNP (last 3 results) No results for input(s): BNP in the last 8760 hours. Basic Metabolic Panel: Recent Labs  Lab 12/09/20 1852 12/09/20 2254 12/10/20 0440 12/10/20 0751 12/11/20 0317 12/11/20 0934  NA 141 139 139 138 136  --   K 4.4 5.2* 3.7 3.9 5.2* 3.4*  CL 109 112* 112* 111 113*  --   CO2 11* 12* 19* 20* 18*  --   GLUCOSE 354* 233* 226* 180* 245*  --   BUN 34* 29* 26* 24* 14  --   CREATININE 1.39* 1.23* 0.88 0.94 0.82  --   CALCIUM 9.2 9.3 8.8* 8.6* 8.0*  --    Liver Function Tests: No results for input(s): AST, ALT, ALKPHOS, BILITOT, PROT, ALBUMIN in the last 168 hours. No results for input(s): LIPASE, AMYLASE in the last 168 hours. No results for input(s): AMMONIA in the last 168 hours. CBC: Recent Labs  Lab 12/09/20 1413 12/10/20 0440  WBC 22.7* 15.4*  HGB 14.4 12.3  HCT 47.0* 39.0  MCV 96.7 93.5  PLT 474* 387   Cardiac Enzymes: No results for input(s): CKTOTAL, CKMB, CKMBINDEX, TROPONINI in the last 168 hours. BNP: Invalid input(s): POCBNP CBG: Recent Labs  Lab 12/10/20 1154 12/10/20 1517 12/10/20 1709 12/10/20 2140 12/11/20 0822  GLUCAP 177* 248* 231* 243*  175*   D-Dimer No results for input(s): DDIMER in the last 72 hours. Hgb A1c Recent Labs    12/09/20 1413  HGBA1C 8.5*   Lipid Profile No results for input(s): CHOL, HDL, LDLCALC, TRIG, CHOLHDL, LDLDIRECT in the last 72 hours. Thyroid function studies No results for input(s): TSH, T4TOTAL, T3FREE, THYROIDAB in the last 72 hours.  Invalid input(s): FREET3 Anemia work up No results for input(s): VITAMINB12, FOLATE, FERRITIN, TIBC, IRON, RETICCTPCT in the last 72 hours. Urinalysis    Component Value Date/Time   COLORURINE STRAW (A) 12/09/2020 1449   APPEARANCEUR CLEAR 12/09/2020 1449   APPEARANCEUR Hazy 11/24/2013 1449   LABSPEC 1.017 12/09/2020 1449   LABSPEC 1.003 11/24/2013 1449   PHURINE 5.0 12/09/2020 1449   GLUCOSEU >=500 (A) 12/09/2020 1449   GLUCOSEU >=500 11/24/2013 1449   HGBUR MODERATE (A) 12/09/2020 1449   BILIRUBINUR NEGATIVE 12/09/2020 1449   BILIRUBINUR negative 08/28/2018 1608   BILIRUBINUR Negative 11/24/2013 1449   KETONESUR 80 (A) 12/09/2020 1449   PROTEINUR NEGATIVE 12/09/2020 1449   UROBILINOGEN 0.2 08/28/2018 1608   UROBILINOGEN 0.2 02/14/2015 1557   NITRITE NEGATIVE 12/09/2020 1449   LEUKOCYTESUR NEGATIVE 12/09/2020 1449   LEUKOCYTESUR 1+ 11/24/2013 1449   Sepsis Labs Invalid input(s): PROCALCITONIN,  WBC,  LACTICIDVEN Microbiology Recent Results (from the past 240 hour(s))  Resp Panel by RT-PCR (Flu A&B, Covid) Nasopharyngeal Swab     Status: None   Collection Time: 12/09/20  4:45 PM   Specimen: Nasopharyngeal Swab; Nasopharyngeal(NP) swabs in vial transport medium  Result Value Ref Range Status   SARS Coronavirus 2 by RT PCR NEGATIVE NEGATIVE Final    Comment: (NOTE) SARS-CoV-2 target nucleic acids are NOT DETECTED.  The SARS-CoV-2 RNA is generally detectable  in upper respiratory specimens during the acute phase of infection. The lowest concentration of SARS-CoV-2 viral copies this assay can detect is 138 copies/mL. A negative result does  not preclude SARS-Cov-2 infection and should not be used as the sole basis for treatment or other patient management decisions. A negative result may occur with  improper specimen collection/handling, submission of specimen other than nasopharyngeal swab, presence of viral mutation(s) within the areas targeted by this assay, and inadequate number of viral copies(<138 copies/mL). A negative result must be combined with clinical observations, patient history, and epidemiological information. The expected result is Negative.  Fact Sheet for Patients:  EntrepreneurPulse.com.au  Fact Sheet for Healthcare Providers:  IncredibleEmployment.be  This test is no t yet approved or cleared by the [place] FDA and  has been authorized for detection and/or diagnosis of SARS-CoV-2 by FDA under an Emergency Use Authorization (EUA). This EUA will remain  in effect (meaning this test can be used) for the duration of the COVID-19 declaration under Section 564(b)(1) of the Act, 21 U.S.C.section 360bbb-3(b)(1), unless the authorization is terminated  or revoked sooner.       Influenza A by PCR NEGATIVE NEGATIVE Final   Influenza B by PCR NEGATIVE NEGATIVE Final    Comment: (NOTE) The Xpert Xpress SARS-CoV-2/FLU/RSV plus assay is intended as an aid in the diagnosis of influenza from Nasopharyngeal swab specimens and should not be used as a sole basis for treatment. Nasal washings and aspirates are unacceptable for Xpert Xpress SARS-CoV-2/FLU/RSV testing.  Fact Sheet for Patients: EntrepreneurPulse.com.au  Fact Sheet for Healthcare Providers: IncredibleEmployment.be  This test is not yet approved or cleared by the [place] FDA and has been authorized for detection and/or diagnosis of SARS-CoV-2 by FDA under an Emergency Use Authorization (EUA). This EUA will remain in effect (meaning this test can be used) for the  duration of the COVID-19 declaration under Section 564(b)(1) of the Act, 21 U.S.C. section 360bbb-3(b)(1), unless the authorization is terminated or revoked.  Performed at [facility], [place] [address], [place], [place] [address]   MRSA PCR Screening     Status: None   Collection Time: 12/09/20  6:29 PM   Specimen: Nasal Mucosa; Nasopharyngeal  Result Value Ref Range Status   MRSA by PCR NEGATIVE NEGATIVE Final    Comment:        The GeneXpert MRSA Assay (FDA approved for NASAL specimens only), is one component of a comprehensive MRSA colonization surveillance program. It is not intended to diagnose MRSA infection nor to guide or monitor treatment for MRSA infections. Performed at [facility], [place] [address], [place], [place] [address]   Group A Strep by PCR     Status: None   Collection Time: 12/11/20 10:12 AM   Specimen: Throat; Sterile Swab  Result Value Ref Range Status   Group A Strep by PCR NOT DETECTED NOT DETECTED Final    Comment: Performed at [facility], [place] [address], [place], [place] [address]     Time coordinating discharge: Over 30 minutes  SIGNED:   Shawna Clamp, MD  [facility] 12/11/2020, 11:41 AM Pager   If 7PM-7AM, please contact night-coverage [email or web address]

## 2020-12-11 NOTE — Plan of Care (Signed)
Pt ready for discharge.  Education provided to patient.  Any questions answered.      Problem: Education: Goal: Ability to describe self-care measures that may prevent or decrease complications (Diabetes Survival Skills Education) will improve Outcome: Adequate for Discharge Goal: Individualized Educational Video(s) Outcome: Adequate for Discharge   Problem: Cardiac: Goal: Ability to maintain an adequate cardiac output will improve Outcome: Adequate for Discharge   Problem: Health Behavior/Discharge Planning: Goal: Ability to identify and utilize available resources and services will improve Outcome: Adequate for Discharge Goal: Ability to manage health-related needs will improve Outcome: Adequate for Discharge   Problem: Fluid Volume: Goal: Ability to achieve a balanced intake and output will improve Outcome: Adequate for Discharge   Problem: Metabolic: Goal: Ability to maintain appropriate glucose levels will improve Outcome: Adequate for Discharge   Problem: Nutritional: Goal: Maintenance of adequate nutrition will improve Outcome: Adequate for Discharge Goal: Maintenance of adequate weight for body size and type will improve Outcome: Adequate for Discharge   Problem: Respiratory: Goal: Will regain and/or maintain adequate ventilation Outcome: Adequate for Discharge   Problem: Urinary Elimination: Goal: Ability to achieve and maintain adequate renal perfusion and functioning will improve Outcome: Adequate for Discharge   Problem: Education: Goal: Knowledge of General Education information will improve Description: Including pain rating scale, medication(s)/side effects and non-pharmacologic comfort measures Outcome: Adequate for Discharge   Problem: Health Behavior/Discharge Planning: Goal: Ability to manage health-related needs will improve Outcome: Adequate for Discharge   Problem: Clinical Measurements: Goal: Ability to maintain clinical measurements within  normal limits will improve Outcome: Adequate for Discharge Goal: Will remain free from infection Outcome: Adequate for Discharge Goal: Diagnostic test results will improve Outcome: Adequate for Discharge Goal: Respiratory complications will improve Outcome: Adequate for Discharge Goal: Cardiovascular complication will be avoided Outcome: Adequate for Discharge   Problem: Activity: Goal: Risk for activity intolerance will decrease Outcome: Adequate for Discharge   Problem: Nutrition: Goal: Adequate nutrition will be maintained Outcome: Adequate for Discharge   Problem: Coping: Goal: Level of anxiety will decrease Outcome: Adequate for Discharge   Problem: Elimination: Goal: Will not experience complications related to bowel motility Outcome: Adequate for Discharge Goal: Will not experience complications related to urinary retention Outcome: Adequate for Discharge   Problem: Pain Managment: Goal: General experience of comfort will improve Outcome: Adequate for Discharge   Problem: Safety: Goal: Ability to remain free from injury will improve Outcome: Adequate for Discharge   Problem: Skin Integrity: Goal: Risk for impaired skin integrity will decrease Outcome: Adequate for Discharge

## 2020-12-12 ENCOUNTER — Encounter: Payer: Self-pay | Admitting: Obstetrics and Gynecology

## 2020-12-12 NOTE — Telephone Encounter (Signed)
This is Dr. Edward Jolly reviewing Dr. Salli Quarry patient messages.   I have reviewed her blood work from her hospitalization, and her blood pregnancy test called hCG was 6.3, which is considered to be slightly positive.  I recommend an office visit and another hCG to check another level.

## 2020-12-12 NOTE — Telephone Encounter (Signed)
Dr.Jertson patient  

## 2020-12-19 ENCOUNTER — Ambulatory Visit: Payer: Self-pay | Admitting: Neurology

## 2020-12-19 NOTE — Progress Notes (Deleted)
PATIENT: [name] DOB: 12/05/1975  REASON FOR VISIT: follow up HISTORY FROM: patient  HISTORY OF PRESENT ILLNESS: Today 12/19/20 [name] is a 45 year old female with history of intractable migraine headache.  Update 07/26/2020 SS: [name] is a 45 year old female with history of intractable migraine headache.  On Emgality since June, headache control much improved, no significant headache up until about a week ago.  For the last 6 days, has had constant migraine.  Previously tried [place] without benefit.  History of complicated migraine with neurological symptoms, have not given triptans.  Supposed to be on aspirin 81 mg daily.  She is a type I diabetic.Had sleep consultation with Dr. Rexene Alberts, pending sleep study.  Note neurological symptoms with this headache, current headache is to the top of the head, with migraine features, nausea.  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  Here today for evaluation unaccompanied.  HISTORY 10/08/2019 SS: [name] is a 45 year old female with history of intractable migraine headache.  She was previously evaluated in the ER for a complicated migraine, with neurological symptoms.  She is to be taking aspirin 81 mg daily as result.  She is a type I diabetic.  She is no longer taking Depakote.  For about 6 months, she had no headache, as result of less stressed not working.  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  Her headaches have returned.  She is having about 10 migraines a month.  Headaches are usually right-sided, parietal, frontal area.  Photophobia, nausea.  No further stroke like symptoms of migraine.  She sees a psychiatrist. She has been on Ajovy in the past for migraine prevention. She has been on a multitude of medications in the past including Imitrex, gabapentin, Topamax, Flexeril, Seroquel, D.H.E. 45, Phenergan, Toradol, naproxen, lithium, propranolol, Effexor, Cymbalta, amitriptyline, and  Fioricet. She says her overall health has been good.  Presents today for evaluation unaccompanied.   REVIEW OF SYSTEMS: Out of a complete 14 system review of symptoms, the patient complains only of the following symptoms, and all other reviewed systems are negative.  Headache  ALLERGIES: Allergies  Allergen Reactions   Sulfa Antibiotics Nausea And Vomiting   Wellbutrin [Bupropion] Other (See Comments)    "Makes me suicidal."   Amoxicillin Rash    Has patient had a PCN reaction causing immediate rash, facial/tongue/throat swelling, SOB or lightheadedness with hypotension: Yes Has patient had a PCN reaction causing severe rash involving mucus membranes or skin necrosis: No Has patient had a PCN reaction that required hospitalization: No Has patient had a PCN reaction occurring within the last 10 years: No If all of the above answers are "NO", then may proceed with Cephalosporin use.    Codeine Rash    Cannot take just codeine. Able to tolerate derivatives such as hydrocodone    HOME MEDICATIONS: Outpatient Medications Prior to Visit  Medication Sig Dispense Refill   amphetamine-dextroamphetamine (ADDERALL) 20 MG tablet Take 20 mg by mouth 4 (four) times daily.     atorvastatin (LIPITOR) 10 MG tablet Take 1 tablet by mouth daily.     benzonatate (TESSALON) 200 MG capsule Take 1 capsule (200 mg total) by mouth 2 (two) times daily as needed for cough. 30 capsule 0   blood glucose meter kit and supplies KIT Dispense based on patient and insurance preference. Use up to four times daily as directed. (FOR ICD-9 250.00, 250.01). 1 each 0   calcium  carbonate (TUMS - DOSED IN MG ELEMENTAL CALCIUM) 500 MG chewable tablet Chew 1 tablet by mouth daily as needed for indigestion or heartburn.     clonazePAM (KLONOPIN) 1 MG tablet Take 1 mg by mouth 4 (four) times daily. For anxiety     FIASP PENFILL 100 UNIT/ML SOCT Inject 0-50 Units into the skin 3 (three) times daily as needed (high blood sugar).      gabapentin (NEURONTIN) 300 MG capsule Take 300 mg by mouth 4 (four) times daily.     Galcanezumab-gnlm (EMGALITY) 120 MG/ML SOAJ Inject into skin every 30 days. (90 day supply) 3 mL 3   Insulin Aspart, w/Niacinamide, (FIASP FLEXTOUCH) 100 UNIT/ML SOPN Inject 1-8 Units into the skin 3 (three) times daily. 5 pen 0   insulin degludec (TRESIBA) 100 UNIT/ML SOPN FlexTouch Pen Inject 24 Units into the skin at bedtime.     Insulin Pen Needle 31G X 5 MM MISC 1-8 Units by Does not apply route 3 (three) times daily. 100 each 0   levonorgestrel (MIRENA) 20 MCG/24HR IUD 1 each by Intrauterine route once. Placed 06/2014     ondansetron (ZOFRAN) 4 MG tablet Take 1 tablet (4 mg total) by mouth every 8 (eight) hours as needed for nausea or vomiting. 20 tablet 0   pantoprazole (PROTONIX) 40 MG tablet Take 1 tablet (40 mg total) by mouth daily. 30 tablet 1   Rimegepant Sulfate (NURTEC) 75 MG TBDP Take 75 mg by mouth as needed (take 1 at onset of headache, max is 1 tablet in 24 hours). 12 tablet 11   solifenacin (VESICARE) 10 MG tablet Take 10 mg by mouth daily.     SYMLINPEN 60 1500 MCG/1.5ML SOPN Inject 60 Units into the skin in the morning, at noon, and at bedtime.     vortioxetine HBr (TRINTELLIX) 10 MG TABS tablet Take 20 mg by mouth daily.     vortioxetine HBr (TRINTELLIX) 5 MG TABS tablet Take 5 mg by mouth daily.     zolpidem (AMBIEN CR) 12.5 MG CR tablet Take 12.5 mg by mouth at bedtime.     No facility-administered medications prior to visit.    PAST MEDICAL HISTORY: Past Medical History:  Diagnosis Date   Adopted    Anxiety    Cluster headache syndrome, intractable    Depression    Depression    Migraine headache    Migraine without aura, with intractable migraine, so stated, without mention of status migrainosus 12/05/2012   Ovarian cyst    Sepsis ([place])    STD (sexually transmitted disease)    HSV II    Type I diabetes mellitus ([place])     PAST SURGICAL HISTORY: Past Surgical History:   Procedure Laterality Date   APPENDECTOMY     BUNIONECTOMY Bilateral    CHOLECYSTECTOMY     EYE SURGERY     OVARIAN CYST REMOVAL      FAMILY HISTORY: Family History  Adopted: Yes  Problem Relation Age of Onset   Diabetes Father     SOCIAL HISTORY: Social History   Socioeconomic History   Marital status: Single    Spouse name: Not on file   Number of children: 0   Years of education: Not on file   Highest education level: Some college, no degree  Occupational History    Employer: [employer]  Tobacco Use   Smoking status: Never   Smokeless tobacco: Never  Vaping Use   Vaping Use: Never used  Substance and Sexual Activity  Alcohol use: Yes    Comment: seldom   Drug use: No   Sexual activity: Yes    Partners: Male    Birth control/protection: I.U.D.  Other Topics Concern   Not on file  Social History Narrative   Patient works at [employer] and lives alone.    She may stay with friends at times.   She has no children and a college education.   Some tea and soda   Right handed    Social Determinants of Health   Financial Resource Strain: Not on file  Food Insecurity: Not on file  Transportation Needs: Not on file  Physical Activity: Not on file  Stress: Not on file  Social Connections: Not on file  Intimate Partner Violence: Not on file   PHYSICAL EXAM  There were no vitals filed for this visit.  There is no height or weight on file to calculate BMI.  Generalized: Well developed, in no acute distress   Neurological examination  Mentation: Alert oriented to time, place, history taking. Follows all commands speech and language fluent Cranial nerve II-XII: Pupil abnormality to the left, asymmetric. Extraocular movements were full, visual field were full on confrontational test. Facial sensation and strength were normal. Head turning and shoulder shrug  were normal and symmetric. Motor: The motor testing reveals 5 over 5 strength of all 4 extremities.  Good symmetric motor tone is noted throughout.  Sensory: Sensory testing is intact to soft touch on all 4 extremities. No evidence of extinction is noted.  Coordination: Cerebellar testing reveals good finger-nose-finger and heel-to-shin bilaterally.  Gait and station: Gait is normal, steady.  Reflexes: Deep tendon reflexes are symmetric and normal bilaterally.   DIAGNOSTIC DATA (LABS, IMAGING, TESTING) - I reviewed patient records, labs, notes, testing and imaging myself where available.  Lab Results  Component Value Date   WBC 15.4 (H) 12/10/2020   HGB 12.3 12/10/2020   HCT 39.0 12/10/2020   MCV 93.5 12/10/2020   PLT 387 12/10/2020      Component Value Date/Time   NA 136 12/11/2020 0317   NA 133 (L) 11/24/2013 1449   K 3.4 (L) 12/11/2020 0934   K 4.3 11/24/2013 1449   CL 113 (H) 12/11/2020 0317   CL 103 11/24/2013 1449   CO2 18 (L) 12/11/2020 0317   CO2 24 11/24/2013 1449   GLUCOSE 245 (H) 12/11/2020 0317   GLUCOSE 228 (H) 11/24/2013 1449   BUN 14 12/11/2020 0317   BUN 8 11/24/2013 1449   CREATININE 0.82 12/11/2020 0317   CREATININE 0.90 11/24/2013 1449   CALCIUM 8.0 (L) 12/11/2020 0317   CALCIUM 9.3 11/24/2013 1449   PROT 7.4 08/22/2020 1700   PROT 7.1 11/10/2013 0033   ALBUMIN 4.3 08/22/2020 1700   ALBUMIN 2.9 (L) 11/10/2013 0033   AST 15 08/22/2020 1700   AST 20 11/10/2013 0033   ALT 14 08/22/2020 1700   ALT 11 (L) 11/10/2013 0033   ALKPHOS 61 08/22/2020 1700   ALKPHOS 76 11/10/2013 0033   BILITOT 0.3 08/22/2020 1700   BILITOT 0.5 11/10/2013 0033   GFRNONAA >60 12/11/2020 0317   GFRNONAA >60 11/24/2013 1449   GFRAA >60 03/14/2019 2226   GFRAA >60 11/24/2013 1449   No results found for: CHOL, HDL, LDLCALC, LDLDIRECT, TRIG, CHOLHDL Lab Results  Component Value Date   HGBA1C 8.5 (H) 12/09/2020   Lab Results  Component Value Date   VITAMINB12 406 09/18/2018   No results found for: TSH    ASSESSMENT  AND PLAN 45 y.o. year old female  has a past medical  history of Adopted, Anxiety, Cluster headache syndrome, intractable, Depression, Depression, Migraine headache, Migraine without aura, with intractable migraine, so stated, without mention of status migrainosus (12/05/2012), Ovarian cyst, Sepsis ([place]), STD (sexually transmitted disease), and Type I diabetes mellitus ([place]). here with:  1.  Migraine headache -Prolonged headache for the last 6 days, will treat with 2 mg 3-day Decadron taper, has done well with this previously (Type 1 DM) -Continue Emgality 120 mg injection for migraine prevention, excellent benefit -Try Nurtec 75 mg as needed for acute headache, no benefit from [place] -Recommend aspirin 81 mg daily, given history of complicated migraine with neurological features -Will stay away from triptans due to history of complicated migraines -Pending sleep study with Dr. Rexene Alberts -Follow up in 6 months or sooner if needed  I spent 20 minutes of face-to-face and non-face-to-face time with patient.  This included previsit chart review, lab review, study review, order entry, electronic health record documentation, patient education.  Butler Denmark, AGNP-C, DNP 12/19/2020, 5:34 AM [facility] [address] [place], [place] [address] [phone]

## 2020-12-28 ENCOUNTER — Encounter: Payer: Self-pay | Admitting: Obstetrics and Gynecology

## 2021-02-08 ENCOUNTER — Other Ambulatory Visit: Payer: Self-pay

## 2021-02-08 ENCOUNTER — Ambulatory Visit: Payer: Medicaid Other | Admitting: Obstetrics and Gynecology

## 2021-02-14 ENCOUNTER — Other Ambulatory Visit: Payer: Self-pay | Admitting: Internal Medicine

## 2021-02-14 ENCOUNTER — Telehealth: Payer: Self-pay | Admitting: Internal Medicine

## 2021-02-14 MED ORDER — BENZONATATE 100 MG PO CAPS: 100.0000 mg | ORAL_CAPSULE | Freq: Three times a day (TID) | ORAL | 0 refills | Status: DC | PRN

## 2021-02-14 NOTE — Telephone Encounter (Signed)
Have her take Mucinex-DM 600mg  twice a day and start over the counter nasal steriod spay such as flonase or Nasacort. We can send in tessalon perles 100mg  TID prn cough. Dr. is here tomorrow and can add any other recommendations she may want.

## 2021-02-14 NOTE — Telephone Encounter (Signed)
Primary Pulmonologist: Celine Mans Last office visit and with whom: 09/26/20 with Celine Mans What do we see them for (pulmonary problems): GERD, chronic cough Last OV assessment/plan:  Assessment:  Chronic cough   Plan/Recommendations: Differential diagnosis includes GERD, post nasal drainage, neurogenic cough. Cannot exclude cough variant asthma or rhinitis. GERD most likely given her previous history of ulcers and nocturnal worsening. Will start BID PPI and obtain spirometry/Exhaled NO at next visit. I will see her back after 8 week trial.      Return to Care: Return in about 8 weeks (around 11/21/2020).   Durel Salts, MD Pulmonary and Critical Care Medicine [facility] Office:[phone]   CC: Jordan Hawks, PA-C            Patient Instructions by Charlott Holler, MD at 09/26/2020 2:00 PM  Author: Charlott Holler, MD Author Type: Physician Filed: 09/26/2020  2:19 PM  Note Status: Signed Cosign: Cosign Not Required Encounter Date: 09/26/2020  Editor: Charlott Holler, MD (Physician)               The patient should have follow up scheduled with myself in 8 weeks   Prior to next visit patient should have: Spirometry/Feno - next available, 30 minutes.    Start taking protonix 1 pill twice a day.        Was appointment offered to patient (explain)?  Pt has f/u scheduled 9/14   Reason for call: Called and spoke with pt who states that she has a constant cough that she cannot get rid of. Pt said that she has discussed this with Dr. Celine Mans at prior OVs. Pt has not been able to have a f/u with Dr. Celine Mans due to insurance lapse but said that she should have insurance again 9/1 so a f/u with Dr. Celine Mans has been scheduled for pt.  Pt said that she will occ cough up phlegm that is white in color. Pt also said that sometimes it feels like there is something stuck in her throat.  The cough is constant throughout the day and night. Pt is taking protonix which was prescribed.  Due to  this, pt is requesting something to be prescribed to see if it would help with the cough. Pt did mention that she has been on tessalon in the past when she has had a flare-up of the cough.  Tammy, please advise.   Allergies  Allergen Reactions   Sulfa Antibiotics Nausea And Vomiting   Wellbutrin [Bupropion] Other (See Comments)    "Makes me suicidal."   Amoxicillin Rash    Has patient had a PCN reaction causing immediate rash, facial/tongue/throat swelling, SOB or lightheadedness with hypotension: Yes Has patient had a PCN reaction causing severe rash involving mucus membranes or skin necrosis: No Has patient had a PCN reaction that required hospitalization: No Has patient had a PCN reaction occurring within the last 10 years: No If all of the above answers are "NO", then may proceed with Cephalosporin use.    Codeine Rash    Cannot take just codeine. Able to tolerate derivatives such as hydrocodone    Immunization History  Administered Date(s) Administered   Hepatitis B, adult 01/25/2017   Influenza Inj Mdck Quad Pf 08/01/2018   Influenza,inj,quad, With Preservative 08/01/2018   PFIZER(Purple Top)SARS-COV-2 Vaccination 09/04/2019, 10/01/2019, 08/09/2020   PPD Test 01/23/2017, 08/13/2018   Tdap 11/22/2010

## 2021-02-14 NOTE — Telephone Encounter (Signed)
Pt sent a MyChart message stating;  "Cough and excess phlegm in throat"  Pls regard; [phone]

## 2021-02-14 NOTE — Telephone Encounter (Signed)
Called and spoke with Patient.  Waynetta Sandy, NP recommendations given.  Understanding stated.  Tessalon prescription sent to requested [facility].  Nothing further at this time.

## 2021-03-15 ENCOUNTER — Ambulatory Visit: Payer: Medicaid Other | Admitting: Internal Medicine

## 2021-04-06 ENCOUNTER — Encounter: Payer: Self-pay | Admitting: Neurology

## 2021-04-06 ENCOUNTER — Other Ambulatory Visit: Payer: Self-pay | Admitting: Neurology

## 2021-04-06 MED ORDER — CHLORPROMAZINE HCL 50 MG PO TABS: ORAL_TABLET | ORAL | 0 refills | Status: DC

## 2021-04-11 ENCOUNTER — Other Ambulatory Visit: Payer: Self-pay | Admitting: Neurology

## 2021-04-11 MED ORDER — CHLORPROMAZINE HCL 50 MG PO TABS: ORAL_TABLET | ORAL | 0 refills | Status: DC

## 2021-04-14 ENCOUNTER — Other Ambulatory Visit: Payer: Self-pay | Admitting: Neurology

## 2021-04-16 ENCOUNTER — Encounter: Payer: Self-pay | Admitting: Neurology

## 2021-04-17 ENCOUNTER — Other Ambulatory Visit: Payer: Self-pay | Admitting: Neurology

## 2021-04-17 MED ORDER — DEXAMETHASONE 2 MG PO TABS: ORAL_TABLET | ORAL | 0 refills | Status: DC

## 2021-05-28 ENCOUNTER — Other Ambulatory Visit: Payer: Self-pay | Admitting: Neurology

## 2021-05-28 ENCOUNTER — Telehealth: Payer: Self-pay | Admitting: Neurology

## 2021-05-28 MED ORDER — DEXAMETHASONE 2 MG PO TABS: ORAL_TABLET | ORAL | 3 refills | Status: DC

## 2021-05-28 NOTE — Telephone Encounter (Signed)
Paged the on-call with a headache for 10 days. I prescribed her decadron for 3 days (that's what Dr. Anne Hahn did last) and advised she needed an appointment, haven't seen her in close to a year, can have a video appointment.  Tori/jillian can you call and make her a video appt with sarah? thanks

## 2021-05-29 ENCOUNTER — Ambulatory Visit: Payer: Medicaid Other | Admitting: Obstetrics and Gynecology

## 2021-05-29 NOTE — Telephone Encounter (Signed)
Noted thank you

## 2021-05-29 NOTE — Telephone Encounter (Signed)
Pt requested an appt due to her migraines. Assigned pt to Dr. Marjory Lies on 1/17023 at 11:30a.

## 2021-05-30 ENCOUNTER — Telehealth: Payer: Self-pay | Admitting: Neurology

## 2021-05-30 MED ORDER — ONDANSETRON 4 MG PO TBDP: 4.0000 mg | ORAL_TABLET | Freq: Three times a day (TID) | ORAL | 3 refills | Status: AC | PRN

## 2021-05-30 NOTE — Telephone Encounter (Signed)
Patient called on-call physician for frequent headaches, nausea, vomiting, needs Zofran refilled  Meds ordered this encounter  Medications   ondansetron (ZOFRAN-ODT) 4 MG disintegrating tablet    Sig: Take 1 tablet (4 mg total) by mouth every 8 (eight) hours as needed for nausea or vomiting.    Dispense:  20 tablet    Refill:  3

## 2021-06-01 ENCOUNTER — Telehealth: Payer: Self-pay | Admitting: Neurology

## 2021-06-01 MED ORDER — PROMETHAZINE HCL 25 MG PO TABS: 25.0000 mg | ORAL_TABLET | Freq: Three times a day (TID) | ORAL | 0 refills | Status: AC | PRN

## 2021-06-01 NOTE — Addendum Note (Signed)
Addended by: Lindell Spar C on: 06/01/2021 04:41 PM   Modules accepted: Orders

## 2021-06-01 NOTE — Telephone Encounter (Signed)
Pt states for a couple of weeks now she is dealing with sever migraines.  Pt is having sensitivity to light.feeling nauseated  and vomiting.  Please call pt to discuss.

## 2021-06-01 NOTE — Telephone Encounter (Addendum)
Note from 05/28/21 from Dr. Lucia Gaskins:  Paged the on-call with a headache for 10 days. I prescribed her decadron for 3 days (that's what Dr. Anne Hahn did last) and advised she needed an appointment, haven't seen her in close to a year, can have a video appointment. _____________________________________  I spoke to the patient today. She has not picked up the Decadron yet. Instructed her to start the medication. She also ask for nausea medication stronger than ondansetron. Per vo by Dr. Epimenio Foot, send in rx for promethazine 25mg , one tab TID, #12 x 0.   Pt is aware the prescriptions are at the pharmacy.

## 2021-06-16 ENCOUNTER — Ambulatory Visit: Payer: Medicaid Other | Admitting: Obstetrics and Gynecology

## 2021-07-11 ENCOUNTER — Encounter: Payer: Self-pay | Admitting: Nurse Practitioner

## 2021-07-12 DIAGNOSIS — R32 Unspecified urinary incontinence: Secondary | ICD-10-CM | POA: Insufficient documentation

## 2021-07-12 DIAGNOSIS — J320 Chronic maxillary sinusitis: Secondary | ICD-10-CM | POA: Insufficient documentation

## 2021-07-12 DIAGNOSIS — R053 Chronic cough: Secondary | ICD-10-CM | POA: Insufficient documentation

## 2021-07-18 ENCOUNTER — Telehealth: Payer: Self-pay

## 2021-07-18 ENCOUNTER — Ambulatory Visit (INDEPENDENT_AMBULATORY_CARE_PROVIDER_SITE_OTHER): Payer: BC Managed Care – PPO | Admitting: Diagnostic Neuroimaging

## 2021-07-18 ENCOUNTER — Encounter: Payer: Self-pay | Admitting: Diagnostic Neuroimaging

## 2021-07-18 VITALS — BP 132/84 | HR 103 | Ht 61.0 in | Wt 163.0 lb

## 2021-07-18 DIAGNOSIS — G43019 Migraine without aura, intractable, without status migrainosus: Secondary | ICD-10-CM | POA: Diagnosis not present

## 2021-07-18 MED ORDER — EMGALITY 120 MG/ML ~~LOC~~ SOAJ: SUBCUTANEOUS | 3 refills | Status: AC

## 2021-07-18 MED ORDER — NURTEC 75 MG PO TBDP: 75.0000 mg | ORAL_TABLET | ORAL | 11 refills | Status: AC | PRN

## 2021-07-18 NOTE — Progress Notes (Signed)
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 Feels like both are contributing to her headaches. Previously seen by Dr. Jannifer Franklin     HISTORY OF PRESENT ILLNESS:   UPDATE (07/18/21, VRP): Since last visit, doing about the same. Symptoms are stable. Severity is moderate. No alleviating or aggravating factors. Tolerating emgality and nurtec. Avg 15 days HA per month.  Right-sided head and neck pain, lasting hours, days or weeks at a time.  Associated nausea and vomiting, sensitivity to light and sound.  Initially was having headaches 6 months out of the year now only 3 to 4 months every year.  When she has headaches they can last 1 to 2 weeks at a time.  UPDATE (07/26/20 SS): Ms. [name] is a 46 year old female with history of intractable migraine headache.  On Emgality since June, headache control much improved, no significant headache up until about a week ago.  For the last 6 days, has had constant migraine.  Previously tried [place] without benefit.  History of complicated migraine with neurological symptoms, have not given triptans.  Supposed to be on aspirin 81 mg daily.  She is a type I diabetic.Had sleep consultation with Dr. Rexene Alberts, pending sleep study.  Note neurological symptoms with this headache, current headache is to the top of the head, with migraine features, nausea.  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  Here today for evaluation unaccompanied.  PRIOR HPI (10/08/2019 SS): Ms. Bagg is a 46 year old female with history of intractable migraine headache.  She  was previously evaluated in the ER for a complicated migraine, with neurological symptoms.  She is to be taking aspirin 81 mg daily as result.  She is a type I diabetic.  She is no longer taking Depakote.  For about 6 months, she had no headache, as result of less stressed not working.  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  Her headaches have returned.  She is having about 10 migraines a month.  Headaches are usually right-sided, parietal, frontal area.  Photophobia, nausea.  No further stroke like symptoms of migraine.  She sees a psychiatrist. She has been on Ajovy in the past for migraine prevention. She has been on a multitude of medications in the past including Imitrex, gabapentin, Topamax, Flexeril, Seroquel, [employer], Phenergan, Toradol, naproxen, lithium, propranolol, Effexor, Cymbalta, amitriptyline, and Fioricet. She says her overall health has been good.  Presents today for evaluation unaccompanied.   REVIEW OF SYSTEMS: Full 14 system review of systems performed and negative with exception of: as per HPI.  ALLERGIES: Allergies  Allergen Reactions   Sulfa Antibiotics Nausea And Vomiting   Wellbutrin [Bupropion] Other (See Comments)    "Makes me suicidal."   Amoxicillin Rash    Has patient had a PCN reaction causing immediate rash, facial/tongue/throat swelling, SOB or lightheadedness with hypotension: Yes Has patient had a PCN reaction causing severe rash involving mucus membranes or skin necrosis: No Has patient had a PCN reaction that required hospitalization: No Has patient had a PCN reaction occurring within the  last 10 years: No If all of the above answers are "NO", then may proceed with Cephalosporin use.    Codeine Rash    Cannot take just codeine. Able to tolerate derivatives such as hydrocodone    HOME MEDICATIONS: Outpatient Medications Prior to Visit  Medication Sig Dispense Refill   amphetamine-dextroamphetamine (ADDERALL) 20 MG tablet Take 20 mg by  mouth 4 (four) times daily.     atorvastatin (LIPITOR) 10 MG tablet Take 1 tablet by mouth daily.     benzonatate (TESSALON) 100 MG capsule Take 1 capsule (100 mg total) by mouth 3 (three) times daily as needed for cough. 90 capsule 0   benzonatate (TESSALON) 200 MG capsule Take 1 capsule (200 mg total) by mouth 2 (two) times daily as needed for cough. 30 capsule 0   blood glucose meter kit and supplies KIT Dispense based on patient and insurance preference. Use up to four times daily as directed. (FOR ICD-9 250.00, 250.01). 1 each 0   calcium carbonate (TUMS - DOSED IN MG ELEMENTAL CALCIUM) 500 MG chewable tablet Chew 1 tablet by mouth daily as needed for indigestion or heartburn.     chlorproMAZINE (THORAZINE) 50 MG tablet 1 tablet twice daily for 5 days 10 tablet 0   clonazePAM (KLONOPIN) 1 MG tablet Take 1 mg by mouth 4 (four) times daily. For anxiety     dexamethasone (DECADRON) 2 MG tablet Take 3 tablets the first day, 2 the second and 1 the third day 6 tablet 3   FIASP PENFILL 100 UNIT/ML SOCT Inject 0-50 Units into the skin 3 (three) times daily as needed (high blood sugar).     gabapentin (NEURONTIN) 300 MG capsule Take 300 mg by mouth 4 (four) times daily.     Insulin Aspart, w/Niacinamide, (FIASP FLEXTOUCH) 100 UNIT/ML SOPN Inject 1-8 Units into the skin 3 (three) times daily. 5 pen 0   insulin degludec (TRESIBA) 100 UNIT/ML SOPN FlexTouch Pen Inject 24 Units into the skin at bedtime.     Insulin Pen Needle 31G X 5 MM MISC 1-8 Units by Does not apply route 3 (three) times daily. 100 each 0   levonorgestrel (MIRENA) 20 MCG/24HR IUD 1 each by Intrauterine route once. Placed 06/2014     ondansetron (ZOFRAN-ODT) 4 MG disintegrating tablet Take 1 tablet (4 mg total) by mouth every 8 (eight) hours as needed for nausea or vomiting. 20 tablet 3   pantoprazole (PROTONIX) 40 MG tablet Take 1 tablet (40 mg total) by mouth daily. 30 tablet 1   promethazine (PHENERGAN) 25 MG tablet Take 1 tablet (25  mg total) by mouth every 8 (eight) hours as needed for nausea or vomiting. 12 tablet 0   solifenacin (VESICARE) 10 MG tablet Take 10 mg by mouth daily.     SYMLINPEN 60 1500 MCG/1.5ML SOPN Inject 60 Units into the skin in the morning, at noon, and at bedtime.     vortioxetine HBr (TRINTELLIX) 10 MG TABS tablet Take 20 mg by mouth daily.     vortioxetine HBr (TRINTELLIX) 5 MG TABS tablet Take 5 mg by mouth daily.     zolpidem (AMBIEN CR) 12.5 MG CR tablet Take 12.5 mg by mouth at bedtime.     Galcanezumab-gnlm (EMGALITY) 120 MG/ML SOAJ Inject into skin every 30 days. (90 day supply) 3 mL 3   Rimegepant Sulfate (NURTEC) 75 MG TBDP Take 75 mg by mouth as needed (take 1 at onset of headache, max is 1 tablet in 24 hours). 12  tablet 11   doxycycline (ADOXA) 100 MG tablet Take 100 mg by mouth 2 (two) times daily.     No facility-administered medications prior to visit.      PHYSICAL EXAM  GENERAL EXAM/CONSTITUTIONAL: Vitals:  Vitals:   07/18/21 1121  BP: 132/84  Pulse: (!) 103  SpO2: 98%  Weight: 163 lb (73.9 kg)  Height: 5' 1"  (1.549 m)   Body mass index is 30.8 kg/m. Wt Readings from Last 3 Encounters:  07/18/21 163 lb (73.9 kg)  12/09/20 162 lb (73.5 kg)  09/26/20 166 lb 3.2 oz (75.4 kg)   Patient is in no distress; well developed, nourished and groomed; neck is supple  CARDIOVASCULAR: Examination of carotid arteries is normal; no carotid bruits Regular rate and rhythm, no murmurs Examination of peripheral vascular system by observation and palpation is normal  EYES: Ophthalmoscopic exam of optic discs and posterior segments is normal; no papilledema or hemorrhages No results found.  MUSCULOSKELETAL: Gait, strength, tone, movements noted in Neurologic exam below  NEUROLOGIC: MENTAL STATUS:  No flowsheet data found. awake, alert, oriented to person, place and time recent and remote memory intact normal attention and concentration language fluent, comprehension intact,  naming intact fund of knowledge appropriate  CRANIAL NERVE:  2nd, 3rd, 4th, 6th - RIGHT PUPIL 4MM REACTIVE; LEFT PUPIL IRREG POST-SURGICAL, NO REACTION, NO VISION; extraocular muscles intact, no nystagmus 5th - facial sensation symmetric 7th - facial strength symmetric 8th - hearing intact 9th - palate elevates symmetrically, uvula midline 11th - shoulder shrug symmetric 12th - tongue protrusion midline  MOTOR:  normal bulk and tone, full strength in the BUE, BLE  SENSORY:  normal and symmetric to light touch  COORDINATION:  finger-nose-finger, fine finger movements normal  REFLEXES:  deep tendon reflexes present and symmetric  GAIT/STATION:  narrow based gait     DIAGNOSTIC DATA (LABS, IMAGING, TESTING) - I reviewed patient records, labs, notes, testing and imaging myself where available.  Lab Results  Component Value Date   WBC 15.4 (H) 12/10/2020   HGB 12.3 12/10/2020   HCT 39.0 12/10/2020   MCV 93.5 12/10/2020   PLT 387 12/10/2020      Component Value Date/Time   NA 136 12/11/2020 0317   NA 133 (L) 11/24/2013 1449   K 3.4 (L) 12/11/2020 0934   K 4.3 11/24/2013 1449   CL 113 (H) 12/11/2020 0317   CL 103 11/24/2013 1449   CO2 18 (L) 12/11/2020 0317   CO2 24 11/24/2013 1449   GLUCOSE 245 (H) 12/11/2020 0317   GLUCOSE 228 (H) 11/24/2013 1449   BUN 14 12/11/2020 0317   BUN 8 11/24/2013 1449   CREATININE 0.82 12/11/2020 0317   CREATININE 0.90 11/24/2013 1449   CALCIUM 8.0 (L) 12/11/2020 0317   CALCIUM 9.3 11/24/2013 1449   PROT 7.4 08/22/2020 1700   PROT 7.1 11/10/2013 0033   ALBUMIN 4.3 08/22/2020 1700   ALBUMIN 2.9 (L) 11/10/2013 0033   AST 15 08/22/2020 1700   AST 20 11/10/2013 0033   ALT 14 08/22/2020 1700   ALT 11 (L) 11/10/2013 0033   ALKPHOS 61 08/22/2020 1700   ALKPHOS 76 11/10/2013 0033   BILITOT 0.3 08/22/2020 1700   BILITOT 0.5 11/10/2013 0033   GFRNONAA >60 12/11/2020 0317   GFRNONAA >60 11/24/2013 1449   GFRAA >60 03/14/2019 2226    GFRAA >60 11/24/2013 1449   No results found for: CHOL, HDL, LDLCALC, LDLDIRECT, TRIG, CHOLHDL Lab Results  Component Value Date   HGBA1C 8.5 (  H) 12/09/2020   Lab Results  Component Value Date   VITAMINB12 406 09/18/2018   No results found for: TSH  11/25/18 MRI brain - Normal brain MRI.  06/02/21 CT sinus 1.  Minimal mucosal thickening in the inferior aspect of the right maxillary sinus without specific imaging evidence for acute sinusitis.  2.  No acute findings involving the face.  3.  Abnormal hyperattenuation and curvilinear calcification in the left globe in this patient with reported history of retinal detachment status post repair.    ASSESSMENT AND PLAN  46 y.o. year old female here with:   Dx:  1. Common migraine with intractable migraine      PLAN:  MIGRAINE WITH AURA - continue emgality (helping) - continue nurtec (helping)  Meds ordered this encounter  Medications   Galcanezumab-gnlm (EMGALITY) 120 MG/ML SOAJ    Sig: Inject into skin every 30 days. (90 day supply)    Dispense:  3 mL    Refill:  3   Rimegepant Sulfate (NURTEC) 75 MG TBDP    Sig: Take 75 mg by mouth as needed (take 1 at onset of headache, max is 1 tablet in 24 hours).    Dispense:  12 tablet    Refill:  11   Return in about 1 year (around 07/18/2022) for with NP Butler Denmark), MyChart visit (15 min).    Penni Bombard, MD 12/22/9213, 15:82 AM Certified in Neurology, Neurophysiology and Neuroimaging  [facility] [address] [place], [place] [address] [phone]

## 2021-07-18 NOTE — Progress Notes (Signed)
Emgality Rx faxed to [employer].

## 2021-07-18 NOTE — Telephone Encounter (Signed)
I have submitted a PA request for Emgality on CMM, Key: [id number]. Awaiting determination from [place].

## 2021-07-18 NOTE — Patient Instructions (Signed)
MIGRAINE WITH AURA - continue emgality - continue nurtec

## 2021-07-19 ENCOUNTER — Other Ambulatory Visit: Payer: Self-pay

## 2021-07-19 NOTE — Telephone Encounter (Signed)
Emgality approved 07/18/21-07/18/22. Faxed approval letter to pharmacy.

## 2021-07-24 ENCOUNTER — Other Ambulatory Visit: Payer: Self-pay

## 2021-07-26 ENCOUNTER — Ambulatory Visit: Payer: Medicaid Other | Admitting: Nurse Practitioner

## 2021-07-31 ENCOUNTER — Encounter: Payer: Self-pay | Admitting: Diagnostic Neuroimaging

## 2021-08-15 ENCOUNTER — Ambulatory Visit: Payer: Medicaid Other | Admitting: Nurse Practitioner

## 2021-08-15 ENCOUNTER — Emergency Department (HOSPITAL_COMMUNITY): Payer: BC Managed Care – PPO

## 2021-08-15 ENCOUNTER — Other Ambulatory Visit: Payer: Self-pay

## 2021-08-15 ENCOUNTER — Emergency Department (HOSPITAL_COMMUNITY)
Admission: EM | Admit: 2021-08-15 | Discharge: 2021-08-15 | Disposition: A | Discharge status: Home / Self Care | Payer: BC Managed Care – PPO | Attending: Emergency Medicine | Admitting: Emergency Medicine

## 2021-08-15 DIAGNOSIS — R739 Hyperglycemia, unspecified: Secondary | ICD-10-CM

## 2021-08-15 DIAGNOSIS — R1084 Generalized abdominal pain: Secondary | ICD-10-CM | POA: Insufficient documentation

## 2021-08-15 DIAGNOSIS — Z794 Long term (current) use of insulin: Secondary | ICD-10-CM | POA: Insufficient documentation

## 2021-08-15 DIAGNOSIS — E1065 Type 1 diabetes mellitus with hyperglycemia: Secondary | ICD-10-CM | POA: Insufficient documentation

## 2021-08-15 DIAGNOSIS — Z8616 Personal history of COVID-19: Secondary | ICD-10-CM | POA: Diagnosis not present

## 2021-08-15 DIAGNOSIS — R112 Nausea with vomiting, unspecified: Secondary | ICD-10-CM

## 2021-08-15 LAB — CBC WITH DIFFERENTIAL/PLATELET
Abs Immature Granulocytes: 0.03 10*3/uL (ref 0.00–0.07)
Basophils Absolute: 0.1 10*3/uL (ref 0.0–0.1)
Basophils Relative: 1 %
Eosinophils Absolute: 0.1 10*3/uL (ref 0.0–0.5)
Eosinophils Relative: 1 %
HCT: 45.4 % (ref 36.0–46.0)
Hemoglobin: 14.7 g/dL (ref 12.0–15.0)
Immature Granulocytes: 0 %
Lymphocytes Relative: 15 %
Lymphs Abs: 1.7 10*3/uL (ref 0.7–4.0)
MCH: 29.3 pg (ref 26.0–34.0)
MCHC: 32.4 g/dL (ref 30.0–36.0)
MCV: 90.6 fL (ref 80.0–100.0)
Monocytes Absolute: 0.4 10*3/uL (ref 0.1–1.0)
Monocytes Relative: 4 %
Neutro Abs: 8.8 10*3/uL — ABNORMAL HIGH (ref 1.7–7.7)
Neutrophils Relative %: 79 %
Platelets: 460 10*3/uL — ABNORMAL HIGH (ref 150–400)
RBC: 5.01 MIL/uL (ref 3.87–5.11)
RDW: 12.3 % (ref 11.5–15.5)
WBC: 11.1 10*3/uL — ABNORMAL HIGH (ref 4.0–10.5)
nRBC: 0 % (ref 0.0–0.2)

## 2021-08-15 LAB — BLOOD GAS, VENOUS
Acid-base deficit: 0.4 mmol/L (ref 0.0–2.0)
Bicarbonate: 25.4 mmol/L (ref 20.0–28.0)
O2 Saturation: 74.3 %
Patient temperature: 37
pCO2, Ven: 45 mmHg (ref 44–60)
pH, Ven: 7.36 (ref 7.25–7.43)
pO2, Ven: 45 mmHg (ref 32–45)

## 2021-08-15 LAB — URINALYSIS, ROUTINE W REFLEX MICROSCOPIC
Bilirubin Urine: NEGATIVE
Glucose, UA: >=500 mg/dL — AB
Hgb urine dipstick: NEGATIVE
Ketones, ur: NEGATIVE mg/dL
Nitrite: NEGATIVE
Protein, ur: NEGATIVE mg/dL
Specific Gravity, Urine: 1.028 (ref 1.005–1.030)
pH: 7 (ref 5.0–8.0)

## 2021-08-15 LAB — BASIC METABOLIC PANEL
Anion gap: 10 (ref 5–15)
BUN: 17 mg/dL (ref 6–20)
CO2: 23 mmol/L (ref 22–32)
Calcium: 9.4 mg/dL (ref 8.9–10.3)
Chloride: 102 mmol/L (ref 98–111)
Creatinine, Ser: 0.69 mg/dL (ref 0.44–1.00)
GFR, Estimated: >60 mL/min (ref >60)
Glucose, Bld: 346 mg/dL — ABNORMAL HIGH (ref 70–99)
Potassium: 3.9 mmol/L (ref 3.5–5.1)
Sodium: 135 mmol/L (ref 135–145)

## 2021-08-15 LAB — HEPATIC FUNCTION PANEL
ALT: 55 U/L — ABNORMAL HIGH (ref 0–44)
AST: 47 U/L — ABNORMAL HIGH (ref 15–41)
Albumin: 4.2 g/dL (ref 3.5–5.0)
Alkaline Phosphatase: 73 U/L (ref 38–126)
Bilirubin, Direct: <0.1 mg/dL (ref 0.0–0.2)
Total Bilirubin: 0.3 mg/dL (ref 0.3–1.2)
Total Protein: 7.6 g/dL (ref 6.5–8.1)

## 2021-08-15 LAB — CBG MONITORING, ED
Glucose-Capillary: 213 mg/dL — ABNORMAL HIGH (ref 70–99)
Glucose-Capillary: 301 mg/dL — ABNORMAL HIGH (ref 70–99)
Glucose-Capillary: 296 mg/dL — ABNORMAL HIGH (ref 70–99)
Glucose-Capillary: 246 mg/dL — ABNORMAL HIGH (ref 70–99)

## 2021-08-15 LAB — LIPASE, BLOOD: Lipase: 26 U/L (ref 11–51)

## 2021-08-15 MED ORDER — PANTOPRAZOLE SODIUM 40 MG IV SOLR
40.0000 mg | Freq: Once | INTRAVENOUS | Status: AC
  Administered 2021-08-15: 40 mg via INTRAVENOUS
  Filled 2021-08-15: qty 10

## 2021-08-15 MED ORDER — ONDANSETRON HCL 4 MG/2ML IJ SOLN
4.0000 mg | Freq: Once | INTRAMUSCULAR | Status: AC
  Administered 2021-08-15: 4 mg via INTRAVENOUS
  Filled 2021-08-15: qty 2

## 2021-08-15 MED ORDER — LIDOCAINE VISCOUS HCL 2 % MT SOLN
15.0000 mL | Freq: Once | OROMUCOSAL | Status: AC
  Administered 2021-08-15: 15 mL via ORAL
  Filled 2021-08-15: qty 15

## 2021-08-15 MED ORDER — LACTATED RINGERS IV BOLUS
1000.0000 mL | Freq: Once | INTRAVENOUS | Status: AC
  Administered 2021-08-15: 1000 mL via INTRAVENOUS

## 2021-08-15 MED ORDER — ALUM & MAG HYDROXIDE-SIMETH 200-200-20 MG/5ML PO SUSP
30.0000 mL | Freq: Once | ORAL | Status: AC
  Administered 2021-08-15: 30 mL via ORAL
  Filled 2021-08-15: qty 30

## 2021-08-15 MED ORDER — ONDANSETRON 4 MG PO TBDP
ORAL_TABLET | ORAL | 0 refills | Status: DC
Start: 2021-08-15 — End: 2021-10-10

## 2021-08-15 NOTE — ED Notes (Signed)
Pt NAD, a/ox4. Pt verbalizes understanding of all DC and f/u instructions. All questions answered. Pt walks with steady gait to lobby at DC.  ? ?

## 2021-08-15 NOTE — ED Provider Notes (Signed)
[facility] DEPT Provider Note   CSN: [id number] Arrival date & time: 08/15/21  1025     History  Chief Complaint  Patient presents with   Hyperglycemia    [name] is a 46 y.o. female.  [name] is a 46 y/o F with a hx of type 1 diabetes with frequent DKA admissions, migraines, GERD, PUD and anxiety who presents to the ED for nausea with emesis and diffuse abdominal pain that began around 0400 this morning. Pt reports she was recently ill and tested positive for COVID on 1/31. She recalls sleeping frequently secondary to fatigue since then but was cleared to return to work yesterday by her PCP. Pt reports this morning she developed nausea with multiple non-bloody vomiting episodes. No diarrhea. Pt states her blood sugars have read "high" over the last couple of days but she attributed this to recently being ill. Her abd pain is described as a cramping sensation that she believes is secondary to her vomiting. She has a hx of GERD and ulcers, but states this does not feel similar to that. No hx of gastroparesis in the past. Was previously on protonix but stopped this medication. She is s/p cholecystectomy and appendectomy. Pt has taken Phenergan x2 without relief of her nausea. Per review of her chart, pt was last admitted 12/09/20-12/11/20 for DKA. She also notes mild mid-sternal chest pain with SOB that began after vomiting that she reports feels similar to when is in DKA. She also attributes this to her frequent episodes of vomiting. No radiating pains to the neck, arm, back, or jaw. Pain does not worsen with inspiration. Sometimes worse with movement.  No hx of cardiac disease. Pt reports she presented today for her nausea with vomiting that she thought was due to DKA. She has no urinary symptoms. No polydipsia. She notes chronic and unchanged waxing/waning RLQ pain that feels similar to her prior ovarian cyst pain, and has an appointment to her OBGYN in  less than two weeks for evaluation of that. She has no other medical concerns at this time.    The history is provided by the patient and medical records.      Home Medications Prior to Admission medications   Medication Sig Start Date End Date Taking? Authorizing Provider  ondansetron (ZOFRAN-ODT) 4 MG disintegrating tablet 52m ODT q4 hours prn nausea/vomit 08/15/21  Yes Liliya Fullenwider N, PA-C  albuterol (VENTOLIN HFA) 108 (90 Base) MCG/ACT inhaler Inhale into the lungs. 05/02/21   [provider]  amphetamine-dextroamphetamine (ADDERALL) 20 MG tablet Take 20 mg by mouth 4 (four) times daily. 10/24/17   [provider]  Armodafinil 250 MG tablet Take 250 mg by mouth daily. 03/31/21   [provider]  atorvastatin (LIPITOR) 10 MG tablet Take 1 tablet by mouth daily. 09/26/20   [provider]  Azelastine HCl 137 MCG/SPRAY SOLN Place 137 mcg into both nostrils. 07/03/21   [provider]  blood glucose meter kit and supplies KIT Dispense based on patient and insurance preference. Use up to four times daily as directed. (FOR ICD-9 250.00, 250.01). 07/23/18   HKayleen Memos DO  Bremelanotide Acetate (VYLEESI) 1.75 MG/0.3ML SOAJ 1.75 mg. 03/02/21   [provider]  brompheniramine-pseudoephedrine-DM 30-2-10 MG/5ML syrup Take by mouth. 05/17/21   [provider]  budesonide-formoterol (SYMBICORT) 80-4.5 MCG/ACT inhaler Inhale into the lungs. 05/02/21   [provider]  calcium carbonate (TUMS - DOSED IN MG ELEMENTAL CALCIUM) 500 MG chewable tablet  Chew 1 tablet by mouth daily as needed for indigestion or heartburn.    [provider]  chlorproMAZINE (THORAZINE) 50 MG tablet 1 tablet twice daily for 5 days 04/11/21   Kathrynn Ducking, MD  clonazePAM (KLONOPIN) 1 MG tablet Take 1 mg by mouth 3 (three) times daily as needed. 05/25/20   [provider]  dexamethasone (DECADRON) 2 MG tablet Take 3 tablets the first day, 2 the  second and 1 the third day 05/28/21   Melvenia Beam, MD  doxycycline (ADOXA) 100 MG tablet Take 100 mg by mouth 2 (two) times daily. 05/22/21   [provider]  FIASP PENFILL 100 UNIT/ML SOCT Inject 0-50 Units into the skin 3 (three) times daily as needed (high blood sugar). 10/14/20   [provider]  fluticasone (FLONASE) 50 MCG/ACT nasal spray Place into the nose. 07/12/21 07/12/22  [provider]  gabapentin (NEURONTIN) 300 MG capsule Take 300 mg by mouth 4 (four) times daily. 08/27/18   [provider]  Galcanezumab-gnlm (EMGALITY) 120 MG/ML SOAJ Inject into skin every 30 days. (90 day supply) 07/18/21   Penumalli, Earlean Polka, MD  Glucagon (BAQSIMI ONE PACK) 3 MG/DOSE POWD Place 3 mg into the nose. 02/24/21   [provider]  Insulin Aspart, w/Niacinamide, (FIASP FLEXTOUCH) 100 UNIT/ML SOPN Inject 1-8 Units into the skin 3 (three) times daily. 09/19/18   Eugenie Filler, MD  insulin degludec (TRESIBA) 100 UNIT/ML SOPN FlexTouch Pen Inject 24 Units into the skin at bedtime. 09/19/18   [provider]  Insulin Pen Needle 31G X 5 MM MISC 1-8 Units by Does not apply route 3 (three) times daily. 09/19/18   Eugenie Filler, MD  levonorgestrel (MIRENA) 20 MCG/24HR IUD 1 each by Intrauterine route once. Placed 06/2014    [provider]  ondansetron (ZOFRAN-ODT) 4 MG disintegrating tablet Take 1 tablet (4 mg total) by mouth every 8 (eight) hours as needed for nausea or vomiting. 05/30/21   Marcial Pacas, MD  pantoprazole (PROTONIX) 40 MG tablet Take 1 tablet (40 mg total) by mouth daily. 12/11/20   Shawna Clamp, MD  promethazine (PHENERGAN) 25 MG tablet Take 1 tablet (25 mg total) by mouth every 8 (eight) hours as needed for nausea or vomiting. 06/01/21   Sater, Nanine Means, MD  Rimegepant Sulfate (NURTEC) 75 MG TBDP Take 75 mg by mouth as needed (take 1 at onset of headache, max is 1 tablet in 24 hours). 07/18/21   Penumalli, Earlean Polka, MD   solifenacin (VESICARE) 10 MG tablet Take 10 mg by mouth daily. 08/09/20   [provider]  sucralfate (CARAFATE) 1 g tablet Take 1 g by mouth. 10/31/19   [provider]  SYMLINPEN 60 1500 MCG/1.5ML SOPN Inject 60 Units into the skin in the morning, at noon, and at bedtime. 09/27/20   [provider]  TRINTELLIX 20 MG TABS tablet Take 20 mg by mouth daily. 07/12/21   [provider]  vortioxetine HBr (TRINTELLIX) 10 MG TABS tablet Take 20 mg by mouth daily.    [provider]  zolpidem (AMBIEN CR) 12.5 MG CR tablet Take 12.5 mg by mouth at bedtime.    [provider]  dicyclomine (BENTYL) 20 MG tablet Take 1 tablet (20 mg total) by mouth 2 (two) times daily. Patient not taking: Reported on 12/20/2018 04/01/18 12/20/18  Drenda Freeze, MD  insulin aspart (NOVOLOG) 100 UNIT/ML injection Inject 0-9 Units into the skin 3 (three) times daily with  meals. Patient not taking: Reported on 12/20/2018 09/19/18 12/20/18  Eugenie Filler, MD  Insulin Glargine [facility]) 100 UNIT/ML SOPN Inject 0.22 mLs (22 Units total) into the skin daily. Patient not taking: Reported on 12/20/2018 09/19/18 12/20/18  Eugenie Filler, MD  misoprostol (CYTOTEC) 200 MCG tablet Place 2 tablets vaginally 6-12 hours prior to IUD insertion Patient not taking: Reported on 12/20/2018 08/25/18 12/20/18  Salvadore Dom, MD      Allergies    Sulfa antibiotics, Wellbutrin [bupropion], Amoxicillin, and Codeine    Review of Systems   Review of Systems  Constitutional:  Negative for chills and fever.  HENT: Negative.    Respiratory:  Negative for cough and shortness of breath.   Cardiovascular:  Positive for chest pain.  Gastrointestinal:  Positive for abdominal pain, nausea and vomiting. Negative for constipation and diarrhea.  Genitourinary:  Negative for dysuria and frequency.  Musculoskeletal:  Negative for arthralgias and myalgias.  Skin:  Negative for color change  and rash.  All other systems reviewed and are negative.  Physical Exam Updated Vital Signs BP (!) 115/58    Pulse 81    Temp 99.4 F (37.4 C) (Oral)    Resp 13    Ht _0  (1.549 m)    Wt 75.2 kg    SpO2 96%    BMI 31.33 kg/m  Physical Exam Vitals and nursing note reviewed.  Constitutional:      General: She is not in acute distress.    Appearance: Normal appearance. She is well-developed. She is not diaphoretic.     Comments: Alert, mentating well, no acute distress  HENT:     Head: Normocephalic and atraumatic.     Mouth/Throat:     Mouth: Mucous membranes are moist.     Pharynx: Oropharynx is clear.  Eyes:     General:        Right eye: No discharge.        Left eye: No discharge.     Pupils: Pupils are equal, round, and reactive to light.  Cardiovascular:     Rate and Rhythm: Normal rate and regular rhythm.     Pulses: Normal pulses.     Heart sounds: Normal heart sounds. No murmur heard.   No friction rub. No gallop.  Pulmonary:     Effort: Pulmonary effort is normal. No respiratory distress.     Breath sounds: Normal breath sounds. No wheezing or rales.     Comments: Respirations equal and unlabored, patient able to speak in full sentences, lungs clear to auscultation bilaterally  Abdominal:     General: Bowel sounds are normal. There is no distension.     Palpations: Abdomen is soft. There is no mass.     Tenderness: There is abdominal tenderness. There is no guarding.     Comments: Abdomen soft, nondistended, BS present throughout, mild generalized tenderness, most notably in the epigastric region, no guarding or rebound tenderness  Musculoskeletal:        General: No deformity.     Cervical back: Neck supple.  Skin:    General: Skin is warm and dry.     Capillary Refill: Capillary refill takes less than 2 seconds.  Neurological:     Mental Status: She is alert and oriented to person, place, and time.     Coordination: Coordination normal.     Comments: Speech  is clear, able to follow commands Moves extremities without ataxia, coordination intact  Psychiatric:  Mood and Affect: Mood normal.        Behavior: Behavior normal.    ED Results / Procedures / Treatments   Labs (all labs ordered are listed, but only abnormal results are displayed) Labs Reviewed  BASIC METABOLIC PANEL - Abnormal; Notable for the following components:      Result Value   Glucose, Bld 346 (*)    All other components within normal limits  CBC WITH DIFFERENTIAL/PLATELET - Abnormal; Notable for the following components:   WBC 11.1 (*)    Platelets 460 (*)    Neutro Abs 8.8 (*)    All other components within normal limits  URINALYSIS, ROUTINE W REFLEX MICROSCOPIC - Abnormal; Notable for the following components:   Glucose, UA >=500 (*)    Leukocytes,Ua TRACE (*)    Bacteria, UA RARE (*)    All other components within normal limits  HEPATIC FUNCTION PANEL - Abnormal; Notable for the following components:   AST 47 (*)    ALT 55 (*)    All other components within normal limits  CBG MONITORING, ED - Abnormal; Notable for the following components:   Glucose-Capillary 213 (*)    All other components within normal limits  CBG MONITORING, ED - Abnormal; Notable for the following components:   Glucose-Capillary 301 (*)    All other components within normal limits  CBG MONITORING, ED - Abnormal; Notable for the following components:   Glucose-Capillary 296 (*)    All other components within normal limits  CBG MONITORING, ED - Abnormal; Notable for the following components:   Glucose-Capillary 246 (*)    All other components within normal limits  BLOOD GAS, VENOUS  LIPASE, BLOOD    EKG EKG Interpretation  Date/Time:  Tuesday August 15 2021 18:25:07 EST Ventricular Rate:  92 PR Interval:  154 QRS Duration: 76 QT Interval:  343 QTC Calculation: 425 R Axis:   62 Text Interpretation: Sinus rhythm Confirmed by Lennice Sites ([id number]) on 08/16/2021 5:18:31  PM  Radiology DG Chest 2 View  Result Date: 08/15/2021 CLINICAL DATA:  Provided history: Chest pain. EXAM: CHEST - 2 VIEW COMPARISON:  Prior chest radiographs 12/09/2020 and earlier. FINDINGS: Heart size within normal limits. No appreciable airspace consolidation. No evidence of pleural effusion or pneumothorax. No acute bony abnormality identified. IMPRESSION: No evidence of active cardiopulmonary disease. Electronically Signed   By: Kellie Simmering D.O.   On: 08/15/2021 19:43    Procedures Procedures    Medications Ordered in ED Medications  ondansetron (ZOFRAN) injection 4 mg (4 mg Intravenous Given 08/15/21 1742)  lactated ringers bolus 1,000 mL (1,000 mLs Intravenous New Bag/Given 08/15/21 1742)  pantoprazole (PROTONIX) injection 40 mg (40 mg Intravenous Given 08/15/21 1837)  alum & mag hydroxide-simeth (MAALOX/MYLANTA) 200-200-20 MG/5ML suspension 30 mL (30 mLs Oral Given 08/15/21 1837)    And  lidocaine (XYLOCAINE) 2 % viscous mouth solution 15 mL (15 mLs Oral Given 08/15/21 1837)    ED Course/ Medical Decision Making/ A&P                           This patient presents to the ED for concern of Nausea, vomiting, abdominal pain and hyperglycemia, this involves an extensive number of treatment options, and is a complaint that carries with it a high risk of complications and morbidity.  The differential diagnosis includes DKA, gastritis, gastroenteritis, PUD, colitis, bowel obstruction, perforation, GERD.   Co morbidities that complicate the patient evaluation  Type  1 diabetes, depression   Additional history obtained:  Additional history obtained from chart review External records from outside source obtained and reviewed including recent family medicine visits, previous lab work   Lab Tests:  I Ordered, and personally interpreted labs.  The pertinent results include: Blood sugars initially in the 300s, but anion gap and CO2 are normal, no signs of DKA, very mild elevation in  LFTs, but normal renal function and all other electrolytes unremarkable.  Minimal leukocytosis and normal hemoglobin.  VBG WNL, urinalysis with glucosuria, but no ketones present.  Overall evaluation does not suggest DKA   Imaging Studies ordered:  I ordered imaging studies including chest x-ray I independently visualized and interpreted imaging which showed no pneumonia or other cardiopulmonary disease present I agree with the radiologist interpretation   Cardiac Monitoring:  The patient was maintained on a cardiac monitor.  I personally viewed and interpreted the cardiac monitored which showed an underlying rhythm of: Normal sinus rhythm EKG: Normal sinus rhythm with no concerning ischemic changes   Medicines ordered and prescription drug management:  I ordered medication including IV fluids, Zofran, Protonix, GI cocktail for hyperglycemia, vomiting and abdominal pain Reevaluation of the patient after these medicines showed that the patient improved I have reviewed the patients home medicines and have made adjustments as needed   Test Considered:  I considered abdominal imaging but given the patient's tenderness is primarily in the epigastric region and resolved with treatment suspect this may be due to her underlying peptic ulcer disease, overall low suspicion for other acute intra-abdominal pathology    Problem List / ED Course:  Hyperglycemia, vomiting and abdominal pain since this morning.  Fortunately work-up does not show DKA, blood sugar is mildly elevated but the rest of patient's lab work is reassuring.  Symptoms treated supportively with significant improvement.  Has underlying history of peptic ulcer disease and recently stopped her Protonix wonder if that may have led to exacerbation in her pain and vomiting. Patient now tolerating p.o. fluids, blood sugar is improving.  Patient feeling much better and would like to go home.   Reevaluation:  After the interventions  noted above, I reevaluated the patient and found that they have :improved   Dispostion:  After consideration of the diagnostic results and the patients response to treatment, I considered admission, but given no evidence of DKA and abdominal pain has improved with treatment here in the ED feel patient is appropriate for discharge home with continued outpatient treatment follow-up.         Final Clinical Impression(s) / ED Diagnoses Final diagnoses:  Hyperglycemia  Nausea and vomiting, unspecified vomiting type  Generalized abdominal pain    Rx / DC Orders ED Discharge Orders          Ordered    ondansetron (ZOFRAN-ODT) 4 MG disintegrating tablet        08/15/21 2131              Jacqlyn Larsen, PA-C 08/17/21 1029    Drenda Freeze, MD 08/18/21 [phone]

## 2021-08-15 NOTE — ED Provider Triage Note (Signed)
Emergency Medicine Provider Triage Evaluation Note  VALLORIE NICCOLI , a 46 y.o. female  was evaluated in triage.  Pt complains of hyperglycemia onset 1/30.  She notes that her blood glucose monitor will read "high" and intermittently come down to the 200 range.  Patient also was diagnosed with COVID on 07/31/2021 patient is cleared to return back to work today.  Patient has associated nausea, vomiting, abdominal cramping.  Patient took 2 doses of Phenergan today with her last dose being at 830 am.  Denies fever, chills.  Review of Systems  Positive: As per HPI above Negative: Fever, chills  Physical Exam  BP 137/87 (BP Location: Left Arm)    Pulse (!) 117    Temp 99 F (37.2 C) (Oral)    Resp 18    SpO2 99%  Gen:   Awake, no distress, patient vomiting during exam. Resp:  Normal effort  MSK:   Moves extremities without difficulty  Other:  No abdominal tenderness to palpation  Medical Decision Making  Medically screening exam initiated at 10:42 AM.  Appropriate orders placed.  JASLENE MARSTELLER was informed that the remainder of the evaluation will be completed by another provider, this initial triage assessment does not replace that evaluation, and the importance of remaining in the ED until their evaluation is complete.    Joshual Terrio A, PA-C 08/15/21 1106

## 2021-08-15 NOTE — ED Triage Notes (Addendum)
Patient states "sugars have been reading HI since 07/31/20 and states when does come down it is in the 200's. Patient states she was diagnosed with Covid on 07/31/21.  CBG in triage-213.  Patient has N/V, abdominal cramping. Patient actively vomiting in triage. Patient states she last took Phenergan 0830 today. Patient reports headaches, but states she has a history of cluster headaches.

## 2021-08-15 NOTE — ED Notes (Signed)
Patient states that she is a hard stick and does not want labs drawn until she is being stuck for an IV.

## 2021-08-15 NOTE — Discharge Instructions (Addendum)
Your evaluation today is reassuring, lab work does not show signs of DKA.  Your blood sugar has improved here in the ED.  Abdominal pain, nausea and vomiting may be due to known ulcers.  Please continue taking your Protonix.  Use Zofran as needed.  Monitor your blood sugars closely, if you have worsening symptoms return for reevaluation.

## 2021-08-15 NOTE — ED Notes (Signed)
Pt mild distress in bed, appears ill-feeling. A/ox4, pt states she was DX Covid 1/31 and her BGL has been elevated since then. States she came tonight for n/v that was unbearable. Pt denies CP, SOB, ABD pain, diarrhea or fever. ABD soft, tender

## 2021-08-15 NOTE — ED Notes (Signed)
Patient transported to X-ray 

## 2021-08-22 NOTE — Progress Notes (Deleted)
46 y.o. G0P0000 Single White or Caucasian Not Hispanic or Latino female here for annual exam.   ?  ? ?No LMP recorded. (Menstrual status: IUD).          ?Sexually active: {yes no:314532}  ?The current method of family planning is {contraception:315051}.    ?Exercising: {yes no:314532}  {types:19826} ?Smoker:  {YES NO:22349} ? ?Health Maintenance: ?Pap:  12-04-17 normal Neg HPV ?History of abnormal Pap:  {YES NO:22349} ?MMG:  *** ?BMD:   N/A ?Colonoscopy: N/A ?TDaP:  2012 ?Gardasil: *** ? ? reports that she has never smoked. She has never used smokeless tobacco. She reports current alcohol use. She reports that she does not use drugs. ? ?Past Medical History:  ?Diagnosis Date  ? Adopted   ? Anxiety   ? Cluster headache syndrome, intractable   ? Depression   ? Depression   ? Migraine headache   ? Migraine without aura, with intractable migraine, so stated, without mention of status migrainosus 12/05/2012  ? Ovarian cyst   ? Sepsis ([place])   ? STD (sexually transmitted disease)   ? HSV II   ? Type I diabetes mellitus ([place])   ? ? ?Past Surgical History:  ?Procedure Laterality Date  ? APPENDECTOMY    ? BUNIONECTOMY Bilateral   ? CHOLECYSTECTOMY    ? EYE SURGERY    ? OVARIAN CYST REMOVAL    ? ? ?Current Outpatient Medications  ?Medication Sig Dispense Refill  ? albuterol (VENTOLIN HFA) 108 (90 Base) MCG/ACT inhaler Inhale into the lungs.    ? amphetamine-dextroamphetamine (ADDERALL) 20 MG tablet Take 20 mg by mouth 4 (four) times daily.    ? Armodafinil 250 MG tablet Take 250 mg by mouth daily.    ? atorvastatin (LIPITOR) 10 MG tablet Take 1 tablet by mouth daily.    ? Azelastine HCl 137 MCG/SPRAY SOLN Place 137 mcg into both nostrils.    ? blood glucose meter kit and supplies KIT Dispense based on patient and insurance preference. Use up to four times daily as directed. (FOR ICD-9 250.00, 250.01). 1 each 0  ? Bremelanotide Acetate (VYLEESI) 1.75 MG/0.3ML SOAJ 1.75 mg.    ? brompheniramine-pseudoephedrine-DM 30-2-10 MG/5ML  syrup Take by mouth.    ? budesonide-formoterol (SYMBICORT) 80-4.5 MCG/ACT inhaler Inhale into the lungs.    ? calcium carbonate (TUMS - DOSED IN MG ELEMENTAL CALCIUM) 500 MG chewable tablet Chew 1 tablet by mouth daily as needed for indigestion or heartburn.    ? chlorproMAZINE (THORAZINE) 50 MG tablet 1 tablet twice daily for 5 days 10 tablet 0  ? clonazePAM (KLONOPIN) 1 MG tablet Take 1 mg by mouth 3 (three) times daily as needed.    ? dexamethasone (DECADRON) 2 MG tablet Take 3 tablets the first day, 2 the second and 1 the third day 6 tablet 3  ? doxycycline (ADOXA) 100 MG tablet Take 100 mg by mouth 2 (two) times daily.    ? FIASP PENFILL 100 UNIT/ML SOCT Inject 0-50 Units into the skin 3 (three) times daily as needed (high blood sugar).    ? fluticasone (FLONASE) 50 MCG/ACT nasal spray Place into the nose.    ? gabapentin (NEURONTIN) 300 MG capsule Take 300 mg by mouth 4 (four) times daily.    ? Galcanezumab-gnlm (EMGALITY) 120 MG/ML SOAJ Inject into skin every 30 days. (90 day supply) 3 mL 3  ? Glucagon (BAQSIMI ONE PACK) 3 MG/DOSE POWD Place 3 mg into the nose.    ? Insulin Aspart, w/Niacinamide, (FIASP  FLEXTOUCH) 100 UNIT/ML SOPN Inject 1-8 Units into the skin 3 (three) times daily. 5 pen 0  ? insulin degludec (TRESIBA) 100 UNIT/ML SOPN FlexTouch Pen Inject 24 Units into the skin at bedtime.    ? Insulin Pen Needle 31G X 5 MM MISC 1-8 Units by Does not apply route 3 (three) times daily. 100 each 0  ? levonorgestrel (MIRENA) 20 MCG/24HR IUD 1 each by Intrauterine route once. Placed 06/2014    ? ondansetron (ZOFRAN-ODT) 4 MG disintegrating tablet Take 1 tablet (4 mg total) by mouth every 8 (eight) hours as needed for nausea or vomiting. 20 tablet 3  ? ondansetron (ZOFRAN-ODT) 4 MG disintegrating tablet 44m ODT q4 hours prn nausea/vomit 10 tablet 0  ? pantoprazole (PROTONIX) 40 MG tablet Take 1 tablet (40 mg total) by mouth daily. 30 tablet 1  ? promethazine (PHENERGAN) 25 MG tablet Take 1 tablet (25 mg total)  by mouth every 8 (eight) hours as needed for nausea or vomiting. 12 tablet 0  ? Rimegepant Sulfate (NURTEC) 75 MG TBDP Take 75 mg by mouth as needed (take 1 at onset of headache, max is 1 tablet in 24 hours). 12 tablet 11  ? solifenacin (VESICARE) 10 MG tablet Take 10 mg by mouth daily.    ? sucralfate (CARAFATE) 1 g tablet Take 1 g by mouth.    ? SYMLINPEN 60 1500 MCG/1.5ML SOPN Inject 60 Units into the skin in the morning, at noon, and at bedtime.    ? TRINTELLIX 20 MG TABS tablet Take 20 mg by mouth daily.    ? vortioxetine HBr (TRINTELLIX) 10 MG TABS tablet Take 20 mg by mouth daily.    ? zolpidem (AMBIEN CR) 12.5 MG CR tablet Take 12.5 mg by mouth at bedtime.    ? ?No current facility-administered medications for this visit.  ? ? ?Family History  ?Adopted: Yes  ?Problem Relation Age of Onset  ? Diabetes Father   ? ? ?Review of Systems ? ?Exam:   ?There were no vitals taken for this visit.  Weight change: @WEIGHTCHANGE @ Height:      ?Ht Readings from Last 3 Encounters:  ?08/15/21 5' 1"  (1.549 m)  ?07/18/21 5' 1"  (1.549 m)  ?12/09/20 5' 1"  (1.549 m)  ? ? ?General appearance: alert, cooperative and appears stated age ?Head: Normocephalic, without obvious abnormality, atraumatic ?Neck: no adenopathy, supple, symmetrical, trachea midline and thyroid {CHL AMB PHY EX THYROID NORM DEFAULT:[phone]::"normal to inspection and palpation"} ?Lungs: clear to auscultation bilaterally ?Cardiovascular: regular rate and rhythm ?Breasts: {Exam; breast:13139::"normal appearance, no masses or tenderness"} ?Abdomen: soft, non-tender; non distended,  no masses,  no organomegaly ?Extremities: extremities normal, atraumatic, no cyanosis or edema ?Skin: Skin color, texture, turgor normal. No rashes or lesions ?Lymph nodes: Cervical, supraclavicular, and axillary nodes normal. ?No abnormal inguinal nodes palpated ?Neurologic: Grossly normal ? ? ?Pelvic: External genitalia:  no lesions ?             Urethra:  normal appearing urethra  with no masses, tenderness or lesions ?             Bartholins and Skenes: normal    ?             Vagina: normal appearing vagina with normal color and discharge, no lesions ?             Cervix: {CHL AMB PHY EX CERVIX NORM DEFAULT:[phone]::"no lesions"} ?              ?Bimanual Exam:  Uterus:  {CHL AMB PHY EX UTERUS NORM [phone]::"normal size, contour, position, consistency, mobility, non-tender"} ?             Adnexa: {CHL AMB PHY EX ADNEXA NO MASS [phone]::"no mass, fullness, tenderness"} ?              Rectovaginal: Confirms ?              Anus:  normal sphincter tone, no lesions ? ?*** chaperoned for the exam. ? ?A:  Well Woman with normal exam ? ?P:    ? ?  ?

## 2021-09-01 ENCOUNTER — Ambulatory Visit: Payer: Medicaid Other | Admitting: Obstetrics and Gynecology

## 2021-09-04 ENCOUNTER — Telehealth: Payer: Self-pay | Admitting: Neurology

## 2021-09-04 ENCOUNTER — Telehealth: Payer: Self-pay | Admitting: *Deleted

## 2021-09-04 NOTE — Telephone Encounter (Signed)
Pt called me back we discussed options and she declined injections at this time. She sts she had a old refill on file for Decadron and refilled yesterday and started today.  ? ?Pt reports she took the 3 tablets in the morning, will take 2 tomorrow, and 1 on Wed. She sts she has felt better since starting this and is planning on finishing course. She sts she will call back if this does not help.  ?

## 2021-09-04 NOTE — Telephone Encounter (Signed)
Nurtec PA, key [id number], G43.019 ?Your information has been submitted to [employer].  ?If [employer] has not responded to your request within 24 hours, contact [employer] at [phone] ?

## 2021-09-04 NOTE — Telephone Encounter (Signed)
I called pt. No answer, left a message asking pt to call me back. ?[name], Candice Bayley, MD  You; [place] Pink, RN 4 minutes ago (9:27 AM)  ? ?Would not recommend steroids given diabetes and hyperglycemia issues. Continue currents meds. Could consider toradol, compazine injections if available.  ? ?-VRP   ? ?

## 2021-09-04 NOTE — Telephone Encounter (Signed)
Nurtec: You have requested more than the maximum quantity allowed by your plan. ?Current plan approved criteria cover up to: ?- 16 tablets per month of Nurtec ODT 75mg  ?Your request has been partially approved. You have been approved for the maximum ?quantity that your plan covers for a duration of 12 months. Your request for additional ?quantities of the requested drug and strength has been denied. ?The Rx is only for 12 tablets. Patient should be able to get refills without issue.  ?

## 2021-09-04 NOTE — Telephone Encounter (Signed)
Late entry: I received an after-hours message on 09/03/2021 at 10:35 AM.  I was able to connect with the patient.  She reported a cluster migraine for the past 4 to 5 days.  She had recently seen Dr. Marjory Lies, she previously followed with Dr. Anne Hahn. She was advised in her January 2023 visit to continue with as needed Nurtec and Emgality injections.  She reported that in the past, she had been treated with Decadron.  I suggested that she try adding Tylenol as needed and Advil sparingly.  I would send a message to Dr. Marjory Lies to discuss additional treatment options.  I explained to her that we should be cautious with steroid use repeatedly.  I reviewed her chart, she had taken dexamethasone in late November and also in October 2022, from what I can see.  She was in agreement with discussing further with Dr. Marjory Lies and trying Tylenol and Advil alternatingly for now. ?

## 2021-09-05 ENCOUNTER — Ambulatory Visit: Payer: Medicaid Other | Admitting: Nurse Practitioner

## 2021-09-07 ENCOUNTER — Encounter: Payer: Self-pay | Admitting: Diagnostic Neuroimaging

## 2021-09-13 ENCOUNTER — Telehealth: Payer: Medicaid Other | Admitting: Neurology
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
## 2021-09-20 ENCOUNTER — Encounter: Payer: Self-pay | Admitting: Obstetrics and Gynecology

## 2021-09-20 ENCOUNTER — Other Ambulatory Visit: Payer: Self-pay

## 2021-09-20 ENCOUNTER — Ambulatory Visit (INDEPENDENT_AMBULATORY_CARE_PROVIDER_SITE_OTHER): Payer: BC Managed Care – PPO | Admitting: Obstetrics and Gynecology

## 2021-09-20 VITALS — BP 130/74 | HR 77 | Ht 61.0 in | Wt 158.0 lb

## 2021-09-20 DIAGNOSIS — R102 Pelvic and perineal pain: Secondary | ICD-10-CM

## 2021-09-20 DIAGNOSIS — R109 Unspecified abdominal pain: Secondary | ICD-10-CM

## 2021-09-20 DIAGNOSIS — Z01419 Encounter for gynecological examination (general) (routine) without abnormal findings: Secondary | ICD-10-CM | POA: Diagnosis not present

## 2021-09-20 DIAGNOSIS — Z30431 Encounter for routine checking of intrauterine contraceptive device: Secondary | ICD-10-CM

## 2021-09-20 DIAGNOSIS — W460XXA Contact with hypodermic needle, initial encounter: Secondary | ICD-10-CM

## 2021-09-20 NOTE — Patient Instructions (Signed)

## 2021-09-21 LAB — HEPATITIS C ANTIBODY
Hepatitis C Ab: NONREACTIVE
SIGNAL TO CUT-OFF: <0.02 (ref <1.00)

## 2021-09-21 LAB — HEPATITIS B SURFACE ANTIGEN: Hepatitis B Surface Ag: NONREACTIVE

## 2021-09-21 LAB — HIV ANTIBODY (ROUTINE TESTING W REFLEX): HIV 1&2 Ab, 4th Generation: NONREACTIVE

## 2021-09-22 ENCOUNTER — Ambulatory Visit: Payer: Medicaid Other | Admitting: Nurse Practitioner

## 2021-10-10 ENCOUNTER — Encounter: Payer: Self-pay | Admitting: Internal Medicine

## 2021-10-10 ENCOUNTER — Other Ambulatory Visit: Payer: Self-pay | Admitting: Neurology

## 2021-10-10 ENCOUNTER — Ambulatory Visit: Payer: BC Managed Care – PPO | Admitting: Nurse Practitioner

## 2021-10-10 ENCOUNTER — Encounter: Payer: Self-pay | Admitting: Nurse Practitioner

## 2021-10-10 VITALS — BP 114/70 | HR 102 | Ht 61.0 in | Wt 156.0 lb

## 2021-10-10 DIAGNOSIS — R112 Nausea with vomiting, unspecified: Secondary | ICD-10-CM

## 2021-10-10 NOTE — Progress Notes (Signed)
? ? ?Assessment  ? ?Patient profile:  ?[name] is a 46 y.o. female new to practice, referred by PCP.for history of PUD. Her PMH is significant for DM!, kidney stones, depression, anxiety, PUD, cluster migraines, GERD, appendectomy See PMH below for any additional history ? ?Type I DM / chronic, intermittent nausea and vomiting.  We discussed they symptoms are most likely secondary to gastroparesis.  ? ?History of erosive gastritis / duodenitis on EGDs in 2019 and 2020.  Probably NSAID related per [facility] notes. 2019 biopsies ( retrieved from her EMR on phone) were c/w mild chronic inactive gastritis. No H.pylori or IM. Duodenitis with focal erosion. Associated gastric metaplasia and reactive changes ? ?Type I DM ( uncontrolled). Followed by Endocrinology. Just started Optavia diet ( carb controlled) and blood sugars have already improved.  ? ?Colon cancer screening. Age for screening. .  ? ?Plan  ? ?Her blood sugars have improved on Optavia diet. We discussed giving it more time to see if vomiting improves but she is worried about recurrent erosive gastritis. Will proceed with EGD for further evaluation. The risks and benefits of EGD with possible biopsies were discussed with the patient who agrees to proceed.  ?If EGD is negative then consider gastric emptying study Surprisingly she doesn't recall ever having tried Reglan. However, she has a history of anxiety / depression and is on several psych medications so would need to proceed with caution.   ?Will need to postpone a screening colonoscopy until she feels better and can tolerate a prep ? ?History of Present Illness  ? ?Chief complaint: nausea ? ?[name] has a history of Type I DM. She has a history of chronic nausea / vomiting and abdominal pain. She was followed years ago by [facility] but more recently by [facility] in [place]from 2019 -2021.   ? ?Most recent GI History:  ?2019 - EGD with [facility] showed diffuse gastric  erythema and erosions in the antrum. Erythema throughout the small bowel. Biopsies obtained but not found in Care Everywhere. Patient pulled up biopsies on phone -  ?gastric biopsies showed mild chronic inactive gastritis. No H.pylori or IM. Duodenal biopsies showed duodenitis with focal erosion. Associated gastric metaplasia and reactive changes ? ?Sept 2020 Seen again at [facility] for mid to upper abdomen pain with eating, vomiting, hematemesis as well as constipation. She underwent another EGD Oct 2020 with findings of antral ulcerations ? ?April 2021 - visit with [facility] for abdominal pain and GERD symptoms. Continued on BID  PPi, carafate added and discussion held about adding a TCA for functional dyspepsia.  ? ?Interval history:  ?[name] comes for evaluation of nausea. She is nauseated 4 to 5 days out of the week. She says that she doesn't have gastroparesis. She describes having had a gastric emptying study years ago.  She takes Zofran and Phenergan. Mainly uses the Zofran. No associated weight loss. She occasionally gets upper abdominal cramps associated with the nausea and vomiting but predominantly she is bothered by the nausea / vomiting  ? ?[name] was diagnosed with diabetes at age 89. She is followed by Endocrinology. She used to have an insulin pump but says it malfunctioned so much and led to DKA that she no longer trusts the pump. Endocrinology has spoken with her about a different pump or possibly pancreas transplant. She is thinking about her options. She has significant difficulty managing blood sugar. Marland Kitchen. However several days she started [name] diet ( carb controlled) and blood  sugars have already significantly improved. She discussed this diet with her Endocrinologist.  ? ?[name] is concerned that she has recurrent gastric ulcers. She isn't taking NSAIDs. She hasn't had hematemesis, no black stools.  ? ?Over the years [name] has had multiple CT scans for abdominal pain, nausea /  vomiting. Scand for most part have been unrevealing other than constipation, non-obstructing renal stones, pyelonephritis ( remote scan) and in 2018 she had what appeared to be enteritis.   ? ?Previous Labs / Imaging:: ? ?  Latest Ref Rng & Units 08/15/2021  ?  1:16 PM 12/10/2020  ?  4:40 AM 12/09/2020  ?  2:13 PM  ?CBC  ?WBC 4.0 - 10.5 K/uL 11.1   15.4   22.7    ?Hemoglobin 12.0 - 15.0 g/dL 40.7   68.0   88.1    ?Hematocrit 36.0 - 46.0 % 45.4   39.0   47.0    ?Platelets 150 - 400 K/uL 460   387   474    ? ? ?Lab Results  ?Component Value Date  ? LIPASE 26 08/15/2021  ? ?TSH 1.4  ? ?Previous GI Evaluations  ? ?Endoscopies: ? ?October 2019 EGD - [facility] ?- Normal esophagus ?? Diffuse erythema in the stomach with erosions in the antrum. Biopsies obtained ?? Erythema throughout the small bowel. Biopsies obtained ?? We will start the patient on pantoprazole 40 mg daily ?? Await pathology results ?? If the biopsies of the duodenum are suspicious for celiac disease, we will check serologies  ? ?**gastric and duodenal biopsies ( pulled up on her phone).  ?--gastric biopsies- mild chronic inactive gastritis. No H.pylori or IM ?-- Duodenal  - duodenitis with focal erosion. Associated gastric metaplasia and reactive changes ? ?October 2020 EGD - [facility] ?- IMPRESSIONS and PLAN: ?? Normal esophagus ?? Scattered ulcerations seen in the antrum. ?? Normal duodenum ?? Continue pantoprazole twice daily ?? If the patient continues have symptoms despite high-dose PPIs, consider a TCA functional dyspepsia  ? ?Imaging:  ?2008 CTAP w/ contrast for N/V and abdominal pain ?--negative ? ?2013 CTAP wo contrast for flank pain ?--non-obstructing kidney stone ? ?2014 CTAP wo contrast left sided pain ?--possibly ovarian cyst ? ?March 2015  CTAP wo contrast for left flank pain ?--abundant stool in right colon an cecum and maybe mild ileus ? ?May 2015 CT scan for N/V and abdominal pain  ?--Possible pyelonephritis ? ?July 2018  CT  wo contrastrenal stone protocol for eft flank pain ?- IMPRESSION: ?1. Small bowel thickening with minimal mesenteric edema and mild adjacent adenopathy in the left upper quadrant. Suspect enteritis,may be infectious or inflammatory. Adjacent lymph nodes are likely reactive, however a follow-up contrast-enhanced CT could be considered in 6-12 weeks to ensure resolution. ?2.  No renal stones or obstructive uropathy ? ?Nov 2019  CTAP w/ contrast for abd pain and nausea ?- no acute findings ? ?June 2020 CT scan renal protocol ?--non-obstructing left kidney stone. No acute findings ? ?Sept 2020 CTAP w/ contrast for abdominal pain and vomiting ?--No acute findings ? ?Aug 18, 2020 CTAP wo contrast for left flank pain  ?--no acute finding ?--cholecystectomy ?--Non obstructing left kidney stone ? ?Feb 22 CTAP w/ contrast for abdominal pain ?--evidence for cholecystectomy.  ? ?Barium swallow w/ tablet April 2021 (patient pulled up on her phone) ?- Stasis of barium identified within  esophagus with some retropulsion into cervical esophagus. ? ? ?Past Medical History:  ?Diagnosis Date  ? Adopted   ?  Anxiety   ? Cluster headache syndrome, intractable   ? Depression   ? Depression   ? Migraine headache   ? Migraine without aura, with intractable migraine, so stated, without mention of status migrainosus 12/05/2012  ? Ovarian cyst   ? Sepsis (HCC)   ? STD (sexually transmitted disease)   ? HSV II   ? Type I diabetes mellitus (HCC)   ? ?Past Surgical History:  ?Procedure Laterality Date  ? APPENDECTOMY    ? BUNIONECTOMY Bilateral   ? CHOLECYSTECTOMY    ? EYE SURGERY    ? OVARIAN CYST REMOVAL    ? ?Family History  ?Adopted: Yes  ?Problem Relation Age of Onset  ? Diabetes Father   ? ?Social History  ? ?Tobacco Use  ? Smoking status: Never  ? Smokeless tobacco: Never  ?Vaping Use  ? Vaping Use: Never used  ?Substance Use Topics  ? Alcohol use: Yes  ?  Comment: seldom  ? Drug use: No  ? ?Current Outpatient Medications  ?Medication Sig  Dispense Refill  ? amphetamine-dextroamphetamine (ADDERALL) 20 MG tablet Take 20 mg by mouth 4 (four) times daily.    ? Azelastine HCl 137 MCG/SPRAY SOLN Place 137 mcg into both nostrils.    ? calcium carbonate

## 2021-10-10 NOTE — Patient Instructions (Signed)
You have been scheduled for a EGD. Please follow the written instructions given to you at your visit today. ?If you use inhalers (even only as needed), please bring them with you on the day of your procedure. ? ?Thank you for trusting me with your gastrointestinal care!   ? ?Willette Cluster, NP ? ? ? ?BMI: ? ?If you are age 45 or older, your body mass index should be between 23-30. Your Body mass index is 29.48 kg/m?Marland Kitchen If this is out of the aforementioned range listed, please consider follow up with your Primary Care Provider. ? ?If you are age 66 or younger, your body mass index should be between 19-25. Your Body mass index is 29.48 kg/m?Marland Kitchen If this is out of the aformentioned range listed, please consider follow up with your Primary Care Provider.  ? ?MY CHART: ? ?The [facility] providers would like to encourage you to use [facility] to communicate with providers for non-urgent requests or questions.  Due to long hold times on the telephone, sending your provider a message by [facility] may be a faster and more efficient way to get a response.  Please allow 48 business hours for a response.  Please remember that this is for non-urgent requests.  ? ?

## 2021-10-11 ENCOUNTER — Encounter: Payer: Self-pay | Admitting: Nurse Practitioner

## 2021-10-11 ENCOUNTER — Telehealth: Payer: Self-pay | Admitting: Diagnostic Neuroimaging

## 2021-10-11 NOTE — Telephone Encounter (Signed)
Because of her history of DM I and frequent DKA, dexamethasone is not a good medication for patient. I know that she tried Imitrex in the past, we can try another triptan if she is interested. Please let me know after talking to her. Thanks

## 2021-10-12 ENCOUNTER — Ambulatory Visit (AMBULATORY_SURGERY_CENTER): Payer: BC Managed Care – PPO | Admitting: Internal Medicine

## 2021-10-12 ENCOUNTER — Encounter: Payer: Self-pay | Admitting: Internal Medicine

## 2021-10-12 VITALS — BP 115/50 | HR 104 | Temp 98.0°F | Resp 12 | Ht 61.0 in | Wt 156.0 lb

## 2021-10-12 DIAGNOSIS — K2950 Unspecified chronic gastritis without bleeding: Secondary | ICD-10-CM | POA: Diagnosis not present

## 2021-10-12 DIAGNOSIS — R112 Nausea with vomiting, unspecified: Secondary | ICD-10-CM

## 2021-10-12 DIAGNOSIS — K2960 Other gastritis without bleeding: Secondary | ICD-10-CM

## 2021-10-12 DIAGNOSIS — R12 Heartburn: Secondary | ICD-10-CM

## 2021-10-12 DIAGNOSIS — K229 Disease of esophagus, unspecified: Secondary | ICD-10-CM | POA: Diagnosis not present

## 2021-10-12 MED ORDER — SUCRALFATE 1 GM/10ML PO SUSP: 1.0000 g | Freq: Four times a day (QID) | ORAL | 0 refills | Status: AC

## 2021-10-12 MED ORDER — SODIUM CHLORIDE 0.9 % IV SOLN: 500.0000 mL | Freq: Once | INTRAVENOUS | Status: DC

## 2021-10-12 MED ORDER — PANTOPRAZOLE SODIUM 40 MG PO TBEC: 40.0000 mg | DELAYED_RELEASE_TABLET | Freq: Two times a day (BID) | ORAL | 1 refills | Status: DC

## 2021-10-12 MED ORDER — SODIUM CHLORIDE 0.9 % IV SOLN
4.0000 mg | Freq: Once | INTRAVENOUS | Status: AC
  Administered 2021-10-12: 4 mg via INTRAVENOUS

## 2021-10-12 NOTE — Patient Instructions (Addendum)
Await pathology results. ?Use protonix (pantoprazole) 40 mg twice a day for 8 weeks, Use sucralfate suspension 1 gram four times daily for 4 weeks - sent both prescriptions to [facility] ?Return to GI clinic in 2 months  ? ?YOU HAD AN ENDOSCOPIC PROCEDURE TODAY AT [facility]:   Refer to the procedure report that was given to you for any specific questions about what was found during the examination.  If the procedure report does not answer your questions, please call your gastroenterologist to clarify.  If you requested that your care partner not be given the details of your procedure findings, then the procedure report has been included in a sealed envelope for you to review at your convenience later. ? ?YOU SHOULD EXPECT: Some feelings of bloating in the abdomen. Passage of more gas than usual.  Walking can help get rid of the air that was put into your GI tract during the procedure and reduce the bloating. If you had a lower endoscopy (such as a colonoscopy or flexible sigmoidoscopy) you may notice spotting of blood in your stool or on the toilet paper. If you underwent a bowel prep for your procedure, you may not have a normal bowel movement for a few days. ? ?Please Note:  You might notice some irritation and congestion in your nose or some drainage.  This is from the oxygen used during your procedure.  There is no need for concern and it should clear up in a day or so. ? ?SYMPTOMS TO REPORT IMMEDIATELY: ? ?Following upper endoscopy (EGD) ? Vomiting of blood or coffee ground material ? New chest pain or pain under the shoulder blades ? Painful or persistently difficult swallowing ? New shortness of breath ? Fever of 100?F or higher ? Black, tarry-looking stools ? ?For urgent or emergent issues, a gastroenterologist can be reached at any hour by calling [phone]. ?Do not use MyChart messaging for urgent concerns.  ? ? ?DIET:  We do recommend a small meal at first, but then you  may proceed to your regular diet.  Drink plenty of fluids but you should avoid alcoholic beverages for 24 hours. ? ?ACTIVITY:  You should plan to take it easy for the rest of today and you should NOT DRIVE or use heavy machinery until tomorrow (because of the sedation medicines used during the test).   ? ?FOLLOW UP: ?Our staff will call the number listed on your records 48-72 hours following your procedure to check on you and address any questions or concerns that you may have regarding the information given to you following your procedure. If we do not reach you, we will leave a message.  We will attempt to reach you two times.  During this call, we will ask if you have developed any symptoms of COVID 19. If you develop any symptoms (ie: fever, flu-like symptoms, shortness of breath, cough etc.) before then, please call [phone].  If you test positive for Covid 19 in the 2 weeks post procedure, please call and report this information to [place].   ? ?If any biopsies were taken you will be contacted by phone or by letter within the next 1-3 weeks.  Please call us at [phone] if you have not heard about the biopsies in 3 weeks.  ? ? ?SIGNATURES/CONFIDENTIALITY: ?You and/or your care partner have signed paperwork which will be entered into your electronic medical record.  These signatures attest to the fact that that the information above  on your After Visit Summary has been reviewed and is understood.  Full responsibility of the confidentiality of this discharge information lies with you and/or your care-partner.  ?

## 2021-10-12 NOTE — Progress Notes (Signed)
I agree with the assessment and plan as outlined by Ms. Guenther. 

## 2021-10-12 NOTE — Progress Notes (Signed)
Called to room to assist during endoscopic procedure.  Patient ID and intended procedure confirmed with present staff. Received instructions for my participation in the procedure from the performing physician.  

## 2021-10-12 NOTE — Progress Notes (Signed)
Pt's states no medical or surgical changes since previsit or office visit. VS assessed by C.W 

## 2021-10-12 NOTE — Progress Notes (Signed)
? ?GASTROENTEROLOGY PROCEDURE H&P NOTE  ? ?Primary Care Physician: ?Mindi Curling, PA-C ? ? ? ?Reason for Procedure:   N&V ? ?Plan:    EGD ? ?Patient is appropriate for endoscopic procedure(s) in the ambulatory ([place]) setting. ? ?The nature of the procedure, as well as the risks, benefits, and alternatives were carefully and thoroughly reviewed with the patient. Ample time for discussion and questions allowed. The patient understood, was satisfied, and agreed to proceed.  ? ? ? ?HPI: ?[name] is a 46 y.o. female who presents for EGD for evaluation of N&V .  Patient was most recently seen in the Gastroenterology Clinic on 10/10/21.  No interval change in medical history since that appointment. Please refer to that note for full details regarding GI history and clinical presentation.  ? ?Past Medical History:  ?Diagnosis Date  ? Adopted   ? Anxiety   ? Cluster headache syndrome, intractable   ? Depression   ? Depression   ? Migraine headache   ? Migraine without aura, with intractable migraine, so stated, without mention of status migrainosus 12/05/2012  ? Ovarian cyst   ? Sepsis ([place])   ? STD (sexually transmitted disease)   ? HSV II   ? Type I diabetes mellitus ([place])   ? ? ?Past Surgical History:  ?Procedure Laterality Date  ? APPENDECTOMY    ? BUNIONECTOMY Bilateral   ? CHOLECYSTECTOMY    ? EYE SURGERY    ? OVARIAN CYST REMOVAL    ? ? ?Prior to Admission medications   ?Medication Sig Start Date End Date Taking? Authorizing Provider  ?amphetamine-dextroamphetamine (ADDERALL) 20 MG tablet Take 20 mg by mouth 4 (four) times daily. 10/24/17  Yes [provider]  ?clonazePAM (KLONOPIN) 1 MG tablet Take 1 mg by mouth 3 (three) times daily as needed. 05/25/20  Yes [provider]  ?Continuous Blood Gluc Sensor (FREESTYLE LIBRE 3 SENSOR) MISC  09/15/21  Yes [provider]  ?FIASP PENFILL 100 UNIT/ML SOCT Inject 0-50 Units into the skin 3 (three) times daily as needed (high blood sugar).  10/14/20  Yes [provider]  ?Insulin Aspart, w/Niacinamide, (FIASP FLEXTOUCH) 100 UNIT/ML SOPN Inject 1-8 Units into the skin 3 (three) times daily. 09/19/18  Yes Eugenie Filler, MD  ?insulin degludec (TRESIBA) 100 UNIT/ML SOPN FlexTouch Pen Inject 24 Units into the skin at bedtime. 09/19/18  Yes [provider]  ?Insulin Pen Needle 31G X 5 MM MISC 1-8 Units by Does not apply route 3 (three) times daily. 09/19/18  Yes Eugenie Filler, MD  ?levonorgestrel (MIRENA) 20 MCG/24HR IUD 1 each by Intrauterine route once. Placed 06/2014   Yes [provider]  ?ondansetron (ZOFRAN-ODT) 4 MG disintegrating tablet Take 1 tablet (4 mg total) by mouth every 8 (eight) hours as needed for nausea or vomiting. 05/30/21  Yes Marcial Pacas, MD  ?pantoprazole (PROTONIX) 40 MG tablet Take 1 tablet (40 mg total) by mouth daily. 12/11/20  Yes Shawna Clamp, MD  ?Rimegepant Sulfate (NURTEC) 75 MG TBDP Take 75 mg by mouth as needed (take 1 at onset of headache, max is 1 tablet in 24 hours). 07/18/21  Yes Penumalli, Earlean Polka, MD  ?TRINTELLIX 20 MG TABS tablet Take 20 mg by mouth daily. 07/12/21  Yes [provider]  ?vortioxetine HBr (TRINTELLIX) 10 MG TABS tablet Take 20 mg by mouth daily.   Yes [provider]  ?zolpidem (AMBIEN CR) 12.5 MG CR tablet Take 12.5 mg by mouth at bedtime.  Yes [provider]  ?Azelastine HCl 137 MCG/SPRAY SOLN Place 137 mcg into both nostrils. ?Patient not taking: Reported on 10/12/2021 07/03/21   [provider]  ?calcium carbonate (TUMS - DOSED IN MG ELEMENTAL CALCIUM) 500 MG chewable tablet Chew 1 tablet by mouth daily as needed for indigestion or heartburn.    [provider]  ?dexamethasone (DECADRON) 2 MG tablet Take 3 tablets the first day, 2 the second and 1 the third day ?Patient not taking: Reported on 10/12/2021 05/28/21   Melvenia Beam, MD  ?Galcanezumab-gnlm [facility]) 120 MG/ML SOAJ Inject into skin every 30 days. (90 day  supply) ?Patient not taking: Reported on 10/12/2021 07/18/21   Penumalli, Earlean Polka, MD  ?Glucagon (BAQSIMI ONE PACK) 3 MG/DOSE POWD Place 3 mg into the nose. ?Patient not taking: Reported on 10/12/2021 02/24/21   [provider]  ?promethazine (PHENERGAN) 25 MG tablet Take 1 tablet (25 mg total) by mouth every 8 (eight) hours as needed for nausea or vomiting. ?Patient not taking: Reported on 10/12/2021 06/01/21   Britt Bottom, MD  ?dicyclomine (BENTYL) 20 MG tablet Take 1 tablet (20 mg total) by mouth 2 (two) times daily. ?Patient not taking: Reported on 12/20/2018 04/01/18 12/20/18  Drenda Freeze, MD  ?insulin aspart (NOVOLOG) 100 UNIT/ML injection Inject 0-9 Units into the skin 3 (three) times daily with meals. ?Patient not taking: Reported on 12/20/2018 09/19/18 12/20/18  Eugenie Filler, MD  ?Insulin Glargine [facility] KWIKPEN) 100 UNIT/ML SOPN Inject 0.22 mLs (22 Units total) into the skin daily. ?Patient not taking: Reported on 12/20/2018 09/19/18 12/20/18  Eugenie Filler, MD  ?misoprostol (CYTOTEC) 200 MCG tablet Place 2 tablets vaginally 6-12 hours prior to IUD insertion ?Patient not taking: Reported on 12/20/2018 08/25/18 12/20/18  Salvadore Dom, MD  ? ? ?Current Outpatient Medications  ?Medication Sig Dispense Refill  ? amphetamine-dextroamphetamine (ADDERALL) 20 MG tablet Take 20 mg by mouth 4 (four) times daily.    ? clonazePAM (KLONOPIN) 1 MG tablet Take 1 mg by mouth 3 (three) times daily as needed.    ? Continuous Blood Gluc Sensor (FREESTYLE LIBRE 3 SENSOR) MISC     ? FIASP PENFILL 100 UNIT/ML SOCT Inject 0-50 Units into the skin 3 (three) times daily as needed (high blood sugar).    ? Insulin Aspart, w/Niacinamide, (FIASP FLEXTOUCH) 100 UNIT/ML SOPN Inject 1-8 Units into the skin 3 (three) times daily. 5 pen 0  ? insulin degludec (TRESIBA) 100 UNIT/ML SOPN FlexTouch Pen Inject 24 Units into the skin at bedtime.    ? Insulin Pen Needle 31G X 5 MM MISC 1-8 Units by Does not apply route 3  (three) times daily. 100 each 0  ? levonorgestrel (MIRENA) 20 MCG/24HR IUD 1 each by Intrauterine route once. Placed 06/2014    ? ondansetron (ZOFRAN-ODT) 4 MG disintegrating tablet Take 1 tablet (4 mg total) by mouth every 8 (eight) hours as needed for nausea or vomiting. 20 tablet 3  ? pantoprazole (PROTONIX) 40 MG tablet Take 1 tablet (40 mg total) by mouth daily. 30 tablet 1  ? Rimegepant Sulfate (NURTEC) 75 MG TBDP Take 75 mg by mouth as needed (take 1 at onset of headache, max is 1 tablet in 24 hours). 12 tablet 11  ? TRINTELLIX 20 MG TABS tablet Take 20 mg by mouth daily.    ? vortioxetine HBr (TRINTELLIX) 10 MG TABS tablet Take 20 mg by mouth daily.    ? zolpidem (AMBIEN CR) 12.5 MG CR tablet  Take 12.5 mg by mouth at bedtime.    ? Azelastine HCl 137 MCG/SPRAY SOLN Place 137 mcg into both nostrils. (Patient not taking: Reported on 10/12/2021)    ? calcium carbonate (TUMS - DOSED IN MG ELEMENTAL CALCIUM) 500 MG chewable tablet Chew 1 tablet by mouth daily as needed for indigestion or heartburn.    ? dexamethasone (DECADRON) 2 MG tablet Take 3 tablets the first day, 2 the second and 1 the third day (Patient not taking: Reported on 10/12/2021) 6 tablet 3  ? Galcanezumab-gnlm (EMGALITY) 120 MG/ML SOAJ Inject into skin every 30 days. (90 day supply) (Patient not taking: Reported on 10/12/2021) 3 mL 3  ? Glucagon (BAQSIMI ONE PACK) 3 MG/DOSE POWD Place 3 mg into the nose. (Patient not taking: Reported on 10/12/2021)    ? promethazine (PHENERGAN) 25 MG tablet Take 1 tablet (25 mg total) by mouth every 8 (eight) hours as needed for nausea or vomiting. (Patient not taking: Reported on 10/12/2021) 12 tablet 0  ? ?Current Facility-Administered Medications  ?Medication Dose Route Frequency Provider Last Rate Last Admin  ? 0.9 %  sodium chloride infusion  500 mL Intravenous Once Sharyn Creamer, MD      ? ? ?Allergies as of 10/12/2021 - Review Complete 10/12/2021  ?Allergen Reaction Noted  ? Sulfa antibiotics Nausea And  Vomiting 12/17/2011  ? Wellbutrin [bupropion] Other (See Comments) 12/17/2011  ? Amoxicillin Rash 12/17/2011  ? Codeine Rash 12/17/2011  ? ? ?Family History  ?Adopted: Yes  ?Problem Relation Age of Onset  ? Sharma Covert

## 2021-10-12 NOTE — Op Note (Signed)
[facility] ?Patient Name: [name] ?Procedure Date: 10/12/2021 7:51 AM ?MRN: [id number] ?Endoscopist: [name] "[name] ,  ?Age: 46 ?Referring MD:  ?Date of Birth: 03-08-1976 ?Gender: Female ?Account #: [id number] ?Procedure:                Upper GI endoscopy ?Indications:              Heartburn, Nausea with vomiting ?Medicines:                Monitored Anesthesia Care ?Procedure:                Pre-Anesthesia Assessment: ?                          - Prior to the procedure, a History and Physical  ?                          was performed, and patient medications and  ?                          allergies were reviewed. The patient's tolerance of  ?                          previous anesthesia was also reviewed. The risks  ?                          and benefits of the procedure and the sedation  ?                          options and risks were discussed with the patient.  ?                          All questions were answered, and informed consent  ?                          was obtained. Prior Anticoagulants: The patient has  ?                          taken no previous anticoagulant or antiplatelet  ?                          agents. ASA Grade Assessment: II - A patient with  ?                          mild systemic disease. After reviewing the risks  ?                          and benefits, the patient was deemed in  ?                          satisfactory condition to undergo the procedure. ?                          After obtaining informed consent, the endoscope was  ?  passed under direct vision. Throughout the  ?                          procedure, the patient's blood pressure, pulse, and  ?                          oxygen saturations were monitored continuously. The  ?                          Endoscope was introduced through the mouth, and  ?                          advanced to the second part of duodenum. The upper  ?                          GI endoscopy was  accomplished without difficulty.  ?                          The patient tolerated the procedure well. ?Scope In: ?Scope Out: ?Findings:                 LA Grade A (one or more mucosal breaks less than 5  ?                          mm, not extending between tops of 2 mucosal folds)  ?                          esophagitis with no bleeding was found at the  ?                          gastroesophageal junction. ?                          Diffuse inflammation characterized by congestion  ?                          (edema) and erythema was found in the gastric body.  ?                          Biopsies were taken with a cold forceps for  ?                          histology (gastric mapping was performed with  ?                          biopsies taken from the gastric antrum/incisura and  ?                          then the gastric body). ?                          Localized mildly erythematous mucosa without active  ?  bleeding and with no stigmata of bleeding was found  ?                          in the duodenal bulb. Biopsies were taken with a  ?                          cold forceps for histology. ?Complications:            No immediate complications. ?Estimated Blood Loss:     Estimated blood loss was minimal. ?Impression:               - LA Grade A esophagitis with no bleeding. ?                          - Gastritis. Biopsied. ?                          - Erythematous duodenopathy. Biopsied. ?Recommendation:           - Discharge patient to home (with escort). ?                          - Await pathology results. ?                          - Use Protonix (pantoprazole) 40 mg PO BID for 8  ?                          weeks. ?                          - Use sucralfate suspension 1 gram PO QID for 4  ?                          weeks. ?                          - Return to GI clinic in 2 months. ?                          - The findings and recommendations were discussed  ?                           with the patient. ?[name] "[name]" [name],  ?10/12/2021 8:29:17 AM ?

## 2021-10-12 NOTE — Progress Notes (Signed)
VSS, transported to PACU °

## 2021-10-13 ENCOUNTER — Other Ambulatory Visit: Payer: Self-pay

## 2021-10-13 DIAGNOSIS — R112 Nausea with vomiting, unspecified: Secondary | ICD-10-CM

## 2021-10-13 MED ORDER — PANTOPRAZOLE SODIUM 40 MG PO TBEC: 40.0000 mg | DELAYED_RELEASE_TABLET | Freq: Two times a day (BID) | ORAL | 0 refills | Status: AC

## 2021-10-16 ENCOUNTER — Encounter: Payer: Self-pay | Admitting: Internal Medicine

## 2021-10-16 ENCOUNTER — Telehealth: Payer: Self-pay

## 2021-10-16 NOTE — Telephone Encounter (Signed)
?  Follow up Call- ? ? ?  10/12/2021  ?  7:14 AM  ?Call back number  ?Post procedure Call Back phone  # [phone]  ?Permission to leave phone message Yes  ?  ? ?Patient questions: ? ?Do you have a fever, pain , or abdominal swelling? No. ?Pain Score  0 * ? ?Have you tolerated food without any problems? Yes.   ? ?Have you been able to return to your normal activities? Yes.   ? ?Do you have any questions about your discharge instructions: ?Diet   No. ?Medications  No. ?Follow up visit  No. ? ?Do you have questions or concerns about your Care? No. ? ?Actions: ?* If pain score is 4 or above: ?No action needed, pain <4. ? ?Have you developed a fever since your procedure? no ? ?2.   Have you had an respiratory symptoms (SOB or cough) since your procedure? no ? ?3.   Have you tested positive for COVID 19 since your procedure no ? ?4.   Have you had any family members/close contacts diagnosed with the COVID 19 since your procedure?  no ? ? ?If yes to any of these questions please route to Laverna Peace, RN and Karlton Lemon, RN  ? ? ?

## 2021-10-17 MED ORDER — DEXAMETHASONE 2 MG PO TABS: ORAL_TABLET | ORAL | 3 refills | Status: AC

## 2021-10-23 NOTE — Telephone Encounter (Signed)
Error

## 2021-10-24 NOTE — Telephone Encounter (Signed)
Spoke with pt and let her know sucralfate was at pharmacy and let her know to contact us if she is still unable to pick up medication. Pt also requested results from procedure. Results sent to pt's MyChart.  ?

## 2021-11-30 DEATH — deceased

## 2021-12-13 ENCOUNTER — Ambulatory Visit: Payer: BC Managed Care – PPO | Admitting: Internal Medicine

## 2022-01-22 ENCOUNTER — Encounter: Payer: Self-pay | Admitting: Internal Medicine
